# Patient Record
Sex: Female | Born: 1975
Health system: Southern US, Community
[De-identification: ages and names within clinical notes are randomized; demographics above are authoritative.]

## PROBLEM LIST (undated history)

## (undated) DIAGNOSIS — I639 Cerebral infarction, unspecified: Secondary | ICD-10-CM

---

## 2004-10-09 DIAGNOSIS — J45909 Unspecified asthma, uncomplicated: Secondary | ICD-10-CM | POA: Insufficient documentation

## 2020-01-26 ENCOUNTER — Emergency Department (HOSPITAL_COMMUNITY): Payer: Medicaid Other

## 2020-01-26 ENCOUNTER — Inpatient Hospital Stay (HOSPITAL_COMMUNITY): Payer: Medicaid Other

## 2020-01-26 ENCOUNTER — Inpatient Hospital Stay (HOSPITAL_COMMUNITY)
Admission: EM | Admit: 2020-01-26 | Discharge: 2020-01-30 | DRG: 065 | Disposition: A | Discharge status: Home / Self Care | Payer: Medicaid Other | Attending: Internal Medicine | Admitting: Internal Medicine

## 2020-01-26 ENCOUNTER — Other Ambulatory Visit (HOSPITAL_COMMUNITY): Payer: Self-pay

## 2020-01-26 ENCOUNTER — Encounter (HOSPITAL_COMMUNITY): Payer: Self-pay

## 2020-01-26 DIAGNOSIS — Z20822 Contact with and (suspected) exposure to covid-19: Secondary | ICD-10-CM | POA: Diagnosis present

## 2020-01-26 DIAGNOSIS — R29810 Facial weakness: Secondary | ICD-10-CM | POA: Diagnosis present

## 2020-01-26 DIAGNOSIS — F17210 Nicotine dependence, cigarettes, uncomplicated: Secondary | ICD-10-CM | POA: Diagnosis present

## 2020-01-26 DIAGNOSIS — R471 Dysarthria and anarthria: Secondary | ICD-10-CM | POA: Diagnosis not present

## 2020-01-26 DIAGNOSIS — G8194 Hemiplegia, unspecified affecting left nondominant side: Secondary | ICD-10-CM | POA: Diagnosis not present

## 2020-01-26 DIAGNOSIS — Z6835 Body mass index (BMI) 35.0-35.9, adult: Secondary | ICD-10-CM

## 2020-01-26 DIAGNOSIS — Z8249 Family history of ischemic heart disease and other diseases of the circulatory system: Secondary | ICD-10-CM

## 2020-01-26 DIAGNOSIS — R739 Hyperglycemia, unspecified: Secondary | ICD-10-CM | POA: Diagnosis present

## 2020-01-26 DIAGNOSIS — R531 Weakness: Secondary | ICD-10-CM

## 2020-01-26 DIAGNOSIS — G4733 Obstructive sleep apnea (adult) (pediatric): Secondary | ICD-10-CM | POA: Diagnosis present

## 2020-01-26 DIAGNOSIS — Z7982 Long term (current) use of aspirin: Secondary | ICD-10-CM

## 2020-01-26 DIAGNOSIS — E785 Hyperlipidemia, unspecified: Secondary | ICD-10-CM | POA: Diagnosis present

## 2020-01-26 DIAGNOSIS — I10 Essential (primary) hypertension: Secondary | ICD-10-CM | POA: Diagnosis not present

## 2020-01-26 DIAGNOSIS — E039 Hypothyroidism, unspecified: Secondary | ICD-10-CM | POA: Diagnosis present

## 2020-01-26 DIAGNOSIS — R569 Unspecified convulsions: Secondary | ICD-10-CM | POA: Diagnosis not present

## 2020-01-26 DIAGNOSIS — I63512 Cerebral infarction due to unspecified occlusion or stenosis of left middle cerebral artery: Secondary | ICD-10-CM | POA: Diagnosis not present

## 2020-01-26 DIAGNOSIS — Z833 Family history of diabetes mellitus: Secondary | ICD-10-CM

## 2020-01-26 DIAGNOSIS — Z720 Tobacco use: Secondary | ICD-10-CM | POA: Diagnosis not present

## 2020-01-26 DIAGNOSIS — R29709 NIHSS score 9: Secondary | ICD-10-CM | POA: Diagnosis not present

## 2020-01-26 DIAGNOSIS — E669 Obesity, unspecified: Secondary | ICD-10-CM | POA: Diagnosis present

## 2020-01-26 DIAGNOSIS — F17200 Nicotine dependence, unspecified, uncomplicated: Secondary | ICD-10-CM | POA: Diagnosis present

## 2020-01-26 DIAGNOSIS — E7800 Pure hypercholesterolemia, unspecified: Secondary | ICD-10-CM | POA: Diagnosis not present

## 2020-01-26 DIAGNOSIS — Z79899 Other long term (current) drug therapy: Secondary | ICD-10-CM

## 2020-01-26 DIAGNOSIS — R4701 Aphasia: Secondary | ICD-10-CM | POA: Diagnosis present

## 2020-01-26 DIAGNOSIS — Z8673 Personal history of transient ischemic attack (TIA), and cerebral infarction without residual deficits: Secondary | ICD-10-CM

## 2020-01-26 DIAGNOSIS — E6609 Other obesity due to excess calories: Secondary | ICD-10-CM | POA: Diagnosis not present

## 2020-01-26 DIAGNOSIS — I639 Cerebral infarction, unspecified: Secondary | ICD-10-CM

## 2020-01-26 DIAGNOSIS — I6389 Other cerebral infarction: Secondary | ICD-10-CM | POA: Diagnosis not present

## 2020-01-26 DIAGNOSIS — I6930 Unspecified sequelae of cerebral infarction: Secondary | ICD-10-CM | POA: Diagnosis present

## 2020-01-26 DIAGNOSIS — Z9114 Patient's other noncompliance with medication regimen: Secondary | ICD-10-CM

## 2020-01-26 HISTORY — DX: Cerebral infarction, unspecified: I63.9

## 2020-01-26 LAB — CBC
HCT: 46.2 % — ABNORMAL HIGH (ref 36.0–46.0)
Hemoglobin: 14.9 g/dL (ref 12.0–15.0)
MCH: 29 pg (ref 26.0–34.0)
MCHC: 32.3 g/dL (ref 30.0–36.0)
MCV: 90.1 fL (ref 80.0–100.0)
Platelets: 326 10*3/uL (ref 150–400)
RBC: 5.13 MIL/uL — ABNORMAL HIGH (ref 3.87–5.11)
RDW: 12.3 % (ref 11.5–15.5)
WBC: 6.1 10*3/uL (ref 4.0–10.5)
nRBC: 0 % (ref 0.0–0.2)

## 2020-01-26 LAB — COMPREHENSIVE METABOLIC PANEL
ALT: 20 U/L (ref 0–44)
AST: 20 U/L (ref 15–41)
Albumin: 4 g/dL (ref 3.5–5.0)
Alkaline Phosphatase: 101 U/L (ref 38–126)
Anion gap: 9 (ref 5–15)
BUN: 9 mg/dL (ref 6–20)
CO2: 25 mmol/L (ref 22–32)
Calcium: 9.9 mg/dL (ref 8.9–10.3)
Chloride: 105 mmol/L (ref 98–111)
Creatinine, Ser: 0.99 mg/dL (ref 0.44–1.00)
GFR calc Af Amer: >60 mL/min (ref >60)
GFR calc non Af Amer: >60 mL/min (ref >60)
Glucose, Bld: 124 mg/dL — ABNORMAL HIGH (ref 70–99)
Potassium: 4.1 mmol/L (ref 3.5–5.1)
Sodium: 139 mmol/L (ref 135–145)
Total Bilirubin: 0.4 mg/dL (ref 0.3–1.2)
Total Protein: 8 g/dL (ref 6.5–8.1)

## 2020-01-26 LAB — DIFFERENTIAL
Abs Immature Granulocytes: 0.02 10*3/uL (ref 0.00–0.07)
Basophils Absolute: 0 10*3/uL (ref 0.0–0.1)
Basophils Relative: 1 %
Eosinophils Absolute: 0.2 10*3/uL (ref 0.0–0.5)
Eosinophils Relative: 4 %
Immature Granulocytes: 0 %
Lymphocytes Relative: 39 %
Lymphs Abs: 2.4 10*3/uL (ref 0.7–4.0)
Monocytes Absolute: 0.3 10*3/uL (ref 0.1–1.0)
Monocytes Relative: 4 %
Neutro Abs: 3.2 10*3/uL (ref 1.7–7.7)
Neutrophils Relative %: 52 %

## 2020-01-26 LAB — CBG MONITORING, ED: Glucose-Capillary: 111 mg/dL — ABNORMAL HIGH (ref 70–99)

## 2020-01-26 LAB — SARS CORONAVIRUS 2 BY RT PCR (HOSPITAL ORDER, PERFORMED IN ~~LOC~~ HOSPITAL LAB): SARS Coronavirus 2: NEGATIVE

## 2020-01-26 LAB — APTT: aPTT: 28 seconds (ref 24–36)

## 2020-01-26 LAB — PROTIME-INR
INR: 1.1 (ref 0.8–1.2)
Prothrombin Time: 13.4 seconds (ref 11.4–15.2)

## 2020-01-26 LAB — I-STAT CHEM 8, ED
BUN: 11 mg/dL (ref 6–20)
Calcium, Ion: 1.11 mmol/L — ABNORMAL LOW (ref 1.15–1.40)
Chloride: 107 mmol/L (ref 98–111)
Creatinine, Ser: 1 mg/dL (ref 0.44–1.00)
Glucose, Bld: 119 mg/dL — ABNORMAL HIGH (ref 70–99)
HCT: 46 % (ref 36.0–46.0)
Hemoglobin: 15.6 g/dL — ABNORMAL HIGH (ref 12.0–15.0)
Potassium: 4.4 mmol/L (ref 3.5–5.1)
Sodium: 139 mmol/L (ref 135–145)
TCO2: 23 mmol/L (ref 22–32)

## 2020-01-26 LAB — I-STAT BETA HCG BLOOD, ED (MC, WL, AP ONLY): I-stat hCG, quantitative: <5 m[IU]/mL (ref <5)

## 2020-01-26 MED ORDER — STROKE: EARLY STAGES OF RECOVERY BOOK
Freq: Once | Status: AC
  Filled 2020-01-26: qty 1

## 2020-01-26 MED ORDER — ACETAMINOPHEN 325 MG PO TABS
650.0000 mg | ORAL_TABLET | ORAL | Status: DC | PRN
  Administered 2020-01-26 – 2020-01-28 (×3): 650 mg via ORAL
  Filled 2020-01-26 (×3): qty 2

## 2020-01-26 MED ORDER — SODIUM CHLORIDE 0.9% FLUSH
3.0000 mL | Freq: Once | INTRAVENOUS | Status: AC
  Administered 2020-01-27: 3 mL via INTRAVENOUS

## 2020-01-26 MED ORDER — LORAZEPAM 1 MG PO TABS
1.0000 mg | ORAL_TABLET | Freq: Once | ORAL | Status: AC
  Administered 2020-01-26: 1 mg via ORAL
  Filled 2020-01-26: qty 1

## 2020-01-26 MED ORDER — ASPIRIN 81 MG PO CHEW
81.0000 mg | CHEWABLE_TABLET | Freq: Every day | ORAL | Status: DC
  Administered 2020-01-27 – 2020-01-30 (×4): 81 mg via ORAL
  Filled 2020-01-26 (×5): qty 1

## 2020-01-26 MED ORDER — ENOXAPARIN SODIUM 40 MG/0.4ML ~~LOC~~ SOLN: 40.0000 mg | SUBCUTANEOUS | Status: DC

## 2020-01-26 MED ORDER — NICOTINE 14 MG/24HR TD PT24
14.0000 mg | MEDICATED_PATCH | Freq: Every day | TRANSDERMAL | Status: DC
  Administered 2020-01-27 – 2020-01-30 (×4): 14 mg via TRANSDERMAL
  Filled 2020-01-26 (×4): qty 1

## 2020-01-26 MED ORDER — ATORVASTATIN CALCIUM 80 MG PO TABS
80.0000 mg | ORAL_TABLET | Freq: Every day | ORAL | Status: DC
  Administered 2020-01-27: 80 mg via ORAL
  Filled 2020-01-26: qty 1

## 2020-01-26 MED ORDER — ACETAMINOPHEN 160 MG/5ML PO SOLN: 650.0000 mg | ORAL | Status: DC | PRN

## 2020-01-26 MED ORDER — ACETAMINOPHEN 650 MG RE SUPP: 650.0000 mg | RECTAL | Status: DC | PRN

## 2020-01-26 MED ORDER — IOHEXOL 350 MG/ML SOLN
100.0000 mL | Freq: Once | INTRAVENOUS | Status: AC | PRN
  Administered 2020-01-26: 100 mL via INTRAVENOUS

## 2020-01-26 MED ORDER — ASPIRIN 81 MG PO CHEW
324.0000 mg | CHEWABLE_TABLET | Freq: Once | ORAL | Status: AC
  Administered 2020-01-26: 324 mg via ORAL
  Filled 2020-01-26: qty 4

## 2020-01-26 NOTE — H&P (Signed)
Date: 01/26/2020               Patient Name:  Maria Porter MRN: [id number]  DOB: 28-Jan-1976 Age / Sex: 44 y.o., female   PCP: Patient, No Pcp Per         Medical Service: Internal Medicine Teaching Service         Attending Physician: Dr. Oswaldo Done, Marquita Palms, *    First Contact: Dr. Quincy Simmonds Pager: [phone]  Second Contact: Dr. Dellia Cloud Pager: [phone]       After Hours (After 5p/  First Contact Pager: [phone]  weekends / holidays): Second Contact Pager: [phone]   Chief Complaint: right sided weakness  History of Present Illness: [name] is a 44 year old female with history of prior stroke and ovarian tumor s/p resection presenting with acute onset right sided weakness and aphasia starting approximately four hours prior to presentation. She notes that she was in her usual state of health and was taking a shower when she had sudden onset of right sided weakness (upper > lower) with slurred speech and right sided facial droop. She denies any associated headache, vision changes, chest pain, dyspnea, gait instability or loss of balance.  She notes that symptoms persisted until evaluation in the ED when her aphasia started to improve, although notes some ongoing dysarthria and mild right upper extremity weakness.   Of note, patient notes similar episode approximately 4-5 years ago when she was living in [place], [place]. At the time, she was discharged with aspirin. She endorses ongoing left hand paresthesias from her prior stroke.   ED Course: Patient evaluated at code stroke and neurology was consulted. CT Head wo Contrast demonstrated subacute left frontal stroke with mild local mass effect without midline shift. Patient admitted for further stroke work up.   Meds:  Current Meds  Medication Sig  . albuterol (VENTOLIN HFA) 108 (90 Base) MCG/ACT inhaler Inhale 1-2 puffs into the lungs every 6 (six) hours as needed for wheezing or shortness of breath.  Marland Kitchen  aspirin EC 81 MG tablet Take 81 mg by mouth daily. Swallow whole.  . Black Elderberry,Berry-Flower, 575 MG CAPS Take 575 mg by mouth daily.  Marland Kitchen ibuprofen (ADVIL) 200 MG tablet Take 600 mg by mouth every 6 (six) hours as needed for headache or moderate pain.  . Multiple Vitamins-Minerals (MULTIVITAMIN WITH MINERALS) tablet Take 1 tablet by mouth daily.    Allergies: Allergies as of 01/26/2020  . (No Known Allergies)   Past Medical History:  Diagnosis Date  . Stroke [facility])     Family History:  Patient endorses family history of heart disease in both mother and father. Mother also has history of diabetes. Maternal and paternal grandmothers with history of stroke. Sister has history of hypertension.  Social History:  Patient works as an Public house manager. She is currently residing with her sister. She has significant history of smoking 1ppd for 30 years. She   Surgical History: Ovarian tumor removal; laterality unknown  Tubal ligation   Review of Systems: A complete ROS was negative except as per HPI.   Physical Exam: Blood pressure 121/89, pulse 63, temperature (!) 97.5 F (36.4 C), temperature source Oral, resp. rate 18, SpO2 99 %. Physical Exam  Constitutional: Appears well-developed and well-nourished. No distress.  HENT: Normocephalic and atraumatic, EOMI, conjunctiva normal, moist mucous membranes Cardiovascular: Normal rate, regular rhythm, S1 and S2 present, no murmurs, rubs, gallops.  Distal pulses intact Respiratory: No respiratory distress, no accessory  muscle use.  Effort is normal.  Lungs are clear to auscultation bilaterally. GI: Nondistended, soft, nontender to palpation, normal active bowel sounds Musculoskeletal: Normal bulk and tone.  No peripheral edema noted. Neurological: Mental Status: Patient is awake, alert, oriented x4 No signs of aphasia or neglect Cranial Nerves: II: Pupils equal, round, and reactive to light.   III,IV, VI: EOMI without ptosis or diploplia.  V:  Facial sensation diminished to light touch in V2 and V3 distribution on R side.  VII: Facial movement is symmetric.  VIII: hearing is intact to voice X: Uvula elevates symmetrically XI: Shoulder shrug is symmetric. XII: tongue is midline without atrophy or fasciculations.  Motor: 3/5 in RUE and 5/5 in LUE; 4/5 in RLE and 5/5 in LLE Sensory: Sensation is grossly intact bilateral UEs & LEs Cerebellar: Finger-Nose and Heel-Shin intact bilaterally; although slightly delayed on right than left.  Skin: Warm and dry.  No rash, erythema, lesions noted.  EKG: personally reviewed my interpretation is mild ST segment elevation in II and II and aVF; T wave inversions in aVR and aVL; QTc 436  CT HEAD WO CONTRAST:  IMPRESSION: 1. Findings concerning for subacute infarct involving the left frontal lobe, as detailed above. Mild local mass effect without midline shift. No hemorrhagic transformation.  CTA HEAD AND NECK AND PERFUSION WITH CONTRAST:  IMPRESSION: 1. Normal variant CTA Circle of Willis without significant proximal stenosis, aneurysm, or branch vessel occlusion. 2. Normal CTA of the neck. 3. CT perfusion is normal.  Assessment & Plan by Problem: Active Problems:   Acute CVA (cerebrovascular accident) [facility])  [name] 44 year old female with PMHx of prior L frontal CVA w/ongoing paresthesias in left hand, tobacco use disorder, and prior ovarian tumor s/p resection presenting with acute onset right sided weakness and aphasia admitted for stroke work up.  Acute CVA vs TIA:  Patient presents with acute onset right sided weakness and aphasia with right facial droop. CT Head wo contrast with subacute infarct of left frontal lobe without any further findings. She noted improvement in symptoms overall but continued to have some weakness in right upper and lower extremity on examination. Mildly diminished sensation in the right V2 and V3 segments without any other sensory deficits on  examination. Cerebellar examination wnl. Patient does have significant risk factors including prior stroke for which she is only on aspirin and tobacco use disorder.  - Neurology consulted, appreciate their recommendations - F/u MRI brain with MRA head and neck  - F/u Echo - Lipid panel and HbA1c  - Aspirin 81mg  daily - Permissive HTN in first 24 hours (goal SBP <220, DBP<110) - Cardiac monitoring - PT/OT/SLP evaluation  - F/u EEG   Hyperglycemia: No known history of diabetes. CBGs > 100 on admission - CBG monitoring and SSI - HbA1c  Tobacco use disorder:  Patient endorses 30 pack year smoking history. Discussed increased risk of stroke with smoking. Currently in precontemplative stage. - Nicotine patch - Continue to encourage smoking cessation  Hypertension: Patient not on any antihypertensives at home. Noted to have SBP 140-150sand DBP 90-110 during evaluation.  - Permissive HTN in first 24 hours given acute CVA - Goal is normotensive on discharge   Nonspecific EKG changes: Mild ST segment elevation and T wave inversions on EKG without prior EKG for comparison. No chest pain and HEART score of 3.Will hold off on getting troponins.  - Repeat EKG in AM  Diet: HH Fluids/Electrolytes: Monitor and replete prn DVT Prophylaxis: Lovenox  Code status: FULL   Dispo: Admit patient to Observation with expected length of stay less than 2 midnights.  Signed: Eliezer Bottom, MD  [facility] PGY-2 01/26/2020, 6:22 PM  Pager: [phone] After 5pm on weekdays and 1pm on weekends: On Call pager: [phone]

## 2020-01-26 NOTE — Procedures (Signed)
[name]  MRN: [id number]  Epilepsy Attending: Charlsie Quest  Referring Physician/Provider: Dr Erick Blinks Date: 01/26/2020 Duration: 25.22mins  Patient history: 44 y.o. female with PMH significant for prior L frontal stroke who presents with acute onset aphasia + R sided weakness. EEG to evaluate for seizure.  Level of alertness: Awake  AEDs during EEG study: None  Technical aspects: This EEG study was done with scalp electrodes positioned according to the 10-20 International system of electrode placement. Electrical activity was acquired at a sampling rate of 500Hz  and reviewed with a high frequency filter of 70Hz  and a low frequency filter of 1Hz . EEG data were recorded continuously and digitally stored.   Description: The posterior dominant rhythm consists of 8-9 Hz activity of moderate voltage (25-35 uV) seen predominantly in posterior head regions, symmetric and reactive to eye opening and eye closing.  EEG showed intermittent 3 to 6 Hz theta-delta slowing in left frontotemporal region.  Hyperventilation and photic stimulation were not performed.     ABNORMALITY -Intermittent slow, left frontotemporal region  IMPRESSION: This study is suggestive of cortical dysfunction in left frontotemporal region likely secondary to underlying stroke. No seizures or epileptiform discharges were seen throughout the recording.  Dash Cardarelli 

## 2020-01-26 NOTE — ED Notes (Signed)
Pt transported to MRI 

## 2020-01-26 NOTE — Code Documentation (Signed)
Patient from home where she lives with her sister. Sister saw patient in her normal state of health at 1215 and then went to eat lunch. Pt got in the shower and noticed right sided weakness and tingling in her arm and face. She says she was not able to get words out. Pt went to find sister and sister called 911. [facility] arrived and found pt globally aphasic, right sided hemiparesis, with a facial droop. Pt was taken to [facility] as a code stroke. Her initial NIHSS was a 9 (see documentation). She was cleared for CT. A CT/CTA/CTP were completed. During this time pt started to improve. Her NIHSS is now a 3 with some slurred speech and drift in the right upper and lower extremities. Pt is now able to verbally respond and follow all commands. She answers questions appropriately. Pt has had a stroke in the past with only numbness in her left hip as a remaining deficit. Because pt is rapidly improving it was decided not to give TPA. Her other scans were negative for any LVO's or bleeds per MD. Care Plan: q15 vitals, q30 mNIHSS until outside the TPA window at 1645. Hand off with Jeannett Senior, RN.

## 2020-01-26 NOTE — ED Notes (Addendum)
Received report from previous RN Hannie. Patient not in room at this time.

## 2020-01-26 NOTE — ED Notes (Signed)
Patient back from MRI.

## 2020-01-26 NOTE — Procedures (Signed)
  Please contact on call cardiology for stat echo to be done after hours.

## 2020-01-26 NOTE — ED Provider Notes (Addendum)
[facility] EMERGENCY DEPARTMENT Provider Note   CSN: [id number] Arrival date & time: 01/26/20  1354  An emergency department physician performed an initial assessment on this suspected stroke patient at 1356.  History Chief Complaint  Patient presents with  . Code Stroke    Maria Porter is a 44 y.o. female presenting today as a Code Stroke, via EMS. Per patient, she was taking a shower this afternoon around (last known well) 12:15 pm today when she noticed that her right side suddenly became weak, she was drooling on herself, was unable to speak clearly, and had numbness on her right side. She denies LOC, did not fall while in the shower; she was able to get out of the shower and get her daughter's attention. She states she has been feeling well otherwise.  She denies HA, chest pain, SOB, visual changes or loss of vision.   Her sister is at the bedside. Patient reports that she had a similar episode 4 years ago while living in [place], [place]. She states that at that time she was told she had no blood clots in her brain, and did not have any lingering deficits afterward. She was discharged home with instruction to take aspirin daily, which she has done.  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 She is new to the [place] area; she also just moved into her own apartment. According to history offered by patient's sister at bedside, patient was also under increased stress at the time of her prior episode.    She reports she is feeling much better at this time. She is alert and appropriate, speech remains mildly slurred.   HPI     Past Medical History:  Diagnosis Date  . Stroke [facility])     There are no problems to display for this patient.   History reviewed. No pertinent surgical history.   OB History   No obstetric history on file.     No family history on file.  Social History   Tobacco Use  .  Smoking status: Not on file  Substance Use Topics  . Alcohol use: Not on file  . Drug use: Not on file    Home Medications Prior to Admission medications   Medication Sig Start Date End Date Taking? Authorizing Provider  albuterol (VENTOLIN HFA) 108 (90 Base) MCG/ACT inhaler Inhale 1-2 puffs into the lungs every 6 (six) hours as needed for wheezing or shortness of breath.   Yes [provider]  aspirin EC 81 MG tablet Take 81 mg by mouth daily. Swallow whole.   Yes [provider]  Black Elderberry,Berry-Flower, 575 MG CAPS Take 575 mg by mouth daily.   Yes [provider]  ibuprofen (ADVIL) 200 MG tablet Take 600 mg by mouth every 6 (six) hours as needed for headache or moderate pain.   Yes [provider]  Multiple Vitamins-Minerals (MULTIVITAMIN WITH MINERALS) tablet Take 1 tablet by mouth daily.   Yes [provider]    Allergies    Patient has no known allergies.  Review of Systems   Review of Systems  Constitutional: Negative for chills and fever.  HENT: Positive for drooling. Negative for ear pain, hearing loss, trouble swallowing and voice change.   Eyes: Negative for photophobia, pain and visual disturbance.  Respiratory: Negative for cough, chest tightness and shortness of breath.   Cardiovascular: Negative for chest pain, palpitations and leg swelling.  Gastrointestinal:  Negative for abdominal pain, nausea and vomiting.  Genitourinary: Negative for dysuria and hematuria.  Musculoskeletal: Positive for gait problem. Negative for arthralgias and back pain.  Skin: Negative for color change and rash.  Neurological: Positive for speech difficulty and weakness. Negative for dizziness, seizures, syncope, light-headedness and headaches.  Psychiatric/Behavioral: Negative for confusion.  All other systems reviewed and are negative.   Physical Exam Updated Vital Signs BP (!) 140/93   Pulse 72   Temp (!) 97.5 F (36.4 C) (Oral)    Resp 19   SpO2 99%   Physical Exam Vitals and nursing note reviewed.  HENT:     Head: Normocephalic and atraumatic.     Mouth/Throat:     Mouth: Mucous membranes are moist.     Pharynx: No oropharyngeal exudate or posterior oropharyngeal erythema.  Eyes:     General: Vision grossly intact. No visual field deficit.       Right eye: No discharge.        Left eye: No discharge.     Extraocular Movements: Extraocular movements intact.     Conjunctiva/sclera: Conjunctivae normal.     Pupils: Pupils are equal, round, and reactive to light.     Visual Fields: Right eye visual fields normal and left eye visual fields normal.  Neck:     Comments: Mild slurring of speech Cardiovascular:     Rate and Rhythm: Normal rate and regular rhythm.     Pulses: Normal pulses.     Heart sounds: Normal heart sounds.  Pulmonary:     Effort: Pulmonary effort is normal. No respiratory distress.     Breath sounds: Normal breath sounds. No wheezing or rales.  Abdominal:     General: Bowel sounds are normal. There is no distension.     Palpations: Abdomen is soft.     Tenderness: There is no abdominal tenderness.  Musculoskeletal:        General: No deformity.     Cervical back: Neck supple.     Right lower leg: No edema.     Left lower leg: No edema.  Skin:    General: Skin is warm and dry.     Capillary Refill: Capillary refill takes less than 2 seconds.  Neurological:     Mental Status: She is alert and oriented to person, place, and time.     Cranial Nerves: Cranial nerves are intact.     Sensory: Sensation is intact.     Motor: Weakness present.     Coordination: Coordination abnormal.     Comments: Delay in finger to nose exam on the right; left intact.  Delay in heel to shin exam with right leg; left intact.  Strength asymmetric R>L.  RUE 5/5 strength, RLE 5/5 strength Mild left sided weakness.LUE 4/5 strength, LLE 4/5 strength  Romberg and pronator drift not performed as patient  presented with left sided weakness  Psychiatric:        Mood and Affect: Mood normal.     ED Results / Procedures / Treatments   Labs (all labs ordered are listed, but only abnormal results are displayed) Labs Reviewed  CBC - Abnormal; Notable for the following components:      Result Value   RBC 5.13 (*)    HCT 46.2 (*)    All other components within normal limits  COMPREHENSIVE METABOLIC PANEL - Abnormal; Notable for the following components:   Glucose, Bld 124 (*)    All other components within normal limits  I-STAT  CHEM 8, ED - Abnormal; Notable for the following components:   Glucose, Bld 119 (*)    Calcium, Ion 1.11 (*)    Hemoglobin 15.6 (*)    All other components within normal limits  CBG MONITORING, ED - Abnormal; Notable for the following components:   Glucose-Capillary 111 (*)    All other components within normal limits  SARS CORONAVIRUS 2 BY RT PCR (HOSPITAL ORDER, PERFORMED IN [facility])  PROTIME-INR  APTT  DIFFERENTIAL  I-STAT BETA HCG BLOOD, ED ([facility], [facility], [facility] ONLY)    EKG None  Radiology CT CEREBRAL PERFUSION W CONTRAST  Result Date: 01/26/2020 CLINICAL DATA:  Neuro deficit, acute, stroke suspected. EXAM: CT ANGIOGRAPHY HEAD AND NECK CT PERFUSION BRAIN TECHNIQUE: Multidetector CT imaging of the head and neck was performed using the standard protocol during bolus administration of intravenous contrast. Multiplanar CT image reconstructions and MIPs were obtained to evaluate the vascular anatomy. Carotid stenosis measurements (when applicable) are obtained utilizing NASCET criteria, using the distal internal carotid diameter as the denominator. Multiphase CT imaging of the brain was performed following IV bolus contrast injection. Subsequent parametric perfusion maps were calculated using RAPID software. CONTRAST:  OMNIPAQUE IOHEXOL 350 MG/ML SOLN COMPARISON:  CT head without contrast 01/26/2020 FINDINGS: CTA NECK FINDINGS Aortic arch: Common  origin of the left common carotid artery and innominate artery is noted. No significant stenosis or vascular disease present the arch or great vessel origins. Right carotid system: Right common carotid artery is within normal limits. Bifurcation is unremarkable. Cervical right ICA is within normal limits. Left carotid system: Of the left common carotid artery is within normal limits. Bifurcation is unremarkable. Cervical left ICA is normal. Vertebral arteries: The right vertebral artery is the dominant vessel. Both vertebral arteries originate from the subclavians without significant stenosis. No significant stenosis is present in either vertebral artery in the neck. Skeleton: There is some straightening of the normal cervical lordosis. No significant stenosis is present in either vertebral artery in the neck. Other neck: Soft tissues the neck are otherwise unremarkable. No significant adenopathy is present. Thyroid is normal. Salivary glands are within normal limits. Upper chest: Mild the lung apices are clear. Thoracic inlet is within normal limits. Review of the MIP images confirms the above findings CTA HEAD FINDINGS Anterior circulation: The internal carotid arteries are within normal limits from the high cervical segments through the ICA termini scratched at the internal carotid arteries are within normal limits from the skull base through the ICA termini. The A1 and M1 segments are normal. The anterior communicating artery is patent. The MCA bifurcations are intact. The ACA and MCA branch vessels are within normal limits. Posterior circulation: Right vertebral artery is the dominant left Sol. PICA origins are visualized and normal. Basilar artery is within normal limits. The right posterior cerebral artery arises from the basilar tip. The left posterior cerebral artery is of fetal type. PCA branch vessels are within normal limits bilaterally. Venous sinuses: The dural sinuses are patent. The straight sinus  and deep cerebral veins are intact. Cortical veins are unremarkable. Anatomic variants: Fetal type left posterior cerebral artery. Review of the MIP images confirms the above findings CT Brain Perfusion Findings: ASPECTS: 9/10 CBF (<30%) Volume: 22mL Perfusion (Tmax>6.0s) volume: 18mL Mismatch Volume: 63mL Infarction Location:N/A IMPRESSION: 1. Normal variant CTA Circle of Willis without significant proximal stenosis, aneurysm, or branch vessel occlusion. 2. Normal CTA of the neck. 3. CT perfusion is normal. Electronically Signed   By: Cristal Deer  Mattern M.D.   On: 01/26/2020 14:54   CT HEAD CODE STROKE WO CONTRAST  Result Date: 01/26/2020 CLINICAL DATA:  Code stroke. Neuro deficit, acute, stroke suspected. EXAM: CT HEAD WITHOUT CONTRAST TECHNIQUE: Contiguous axial images were obtained from the base of the skull through the vertex without intravenous contrast. COMPARISON:  None. FINDINGS: Brain: Area of cytotoxic edema with loss of gray-white differentiation involving the anterior left frontal lobe. Mild associated local mass effect with sulcal effacement. No midline shift. No evidence of acute hemorrhage, hydrocephalus, extra-axial collection or mass lesion/mass effect. Vascular: Calcific atherosclerosis. Skull: Normal. Negative for fracture or focal lesion. Sinuses/Orbits: No acute finding. Other: None. ASPECTS [facility] Stroke Program Early CT Score) - Ganglionic level infarction (caudate, lentiform nuclei, internal capsule, insula, M1-M3 cortex): 7 - Supraganglionic infarction (M4-M6 cortex): 2 Total score (0-10 with 10 being normal): 9 IMPRESSION: 1. Findings concerning for subacute infarct involving the left frontal lobe, as detailed above. Mild local mass effect without midline shift. No hemorrhagic transformation. 2. ASPECTS is 9 Code stroke imaging results were communicated on 01/26/2020 at 2:31 pm to provider Dr. Ezzie Dural via telephone, who verbally acknowledged these results. Electronically Signed   By:  Feliberto Harts MD   On: 01/26/2020 14:35   CT ANGIO HEAD CODE STROKE  Result Date: 01/26/2020 CLINICAL DATA:  Neuro deficit, acute, stroke suspected. EXAM: CT ANGIOGRAPHY HEAD AND NECK CT PERFUSION BRAIN TECHNIQUE: Multidetector CT imaging of the head and neck was performed using the standard protocol during bolus administration of intravenous contrast. Multiplanar CT image reconstructions and MIPs were obtained to evaluate the vascular anatomy. Carotid stenosis measurements (when applicable) are obtained utilizing NASCET criteria, using the distal internal carotid diameter as the denominator. Multiphase CT imaging of the brain was performed following IV bolus contrast injection. Subsequent parametric perfusion maps were calculated using RAPID software. CONTRAST:  OMNIPAQUE IOHEXOL 350 MG/ML SOLN COMPARISON:  CT head without contrast 01/26/2020 FINDINGS: CTA NECK FINDINGS Aortic arch: Common origin of the left common carotid artery and innominate artery is noted. No significant stenosis or vascular disease present the arch or great vessel origins. Right carotid system: Right common carotid artery is within normal limits. Bifurcation is unremarkable. Cervical right ICA is within normal limits. Left carotid system: Of the left common carotid artery is within normal limits. Bifurcation is unremarkable. Cervical left ICA is normal. Vertebral arteries: The right vertebral artery is the dominant vessel. Both vertebral arteries originate from the subclavians without significant stenosis. No significant stenosis is present in either vertebral artery in the neck. Skeleton: There is some straightening of the normal cervical lordosis. No significant stenosis is present in either vertebral artery in the neck. Other neck: Soft tissues the neck are otherwise unremarkable. No significant adenopathy is present. Thyroid is normal. Salivary glands are within normal limits. Upper chest: Mild the lung apices are clear.  Thoracic inlet is within normal limits. Review of the MIP images confirms the above findings CTA HEAD FINDINGS Anterior circulation: The internal carotid arteries are within normal limits from the high cervical segments through the ICA termini scratched at the internal carotid arteries are within normal limits from the skull base through the ICA termini. The A1 and M1 segments are normal. The anterior communicating artery is patent. The MCA bifurcations are intact. The ACA and MCA branch vessels are within normal limits. Posterior circulation: Right vertebral artery is the dominant left Sol. PICA origins are visualized and normal. Basilar artery is within normal limits. The right posterior cerebral artery  arises from the basilar tip. The left posterior cerebral artery is of fetal type. PCA branch vessels are within normal limits bilaterally. Venous sinuses: The dural sinuses are patent. The straight sinus and deep cerebral veins are intact. Cortical veins are unremarkable. Anatomic variants: Fetal type left posterior cerebral artery. Review of the MIP images confirms the above findings CT Brain Perfusion Findings: ASPECTS: 9/10 CBF (<30%) Volume: 40mL Perfusion (Tmax>6.0s) volume: 32mL Mismatch Volume: 94mL Infarction Location:N/A IMPRESSION: 1. Normal variant CTA Circle of Willis without significant proximal stenosis, aneurysm, or branch vessel occlusion. 2. Normal CTA of the neck. 3. CT perfusion is normal. Electronically Signed   By: Marin Roberts M.D.   On: 01/26/2020 14:54   CT ANGIO NECK CODE STROKE  Result Date: 01/26/2020 CLINICAL DATA:  Neuro deficit, acute, stroke suspected. EXAM: CT ANGIOGRAPHY HEAD AND NECK CT PERFUSION BRAIN TECHNIQUE: Multidetector CT imaging of the head and neck was performed using the standard protocol during bolus administration of intravenous contrast. Multiplanar CT image reconstructions and MIPs were obtained to evaluate the vascular anatomy. Carotid stenosis measurements  (when applicable) are obtained utilizing NASCET criteria, using the distal internal carotid diameter as the denominator. Multiphase CT imaging of the brain was performed following IV bolus contrast injection. Subsequent parametric perfusion maps were calculated using RAPID software. CONTRAST:  OMNIPAQUE IOHEXOL 350 MG/ML SOLN COMPARISON:  CT head without contrast 01/26/2020 FINDINGS: CTA NECK FINDINGS Aortic arch: Common origin of the left common carotid artery and innominate artery is noted. No significant stenosis or vascular disease present the arch or great vessel origins. Right carotid system: Right common carotid artery is within normal limits. Bifurcation is unremarkable. Cervical right ICA is within normal limits. Left carotid system: Of the left common carotid artery is within normal limits. Bifurcation is unremarkable. Cervical left ICA is normal. Vertebral arteries: The right vertebral artery is the dominant vessel. Both vertebral arteries originate from the subclavians without significant stenosis. No significant stenosis is present in either vertebral artery in the neck. Skeleton: There is some straightening of the normal cervical lordosis. No significant stenosis is present in either vertebral artery in the neck. Other neck: Soft tissues the neck are otherwise unremarkable. No significant adenopathy is present. Thyroid is normal. Salivary glands are within normal limits. Upper chest: Mild the lung apices are clear. Thoracic inlet is within normal limits. Review of the MIP images confirms the above findings CTA HEAD FINDINGS Anterior circulation: The internal carotid arteries are within normal limits from the high cervical segments through the ICA termini scratched at the internal carotid arteries are within normal limits from the skull base through the ICA termini. The A1 and M1 segments are normal. The anterior communicating artery is patent. The MCA bifurcations are intact. The ACA and MCA  branch vessels are within normal limits. Posterior circulation: Right vertebral artery is the dominant left Sol. PICA origins are visualized and normal. Basilar artery is within normal limits. The right posterior cerebral artery arises from the basilar tip. The left posterior cerebral artery is of fetal type. PCA branch vessels are within normal limits bilaterally. Venous sinuses: The dural sinuses are patent. The straight sinus and deep cerebral veins are intact. Cortical veins are unremarkable. Anatomic variants: Fetal type left posterior cerebral artery. Review of the MIP images confirms the above findings CT Brain Perfusion Findings: ASPECTS: 9/10 CBF (<30%) Volume: 67mL Perfusion (Tmax>6.0s) volume: 72mL Mismatch Volume: 43mL Infarction Location:N/A IMPRESSION: 1. Normal variant CTA Circle of Willis without significant proximal stenosis, aneurysm,  or branch vessel occlusion. 2. Normal CTA of the neck. 3. CT perfusion is normal. Electronically Signed   By: Marin Robertshristopher  Mattern M.D.   On: 01/26/2020 14:54    Procedures Procedures (including critical care time)  Medications Ordered in ED Medications  sodium chloride flush (NS) 0.9 % injection 3 mL (has no administration in time range)  aspirin chewable tablet 324 mg (has no administration in time range)  aspirin chewable tablet 81 mg (has no administration in time range)  iohexol (OMNIPAQUE) 350 MG/ML injection 100 mL (100 mLs Intravenous Contrast Given 01/26/20 1435)    ED Course  I have reviewed the triage vital signs and the nursing notes.  Pertinent labs & imaging results that were available during my care of the patient were reviewed by me and considered in my medical decision making (see chart for details).    MDM Rules/Calculators/A&P                            Patient seen in conjunction with supervising physician as a level 1 Code Stroke.   Last known well at 12:15 pm today. Sudden onset right sided weakness, numbness, slurred  speech, facial droop while in the shower. No LOC, HA, visual changes. Patient alert and oriented.   History of similar episode 4 years prior - on ASA daily.   CTA Head and neck, Cerebral perfusion - without acute abnormality.  CT Head code stroke: concerning for subacute infarct involving left frontal lobe, mild local mass effect, no midline shift  CBC, CMP unremarkable. I-stat chem 8, mild hypocalcemia of 1.11. Protime-INR normal.   On exam of the patient, she is alert and oriented. Mild delay of coordination on the right upper and lower extremities, mild left-sided weakness. Mild slurring of speech remains.   Patient states that she is feeling much better, her deficits appear to be improving on their own.   Vital signs remarkable for mild hypertension of 140/93, otherwise normal.   Per neurologist, patient not a candidate for TPA or EVT.   Supervising physician consulted with neurologist who recommended admission to medicine service for inpatient completion of stroke workup.   Patient and family informed of CT results; agreeable to inpatient admission and further evaluation.   Marney DoctorMz. Chinchilla voiced understanding of the plan and each of her questions were answered to her expressed satisfaction.    Care of patient signed out to [facility] at shift change.    Final Clinical Impression(s) / ED Diagnoses Final diagnoses:  None    Rx / DC Orders ED Discharge Orders    None       Paris LoreSponseller, Estefana Taylor R, PA-C 01/26/20 1601    Armin Yerger, Eugene GaviaRebekah R, PA-C 01/26/20 1603    Arby BarrettePfeiffer, Marcy, MD 02/07/20 1210

## 2020-01-26 NOTE — ED Notes (Addendum)
EEG team in room, unable to complete modified NIHSS d/t procedure. Will continue when EEG is completed

## 2020-01-26 NOTE — ED Notes (Signed)
Daughter at bedside, brought patient [employer] salad. Pt provided with warm tea per her request. Pt and daughter updated on plan of care, verbalized understanding. Denies further needs.

## 2020-01-26 NOTE — ED Notes (Signed)
Patient provided with sandwich bag, did not like anything on the tray. Patient feeding self without difficulty.

## 2020-01-26 NOTE — ED Triage Notes (Signed)
Pt bib gcems w/ c/o expressive aphasia, R sided weakness, and R sided facial droop. LKW 1215, CODE STROKE. EMS VSS.

## 2020-01-26 NOTE — Progress Notes (Signed)
EEG complete - results pending 

## 2020-01-26 NOTE — ED Provider Notes (Signed)
Medical screening examination/treatment/procedure(s) were conducted as a shared visit with non-physician practitioner(s) and myself.  I personally evaluated the patient during the encounter.    Patient reports that she has been feeling well.  She was taking a shower when she very suddenly got weak on the right side of her body.  She could tell that her speech was slurred and she was drooling from the right side of her mouth.  She reports she could not use her right arm.  She did not fall to the ground.  She did not perceive that her right leg gave out from under her.  No headache.  No chest pain.  Patient denies any visual changes.  No loss of vision that she is aware of.  Symptoms started at 12:15 PM.  At this time, she reports she has improved quite a bit.  He does report a similar episode while living in [place] about 4 5 years ago.  She reports she was evaluated at the hospital told she did not have any clots.  She reports she was discharged on daily aspirin.  Patient is alert and appropriate.  Speech is mildly slurred.  No respiratory distress.  Heart regular.  Lungs clear.  No cranial nerve deficits.  Visual fields intact.  Subtle delay in finger-nose exam on the right as composed of the left.  Patient also seems slightly slower to perform heel shin on the right as opposed to the left.  Slight motor weakness left upper extremity 4\5, slight motor weakness lower extremity 4\5.  Patient is seen as code stroke.  Neurologist reviewing diagnostic studies.  Patient is showing improvement.  Definitive management per neurology recommendation.  Airway stable.  Blood pressure stable.  Heart regular.   Arby Barrette, MD 01/26/20 1500

## 2020-01-26 NOTE — ED Notes (Signed)
Patient and daughter updated on plan of care, verbalized understanding. Daughter: Alizeh Madril - [phone]. Sister - Drue Flirt [phone]. Okay to speak with both family members per patient.

## 2020-01-26 NOTE — Consult Note (Signed)
NEUROLOGY CONSULTATION NOTE   Date of service: January 26, 2020 [name] MRN:  [id number] DOB:  1976/03/20 Reason for consult: "Stroke code for R sided weakness and aphasia"  History of Present Illness  Maria Porter is a 44 y.o. female with PMH significant for prior L frontal stroke who presents with acute onset aphasia + R sided weakness. Patient was at home, was last seen by her sister who lives with her at her baseline at 1215 on 01/26/20. The sister then left for lunch and came back an hour later and found her at 1315 with deficits.  She was brought in to the ED where [facility] demonstrates an old L frontal infarct. CTA was negative for an LVO, CTP with no mismatch. Her symptoms started improving in the ED with resolution of aphasia, improved R sided weakness with a drift now.  Patient very overwhelmed. 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 She was in the shower when she had acute osnet numbness of her R face and R arm and she could not speak. She was freaked out.  She is supposed to be on aspirin but does not take it daily.  NIHSS: initial 9, improved to a 3 for dysarthria, RUE and RLE drift. Premorbid mRS: 0 TPA: Not given due to rapidly improving symptoms. Thrombectomy: No LVO observed.   ROS   Constitutional Denies weight loss, fever and chills.  HEENT Denies changes in vision and hearing.  Respiratory Denies SOB and cough.  CV Denies palpitations and CP  GI Denies abdominal pain, nausea, vomiting and diarrhea.  GU Denies dysuria and urinary frequency.  MSK Denies myalgia and joint pain.  Skin Denies rash and pruritus.  Neurological Denies headache and syncope.  Psychiatric Denies recent changes in mood. Denies anxiety and depression.   Past History   Past Medical History:  Diagnosis Date  . Stroke [facility])    History reviewed. No pertinent surgical history. No family history on file. Social History   Socioeconomic History  . Marital status: Single     Spouse name: Not on file  . Number of children: Not on file  . Years of education: Not on file  . Highest education level: Not on file  Occupational History  . Not on file  Tobacco Use  . Smoking status: Not on file  Substance and Sexual Activity  . Alcohol use: Not on file  . Drug use: Not on file  . Sexual activity: Not on file  Other Topics Concern  . Not on file  Social History Narrative  . Not on file   Social Determinants of Health   Financial Resource Strain:   . Difficulty of Paying Living Expenses: Not on file  Food Insecurity:   . Worried About Programme researcher, broadcasting/film/video in the Last Year: Not on file  . Ran Out of Food in the Last Year: Not on file  Transportation Needs:   . Lack of Transportation (Medical): Not on file  . Lack of Transportation (Non-Medical): Not on file  Physical Activity:   . Days of Exercise per Week: Not on file  . Minutes of Exercise per Session: Not on file  Stress:   . Feeling of Stress : Not on file  Social Connections:   . Frequency of Communication with Friends and Family: Not on file  . Frequency of Social Gatherings with Friends and Family: Not on file  . Attends Religious Services: Not on file  . Active Member of Clubs or  Organizations: Not on file  . Attends Banker Meetings: Not on file  . Marital Status: Not on file   Not on File  Medications  (Not in a hospital admission)    Vitals  Pulse Rate:  [79] 79 (09/21 1432) Resp:  [23] 23 (09/21 1432) BP: (141)/(95) 141/95 (09/21 1431) SpO2:  [100 %] 100 % (09/21 1432)  There is no height or weight on file to calculate BMI.  Physical Exam   General: Laying comfortably in bed; in no acute distress.  HENT: Normal oropharynx and mucosa. Normal external appearance of ears and nose. Neck: Supple, no pain or tenderness CV: No JVD. No peripheral edema. Pulmonary: Symmetric Chest rise. Normal respiratory effort. Abdomen: Soft to touch, non-tender Ext: No cyanosis, edema,  or deformity  Skin: No rash. Normal palpation of skin.   Musculoskeletal: Normal digits and nails by inspection. No clubbing.  Neurologic Examination  Mental status/Cognition: Alert, oriented to self, place, month and year, good attention. Speech/language: Dysarthric, Fluent, comprehension intact, object naming intact, repetition intact. Cranial nerves:   CN II Pupils equal and reactive to light, no VF deficits   CN III,IV,VI EOM intact, no gaze preference or deviation, no nystagmus   CN V normal sensation in V1, V2, and V3 segments bilaterally   CN VII no asymmetry, no nasolabial fold flattening   CN VIII normal hearing to speech   CN IX & X normal palatal elevation, no uvular deviation   CN XI 5/5 head turn and 5/5 shoulder shrug bilaterally   CN XII midline tongue protrusion   Motor:  Muscle bulk: normal, tone normal, pronator drift noted in RUE.  RUE: 4/5 LUE: 5/5 RLE: 4/5 LLE: 5/5.   Reflexes:  Right Left Comments  Pectoralis      Biceps (C5/6) 1 1   Brachioradialis (C5/6) 1 1    Triceps (C6/7) 1 1    Patellar (L3/4) 1 1    Achilles (S1) 1 1    Hoffman      Plantar     Jaw jerk    Sensation:  Light touch Intact BL   Pin prick    Temperature    Vibration   Proprioception    Coordination/Complex Motor:  - Finger to Nose intact BL - Gait: Deferred.  Labs   Lab Results  Component Value Date   NA 139 01/26/2020   K 4.4 01/26/2020   CL 107 01/26/2020   CO2 25 01/26/2020   GLUCOSE 119 (H) 01/26/2020   BUN 11 01/26/2020   CREATININE 1.00 01/26/2020   CALCIUM 9.9 01/26/2020   ALBUMIN 4.0 01/26/2020   AST 20 01/26/2020   ALT 20 01/26/2020   ALKPHOS 101 01/26/2020   BILITOT 0.4 01/26/2020   GFRNONAA >60 01/26/2020   GFRAA >60 01/26/2020     Imaging and Diagnostic studies  CTH without contrast: CTH was negative for a large hypodensity concerning for a large territory infarct or hyperdensity concerning for an ICH. Noted prior L Frontal ischemic  stroke.  CTA head and neck: No LVO, fetal L PCA.  CTP: No mismatch  Impression   Maria Porter is a 44 y.o. female with PMH significant for prior L frontal stroke who presents with acute onset aphasia + R sided weakness. Symptoms improved in the ED with resolution of aphasia, improved strength in RUE and RLE. tPA not given due to rapidly imporving symptoms, no LVO and therefore not a candidate for thrombectomy. Suspect that this was either a TIA/minor stroke  or a focal seizure. The duration of the symptoms is more suggestive of a TIA.  Recommendations  - Neuro checks and NIHSS per floor protocol - Recommend Brain imaging with MRI Brain without contrast. - Recommend TTE - Recommend Lipid panel - Recommend HbA1C - Antithrombotic - I ordered aspirin 325 today and aspirin 81mg  daily starting tomorrow. - SBP goal - permissive hypertension first 24 h < 220/110. Held home meds.  - Telemetry monitoring for arrythmia - Swallow screen - Stroke education. - PT/OT/SLP consult - I ordered a routine EEG - I ordered seizure precautions.  ______________________________________________________________________   Thank you for the opportunity to take part in the care of this patient. If you have any further questions, please contact the neurology consultation attending.  Signed,  [facility] Pager Number [phone]

## 2020-01-27 ENCOUNTER — Inpatient Hospital Stay (HOSPITAL_COMMUNITY): Payer: Medicaid Other

## 2020-01-27 ENCOUNTER — Encounter (HOSPITAL_COMMUNITY): Payer: Self-pay | Admitting: Student in an Organized Health Care Education/Training Program

## 2020-01-27 ENCOUNTER — Other Ambulatory Visit: Payer: Self-pay

## 2020-01-27 DIAGNOSIS — I6389 Other cerebral infarction: Secondary | ICD-10-CM

## 2020-01-27 DIAGNOSIS — E669 Obesity, unspecified: Secondary | ICD-10-CM | POA: Diagnosis present

## 2020-01-27 DIAGNOSIS — E7800 Pure hypercholesterolemia, unspecified: Secondary | ICD-10-CM

## 2020-01-27 DIAGNOSIS — I639 Cerebral infarction, unspecified: Secondary | ICD-10-CM

## 2020-01-27 DIAGNOSIS — Z6835 Body mass index (BMI) 35.0-35.9, adult: Secondary | ICD-10-CM

## 2020-01-27 DIAGNOSIS — F17200 Nicotine dependence, unspecified, uncomplicated: Secondary | ICD-10-CM

## 2020-01-27 DIAGNOSIS — Z720 Tobacco use: Secondary | ICD-10-CM

## 2020-01-27 DIAGNOSIS — Z8673 Personal history of transient ischemic attack (TIA), and cerebral infarction without residual deficits: Secondary | ICD-10-CM

## 2020-01-27 DIAGNOSIS — E6609 Other obesity due to excess calories: Secondary | ICD-10-CM

## 2020-01-27 DIAGNOSIS — E785 Hyperlipidemia, unspecified: Secondary | ICD-10-CM | POA: Diagnosis present

## 2020-01-27 LAB — ECHOCARDIOGRAM COMPLETE
Area-P 1/2: 3.03 cm2
S' Lateral: 2.4 cm

## 2020-01-27 LAB — CBC
HCT: 42.5 % (ref 36.0–46.0)
Hemoglobin: 13.8 g/dL (ref 12.0–15.0)
MCH: 29.1 pg (ref 26.0–34.0)
MCHC: 32.5 g/dL (ref 30.0–36.0)
MCV: 89.7 fL (ref 80.0–100.0)
Platelets: 318 10*3/uL (ref 150–400)
RBC: 4.74 MIL/uL (ref 3.87–5.11)
RDW: 12.1 % (ref 11.5–15.5)
WBC: 8.1 10*3/uL (ref 4.0–10.5)
nRBC: 0 % (ref 0.0–0.2)

## 2020-01-27 LAB — COMPREHENSIVE METABOLIC PANEL
ALT: 18 U/L (ref 0–44)
AST: 20 U/L (ref 15–41)
Albumin: 3.6 g/dL (ref 3.5–5.0)
Alkaline Phosphatase: 89 U/L (ref 38–126)
Anion gap: 11 (ref 5–15)
BUN: 12 mg/dL (ref 6–20)
CO2: 23 mmol/L (ref 22–32)
Calcium: 9.3 mg/dL (ref 8.9–10.3)
Chloride: 104 mmol/L (ref 98–111)
Creatinine, Ser: 1.03 mg/dL — ABNORMAL HIGH (ref 0.44–1.00)
GFR calc Af Amer: >60 mL/min (ref >60)
GFR calc non Af Amer: >60 mL/min (ref >60)
Glucose, Bld: 120 mg/dL — ABNORMAL HIGH (ref 70–99)
Potassium: 3.7 mmol/L (ref 3.5–5.1)
Sodium: 138 mmol/L (ref 135–145)
Total Bilirubin: 0.3 mg/dL (ref 0.3–1.2)
Total Protein: 7 g/dL (ref 6.5–8.1)

## 2020-01-27 LAB — VITAMIN B12: Vitamin B-12: 332 pg/mL (ref 180–914)

## 2020-01-27 LAB — LIPID PANEL
Cholesterol: 181 mg/dL (ref 0–200)
HDL: 42 mg/dL (ref >40)
LDL Cholesterol: 125 mg/dL — ABNORMAL HIGH (ref 0–99)
Total CHOL/HDL Ratio: 4.3 RATIO
Triglycerides: 69 mg/dL (ref <150)
VLDL: 14 mg/dL (ref 0–40)

## 2020-01-27 LAB — HEMOGLOBIN A1C
Hgb A1c MFr Bld: 5.8 % — ABNORMAL HIGH (ref 4.8–5.6)
Mean Plasma Glucose: 119.76 mg/dL

## 2020-01-27 LAB — C-REACTIVE PROTEIN: CRP: 0.6 mg/dL (ref <1.0)

## 2020-01-27 LAB — T4, FREE: Free T4: 0.67 ng/dL (ref 0.61–1.12)

## 2020-01-27 LAB — SEDIMENTATION RATE: Sed Rate: 17 mm/hr (ref 0–22)

## 2020-01-27 LAB — ANTITHROMBIN III: AntiThromb III Func: 112 % (ref 75–120)

## 2020-01-27 LAB — HIV ANTIBODY (ROUTINE TESTING W REFLEX): HIV Screen 4th Generation wRfx: NONREACTIVE

## 2020-01-27 LAB — TSH: TSH: 20.243 u[IU]/mL — ABNORMAL HIGH (ref 0.350–4.500)

## 2020-01-27 MED ORDER — CLOPIDOGREL BISULFATE 75 MG PO TABS
75.0000 mg | ORAL_TABLET | Freq: Every day | ORAL | Status: DC
  Administered 2020-01-27: 75 mg via ORAL
  Filled 2020-01-27: qty 1

## 2020-01-27 MED ORDER — LEVOTHYROXINE SODIUM 100 MCG PO TABS
100.0000 ug | ORAL_TABLET | Freq: Every day | ORAL | Status: DC
  Administered 2020-01-28: 100 ug via ORAL
  Filled 2020-01-27: qty 1

## 2020-01-27 MED ORDER — PERFLUTREN LIPID MICROSPHERE
1.0000 mL | INTRAVENOUS | Status: AC | PRN
  Administered 2020-01-27: 2 mL via INTRAVENOUS
  Filled 2020-01-27: qty 10

## 2020-01-27 MED ORDER — ALBUTEROL SULFATE HFA 108 (90 BASE) MCG/ACT IN AERS
1.0000 | INHALATION_SPRAY | RESPIRATORY_TRACT | Status: DC | PRN
  Administered 2020-01-27: 2 via RESPIRATORY_TRACT
  Filled 2020-01-27: qty 6.7

## 2020-01-27 MED ORDER — LEVOTHYROXINE SODIUM 25 MCG PO TABS: 25.0000 ug | ORAL_TABLET | Freq: Every day | ORAL | Status: DC

## 2020-01-27 MED ORDER — ATORVASTATIN CALCIUM 40 MG PO TABS
40.0000 mg | ORAL_TABLET | Freq: Every day | ORAL | Status: DC
  Administered 2020-01-28 – 2020-01-30 (×3): 40 mg via ORAL
  Filled 2020-01-27 (×3): qty 1

## 2020-01-27 NOTE — Evaluation (Signed)
Physical Therapy Evaluation Patient Details Name: Maria Porter MRN: [id number] DOB: 07-29-1975 Today's Date: 01/27/2020   History of Present Illness  Pt is a 44 y/o female admitted secondary to R UE and LE weakness. Found to have L frontal lobe infarct.   Clinical Impression  Pt admitted secondary to problem above with deficits below. Noted weakness in R extremities and mild instability. Noted 1 LOB after turning around and required min A for steadying. Otherwise required min guard A for mobility tasks. Feel pt would benefit from outpatient PT at d/c to address current deficits. Will continue to follow acutely to maximize functional mobility independence and safety.     Follow Up Recommendations Outpatient PT (neuro outpatient PT )    Equipment Recommendations  None recommended by PT    Recommendations for Other Services       Precautions / Restrictions Precautions Precautions: Fall Restrictions Weight Bearing Restrictions: No      Mobility  Bed Mobility Overal bed mobility: Needs Assistance Bed Mobility: Supine to Sit;Sit to Supine     Supine to sit: Supervision Sit to supine: Supervision   General bed mobility comments: Supervision for safety.   Transfers Overall transfer level: Needs assistance Equipment used: None Transfers: Sit to/from Stand Sit to Stand: Min guard         General transfer comment: Min guard for safety.   Ambulation/Gait Ambulation/Gait assistance: Min assist;Min guard Gait Distance (Feet): 100 Feet Assistive device: None Gait Pattern/deviations: Step-through pattern Gait velocity: Decreased   General Gait Details: Functional weakness and mild instability noted in RLE during ambulation. Mild LOB when turning around and required min A for steadying. Otherwise requiring min guard A for safety.   Stairs            Wheelchair Mobility    Modified Rankin (Stroke Patients Only) Modified Rankin (Stroke Patients Only) Pre-Morbid  Rankin Score: No symptoms Modified Rankin: Moderately severe disability     Balance Overall balance assessment: Needs assistance Sitting-balance support: No upper extremity supported;Feet supported Sitting balance-Leahy Scale: Good     Standing balance support: No upper extremity supported;During functional activity Standing balance-Leahy Scale: Fair                               Pertinent Vitals/Pain Pain Assessment: No/denies pain    Home Living Family/patient expects to be discharged to:: Private residence Living Arrangements: Other relatives (sister) Available Help at Discharge: Family;Available 24 hours/day Type of Home: Apartment Home Access: Ramped entrance     Home Layout: One level Home Equipment: None      Prior Function Level of Independence: Independent               Hand Dominance   Dominant Hand: Left    Extremity/Trunk Assessment   Upper Extremity Assessment Upper Extremity Assessment: Defer to OT evaluation    Lower Extremity Assessment Lower Extremity Assessment: RLE deficits/detail RLE Deficits / Details: Grossly 4/5 throughout with MMT, however, did note functional weakness and mild RLE instability when ambulating.     Cervical / Trunk Assessment Cervical / Trunk Assessment: Normal  Communication   Communication: No difficulties  Cognition Arousal/Alertness: Awake/alert Behavior During Therapy: WFL for tasks assessed/performed Overall Cognitive Status: Within Functional Limits for tasks assessed  General Comments      Exercises     Assessment/Plan    PT Assessment Patient needs continued PT services  PT Problem List Decreased strength;Decreased balance;Decreased mobility       PT Treatment Interventions Gait training;DME instruction;Functional mobility training;Therapeutic activities;Therapeutic exercise;Balance training;Patient/family education    PT  Goals (Current goals can be found in the Care Plan section)  Acute Rehab PT Goals Patient Stated Goal: to go home PT Goal Formulation: With patient Time For Goal Achievement: 02/10/20 Potential to Achieve Goals: Good    Frequency Min 4X/week   Barriers to discharge        Co-evaluation               AM-PAC PT "6 Clicks" Mobility  Outcome Measure Help needed turning from your back to your side while in a flat bed without using bedrails?: None Help needed moving from lying on your back to sitting on the side of a flat bed without using bedrails?: None Help needed moving to and from a bed to a chair (including a wheelchair)?: A Little Help needed standing up from a chair using your arms (e.g., wheelchair or bedside chair)?: A Little Help needed to walk in hospital room?: A Little Help needed climbing 3-5 steps with a railing? : A Little 6 Click Score: 20    End of Session Equipment Utilized During Treatment: Gait belt Activity Tolerance: Patient tolerated treatment well Patient left: in bed;with call bell/phone within reach;with family/visitor present (on stretcher in ED ) Nurse Communication: Mobility status PT Visit Diagnosis: Unsteadiness on feet (R26.81);Muscle weakness (generalized) (M62.81)    Time: 2353-6144 PT Time Calculation (min) (ACUTE ONLY): 14 min   Charges:   PT Evaluation $PT Eval Low Complexity: 1 Low          Cindee Salt, DPT  Acute Rehabilitation Services  Pager: [phone] Office: [phone]   Lehman Prom 01/27/2020, 1:43 PM

## 2020-01-27 NOTE — Progress Notes (Signed)
OT Cancellation Note  Patient Details Name: Delona Clasby MRN: [id number] DOB: 05-13-1975   Cancelled Treatment:    Reason Eval/Treat Not Completed: Patient at procedure or test/ unavailable. Pt at ultrasound. Will follow up another time. Per nsg, pt not discharging today.   Thornell Mule, OT/L   Acute OT Clinical Specialist Acute Rehabilitation Services Pager [phone] Office [phone]  01/27/2020, 3:18 PM

## 2020-01-27 NOTE — ED Notes (Signed)
Patient is resting comfortably. Vitals stable. Room safe. NAD noted.

## 2020-01-27 NOTE — Progress Notes (Signed)
TCD bubble study completed with Dr. Roda Shutters, and bilateral lower extremity venous duplex completed. Refer to "CV Proc" under chart review to view preliminary results.  01/27/2020 4:21 PM Eula Fried., MHA, RVT, RDCS, RDMS

## 2020-01-27 NOTE — Progress Notes (Addendum)
STROKE TEAM PROGRESS NOTE   INTERVAL HISTORY Pt sister is at the bedside. Pt stated that her symptoms has resolved and she felt 100% better. However, she told me that she had episode 4 years ago with sudden onset vertigo at work, went to OSH stated for 5 days, had a CT but no MRI, was told to had stroke at the back of her head, but does not know what the cause of the stroke.    She moved to [place] 3 months ago.  She had migraine headache for the last 1 year, 2/month average, throbbing photophobia with nauseous but no vomiting.  Medication helps, urine lasting 2 hours.  She is current smoker, denies any OCP use.  Social drinker, denies any illicit drugs.  Vitals:   01/27/20 1100 01/27/20 1245 01/27/20 1300 01/27/20 1349  BP: 106/75  121/82   Pulse: (!) 54 71 79   Resp:      Temp:    97.9 F (36.6 C)  TempSrc:    Oral  SpO2: 100% 99% 100%    CBC:  Recent Labs  Lab 01/26/20 1404 01/26/20 1404 01/26/20 1409 01/27/20 0500  WBC 6.1  --   --  8.1  NEUTROABS 3.2  --   --   --   HGB 14.9   < > 15.6* 13.8  HCT 46.2*   < > 46.0 42.5  MCV 90.1  --   --  89.7  PLT 326  --   --  318   < > = values in this interval not displayed.   Basic Metabolic Panel:  Recent Labs  Lab 01/26/20 1404 01/26/20 1404 01/26/20 1409 01/27/20 0500  NA 139   < > 139 138  K 4.1   < > 4.4 3.7  CL 105   < > 107 104  CO2 25  --   --  23  GLUCOSE 124*   < > 119* 120*  BUN 9   < > 11 12  CREATININE 0.99   < > 1.00 1.03*  CALCIUM 9.9  --   --  9.3   < > = values in this interval not displayed.   Lipid Panel:  Recent Labs  Lab 01/27/20 0500  CHOL 181  TRIG 69  HDL 42  CHOLHDL 4.3  VLDL 14  LDLCALC 125*   HgbA1c:  Recent Labs  Lab 01/27/20 1011  HGBA1C 5.8*   Urine Drug Screen: No results for input(s): LABOPIA, COCAINSCRNUR, LABBENZ, AMPHETMU, THCU, LABBARB in the last 168 hours.  Alcohol Level No results for input(s): ETH in the last 168 hours.  IMAGING past 24 hours EEG  Result  Date: 01/26/2020 Lora Havens, MD     01/26/2020  5:19 PM Patient Name: Cecia Egge MRN: [id number] Epilepsy Attending: Lora Havens Referring Physician/Provider: Dr Donnetta Simpers Date: 01/26/2020 Duration: 25.62mns Patient history: 44y.o. female with PMH significant for prior L frontal stroke who presents with acute onset aphasia + R sided weakness. EEG to evaluate for seizure. Level of alertness: Awake AEDs during EEG study: None Technical aspects: This EEG study was done with scalp electrodes positioned according to the 10-20 International system of electrode placement. Electrical activity was acquired at a sampling rate of _0  and reviewed with a high frequency filter of _1  and a low frequency filter of _2 . EEG data were recorded continuously and digitally stored. Description: The posterior dominant rhythm consists of 8-9 Hz activity of moderate voltage (25-35 uV) seen predominantly in posterior head  regions, symmetric and reactive to eye opening and eye closing. EEG showed intermittent 3 to 6 Hz theta-delta slowing in left frontotemporal region.  Hyperventilation and photic stimulation were not performed.   ABNORMALITY -Intermittent slow, left frontotemporal region IMPRESSION: This study is suggestive of cortical dysfunction in left frontotemporal region likely secondary to underlying stroke. No seizures or epileptiform discharges were seen throughout the recording. Priyanka Barbra Sarks   MR ANGIO HEAD WO CONTRAST  Result Date: 01/26/2020 CLINICAL DATA:  Initial evaluation for neuro deficit, stroke. EXAM: MRI HEAD WITHOUT CONTRAST MRA HEAD WITHOUT CONTRAST MRA NECK WITHOUT CONTRAST TECHNIQUE: Multiplanar, multiecho pulse sequences of the brain and surrounding structures were obtained without intravenous contrast. Angiographic images of the Circle of Willis were obtained using MRA technique without intravenous contrast. Angiographic images of the neck were obtained using MRA technique  without intravenous contrast. Carotid stenosis measurements (when applicable) are obtained utilizing NASCET criteria, using the distal internal carotid diameter as the denominator. COMPARISON:  Prior CT and CTA from earlier the same day. FINDINGS: MRI HEAD FINDINGS Brain: Cerebral volume within normal limits for age. Few scattered subcentimeter foci of T2/FLAIR hyperintensity noted within the supratentorial cerebral white matter, nonspecific, but most commonly related to chronic microvascular ischemic disease. Focal area of encephalomalacia and gliosis seen involving the anterior left frontal cortex, consistent with a chronic left MCA territory infarct. Additional tiny remote cortical infarct noted at the posterior right frontal region (series 15, image 17). Associated mild chronic hemosiderin staining seen about these chronic infarcts. Additional chronic infarct noted at the paramedian inferior left cerebellum. Small linear focus of restricted diffusion involving the cortical and subcortical aspect of the left frontal lobe consistent with a small acute ischemic infarct, left MCA distribution (series 7, image 58). This measures approximately 2.4 cm in length. No associated hemorrhage or mass effect. No other evidence for acute or subacute ischemia. Gray-white matter differentiation otherwise maintained. No other areas of encephalomalacia to suggest chronic cortical infarction. No other evidence for acute or chronic intracranial hemorrhage. No mass lesion, midline shift or mass effect. No hydrocephalus or extra-axial fluid collection. Pituitary gland suprasellar region within normal limits. Midline structures intact. Vascular: Major intracranial vascular flow voids are maintained. Skull and upper cervical spine: Craniocervical junction within normal limits. Signal intensity within the visualized bone marrow somewhat diffusely decreased on T1 weighted imaging, nonspecific, but most commonly related to anemia, smoking,  or obesity. No focal marrow replacing lesion. No scalp soft tissue abnormality. Sinuses/Orbits: Globes and orbital soft tissues within normal limits. Paranasal sinuses are clear. No significant mastoid effusion. Other: None. MRA HEAD FINDINGS ANTERIOR CIRCULATION: Visualized distal cervical segments of the internal carotid arteries are widely patent with symmetric antegrade flow. Petrous, cavernous, and supraclinoid segments widely patent without stenosis or other abnormality. A1 segments patent bilaterally. Normal anterior communicating artery complex. Anterior cerebral arteries widely patent to their distal aspects without stenosis. No M1 stenosis or occlusion. Normal MCA bifurcations. Distal MCA branches well perfused and symmetric. POSTERIOR CIRCULATION: Both vertebral arteries patent to the vertebrobasilar junction without stenosis. Right vertebral artery dominant. Patent left PICA. Right PICA not seen. Basilar patent to its distal aspect without stenosis. Superior cerebral arteries patent bilaterally, with the left SCA arising from the left P1/P2 junction. Right PCA supplied via the basilar. Fetal type origin of the left PCA supplied via a robust left posterior communicating artery. Both PCAs well perfused to their distal aspects without stenosis. No intracranial aneurysm. MRA NECK FINDINGS AORTIC ARCH: Examination technically limited by lack  of IV contrast. Visualized aortic arch within normal limits for caliber. Bovine branching pattern with common origin of the left common and innominate artery noted. No flow-limiting stenosis about the origin of the great vessels. RIGHT CAROTID SYSTEM: Right common and internal carotid arteries patent without stenosis or occlusion. No significant atheromatous narrowing about the right bifurcation. LEFT CAROTID SYSTEM: Left common and internal carotid arteries patent without stenosis or occlusion. No significant atheromatous narrowing about the left bifurcation. VERTEBRAL  ARTERIES: Both vertebral arteries arise from the subclavian arteries. Right vertebral artery dominant. Vertebral arteries patent without stenosis or occlusion. IMPRESSION: MRI HEAD IMPRESSION: 1. 2.4 cm linear acute ischemic infarct involving the cortical and subcortical left frontal lobe as above. No associated hemorrhage or mass effect. 2. Multiple old/chronic infarctions involving the bilateral frontal lobes and inferior left cerebellum. MRA HEAD IMPRESSION: Normal intracranial MRA. No large vessel occlusion, hemodynamically significant stenosis, or other vascular abnormality. No aneurysm. MRA NECK IMPRESSION: Normal MRA of the neck. Electronically Signed   By: Jeannine Boga M.D.   On: 01/26/2020 20:28   MR ANGIO NECK WO CONTRAST  Result Date: 01/26/2020 CLINICAL DATA:  Initial evaluation for neuro deficit, stroke. EXAM: MRI HEAD WITHOUT CONTRAST MRA HEAD WITHOUT CONTRAST MRA NECK WITHOUT CONTRAST TECHNIQUE: Multiplanar, multiecho pulse sequences of the brain and surrounding structures were obtained without intravenous contrast. Angiographic images of the Circle of Willis were obtained using MRA technique without intravenous contrast. Angiographic images of the neck were obtained using MRA technique without intravenous contrast. Carotid stenosis measurements (when applicable) are obtained utilizing NASCET criteria, using the distal internal carotid diameter as the denominator. COMPARISON:  Prior CT and CTA from earlier the same day. FINDINGS: MRI HEAD FINDINGS Brain: Cerebral volume within normal limits for age. Few scattered subcentimeter foci of T2/FLAIR hyperintensity noted within the supratentorial cerebral white matter, nonspecific, but most commonly related to chronic microvascular ischemic disease. Focal area of encephalomalacia and gliosis seen involving the anterior left frontal cortex, consistent with a chronic left MCA territory infarct. Additional tiny remote cortical infarct noted at the  posterior right frontal region (series 15, image 17). Associated mild chronic hemosiderin staining seen about these chronic infarcts. Additional chronic infarct noted at the paramedian inferior left cerebellum. Small linear focus of restricted diffusion involving the cortical and subcortical aspect of the left frontal lobe consistent with a small acute ischemic infarct, left MCA distribution (series 7, image 58). This measures approximately 2.4 cm in length. No associated hemorrhage or mass effect. No other evidence for acute or subacute ischemia. Gray-white matter differentiation otherwise maintained. No other areas of encephalomalacia to suggest chronic cortical infarction. No other evidence for acute or chronic intracranial hemorrhage. No mass lesion, midline shift or mass effect. No hydrocephalus or extra-axial fluid collection. Pituitary gland suprasellar region within normal limits. Midline structures intact. Vascular: Major intracranial vascular flow voids are maintained. Skull and upper cervical spine: Craniocervical junction within normal limits. Signal intensity within the visualized bone marrow somewhat diffusely decreased on T1 weighted imaging, nonspecific, but most commonly related to anemia, smoking, or obesity. No focal marrow replacing lesion. No scalp soft tissue abnormality. Sinuses/Orbits: Globes and orbital soft tissues within normal limits. Paranasal sinuses are clear. No significant mastoid effusion. Other: None. MRA HEAD FINDINGS ANTERIOR CIRCULATION: Visualized distal cervical segments of the internal carotid arteries are widely patent with symmetric antegrade flow. Petrous, cavernous, and supraclinoid segments widely patent without stenosis or other abnormality. A1 segments patent bilaterally. Normal anterior communicating artery complex. Anterior cerebral arteries widely  patent to their distal aspects without stenosis. No M1 stenosis or occlusion. Normal MCA bifurcations. Distal MCA  branches well perfused and symmetric. POSTERIOR CIRCULATION: Both vertebral arteries patent to the vertebrobasilar junction without stenosis. Right vertebral artery dominant. Patent left PICA. Right PICA not seen. Basilar patent to its distal aspect without stenosis. Superior cerebral arteries patent bilaterally, with the left SCA arising from the left P1/P2 junction. Right PCA supplied via the basilar. Fetal type origin of the left PCA supplied via a robust left posterior communicating artery. Both PCAs well perfused to their distal aspects without stenosis. No intracranial aneurysm. MRA NECK FINDINGS AORTIC ARCH: Examination technically limited by lack of IV contrast. Visualized aortic arch within normal limits for caliber. Bovine branching pattern with common origin of the left common and innominate artery noted. No flow-limiting stenosis about the origin of the great vessels. RIGHT CAROTID SYSTEM: Right common and internal carotid arteries patent without stenosis or occlusion. No significant atheromatous narrowing about the right bifurcation. LEFT CAROTID SYSTEM: Left common and internal carotid arteries patent without stenosis or occlusion. No significant atheromatous narrowing about the left bifurcation. VERTEBRAL ARTERIES: Both vertebral arteries arise from the subclavian arteries. Right vertebral artery dominant. Vertebral arteries patent without stenosis or occlusion. IMPRESSION: MRI HEAD IMPRESSION: 1. 2.4 cm linear acute ischemic infarct involving the cortical and subcortical left frontal lobe as above. No associated hemorrhage or mass effect. 2. Multiple old/chronic infarctions involving the bilateral frontal lobes and inferior left cerebellum. MRA HEAD IMPRESSION: Normal intracranial MRA. No large vessel occlusion, hemodynamically significant stenosis, or other vascular abnormality. No aneurysm. MRA NECK IMPRESSION: Normal MRA of the neck. Electronically Signed   By: Jeannine Boga M.D.   On:  01/26/2020 20:28   MR BRAIN WO CONTRAST  Result Date: 01/26/2020 CLINICAL DATA:  Initial evaluation for neuro deficit, stroke. EXAM: MRI HEAD WITHOUT CONTRAST MRA HEAD WITHOUT CONTRAST MRA NECK WITHOUT CONTRAST TECHNIQUE: Multiplanar, multiecho pulse sequences of the brain and surrounding structures were obtained without intravenous contrast. Angiographic images of the Circle of Willis were obtained using MRA technique without intravenous contrast. Angiographic images of the neck were obtained using MRA technique without intravenous contrast. Carotid stenosis measurements (when applicable) are obtained utilizing NASCET criteria, using the distal internal carotid diameter as the denominator. COMPARISON:  Prior CT and CTA from earlier the same day. FINDINGS: MRI HEAD FINDINGS Brain: Cerebral volume within normal limits for age. Few scattered subcentimeter foci of T2/FLAIR hyperintensity noted within the supratentorial cerebral white matter, nonspecific, but most commonly related to chronic microvascular ischemic disease. Focal area of encephalomalacia and gliosis seen involving the anterior left frontal cortex, consistent with a chronic left MCA territory infarct. Additional tiny remote cortical infarct noted at the posterior right frontal region (series 15, image 17). Associated mild chronic hemosiderin staining seen about these chronic infarcts. Additional chronic infarct noted at the paramedian inferior left cerebellum. Small linear focus of restricted diffusion involving the cortical and subcortical aspect of the left frontal lobe consistent with a small acute ischemic infarct, left MCA distribution (series 7, image 58). This measures approximately 2.4 cm in length. No associated hemorrhage or mass effect. No other evidence for acute or subacute ischemia. Gray-white matter differentiation otherwise maintained. No other areas of encephalomalacia to suggest chronic cortical infarction. No other evidence for  acute or chronic intracranial hemorrhage. No mass lesion, midline shift or mass effect. No hydrocephalus or extra-axial fluid collection. Pituitary gland suprasellar region within normal limits. Midline structures intact. Vascular: Major intracranial vascular flow  voids are maintained. Skull and upper cervical spine: Craniocervical junction within normal limits. Signal intensity within the visualized bone marrow somewhat diffusely decreased on T1 weighted imaging, nonspecific, but most commonly related to anemia, smoking, or obesity. No focal marrow replacing lesion. No scalp soft tissue abnormality. Sinuses/Orbits: Globes and orbital soft tissues within normal limits. Paranasal sinuses are clear. No significant mastoid effusion. Other: None. MRA HEAD FINDINGS ANTERIOR CIRCULATION: Visualized distal cervical segments of the internal carotid arteries are widely patent with symmetric antegrade flow. Petrous, cavernous, and supraclinoid segments widely patent without stenosis or other abnormality. A1 segments patent bilaterally. Normal anterior communicating artery complex. Anterior cerebral arteries widely patent to their distal aspects without stenosis. No M1 stenosis or occlusion. Normal MCA bifurcations. Distal MCA branches well perfused and symmetric. POSTERIOR CIRCULATION: Both vertebral arteries patent to the vertebrobasilar junction without stenosis. Right vertebral artery dominant. Patent left PICA. Right PICA not seen. Basilar patent to its distal aspect without stenosis. Superior cerebral arteries patent bilaterally, with the left SCA arising from the left P1/P2 junction. Right PCA supplied via the basilar. Fetal type origin of the left PCA supplied via a robust left posterior communicating artery. Both PCAs well perfused to their distal aspects without stenosis. No intracranial aneurysm. MRA NECK FINDINGS AORTIC ARCH: Examination technically limited by lack of IV contrast. Visualized aortic arch within  normal limits for caliber. Bovine branching pattern with common origin of the left common and innominate artery noted. No flow-limiting stenosis about the origin of the great vessels. RIGHT CAROTID SYSTEM: Right common and internal carotid arteries patent without stenosis or occlusion. No significant atheromatous narrowing about the right bifurcation. LEFT CAROTID SYSTEM: Left common and internal carotid arteries patent without stenosis or occlusion. No significant atheromatous narrowing about the left bifurcation. VERTEBRAL ARTERIES: Both vertebral arteries arise from the subclavian arteries. Right vertebral artery dominant. Vertebral arteries patent without stenosis or occlusion. IMPRESSION: MRI HEAD IMPRESSION: 1. 2.4 cm linear acute ischemic infarct involving the cortical and subcortical left frontal lobe as above. No associated hemorrhage or mass effect. 2. Multiple old/chronic infarctions involving the bilateral frontal lobes and inferior left cerebellum. MRA HEAD IMPRESSION: Normal intracranial MRA. No large vessel occlusion, hemodynamically significant stenosis, or other vascular abnormality. No aneurysm. MRA NECK IMPRESSION: Normal MRA of the neck. Electronically Signed   By: Jeannine Boga M.D.   On: 01/26/2020 20:28   CT CEREBRAL PERFUSION W CONTRAST  Result Date: 01/26/2020 CLINICAL DATA:  Neuro deficit, acute, stroke suspected. EXAM: CT ANGIOGRAPHY HEAD AND NECK CT PERFUSION BRAIN TECHNIQUE: Multidetector CT imaging of the head and neck was performed using the standard protocol during bolus administration of intravenous contrast. Multiplanar CT image reconstructions and MIPs were obtained to evaluate the vascular anatomy. Carotid stenosis measurements (when applicable) are obtained utilizing NASCET criteria, using the distal internal carotid diameter as the denominator. Multiphase CT imaging of the brain was performed following IV bolus contrast injection. Subsequent parametric perfusion maps  were calculated using RAPID software. CONTRAST:  165m OMNIPAQUE IOHEXOL 350 MG/ML SOLN COMPARISON:  CT head without contrast 01/26/2020 FINDINGS: CTA NECK FINDINGS Aortic arch: Common origin of the left common carotid artery and innominate artery is noted. No significant stenosis or vascular disease present the arch or great vessel origins. Right carotid system: Right common carotid artery is within normal limits. Bifurcation is unremarkable. Cervical right ICA is within normal limits. Left carotid system: Of the left common carotid artery is within normal limits. Bifurcation is unremarkable. Cervical left ICA is normal. Vertebral  arteries: The right vertebral artery is the dominant vessel. Both vertebral arteries originate from the subclavians without significant stenosis. No significant stenosis is present in either vertebral artery in the neck. Skeleton: There is some straightening of the normal cervical lordosis. No significant stenosis is present in either vertebral artery in the neck. Other neck: Soft tissues the neck are otherwise unremarkable. No significant adenopathy is present. Thyroid is normal. Salivary glands are within normal limits. Upper chest: Mild the lung apices are clear. Thoracic inlet is within normal limits. Review of the MIP images confirms the above findings CTA HEAD FINDINGS Anterior circulation: The internal carotid arteries are within normal limits from the high cervical segments through the ICA termini scratched at the internal carotid arteries are within normal limits from the skull base through the ICA termini. The A1 and M1 segments are normal. The anterior communicating artery is patent. The MCA bifurcations are intact. The ACA and MCA branch vessels are within normal limits. Posterior circulation: Right vertebral artery is the dominant left Sol. PICA origins are visualized and normal. Basilar artery is within normal limits. The right posterior cerebral artery arises from the  basilar tip. The left posterior cerebral artery is of fetal type. PCA branch vessels are within normal limits bilaterally. Venous sinuses: The dural sinuses are patent. The straight sinus and deep cerebral veins are intact. Cortical veins are unremarkable. Anatomic variants: Fetal type left posterior cerebral artery. Review of the MIP images confirms the above findings CT Brain Perfusion Findings: ASPECTS: 9/10 CBF (<30%) Volume: 37m Perfusion (Tmax>6.0s) volume: 029mMismatch Volume: 96m496mnfarction Location:N/A IMPRESSION: 1. Normal variant CTA Circle of Willis without significant proximal stenosis, aneurysm, or branch vessel occlusion. 2. Normal CTA of the neck. 3. CT perfusion is normal. Electronically Signed   By: ChrSan MorelleD.   On: 01/26/2020 14:54   ECHOCARDIOGRAM COMPLETE  Result Date: 01/27/2020    ECHOCARDIOGRAM REPORT   Patient Name:   DANSPARROW SIRACUSAte of Exam: 01/27/2020 Medical Rec #:  [id number]    Height: Accession #:    [id number]   Weight: Date of Birth:  1/1December 22, 1977    BSA: Patient Age:    44 9ars        BP:           123/97 mmHg Patient Gender: F               HR:           79 bpm. Exam Location:  Inpatient Procedure: 2D Echo, Cardiac Doppler, Color Doppler and Intracardiac            Opacification Agent Indications:    CVA  History:        Patient has no prior history of Echocardiogram examinations.                 Stroke, Arrythmias:Abnormal EKG; Risk Factors:Current Smoker and                 Hypertension.  Sonographer:    BroDustin Flockferring Phys: [id number]. Left ventricular ejection fraction, by estimation, is 55 to 60%. The left ventricle has normal function. The left ventricle has no regional wall motion abnormalities. Left ventricular diastolic parameters were normal.  2. Right ventricular systolic function is normal. The right ventricular size is normal. Tricuspid regurgitation signal is inadequate for assessing PA  pressure.  3. The mitral valve is normal in  structure. Trivial mitral valve regurgitation. No evidence of mitral stenosis.  4. The aortic valve is grossly normal. Aortic valve regurgitation is not visualized. No aortic stenosis is present.  5. The inferior vena cava is normal in size with greater than 50% respiratory variability, suggesting right atrial pressure of 3 mmHg. Comparison(s): No prior Echocardiogram. Conclusion(s)/Recommendation(s): Normal biventricular function without evidence of hemodynamically significant valvular heart disease. FINDINGS  Left Ventricle: Left ventricular ejection fraction, by estimation, is 55 to 60%. The left ventricle has normal function. The left ventricle has no regional wall motion abnormalities. Definity contrast agent was given IV to delineate the left ventricular  endocardial borders. The left ventricular internal cavity size was normal in size. There is borderline left ventricular hypertrophy. Left ventricular diastolic parameters were normal. Right Ventricle: The right ventricular size is normal. No increase in right ventricular wall thickness. Right ventricular systolic function is normal. Tricuspid regurgitation signal is inadequate for assessing PA pressure. Left Atrium: Left atrial size was normal in size. Right Atrium: Right atrial size was normal in size. Pericardium: There is no evidence of pericardial effusion. Mitral Valve: The mitral valve is normal in structure. Trivial mitral valve regurgitation. No evidence of mitral valve stenosis. Tricuspid Valve: The tricuspid valve is normal in structure. Tricuspid valve regurgitation is trivial. No evidence of tricuspid stenosis. Aortic Valve: The aortic valve is grossly normal. Aortic valve regurgitation is not visualized. No aortic stenosis is present. Pulmonic Valve: The pulmonic valve was not well visualized. Pulmonic valve regurgitation is not visualized. Aorta: The aortic root, ascending aorta and aortic arch are all  structurally normal, with no evidence of dilitation or obstruction. Venous: The inferior vena cava is normal in size with greater than 50% respiratory variability, suggesting right atrial pressure of 3 mmHg. IAS/Shunts: No atrial level shunt detected by color flow Doppler.  LEFT VENTRICLE PLAX 2D LVIDd:         3.40 cm  Diastology LVIDs:         2.40 cm  LV e' medial:    10.30 cm/s LV PW:         1.20 cm  LV E/e' medial:  6.4 LV IVS:        1.20 cm  LV e' lateral:   7.83 cm/s LVOT diam:     2.00 cm  LV E/e' lateral: 8.4 LV SV:         63 LVOT Area:     3.14 cm  RIGHT VENTRICLE RV Basal diam:  2.90 cm RV S prime:     11.70 cm/s TAPSE (M-mode): 2.6 cm LEFT ATRIUM             RIGHT ATRIUM LA diam:        2.50 cm RA Area:     10.00 cm LA Vol (A2C):   27.9 ml RA Volume:   20.00 ml LA Vol (A4C):   16.3 ml LA Biplane Vol: 23.1 ml  AORTIC VALVE LVOT Vmax:   111.00 cm/s LVOT Vmean:  74.500 cm/s LVOT VTI:    0.202 m  AORTA Ao Root diam: 2.90 cm MITRAL VALVE MV Area (PHT): 3.03 cm    SHUNTS MV Decel Time: 250 msec    Systemic VTI:  0.20 m MV E velocity: 66.00 cm/s  Systemic Diam: 2.00 cm MV A velocity: 60.00 cm/s MV E/A ratio:  1.10 Buford Dresser MD Electronically signed by Buford Dresser MD Signature Date/Time: 01/27/2020/11:21:54 AM    Final    CT ANGIO HEAD CODE STROKE  Result Date: 01/26/2020 CLINICAL DATA:  Neuro deficit, acute, stroke suspected. EXAM: CT ANGIOGRAPHY HEAD AND NECK CT PERFUSION BRAIN TECHNIQUE: Multidetector CT imaging of the head and neck was performed using the standard protocol during bolus administration of intravenous contrast. Multiplanar CT image reconstructions and MIPs were obtained to evaluate the vascular anatomy. Carotid stenosis measurements (when applicable) are obtained utilizing NASCET criteria, using the distal internal carotid diameter as the denominator. Multiphase CT imaging of the brain was performed following IV bolus contrast injection. Subsequent parametric  perfusion maps were calculated using RAPID software. CONTRAST:  131m OMNIPAQUE IOHEXOL 350 MG/ML SOLN COMPARISON:  CT head without contrast 01/26/2020 FINDINGS: CTA NECK FINDINGS Aortic arch: Common origin of the left common carotid artery and innominate artery is noted. No significant stenosis or vascular disease present the arch or great vessel origins. Right carotid system: Right common carotid artery is within normal limits. Bifurcation is unremarkable. Cervical right ICA is within normal limits. Left carotid system: Of the left common carotid artery is within normal limits. Bifurcation is unremarkable. Cervical left ICA is normal. Vertebral arteries: The right vertebral artery is the dominant vessel. Both vertebral arteries originate from the subclavians without significant stenosis. No significant stenosis is present in either vertebral artery in the neck. Skeleton: There is some straightening of the normal cervical lordosis. No significant stenosis is present in either vertebral artery in the neck. Other neck: Soft tissues the neck are otherwise unremarkable. No significant adenopathy is present. Thyroid is normal. Salivary glands are within normal limits. Upper chest: Mild the lung apices are clear. Thoracic inlet is within normal limits. Review of the MIP images confirms the above findings CTA HEAD FINDINGS Anterior circulation: The internal carotid arteries are within normal limits from the high cervical segments through the ICA termini scratched at the internal carotid arteries are within normal limits from the skull base through the ICA termini. The A1 and M1 segments are normal. The anterior communicating artery is patent. The MCA bifurcations are intact. The ACA and MCA branch vessels are within normal limits. Posterior circulation: Right vertebral artery is the dominant left Sol. PICA origins are visualized and normal. Basilar artery is within normal limits. The right posterior cerebral artery arises  from the basilar tip. The left posterior cerebral artery is of fetal type. PCA branch vessels are within normal limits bilaterally. Venous sinuses: The dural sinuses are patent. The straight sinus and deep cerebral veins are intact. Cortical veins are unremarkable. Anatomic variants: Fetal type left posterior cerebral artery. Review of the MIP images confirms the above findings CT Brain Perfusion Findings: ASPECTS: 9/10 CBF (<30%) Volume: 039mPerfusion (Tmax>6.0s) volume: 78m69mismatch Volume: 78mL67mfarction Location:N/A IMPRESSION: 1. Normal variant CTA Circle of Willis without significant proximal stenosis, aneurysm, or branch vessel occlusion. 2. Normal CTA of the neck. 3. CT perfusion is normal. Electronically Signed   By: ChriSan Morelle.   On: 01/26/2020 14:54   CT ANGIO NECK CODE STROKE  Result Date: 01/26/2020 CLINICAL DATA:  Neuro deficit, acute, stroke suspected. EXAM: CT ANGIOGRAPHY HEAD AND NECK CT PERFUSION BRAIN TECHNIQUE: Multidetector CT imaging of the head and neck was performed using the standard protocol during bolus administration of intravenous contrast. Multiplanar CT image reconstructions and MIPs were obtained to evaluate the vascular anatomy. Carotid stenosis measurements (when applicable) are obtained utilizing NASCET criteria, using the distal internal carotid diameter as the denominator. Multiphase CT imaging of the brain was performed following IV bolus contrast injection. Subsequent parametric perfusion maps were  calculated using RAPID software. CONTRAST:  165m OMNIPAQUE IOHEXOL 350 MG/ML SOLN COMPARISON:  CT head without contrast 01/26/2020 FINDINGS: CTA NECK FINDINGS Aortic arch: Common origin of the left common carotid artery and innominate artery is noted. No significant stenosis or vascular disease present the arch or great vessel origins. Right carotid system: Right common carotid artery is within normal limits. Bifurcation is unremarkable. Cervical right ICA is  within normal limits. Left carotid system: Of the left common carotid artery is within normal limits. Bifurcation is unremarkable. Cervical left ICA is normal. Vertebral arteries: The right vertebral artery is the dominant vessel. Both vertebral arteries originate from the subclavians without significant stenosis. No significant stenosis is present in either vertebral artery in the neck. Skeleton: There is some straightening of the normal cervical lordosis. No significant stenosis is present in either vertebral artery in the neck. Other neck: Soft tissues the neck are otherwise unremarkable. No significant adenopathy is present. Thyroid is normal. Salivary glands are within normal limits. Upper chest: Mild the lung apices are clear. Thoracic inlet is within normal limits. Review of the MIP images confirms the above findings CTA HEAD FINDINGS Anterior circulation: The internal carotid arteries are within normal limits from the high cervical segments through the ICA termini scratched at the internal carotid arteries are within normal limits from the skull base through the ICA termini. The A1 and M1 segments are normal. The anterior communicating artery is patent. The MCA bifurcations are intact. The ACA and MCA branch vessels are within normal limits. Posterior circulation: Right vertebral artery is the dominant left Sol. PICA origins are visualized and normal. Basilar artery is within normal limits. The right posterior cerebral artery arises from the basilar tip. The left posterior cerebral artery is of fetal type. PCA branch vessels are within normal limits bilaterally. Venous sinuses: The dural sinuses are patent. The straight sinus and deep cerebral veins are intact. Cortical veins are unremarkable. Anatomic variants: Fetal type left posterior cerebral artery. Review of the MIP images confirms the above findings CT Brain Perfusion Findings: ASPECTS: 9/10 CBF (<30%) Volume: 0717mPerfusion (Tmax>6.0s) volume: 17m85mMismatch Volume: 17mL59mfarction Location:N/A IMPRESSION: 1. Normal variant CTA Circle of Willis without significant proximal stenosis, aneurysm, or branch vessel occlusion. 2. Normal CTA of the neck. 3. CT perfusion is normal. Electronically Signed   By: ChriSan Morelle.   On: 01/26/2020 14:54    PHYSICAL EXAM  Temp:  [97.5 F (36.4 C)-98.4 F (36.9 C)] 98.4 F (36.9 C) (09/22 1425) Pulse Rate:  [52-102] 80 (09/22 1425) Resp:  [9-32] 16 (09/22 1425) BP: (91-154)/(65-113) 118/85 (09/22 1425) SpO2:  [95 %-100 %] 96 % (09/22 1425) Weight:  [93.4 kg] 93.4 kg (09/22 1425)  General - Well nourished, well developed, in no apparent distress.  Ophthalmologic - fundi not visualized due to noncooperation.  Cardiovascular - Regular rhythm and rate.  Mental Status -  Level of arousal and orientation to time, place, and person were intact. Language including expression, naming, repetition, comprehension was assessed and found intact. Attention span and concentration were normal. Fund of Knowledge was assessed and was intact.  Cranial Nerves II - XII - II - Visual field intact OU. III, IV, VI - Extraocular movements intact. V - Facial sensation intact bilaterally. VII - Facial movement intact bilaterally. VIII - Hearing & vestibular intact bilaterally. X - Palate elevates symmetrically. XI - Chin turning & shoulder shrug intact bilaterally. XII - Tongue protrusion intact.  Motor Strength - The patient's strength  was normal in all extremities and pronator drift was absent.  Bulk was normal and fasciculations were absent.   Motor Tone - Muscle tone was assessed at the neck and appendages and was normal.  Reflexes - The patient's reflexes were symmetrical in all extremities and she had no pathological reflexes.  Sensory - Light touch, temperature/pinprick were assessed and were symmetrical.    Coordination - The patient had normal movements in the hands and feet with no ataxia or  dysmetria.  Tremor was absent.  Gait and Station - deferred.   ASSESSMENT/PLAN Ms. Mariette Cowley is a 44 y.o. female with history of L frontal stroke presenting with aphasia and R sided weakness that progressed from acute R face and arm numbness with inability to speak.   Stroke:  L frontal cortical infarct in setting of previous left frontal infarct, felt to be embolic without source identified. Workup underway  Code Stroke CT head subacute L frontal lobe infarct ASPECTS 9    CTA head & neck Unremarkable   CT perfusion Unremarkable   MRI  L frontal lobe cortical and subcortical infarct. Multiple old B frontal lobe and inferior L cerebellar infarcts  MRA head Unremarkable   MRA neck Unremarkable   EEG slow, no seizure  2D Echo EF 55-60%. No source of embolus   TCD w/ bubble pending  LE doppler pending   TEE to look for embolic source. Arranged with [place] for Friday (schedule full tomorrow)    Pt agrees with LP to rule out CNS vasculitis  LDL 125  HgbA1c 5.8  Hypercoagulable labs pending   ESR and CRP neg, B12 332  VTE prophylaxis - Lovenox 40 mg sq daily   aspirin 81 mg daily prior to admission, now on aspirin 81 mg daily. Continue DAPT x 3 weeks after LP then plavix alone  Therapy recommendations:  OP PT  Disposition:  Return home  Hyperlipidemia  Home meds:  No statin  Now on lipitor 40  LDL 125, goal < 70  Continue statin at discharge  Tobacco abuse  Current smoker  Smoking cessation counseling provided  Nicotine patch   Pt is willing to quit   Other Stroke Risk Factors  Hx stroke/TIA - prior B frontal lobe infarct and left cerebellar infarcts on imaging this admission (details not available in Epic).    Obesity, BMI 35.34, recommend weight loss, health diet and regular exercise  Other Active Problems  TSH 20.243, free T4 normal  Hospital day # 1  Rosalin Hawking, MD PhD Stroke Neurology 01/27/2020 3:07  PM  To contact Stroke Continuity provider, please refer to [email or web address] After hours, contact General Neurology

## 2020-01-27 NOTE — Progress Notes (Signed)
Subjective: HD 1 Overnight, no acute events reported. This morning, Ms. Toleen Lachapelle was evaluated at bedside. She is feeling well and endorses improved strength on her right side and in her speech. Discussed with patient MRI finding of an acute left frontal infarct and recommendation for secondary stroke prevention going forward. She expresses understanding.   Objective:  Vital signs in last 24 hours: Vitals:   01/27/20 0630 01/27/20 0645 01/27/20 0700 01/27/20 0739  BP:      Pulse: (!) 56 (!) 54 (!) 54 (!) 58  Resp: (!) 22 18 (!) 21 (!) 21  Temp:      TempSrc:      SpO2: 97% 99% 97% 98%   CBC Latest Ref Rng & Units 01/27/2020 01/26/2020 01/26/2020  WBC 4.0 - 10.5 K/uL 8.1 - 6.1  Hemoglobin 12.0 - 15.0 g/dL 82.4 15.6(H) 14.9  Hematocrit 36 - 46 % 42.5 46.0 46.2(H)  Platelets 150 - 400 K/uL 318 - 326   BMP Latest Ref Rng & Units 01/27/2020 01/26/2020 01/26/2020  Glucose 70 - 99 mg/dL 235(T) 614(E) 315(Q)  BUN 6 - 20 mg/dL 12 11 9   Creatinine 0.44 - 1.00 mg/dL ) 0.08(Q 7.61  Sodium 135 - 145 mmol/L 138 139 139  Potassium 3.5 - 5.1 mmol/L 3.7 4.4 4.1  Chloride 98 - 111 mmol/L 104 107 105  CO2 22 - 32 mmol/L 23 - 25  Calcium 8.9 - 10.3 mg/dL 9.3 - 9.9   Lipid Panel     Component Value Date/Time   CHOL 181 01/27/2020 0500   TRIG 69 01/27/2020 0500   HDL 42 01/27/2020 0500   CHOLHDL 4.3 01/27/2020 0500   VLDL 14 01/27/2020 0500   LDLCALC 125 (H) 01/27/2020 0500   Physical Exam  Constitutional: Appears well-developed and well-nourished. No distress.  Cardiovascular: Normal rate, regular rhythm, S1 and S2 present, no murmurs, rubs, gallops.  Distal pulses intact Respiratory: No respiratory distress, no accessory muscle use.  Effort is normal.  Lungs are clear to auscultation bilaterally. Musculoskeletal: Normal bulk and tone.  No peripheral edema noted. Neurological: Mental Status: Patient is awake, alert, oriented x4 No signs of aphasia or neglect; dysarthria  improved Cranial Nerves: II: Pupils equal, round, and reactive to light.   III,IV, VI: EOMI without ptosis or diploplia.  V: Facial sensation to light touch improved in right V2 and V3 distribution VII: Facial movement is symmetric.  VIII: hearing is intact to voice X: Uvula elevates symmetrically XI: Shoulder shrug is symmetric. XII: tongue is midline without atrophy or fasciculations.  Motor: 4/5 in RUE, 5/5 in LUE and bilateral lower extremities  Sensory: Sensation is grossly intact in bilateral UEs & LEs Cerebellar: Heel-Shin intact bilalat Skin: Warm and dry.  No rash, erythema, lesions noted. Psychiatric: Normal mood and affect. Behavior is normal. Judgment and thought content normal.   Assessment/Plan:  Active Problems:   Acute CVA (cerebrovascular accident) (HCC)  Ms. Ashrita Chrismer is a 44 year old female with PMHx of prior stroke and tobacco use disorder admitted for left frontal lobe ischemic infarct.  Acute left frontal lobe ischemic infarct: Presented with acute onset right sided weakness and aphasia. MRI with new ischemic left frontal cortical and subcortical stroke. Patient's neurologic exam is improved this morning with improvement in sensation to light touch in right V2 and V3 segments and also improved strength in right upper extremity. Risk factors include prior stroke and tobacco use disorder. However, given that she is fairly young, work up for hypercoagulability  pursued.  Echo with EF 55-60% without any regional wall motion abnormalities; no significant valvular defects noted. MRA head and neck without significant stenosis noted.  Did have mild elevation of LDL to 125 on lipid panel and is prediabetic with HbA1c of 5.8.  - Neurology consulted, appreciate their recommendations - Aspirin 81mg  daily and plavix 75mg  daily x 21 days - Atorvastatin 80mg  daily  - F/u hypercoagulable work up - F/u transcranial doppler and lower extremity DVT - PT/OT eval  Tobacco  use disorder: Discussed recommendation for smoking cessation for which patient is currently in contemplative stage.  - Nicotine patch 14mg  daily during hospitalization - Encourage smoking cessation   Diet: HH Fluid/Electrolytes: Monitor and replete prn Code status: FULL  Prior to Admission Living Arrangement: Home Anticipated Discharge Location: Home  Barriers to Discharge: Continued medical management  Dispo: Anticipated discharge in approximately 0-1 day(s).   , MD  [facility] PGY-2 01/27/2020, 7:43 AM Pager: [phone] After 5pm on weekdays and 1pm on weekends: On Call pager [phone]

## 2020-01-27 NOTE — ED Notes (Signed)
Patient ambulated to and from restroom independently with steady gait. Speech clear and appropriate. Patient provided with graham crackers and warm tea. She denies further needs. Vitals stable, room safe, call bell in reach.

## 2020-01-27 NOTE — Progress Notes (Signed)
  Echocardiogram 2D Echocardiogram has been performed.  Pieter Partridge 01/27/2020, 8:47 AM

## 2020-01-28 DIAGNOSIS — E039 Hypothyroidism, unspecified: Secondary | ICD-10-CM

## 2020-01-28 LAB — CBC
HCT: 42.4 % (ref 36.0–46.0)
Hemoglobin: 14.4 g/dL (ref 12.0–15.0)
MCH: 30 pg (ref 26.0–34.0)
MCHC: 34 g/dL (ref 30.0–36.0)
MCV: 88.3 fL (ref 80.0–100.0)
Platelets: 344 10*3/uL (ref 150–400)
RBC: 4.8 MIL/uL (ref 3.87–5.11)
RDW: 12 % (ref 11.5–15.5)
WBC: 6.5 10*3/uL (ref 4.0–10.5)
nRBC: 0 % (ref 0.0–0.2)

## 2020-01-28 LAB — HOMOCYSTEINE: Homocysteine: 12.2 umol/L (ref 0.0–14.5)

## 2020-01-28 LAB — BETA-2-GLYCOPROTEIN I ABS, IGG/M/A
Beta-2 Glyco I IgG: <9 GPI IgG units (ref 0–20)
Beta-2-Glycoprotein I IgA: <9 GPI IgA units (ref 0–25)
Beta-2-Glycoprotein I IgM: <9 GPI IgM units (ref 0–32)

## 2020-01-28 LAB — CARDIOLIPIN ANTIBODIES, IGG, IGM, IGA
Anticardiolipin IgA: <9 APL U/mL (ref 0–11)
Anticardiolipin IgG: <9 GPL U/mL (ref 0–14)
Anticardiolipin IgM: 14 MPL U/mL — ABNORMAL HIGH (ref 0–12)

## 2020-01-28 LAB — BASIC METABOLIC PANEL
Anion gap: 9 (ref 5–15)
BUN: 10 mg/dL (ref 6–20)
CO2: 23 mmol/L (ref 22–32)
Calcium: 9.3 mg/dL (ref 8.9–10.3)
Chloride: 105 mmol/L (ref 98–111)
Creatinine, Ser: 0.9 mg/dL (ref 0.44–1.00)
GFR calc Af Amer: >60 mL/min (ref >60)
GFR calc non Af Amer: >60 mL/min (ref >60)
Glucose, Bld: 107 mg/dL — ABNORMAL HIGH (ref 70–99)
Potassium: 3.9 mmol/L (ref 3.5–5.1)
Sodium: 137 mmol/L (ref 135–145)

## 2020-01-28 LAB — ANCA TITERS
Atypical P-ANCA titer: <1:20 {titer}
C-ANCA: <1:20 {titer}
P-ANCA: <1:20 {titer}

## 2020-01-28 LAB — PROTEIN S ACTIVITY: Protein S Activity: 64 % (ref 63–140)

## 2020-01-28 LAB — ANTINUCLEAR ANTIBODIES, IFA: ANA Ab, IFA: NEGATIVE

## 2020-01-28 LAB — LUPUS ANTICOAGULANT PANEL
DRVVT: 33.3 s (ref 0.0–47.0)
PTT Lupus Anticoagulant: 34.9 s (ref 0.0–51.9)

## 2020-01-28 LAB — PROTEIN C, TOTAL: Protein C, Total: 88 % (ref 60–150)

## 2020-01-28 LAB — RPR: RPR Ser Ql: NONREACTIVE

## 2020-01-28 LAB — PROTEIN C ACTIVITY: Protein C Activity: 116 % (ref 73–180)

## 2020-01-28 LAB — PROTEIN S, TOTAL: Protein S Ag, Total: 104 % (ref 60–150)

## 2020-01-28 MED ORDER — LIDOCAINE HCL (PF) 1 % IJ SOLN
INTRAMUSCULAR | Status: AC
  Filled 2020-01-28: qty 5

## 2020-01-28 MED ORDER — SODIUM CHLORIDE 0.9 % IV SOLN: INTRAVENOUS | Status: DC

## 2020-01-28 MED ORDER — LIDOCAINE HCL (PF) 1 % IJ SOLN
INTRAMUSCULAR | Status: AC
  Filled 2020-01-28: qty 30

## 2020-01-28 MED ORDER — LIDOCAINE HCL 1 % IJ SOLN
5.0000 mL | Freq: Once | INTRAMUSCULAR | Status: DC
  Filled 2020-01-28: qty 5

## 2020-01-28 MED ORDER — DOCUSATE SODIUM 100 MG PO CAPS
100.0000 mg | ORAL_CAPSULE | Freq: Two times a day (BID) | ORAL | Status: DC | PRN
  Administered 2020-01-28: 100 mg via ORAL
  Filled 2020-01-28: qty 1

## 2020-01-28 NOTE — Progress Notes (Signed)
HD#2 Subjective:  Overnight Events: None  Patient evaluated at the bedside. Patient sitting up in bed states she is feeling much better. Reports previously had been taking synthroid 125 mcg daily for hypothyroidism but has not taken anything since moving to [place]. States she has been told she snores, often feels tired during the day. 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 Denies cold intolerance.  Objective:  Vital signs in last 24 hours: Vitals:   01/27/20 1742 01/27/20 2015 01/28/20 0030 01/28/20 0440  BP: 113/77 124/78 (!) 134/102 (!) 115/94  Pulse: 74 62 86 79  Resp: 18 18 16 17   Temp: 98.2 F (36.8 C) 98.7 F (37.1 C) 98.7 F (37.1 C) 98.4 F (36.9 C)  TempSrc: Oral Oral Oral Oral  SpO2: 98% 99% 99% 98%  Weight:      Height:       Supplemental O2: Room Air SpO2: 98 %   Physical Exam:  Physical Exam Constitutional:      Appearance: Normal appearance.  Eyes:     Extraocular Movements: Extraocular movements intact.     Pupils: Pupils are equal, round, and reactive to light.  Cardiovascular:     Pulses: Normal pulses.     Heart sounds: Normal heart sounds.  Skin:    General: Skin is warm and dry.  Neurological:     Mental Status: She is alert.     Comments: 4/5 RUE strength 5/5 on left. Mild subjective decreased sensation of V2 V3 on the right     Filed Weights   01/27/20 1425  Weight: 93.4 kg     Intake/Output Summary (Last 24 hours) at 01/28/2020 0540 Last data filed at 01/28/2020 0305 Gross per 24 hour  Intake 480 ml  Output --  Net 480 ml   Net IO Since Admission: 480 mL [01/28/20 0540]  Pertinent Labs: CBC Latest Ref Rng & Units 01/27/2020 01/26/2020 01/26/2020  WBC 4.0 - 10.5 K/uL 8.1 - 6.1  Hemoglobin 12.0 - 15.0 g/dL 01/28/2020 15.6(H) 14.9  Hematocrit 36 - 46 % 42.5 46.0 46.2(H)  Platelets 150 - 400 K/uL 318 - 326    CMP Latest Ref Rng & Units 01/27/2020 01/26/2020 01/26/2020   Glucose 70 - 99 mg/dL 01/28/2020) 921(J) 941(D)  BUN 6 - 20 mg/dL 12 11 9   Creatinine 0.44 - 1.00 mg/dL 408(X) 4.48(J  Sodium 135 - 145 mmol/L 138 139 139  Potassium 3.5 - 5.1 mmol/L 3.7 4.4 4.1  Chloride 98 - 111 mmol/L 104 107 105  CO2 22 - 32 mmol/L 23 - 25  Calcium 8.9 - 10.3 mg/dL 9.3 - 9.9  Total Protein 6.5 - 8.1 g/dL 7.0 - 8.0  Total Bilirubin 0.3 - 1.2 mg/dL 0.3 - 0.4  Alkaline Phos 38 - 126 U/L 89 - 101  AST 15 - 41 U/L 20 - 20  ALT 0 - 44 U/L 18 - 20    Imaging: VAS 8.56 TRANSCRANIAL DOPPLER W BUBBLES  Result Date: 01/27/2020  Transcranial Doppler with Bubble Indications: Stroke. History: Left frontal cortical infarct in setting of previous left frontal infarct, felt to be embolic without source identified. Comparison Study: No prior study Performing Technologist: [place] MHA, RDMS, RVT, RDCS  Examination Guidelines: A complete evaluation includes B-mode imaging, spectral Doppler, color Doppler, and power Doppler as needed of all accessible portions of each vessel. Bilateral testing is considered an integral part of a complete examination. Limited examinations for  reoccurring indications may be performed as noted.  Summary:  A vascular evaluation was performed. The right middle cerebral artery was studied. An IV was inserted into the patient's left forearm. Verbal informed consent was obtained.  Few HITS heard after Valsalva release, suggestive of trivial, clinically insignificant right to left intracardiac shunt. *See table(s) above for TCD measurements and observations.    Preliminary    ECHOCARDIOGRAM COMPLETE  Result Date: 01/27/2020    ECHOCARDIOGRAM REPORT   Patient Name:   Maria Porter Date of Exam: 01/27/2020 Medical Rec #:  [id number]       Height: Accession #:    [id number]      Weight: Date of Birth:  08-08-1975       BSA: Patient Age:    44 years        BP:           123/97 mmHg Patient Gender: F               HR:           79 bpm. Exam Location:  Inpatient  Procedure: 2D Echo, Cardiac Doppler, Color Doppler and Intracardiac            Opacification Agent Indications:    CVA  History:        Patient has no prior history of Echocardiogram examinations.                 Stroke, Arrythmias:Abnormal EKG; Risk Factors:Current Smoker and                 Hypertension.  Sonographer:    Lavenia Atlas Referring Phys: [id number] Marquita Palms VINCENT IMPRESSIONS  1. Left ventricular ejection fraction, by estimation, is 55 to 60%. The left ventricle has normal function. The left ventricle has no regional wall motion abnormalities. Left ventricular diastolic parameters were normal.  2. Right ventricular systolic function is normal. The right ventricular size is normal. Tricuspid regurgitation signal is inadequate for assessing PA pressure.  3. The mitral valve is normal in structure. Trivial mitral valve regurgitation. No evidence of mitral stenosis.  4. The aortic valve is grossly normal. Aortic valve regurgitation is not visualized. No aortic stenosis is present.  5. The inferior vena cava is normal in size with greater than 50% respiratory variability, suggesting right atrial pressure of 3 mmHg. Comparison(s): No prior Echocardiogram. Conclusion(s)/Recommendation(s): Normal biventricular function without evidence of hemodynamically significant valvular heart disease. FINDINGS  Left Ventricle: Left ventricular ejection fraction, by estimation, is 55 to 60%. The left ventricle has normal function. The left ventricle has no regional wall motion abnormalities. Definity contrast agent was given IV to delineate the left ventricular  endocardial borders. The left ventricular internal cavity size was normal in size. There is borderline left ventricular hypertrophy. Left ventricular diastolic parameters were normal. Right Ventricle: The right ventricular size is normal. No increase in right ventricular wall thickness. Right ventricular systolic function is normal. Tricuspid regurgitation  signal is inadequate for assessing PA pressure. Left Atrium: Left atrial size was normal in size. Right Atrium: Right atrial size was normal in size. Pericardium: There is no evidence of pericardial effusion. Mitral Valve: The mitral valve is normal in structure. Trivial mitral valve regurgitation. No evidence of mitral valve stenosis. Tricuspid Valve: The tricuspid valve is normal in structure. Tricuspid valve regurgitation is trivial. No evidence of tricuspid stenosis. Aortic Valve: The aortic valve is grossly normal. Aortic valve regurgitation is not visualized. No aortic stenosis is present. Pulmonic Valve:  The pulmonic valve was not well visualized. Pulmonic valve regurgitation is not visualized. Aorta: The aortic root, ascending aorta and aortic arch are all structurally normal, with no evidence of dilitation or obstruction. Venous: The inferior vena cava is normal in size with greater than 50% respiratory variability, suggesting right atrial pressure of 3 mmHg. IAS/Shunts: No atrial level shunt detected by color flow Doppler.  LEFT VENTRICLE PLAX 2D LVIDd:         3.40 cm  Diastology LVIDs:         2.40 cm  LV e' medial:    10.30 cm/s LV PW:         1.20 cm  LV E/e' medial:  6.4 LV IVS:        1.20 cm  LV e' lateral:   7.83 cm/s LVOT diam:     2.00 cm  LV E/e' lateral: 8.4 LV SV:         63 LVOT Area:     3.14 cm  RIGHT VENTRICLE RV Basal diam:  2.90 cm RV S prime:     11.70 cm/s TAPSE (M-mode): 2.6 cm LEFT ATRIUM             RIGHT ATRIUM LA diam:        2.50 cm RA Area:     10.00 cm LA Vol (A2C):   27.9 ml RA Volume:   20.00 ml LA Vol (A4C):   16.3 ml LA Biplane Vol: 23.1 ml  AORTIC VALVE LVOT Vmax:   111.00 cm/s LVOT Vmean:  74.500 cm/s LVOT VTI:    0.202 m  AORTA Ao Root diam: 2.90 cm MITRAL VALVE MV Area (PHT): 3.03 cm    SHUNTS MV Decel Time: 250 msec    Systemic VTI:  0.20 m MV E velocity: 66.00 cm/s  Systemic Diam: 2.00 cm MV A velocity: 60.00 cm/s MV E/A ratio:  1.10 Jodelle Red MD  Electronically signed by Jodelle Red MD Signature Date/Time: 01/27/2020/11:21:54 AM    Final    VAS [place] LOWER EXTREMITY VENOUS (DVT)  Result Date: 01/27/2020  Lower Venous DVTStudy Indications: Stroke.  Comparison Study: No prior study Performing Technologist: Gertie Fey MHA, RDMS, RVT, RDCS  Examination Guidelines: A complete evaluation includes B-mode imaging, spectral Doppler, color Doppler, and power Doppler as needed of all accessible portions of each vessel. Bilateral testing is considered an integral part of a complete examination. Limited examinations for reoccurring indications may be performed as noted. The reflux portion of the exam is performed with the patient in reverse Trendelenburg.  +---------+---------------+---------+-----------+----------+--------------+  RIGHT     Compressibility Phasicity Spontaneity Properties Thrombus Aging  +---------+---------------+---------+-----------+----------+--------------+  CFV       Full            Yes       Yes                                    +---------+---------------+---------+-----------+----------+--------------+  SFJ       Full                                                             +---------+---------------+---------+-----------+----------+--------------+  FV Prox   Full                                                             +---------+---------------+---------+-----------+----------+--------------+  FV Mid    Full                                                             +---------+---------------+---------+-----------+----------+--------------+  FV Distal Full                                                             +---------+---------------+---------+-----------+----------+--------------+  PFV       Full                                                             +---------+---------------+---------+-----------+----------+--------------+  POP       Full            Yes       Yes                                     +---------+---------------+---------+-----------+----------+--------------+  PTV       Full                                                             +---------+---------------+---------+-----------+----------+--------------+  PERO      Full                                                             +---------+---------------+---------+-----------+----------+--------------+   +---------+---------------+---------+-----------+----------+--------------+  LEFT      Compressibility Phasicity Spontaneity Properties Thrombus Aging  +---------+---------------+---------+-----------+----------+--------------+  CFV       Full            Yes       Yes                                    +---------+---------------+---------+-----------+----------+--------------+  SFJ       Full                                                             +---------+---------------+---------+-----------+----------+--------------+  FV Prox   Full                                                             +---------+---------------+---------+-----------+----------+--------------+  FV Mid    Full                                                             +---------+---------------+---------+-----------+----------+--------------+  FV Distal Full                                                             +---------+---------------+---------+-----------+----------+--------------+  PFV       Full                                                             +---------+---------------+---------+-----------+----------+--------------+  POP       Full            Yes       Yes                                    +---------+---------------+---------+-----------+----------+--------------+  PTV       Full                                                             +---------+---------------+---------+-----------+----------+--------------+  PERO      Full                                                              +---------+---------------+---------+-----------+----------+--------------+     Summary: RIGHT: - There is no evidence of deep vein thrombosis in the lower extremity.  - No cystic structure found in the popliteal fossa.  LEFT: - There is no evidence of deep vein thrombosis in the lower extremity.  - No cystic structure found in the popliteal fossa.  *See table(s) above for measurements and observations. Electronically signed by Coral Else MD on 01/27/2020 at 9:32:53 PM.    Final     Assessment/Plan:   Principal Problem:   Acute CVA (cerebrovascular accident) [facility]) Active Problems:   Tobacco use disorder   Hyperlipemia   Obesity   Patient Summary: Maria Porter is a 44 year old female with PMHx of prior stroke and tobacco use disorder admitted for left frontal lobe ischemic infarct.  Acute left frontal lobe ischemic infarct: Presented with acute onset right sided weakness and aphasia. MRI with new ischemic left frontal cortical and subcortical stroke. Her neurologic exam continues to improved with  improvement in sensation to light touch in right V2 and V3 segments and RUE strength. Risk factors include prior stroke and tobacco use disorder. Without  DVT in bilateral lower extremities lower extremity doppler. Transcranial doppler with bubble with clinically insignificant right to left intracardiac shunt. Hypercoagulable workup given young age. Patient endorses symptoms of OSA with STOP-BANG of 3. May be candidate for SLEEP SMART study. Reached out to Dr. Pearlean BrownieSethi.  - Neurology consulted, appreciate their recommendations - Aspirin 81mg  daily, start plavix 75mg  daily x 21 days after LP - Atorvastatin 80mg  daily  - F/u hypercoagulable work up - Possible LP today to evaluate for CNS vasculitis - TEE scheduled for 01/29/2020 - PT/OT recs: outpatient PT  Tobacco use disorder: Discussed recommendation for smoking cessation for which patient is currently in contemplative stage.  -  Nicotine patch 14mg  daily during hospitalization - Encourage smoking cessation   Hypothyroidism TSH 20.243, normal T4. She reports that she was previously on 125 mcg daily with levothyroxine, but [place]. She is asymptomatic/ Lab consistent with subclinical hypothyroidism. Will hold on thyroid supplementation for now, may need reevolution in the outpatient setting if she becomes symptomatic.  Diet: HH Fluid/Electrolytes: Monitor and replete prn Code status: FULL  Prior to Admission Living Arrangement: Home Anticipated Discharge Location: Home  Barriers to Discharge: Continued medical management  Dispo: Anticipated discharge in approximately 1 day(s  Quincy SimmondsLiang, Angelic Schnelle, MD 01/28/2020, 5:40 AM Pager: [phone]  Please contact the on call pager after 5 pm and on weekends at [phone].

## 2020-01-28 NOTE — H&P (View-Only) (Signed)
STROKE TEAM PROGRESS NOTE   INTERVAL HISTORY Daughter and RN at bedside. Pt is doing well. Asymptomatic now. TEE tomorrow. TCD and DVT unremarkable. Planned for LP at bedside today, however, pt has excessive adipose tissue at the back, not able to feel landmark, will request fluoro guided LP.    Vitals:   01/27/20 2015 01/28/20 0030 01/28/20 0440 01/28/20 0753  BP: 124/78 (!) 134/102 (!) 115/94 119/79  Pulse: 62 86 79 83  Resp: _0 Temp: 98.7 F (37.1 C) 98.7 F (37.1 C) 98.4 F (36.9 C) 98.7 F (37.1 C)  TempSrc: Oral Oral Oral Oral  SpO2: 99% 99% 98% 95%  Weight:      Height:       CBC:  Recent Labs  Lab 01/26/20 1404 01/26/20 1409 01/27/20 0500 01/28/20 0523  WBC 6.1   < > 8.1 6.5  NEUTROABS 3.2  --   --   --   HGB 14.9   < > 13.8 14.4  HCT 46.2*   < > 42.5 42.4  MCV 90.1   < > 89.7 88.3  PLT 326   < > 318 344   < > = values in this interval not displayed.   Basic Metabolic Panel:  Recent Labs  Lab 01/27/20 0500 01/28/20 0523  NA 138 137  K 3.7 3.9  CL 104 105  CO2 23 23  GLUCOSE 120* 107*  BUN 12 10  CREATININE 1.03* 0.90  CALCIUM 9.3 9.3   Lipid Panel:  Recent Labs  Lab 01/27/20 0500  CHOL 181  TRIG 69  HDL 42  CHOLHDL 4.3  VLDL 14  LDLCALC 125*   HgbA1c:  Recent Labs  Lab 01/27/20 1011  HGBA1C 5.8*   Urine Drug Screen: No results for input(s): LABOPIA, COCAINSCRNUR, LABBENZ, AMPHETMU, THCU, LABBARB in the last 168 hours.  Alcohol Level No results for input(s): ETH in the last 168 hours.  IMAGING past 24 hours VAS [place] TRANSCRANIAL DOPPLER W BUBBLES  Result Date: 01/28/2020  Transcranial Doppler with Bubble Indications: Stroke. History: Left frontal cortical infarct in setting of previous left frontal infarct, felt to be embolic without source identified. Comparison Study: No prior study Performing Technologist: Maudry Mayhew MHA, RDMS, RVT, RDCS  Examination Guidelines: A complete evaluation includes B-mode imaging, spectral  Doppler, color Doppler, and power Doppler as needed of all accessible portions of each vessel. Bilateral testing is considered an integral part of a complete examination. Limited examinations for reoccurring indications may be performed as noted.  Summary:  A vascular evaluation was performed. The right middle cerebral artery was studied. An IV was inserted into the patient's left forearm. Verbal informed consent was obtained.  Few HITS heard after Valsalva release, suggestive of trivial, clinically insignificant right to left intracardiac shunt. Positive TCD Bubble study indicative of a trivial clinically insignificant right to left shunt *See table(s) above for TCD measurements and observations.  Diagnosing physician: Antony Contras MD Electronically signed by Antony Contras MD on 01/28/2020 at 8:20:58 AM.    Final    VAS [place] LOWER EXTREMITY VENOUS (DVT)  Result Date: 01/27/2020  Lower Venous DVTStudy Indications: Stroke.  Comparison Study: No prior study Performing Technologist: Maudry Mayhew MHA, RDMS, RVT, RDCS  Examination Guidelines: A complete evaluation includes B-mode imaging, spectral Doppler, color Doppler, and power Doppler as needed of all accessible portions of each vessel. Bilateral testing is considered an integral part of a complete examination. Limited examinations for reoccurring indications may be performed as noted.  The reflux portion of the exam is performed with the patient in reverse Trendelenburg.  +---------+---------------+---------+-----------+----------+--------------+ RIGHT    CompressibilityPhasicitySpontaneityPropertiesThrombus Aging +---------+---------------+---------+-----------+----------+--------------+ CFV      Full           Yes      Yes                                 +---------+---------------+---------+-----------+----------+--------------+ SFJ      Full                                                         +---------+---------------+---------+-----------+----------+--------------+ FV Prox  Full                                                        +---------+---------------+---------+-----------+----------+--------------+ FV Mid   Full                                                        +---------+---------------+---------+-----------+----------+--------------+ FV DistalFull                                                        +---------+---------------+---------+-----------+----------+--------------+ PFV      Full                                                        +---------+---------------+---------+-----------+----------+--------------+ POP      Full           Yes      Yes                                 +---------+---------------+---------+-----------+----------+--------------+ PTV      Full                                                        +---------+---------------+---------+-----------+----------+--------------+ PERO     Full                                                        +---------+---------------+---------+-----------+----------+--------------+   +---------+---------------+---------+-----------+----------+--------------+ LEFT     CompressibilityPhasicitySpontaneityPropertiesThrombus Aging +---------+---------------+---------+-----------+----------+--------------+ CFV      Full           Yes  Yes                                 +---------+---------------+---------+-----------+----------+--------------+ SFJ      Full                                                        +---------+---------------+---------+-----------+----------+--------------+ FV Prox  Full                                                        +---------+---------------+---------+-----------+----------+--------------+ FV Mid   Full                                                         +---------+---------------+---------+-----------+----------+--------------+ FV DistalFull                                                        +---------+---------------+---------+-----------+----------+--------------+ PFV      Full                                                        +---------+---------------+---------+-----------+----------+--------------+ POP      Full           Yes      Yes                                 +---------+---------------+---------+-----------+----------+--------------+ PTV      Full                                                        +---------+---------------+---------+-----------+----------+--------------+ PERO     Full                                                        +---------+---------------+---------+-----------+----------+--------------+     Summary: RIGHT: - There is no evidence of deep vein thrombosis in the lower extremity.  - No cystic structure found in the popliteal fossa.  LEFT: - There is no evidence of deep vein thrombosis in the lower extremity.  - No cystic structure found in the popliteal fossa.  *See table(s) above for measurements and observations. Electronically signed by Harold Barban MD on 01/27/2020 at 9:32:53 PM.    Final  PHYSICAL EXAM    Temp:  [98.2 F (36.8 C)-98.7 F (37.1 C)] 98.7 F (37.1 C) (09/23 0753) Pulse Rate:  [62-86] 83 (09/23 0753) Resp:  [16-18] 18 (09/23 0753) BP: (113-134)/(77-102) 119/79 (09/23 0753) SpO2:  [95 %-99 %] 95 % (09/23 0753)  General - Well nourished, well developed, in no apparent distress.  Ophthalmologic - fundi not visualized due to noncooperation.  Cardiovascular - Regular rhythm and rate.  Mental Status -  Level of arousal and orientation to time, place, and person were intact. Language including expression, naming, repetition, comprehension was assessed and found intact. Attention span and concentration were normal. Fund of Knowledge was assessed  and was intact.  Cranial Nerves II - XII - II - Visual field intact OU. III, IV, VI - Extraocular movements intact. V - Facial sensation intact bilaterally. VII - Facial movement intact bilaterally. VIII - Hearing & vestibular intact bilaterally. X - Palate elevates symmetrically. XI - Chin turning & shoulder shrug intact bilaterally. XII - Tongue protrusion intact.  Motor Strength - The patient's strength was normal in all extremities and pronator drift was absent.  Bulk was normal and fasciculations were absent.   Motor Tone - Muscle tone was assessed at the neck and appendages and was normal.  Reflexes - The patient's reflexes were symmetrical in all extremities and she had no pathological reflexes.  Sensory - Light touch, temperature/pinprick were assessed and were symmetrical.    Coordination - The patient had normal movements in the hands and feet with no ataxia or dysmetria.  Tremor was absent.  Gait and Station - deferred.   ASSESSMENT/PLAN Maria Porter is a 44 y.o. female with history of L frontal stroke presenting with aphasia and R sided weakness that progressed from acute R face and arm numbness with inability to speak.   Stroke:  L frontal cortical infarct in setting of previous left frontal infarct, felt to be embolic without source identified. Workup underway  Code Stroke CT head subacute L frontal lobe infarct ASPECTS 9    CTA head & neck Unremarkable   CT perfusion Unremarkable   MRI  L frontal lobe cortical and subcortical infarct. Multiple old B frontal lobe and inferior L cerebellar infarcts  MRA head Unremarkable   MRA neck Unremarkable   EEG slow, no seizure  2D Echo EF 55-60%. No source of embolus   TCD w/ bubble few HITS after valsalva->trivial clinically insignificant   LE doppler no DVT  TEE to look for embolic source     Fluoro guided LP to rule out CNS vasculitis    LDL 125  HgbA1c 5.8  Hypercoagulable labs neg except  anticardiolipin IgM 14  ESR and CRP neg, B12 332  VTE prophylaxis - Lovenox 40 mg sq daily   aspirin 81 mg daily prior to admission, now on aspirin 81 mg daily. Continue DAPT x 3 weeks after LP then plavix alone  Therapy recommendations:  OP PT, OP OT, OP SLP  Disposition:  Plans to live w/ daughter in [place] following d/c  Hyperlipidemia  Home meds:  No statin  Now on lipitor 40  LDL 125, goal < 70  Continue statin at discharge  Tobacco abuse  Current smoker  Smoking cessation counseling provided  Nicotine patch   Pt is willing to quit   Other Stroke Risk Factors  Hx stroke/TIA - prior B frontal lobe infarct and left cerebellar infarcts on imaging this admission (details not available in Epic).    Obesity, BMI  35.34, recommend weight loss, health diet and regular exercise  Snores. Possible obstructive sleep apnea. Needs OP eval. Enrolled on Sleep Smart trial  Other Active Problems  TSH 20.243, free T4 normal. Previously on synthroid. On hold as subclinical.   Hospital day # 2   Rosalin Hawking, MD PhD Stroke Neurology 01/28/2020 3:25 PM  To contact Stroke Continuity provider, please refer to [email or web address] After hours, contact General Neurology

## 2020-01-28 NOTE — Progress Notes (Signed)
   [facility] has been requested to perform a transesophageal echocardiogram on Maria Porter for stroke.  After careful review of history and examination, the risks and benefits of transesophageal echocardiogram have been explained including risks of esophageal damage, perforation (1:10,000 risk), bleeding, pharyngeal hematoma as well as other potential complications associated with conscious sedation including aspiration, arrhythmia, respiratory failure and death. Alternatives to treatment were discussed, questions were answered. Patient is willing to proceed.   Procedure scheduled for 01/29/2020 at 8:00am with Dr. Bjorn Pippin. Will place orders.  Corrin Parker, PA-C 01/28/2020 3:36 PM

## 2020-01-28 NOTE — TOC Initial Note (Addendum)
Transition of Care [facility]) - Initial/Assessment Note    Patient Details  Name: Kylea Berrong MRN: [id number] Date of Birth: [birth year]  Transition of Care [facility]) CM/SW Contact:    Lockie Pares, RN Phone Number: 01/28/2020, 2:05 PM  Clinical Narrative:                 Admitted for CVA, has past  stroke according to MRI. Patient states she is going to live with her daughter in [place] when she leaves hospital. PT and OT recommend outpatient follow up, as well as SLP.  Patient uninsured at present.  No recommended devices thus far.  CM will follow for needs. Will need scripts printed upon discharge for OT PT and SLP so that patient can bring to OP place of her choosing ( patient is planning on staying with her daughter in [place] post- hospitalization).  Expected Discharge Plan: Home/Self Care Barriers to Discharge: Continued Medical Work up   Patient Goals and CMS Choice        Expected Discharge Plan and Services Expected Discharge Plan: Home/Self Care   Discharge Planning Services: CM Consult   Living arrangements for the past 2 months: Apartment                                      Prior Living Arrangements/Services Living arrangements for the past 2 months: Apartment Lives with:: Self Patient language and need for interpreter reviewed:: Yes        Need for Family Participation in Patient Care: Yes (Comment) Care giver support system in place?: Yes (comment)   Criminal Activity/Legal Involvement Pertinent to Current Situation/Hospitalization: No - Comment as needed  Activities of Daily Living Home Assistive Devices/Equipment: Blood pressure cuff, Eyeglasses ADL Screening (condition at time of admission) Patient's cognitive ability adequate to safely complete daily activities?: Yes Is the patient deaf or have difficulty hearing?: No Does the patient have difficulty seeing, even when wearing glasses/contacts?: No Does the patient have difficulty  concentrating, remembering, or making decisions?: No Patient able to express need for assistance with ADLs?: Yes Does the patient have difficulty dressing or bathing?: No Independently performs ADLs?: Yes (appropriate for developmental age) Does the patient have difficulty walking or climbing stairs?: No Weakness of Legs: None Weakness of Arms/Hands: None  Permission Sought/Granted                  Emotional Assessment       Orientation: : Oriented to Self, Oriented to Place, Oriented to  Time, Oriented to Situation Alcohol / Substance Use: Not Applicable Psych Involvement: No (comment)  Admission diagnosis:  Acute right-sided weakness [R53.1] Acute CVA (cerebrovascular accident) [facility]) [I63.9] Patient Active Problem List   Diagnosis Date Noted  . Tobacco use disorder 01/27/2020  . Hyperlipemia 01/27/2020  . Obesity 01/27/2020  . Acute CVA (cerebrovascular accident) (HCC) 01/26/2020   PCP:  Patient, No Pcp Per Pharmacy:   [facility] [phone] - [place], [place] - [phone] AT [facility] [address] & [address] [place] [address] Phone: [phone] Fax: [phone]  [facility] [place], [place] - [address] [place] [address] Phone: [phone] Fax: [phone]     Social Determinants of Health (SDOH) Interventions    Readmission Risk Interventions No flowsheet data found.

## 2020-01-28 NOTE — Evaluation (Signed)
Speech Language Pathology Evaluation Patient Details Name: Maria Porter MRN: [id number] DOB: 04-30-1976 Today's Date: 01/28/2020 Time: 2330-0762 SLP Time Calculation (min) (ACUTE ONLY): 17 min  Problem List:  Patient Active Problem List   Diagnosis Date Noted  . Tobacco use disorder 01/27/2020  . Hyperlipemia 01/27/2020  . Obesity 01/27/2020  . Acute CVA (cerebrovascular accident) (HCC) 01/26/2020   Past Medical History:  Past Medical History:  Diagnosis Date  . Stroke [facility])    Past Surgical History: History reviewed. No pertinent surgical history. HPI:      Assessment / Plan / Recommendation Clinical Impression  Therapist initiated the cerebellar cognitive affective/Schmahann syndrome assessment. She exhibits 100% intelligibility in conversation with min-mild dysarthria marked by phonemic distortions. She exhibited emergent awareness of her deficits and difficulty with subtests on assessment. She started to cry and verbalized "I can't believe I am having difficulty on this test. 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 She is fluent in conversation with occasional dysnomia. Evaluation ended prematurely due to pt becoming upset. Recommend continued ST while in acute and at outpatient. She states she plans to go to her daughter's house in [place], [place].            SLP Assessment  SLP Recommendation/Assessment: Patient needs continued Speech Lanaguage Pathology Services SLP Visit Diagnosis: Cognitive communication deficit (R41.841);Dysarthria and anarthria (R47.1)    Follow Up Recommendations  Outpatient SLP    Frequency and Duration min 2x/week  1 week      SLP Evaluation Cognition  Overall Cognitive Status: Within Functional Limits for tasks assessed Arousal/Alertness: Awake/alert Orientation Level: Oriented X4 Attention: Sustained Sustained Attention: Appears intact Memory:  (To be assessed) Awareness:  Impaired Awareness Impairment: Anticipatory impairment (will assess further) Problem Solving: Impaired Problem Solving Impairment:  (executive functioning) Executive Function: Organizing;Sequencing;Reasoning Safety/Judgment: Appears intact (question higher level re: work)       Nurse, adult Overall Auditory Comprehension: Appears within functional limits for tasks assessed Visual Recognition/Discrimination Discrimination: Not tested Reading Comprehension Reading Status:  (TBA)    Expression Expression Primary Mode of Expression: Verbal Verbal Expression Overall Verbal Expression: Impaired Initiation: No impairment Level of Generative/Spontaneous Verbalization: Conversation Naming: Impairment Confrontation: Within functional limits Divergent: 0-24% accurate Pragmatics: No impairment Written Expression Dominant Hand: Left Written Expression:  (TBA)   Oral / Motor  Oral Motor/Sensory Function Overall Oral Motor/Sensory Function: Within functional limits Motor Speech Overall Motor Speech: Impaired Respiration: Within functional limits Phonation: Normal Resonance: Within functional limits Articulation: Impaired Level of Impairment: Conversation Intelligibility: Intelligible Motor Planning: Witnin functional limits   GO                    Royce Macadamia 01/28/2020, 1:50 PM  Breck Coons Aleynah Rocchio M.Ed Nurse, children's [phone] Office [phone]

## 2020-01-28 NOTE — Progress Notes (Signed)
STROKE TEAM PROGRESS NOTE   INTERVAL HISTORY Daughter and RN at bedside. Pt is doing well. Asymptomatic now. TEE tomorrow. TCD and DVT unremarkable. Planned for LP at bedside today, however, pt has excessive adipose tissue at the back, not able to feel landmark, will request fluoro guided LP.    Vitals:   01/27/20 2015 01/28/20 0030 01/28/20 0440 01/28/20 0753  BP: 124/78 (!) 134/102 (!) 115/94 119/79  Pulse: 62 86 79 83  Resp: _0 Temp: 98.7 F (37.1 C) 98.7 F (37.1 C) 98.4 F (36.9 C) 98.7 F (37.1 C)  TempSrc: Oral Oral Oral Oral  SpO2: 99% 99% 98% 95%  Weight:      Height:       CBC:  Recent Labs  Lab 01/26/20 1404 01/26/20 1409 01/27/20 0500 01/28/20 0523  WBC 6.1   < > 8.1 6.5  NEUTROABS 3.2  --   --   --   HGB 14.9   < > 13.8 14.4  HCT 46.2*   < > 42.5 42.4  MCV 90.1   < > 89.7 88.3  PLT 326   < > 318 344   < > = values in this interval not displayed.   Basic Metabolic Panel:  Recent Labs  Lab 01/27/20 0500 01/28/20 0523  NA 138 137  K 3.7 3.9  CL 104 105  CO2 23 23  GLUCOSE 120* 107*  BUN 12 10  CREATININE 1.03* 0.90  CALCIUM 9.3 9.3   Lipid Panel:  Recent Labs  Lab 01/27/20 0500  CHOL 181  TRIG 69  HDL 42  CHOLHDL 4.3  VLDL 14  LDLCALC 125*   HgbA1c:  Recent Labs  Lab 01/27/20 1011  HGBA1C 5.8*   Urine Drug Screen: No results for input(s): LABOPIA, COCAINSCRNUR, LABBENZ, AMPHETMU, THCU, LABBARB in the last 168 hours.  Alcohol Level No results for input(s): ETH in the last 168 hours.  IMAGING past 24 hours VAS [place] TRANSCRANIAL DOPPLER W BUBBLES  Result Date: 01/28/2020  Transcranial Doppler with Bubble Indications: Stroke. History: Left frontal cortical infarct in setting of previous left frontal infarct, felt to be embolic without source identified. Comparison Study: No prior study Performing Technologist: Maudry Mayhew MHA, RDMS, RVT, RDCS  Examination Guidelines: A complete evaluation includes B-mode imaging, spectral  Doppler, color Doppler, and power Doppler as needed of all accessible portions of each vessel. Bilateral testing is considered an integral part of a complete examination. Limited examinations for reoccurring indications may be performed as noted.  Summary:  A vascular evaluation was performed. The right middle cerebral artery was studied. An IV was inserted into the patient's left forearm. Verbal informed consent was obtained.  Few HITS heard after Valsalva release, suggestive of trivial, clinically insignificant right to left intracardiac shunt. Positive TCD Bubble study indicative of a trivial clinically insignificant right to left shunt *See table(s) above for TCD measurements and observations.  Diagnosing physician: Antony Contras MD Electronically signed by Antony Contras MD on 01/28/2020 at 8:20:58 AM.    Final    VAS [place] LOWER EXTREMITY VENOUS (DVT)  Result Date: 01/27/2020  Lower Venous DVTStudy Indications: Stroke.  Comparison Study: No prior study Performing Technologist: Maudry Mayhew MHA, RDMS, RVT, RDCS  Examination Guidelines: A complete evaluation includes B-mode imaging, spectral Doppler, color Doppler, and power Doppler as needed of all accessible portions of each vessel. Bilateral testing is considered an integral part of a complete examination. Limited examinations for reoccurring indications may be performed as noted.  The reflux portion of the exam is performed with the patient in reverse Trendelenburg.  +---------+---------------+---------+-----------+----------+--------------+ RIGHT    CompressibilityPhasicitySpontaneityPropertiesThrombus Aging +---------+---------------+---------+-----------+----------+--------------+ CFV      Full           Yes      Yes                                 +---------+---------------+---------+-----------+----------+--------------+ SFJ      Full                                                         +---------+---------------+---------+-----------+----------+--------------+ FV Prox  Full                                                        +---------+---------------+---------+-----------+----------+--------------+ FV Mid   Full                                                        +---------+---------------+---------+-----------+----------+--------------+ FV DistalFull                                                        +---------+---------------+---------+-----------+----------+--------------+ PFV      Full                                                        +---------+---------------+---------+-----------+----------+--------------+ POP      Full           Yes      Yes                                 +---------+---------------+---------+-----------+----------+--------------+ PTV      Full                                                        +---------+---------------+---------+-----------+----------+--------------+ PERO     Full                                                        +---------+---------------+---------+-----------+----------+--------------+   +---------+---------------+---------+-----------+----------+--------------+ LEFT     CompressibilityPhasicitySpontaneityPropertiesThrombus Aging +---------+---------------+---------+-----------+----------+--------------+ CFV      Full           Yes  Yes                                 +---------+---------------+---------+-----------+----------+--------------+ SFJ      Full                                                        +---------+---------------+---------+-----------+----------+--------------+ FV Prox  Full                                                        +---------+---------------+---------+-----------+----------+--------------+ FV Mid   Full                                                         +---------+---------------+---------+-----------+----------+--------------+ FV DistalFull                                                        +---------+---------------+---------+-----------+----------+--------------+ PFV      Full                                                        +---------+---------------+---------+-----------+----------+--------------+ POP      Full           Yes      Yes                                 +---------+---------------+---------+-----------+----------+--------------+ PTV      Full                                                        +---------+---------------+---------+-----------+----------+--------------+ PERO     Full                                                        +---------+---------------+---------+-----------+----------+--------------+     Summary: RIGHT: - There is no evidence of deep vein thrombosis in the lower extremity.  - No cystic structure found in the popliteal fossa.  LEFT: - There is no evidence of deep vein thrombosis in the lower extremity.  - No cystic structure found in the popliteal fossa.  *See table(s) above for measurements and observations. Electronically signed by Harold Barban MD on 01/27/2020 at 9:32:53 PM.    Final  PHYSICAL EXAM    Temp:  [98.2 F (36.8 C)-98.7 F (37.1 C)] 98.7 F (37.1 C) (09/23 0753) Pulse Rate:  [62-86] 83 (09/23 0753) Resp:  [16-18] 18 (09/23 0753) BP: (113-134)/(77-102) 119/79 (09/23 0753) SpO2:  [95 %-99 %] 95 % (09/23 0753)  General - Well nourished, well developed, in no apparent distress.  Ophthalmologic - fundi not visualized due to noncooperation.  Cardiovascular - Regular rhythm and rate.  Mental Status -  Level of arousal and orientation to time, place, and person were intact. Language including expression, naming, repetition, comprehension was assessed and found intact. Attention span and concentration were normal. Fund of Knowledge was assessed  and was intact.  Cranial Nerves II - XII - II - Visual field intact OU. III, IV, VI - Extraocular movements intact. V - Facial sensation intact bilaterally. VII - Facial movement intact bilaterally. VIII - Hearing & vestibular intact bilaterally. X - Palate elevates symmetrically. XI - Chin turning & shoulder shrug intact bilaterally. XII - Tongue protrusion intact.  Motor Strength - The patient's strength was normal in all extremities and pronator drift was absent.  Bulk was normal and fasciculations were absent.   Motor Tone - Muscle tone was assessed at the neck and appendages and was normal.  Reflexes - The patient's reflexes were symmetrical in all extremities and she had no pathological reflexes.  Sensory - Light touch, temperature/pinprick were assessed and were symmetrical.    Coordination - The patient had normal movements in the hands and feet with no ataxia or dysmetria.  Tremor was absent.  Gait and Station - deferred.   ASSESSMENT/PLAN Ms. Zyaira Vejar is a 44 y.o. female with history of L frontal stroke presenting with aphasia and R sided weakness that progressed from acute R face and arm numbness with inability to speak.   Stroke:  L frontal cortical infarct in setting of previous left frontal infarct, felt to be embolic without source identified. Workup underway  Code Stroke CT head subacute L frontal lobe infarct ASPECTS 9    CTA head & neck Unremarkable   CT perfusion Unremarkable   MRI  L frontal lobe cortical and subcortical infarct. Multiple old B frontal lobe and inferior L cerebellar infarcts  MRA head Unremarkable   MRA neck Unremarkable   EEG slow, no seizure  2D Echo EF 55-60%. No source of embolus   TCD w/ bubble few HITS after valsalva->trivial clinically insignificant   LE doppler no DVT  TEE to look for embolic source     Fluoro guided LP to rule out CNS vasculitis    LDL 125  HgbA1c 5.8  Hypercoagulable labs neg except  anticardiolipin IgM 14  ESR and CRP neg, B12 332  VTE prophylaxis - Lovenox 40 mg sq daily   aspirin 81 mg daily prior to admission, now on aspirin 81 mg daily. Continue DAPT x 3 weeks after LP then plavix alone  Therapy recommendations:  OP PT, OP OT, OP SLP  Disposition:  Plans to live w/ daughter in [place] following d/c  Hyperlipidemia  Home meds:  No statin  Now on lipitor 40  LDL 125, goal < 70  Continue statin at discharge  Tobacco abuse  Current smoker  Smoking cessation counseling provided  Nicotine patch   Pt is willing to quit   Other Stroke Risk Factors  Hx stroke/TIA - prior B frontal lobe infarct and left cerebellar infarcts on imaging this admission (details not available in Epic).    Obesity, BMI  35.34, recommend weight loss, health diet and regular exercise  Snores. Possible obstructive sleep apnea. Needs OP eval. Enrolled on Sleep Smart trial  Other Active Problems  TSH 20.243, free T4 normal. Previously on synthroid. On hold as subclinical.   Hospital day # 2   Rosalin Hawking, MD PhD Stroke Neurology 01/28/2020 3:25 PM  To contact Stroke Continuity provider, please refer to [email or web address] After hours, contact General Neurology

## 2020-01-28 NOTE — Progress Notes (Signed)
Physical Therapy Treatment Patient Details Name: Maria Porter MRN: [id number] DOB: 02-28-1976 Today's Date: 01/28/2020    History of Present Illness Pt is a 44 y/o female admitted secondary to R UE and LE weakness. Found to have L frontal lobe infarct.     PT Comments    The pt is making good progress with PT goals at this time. She was able to increase ambulation distance with reduced instances of LOB, as well as complete challenges such as lateral stepping, tandem stance, and standing exercises. The pt continues to present with deficits in activity tolerance, stability, and functional strength in her right extremities. She will continue to benefit from skilled PT acutely as well as on OP basis following d/c from the hospital to facilitate return to prior level of function and independence.     Follow Up Recommendations  Outpatient PT (neuro OP PT)     Equipment Recommendations  None recommended by PT    Recommendations for Other Services       Precautions / Restrictions Precautions Precautions: Fall Restrictions Weight Bearing Restrictions: No    Mobility  Bed Mobility Overal bed mobility: Needs Assistance Bed Mobility: Supine to Sit;Sit to Supine     Supine to sit: Supervision Sit to supine: Supervision   General bed mobility comments: Supervision for safety.   Transfers Overall transfer level: Needs assistance Equipment used: None Transfers: Sit to/from Stand Sit to Stand: Supervision         General transfer comment: for safety   Ambulation/Gait Ambulation/Gait assistance: Min guard Gait Distance (Feet): 150 Feet Assistive device: None Gait Pattern/deviations: Step-through pattern Gait velocity: decreased Gait velocity interpretation: <1.31 ft/sec, indicative of household ambulator General Gait Details: intermittent HHA due to minor LOB to R with gait.    Modified Rankin (Stroke Patients Only) Modified Rankin (Stroke Patients Only) Pre-Morbid  Rankin Score: No symptoms Modified Rankin: Moderately severe disability     Balance Overall balance assessment: Needs assistance Sitting-balance support: No upper extremity supported;Feet supported Sitting balance-Leahy Scale: Good     Standing balance support: No upper extremity supported;During functional activity Standing balance-Leahy Scale: Fair Standing balance comment: multiple minor LOB to R, no assist needed to recover     Tandem Stance - Right Leg: 3 Tandem Stance - Left Leg: 1     High level balance activites: Side stepping High Level Balance Comments: lateral stepping with BUE on hand rail. cues for toe positioning            Cognition Arousal/Alertness: Awake/alert Behavior During Therapy: WFL for tasks assessed/performed Overall Cognitive Status: Within Functional Limits for tasks assessed                                        Exercises General Exercises - Lower Extremity Heel Raises: Strengthening;Both;10 reps;Standing Mini-Sqauts: Strengthening;Both;10 reps;Standing        Pertinent Vitals/Pain Pain Assessment: No/denies pain Faces Pain Scale: (P) No hurt           PT Goals (current goals can now be found in the care plan section) Acute Rehab PT Goals Patient Stated Goal: to go home PT Goal Formulation: With patient Time For Goal Achievement: 02/10/20 Potential to Achieve Goals: Good Additional Goals Additional Goal #1: Pt will score >19 on DGI to indicate low fall risk Progress towards PT goals: Progressing toward goals    Frequency    Min 4X/week  PT Plan Current plan remains appropriate       AM-PAC PT "6 Clicks" Mobility   Outcome Measure  Help needed turning from your back to your side while in a flat bed without using bedrails?: None Help needed moving from lying on your back to sitting on the side of a flat bed without using bedrails?: None Help needed moving to and from a bed to a chair (including a  wheelchair)?: A Little Help needed standing up from a chair using your arms (e.g., wheelchair or bedside chair)?: A Little Help needed to walk in hospital room?: A Little Help needed climbing 3-5 steps with a railing? : A Little 6 Click Score: 20    End of Session Equipment Utilized During Treatment: Gait belt Activity Tolerance: Patient tolerated treatment well Patient left: in bed;with call bell/phone within reach;with family/visitor present Nurse Communication: Mobility status PT Visit Diagnosis: Unsteadiness on feet (R26.81);Muscle weakness (generalized) (M62.81)     Time: 2542-7062 PT Time Calculation (min) (ACUTE ONLY): 20 min  Charges:  $Gait Training: 8-22 mins                     Rolm Baptise, PT, DPT   Acute Rehabilitation Department Pager #: [phone]   Gaetana Michaelis 01/28/2020, 1:28 PM

## 2020-01-28 NOTE — Evaluation (Signed)
Occupational Therapy Evaluation Patient Details Name: Maria Porter MRN: [id number] DOB: 1976/03/09 Today's Date: 01/28/2020    History of Present Illness Pt is a 44 y/o female admitted secondary to R UE and LE weakness. Found to have L frontal lobe infarct.    Clinical Impression   PTA patient independent and working. Admitted for above and limited by problem list below, including weakness and decreased coordination of R UE (decreased shoulder ROM), impaired activity tolerance.  Patient currently requires min assist to supervision for ADLs due to RUE, supervision for in room transfers and mobility.  She was educated on increasing functional use of R UE, exercises to RUE, activity progression and fatigue mgmt.  Patient reports plan to dc home to her daughters home in [place], who can provide increased support.  Believe she will benefit from further OT services while admitted and after dc at OP OT level to optimize independence and return to PLOF. Will follow.     Follow Up Recommendations  Outpatient OT;Supervision - Intermittent    Equipment Recommendations  None recommended by OT    Recommendations for Other Services       Precautions / Restrictions Precautions Precautions: Fall Restrictions Weight Bearing Restrictions: No      Mobility Bed Mobility Overal bed mobility: Needs Assistance Bed Mobility: Supine to Sit;Sit to Supine     Supine to sit: Supervision Sit to supine: Supervision   General bed mobility comments: Supervision for safety.   Transfers Overall transfer level: Needs assistance Equipment used: None Transfers: Sit to/from Stand Sit to Stand: Supervision         General transfer comment: for safety     Balance Overall balance assessment: Needs assistance Sitting-balance support: No upper extremity supported;Feet supported Sitting balance-Leahy Scale: Good     Standing balance support: No upper extremity supported;During functional  activity Standing balance-Leahy Scale: Fair                             ADL either performed or assessed with clinical judgement   ADL Overall ADL's : Needs assistance/impaired     Grooming: Supervision/safety;Standing;Wash/dry hands;Wash/dry face;Oral care   Upper Body Bathing: Sitting;Minimal assistance   Lower Body Bathing: Supervison/ safety;Set up;Sit to/from stand   Upper Body Dressing : Minimal assistance;Sitting   Lower Body Dressing: Sit to/from stand;Minimal assistance   Toilet Transfer: Supervision/safety;Ambulation   Toileting- Clothing Manipulation and Hygiene: Supervision/safety;Sit to/from stand       Functional mobility during ADLs: Supervision/safety General ADL Comments: pt limited by R shoulder functional use, decreased activity tolerance      Vision   Vision Assessment?: No apparent visual deficits     Perception     Praxis      Pertinent Vitals/Pain Pain Assessment: No/denies pain     Hand Dominance Left   Extremity/Trunk Assessment Upper Extremity Assessment Upper Extremity Assessment: RUE deficits/detail;LUE deficits/detail RUE Deficits / Details: limited shoulder flexion AROM to 45 degrees, PROM WFL but mild tone; distal WFL but grossly weak 3/5 and decreased coordination  RUE Sensation: decreased light touch;decreased proprioception RUE Coordination: decreased fine motor;decreased gross motor LUE Deficits / Details: hx of prior CVA with Stereognosis difficulty    Lower Extremity Assessment Lower Extremity Assessment: Defer to PT evaluation   Cervical / Trunk Assessment Cervical / Trunk Assessment: Normal   Communication Communication Communication: No difficulties   Cognition Arousal/Alertness: Awake/alert Behavior During Therapy: WFL for tasks assessed/performed Overall Cognitive Status: Within Functional Limits  for tasks assessed                                     General Comments  educated on  fatigue mgmt and energy conservation, exercises to R UE: SROM to R shoulder (not pushing ROM >90*), elbow flexion/extension and hand flexion/extension     Exercises     Shoulder Instructions      Home Living Family/patient expects to be discharged to:: Private residence Living Arrangements: Other (Comment) (sister ) Available Help at Discharge: Family;Available 24 hours/day Type of Home: House Home Access: Stairs to enter [employer] of Steps: 3 Entrance Stairs-Rails: Left Home Layout: One level     Bathroom Shower/Tub: Tub/shower unit ("walk in tub" )   Bathroom Toilet: Standard     Home Equipment: None   Additional Comments: plans to dc with her daughter in [place] listed above       Prior Functioning/Environment Level of Independence: Independent        Comments: LPN (night shift), driving         OT Problem List: Decreased strength;Decreased activity tolerance;Impaired balance (sitting and/or standing);Impaired UE functional use;Impaired sensation;Decreased knowledge of precautions;Decreased knowledge of use of DME or AE;Decreased safety awareness;Decreased coordination      OT Treatment/Interventions: Self-care/ADL training;DME and/or AE instruction;Energy conservation;Therapeutic activities;Patient/family education;Balance training;Neuromuscular education    OT Goals(Current goals can be found in the care plan section) Acute Rehab OT Goals Patient Stated Goal: to go home OT Goal Formulation: With patient Time For Goal Achievement: 02/11/20 Potential to Achieve Goals: Good  OT Frequency: Min 2X/week   Barriers to D/C:            Co-evaluation              AM-PAC OT "6 Clicks" Daily Activity     Outcome Measure Help from another person eating meals?: A Little Help from another person taking care of personal grooming?: A Little Help from another person toileting, which includes using toliet, bedpan, or urinal?: A Little Help from  another person bathing (including washing, rinsing, drying)?: A Little Help from another person to put on and taking off regular upper body clothing?: A Little Help from another person to put on and taking off regular lower body clothing?: A Little 6 Click Score: 18   End of Session Nurse Communication: Mobility status  Activity Tolerance: Patient tolerated treatment well Patient left: in bed;with call bell/phone within reach  OT Visit Diagnosis: Other abnormalities of gait and mobility (R26.89);Muscle weakness (generalized) (M62.81);Other symptoms and signs involving the nervous system (R29.898)                Time: 4656-8127 OT Time Calculation (min): 26 min Charges:  OT General Charges $OT Visit: 1 Visit OT Evaluation $OT Eval Moderate Complexity: 1 Mod OT Treatments $Self Care/Home Management : 8-22 mins  Barry Brunner, OT Acute Rehabilitation Services Pager [phone] Office [phone]   Chancy Milroy 01/28/2020, 8:51 AM

## 2020-01-29 ENCOUNTER — Inpatient Hospital Stay (HOSPITAL_COMMUNITY): Payer: Medicaid Other | Admitting: Certified Registered Nurse Anesthetist

## 2020-01-29 ENCOUNTER — Encounter (HOSPITAL_COMMUNITY)
Admission: EM | Disposition: A | Discharge status: Home / Self Care | Payer: Self-pay | Source: Home / Self Care | Attending: Student in an Organized Health Care Education/Training Program

## 2020-01-29 ENCOUNTER — Encounter (HOSPITAL_COMMUNITY): Payer: Self-pay | Admitting: Student in an Organized Health Care Education/Training Program

## 2020-01-29 ENCOUNTER — Inpatient Hospital Stay (HOSPITAL_COMMUNITY): Payer: Medicaid Other

## 2020-01-29 DIAGNOSIS — I639 Cerebral infarction, unspecified: Secondary | ICD-10-CM

## 2020-01-29 DIAGNOSIS — G4733 Obstructive sleep apnea (adult) (pediatric): Secondary | ICD-10-CM

## 2020-01-29 HISTORY — PX: TEE WITHOUT CARDIOVERSION: SHX5443

## 2020-01-29 HISTORY — PX: BUBBLE STUDY: SHX6837

## 2020-01-29 LAB — GLUCOSE, CSF: Glucose, CSF: 72 mg/dL — ABNORMAL HIGH (ref 40–70)

## 2020-01-29 LAB — MPO/PR-3 (ANCA) ANTIBODIES
ANCA Proteinase 3: <3.5 U/mL (ref 0.0–3.5)
Myeloperoxidase Abs: <9 U/mL (ref 0.0–9.0)

## 2020-01-29 LAB — BASIC METABOLIC PANEL
Anion gap: 9 (ref 5–15)
BUN: 8 mg/dL (ref 6–20)
CO2: 21 mmol/L — ABNORMAL LOW (ref 22–32)
Calcium: 9.4 mg/dL (ref 8.9–10.3)
Chloride: 109 mmol/L (ref 98–111)
Creatinine, Ser: 0.93 mg/dL (ref 0.44–1.00)
GFR calc Af Amer: >60 mL/min (ref >60)
GFR calc non Af Amer: >60 mL/min (ref >60)
Glucose, Bld: 114 mg/dL — ABNORMAL HIGH (ref 70–99)
Potassium: 4 mmol/L (ref 3.5–5.1)
Sodium: 139 mmol/L (ref 135–145)

## 2020-01-29 LAB — GRAM STAIN: Gram Stain: NONE SEEN

## 2020-01-29 LAB — CSF CELL COUNT WITH DIFFERENTIAL
RBC Count, CSF: 10 /mm3 — ABNORMAL HIGH
Tube #: 3
WBC, CSF: 2 /mm3 (ref 0–5)

## 2020-01-29 LAB — CRYPTOCOCCAL ANTIGEN, CSF: Crypto Ag: NEGATIVE

## 2020-01-29 LAB — CBC
HCT: 42.4 % (ref 36.0–46.0)
Hemoglobin: 14.3 g/dL (ref 12.0–15.0)
MCH: 29.9 pg (ref 26.0–34.0)
MCHC: 33.7 g/dL (ref 30.0–36.0)
MCV: 88.5 fL (ref 80.0–100.0)
Platelets: 332 10*3/uL (ref 150–400)
RBC: 4.79 MIL/uL (ref 3.87–5.11)
RDW: 12.2 % (ref 11.5–15.5)
WBC: 6.8 10*3/uL (ref 4.0–10.5)
nRBC: 0 % (ref 0.0–0.2)

## 2020-01-29 LAB — PROTEIN, CSF: Total  Protein, CSF: 44 mg/dL (ref 15–45)

## 2020-01-29 SURGERY — ECHOCARDIOGRAM, TRANSESOPHAGEAL
Anesthesia: Monitor Anesthesia Care

## 2020-01-29 MED ORDER — PHENYLEPHRINE HCL-NACL 10-0.9 MG/250ML-% IV SOLN
INTRAVENOUS | Status: AC
  Filled 2020-01-29: qty 1000

## 2020-01-29 MED ORDER — PROPOFOL 10 MG/ML IV BOLUS
INTRAVENOUS | Status: DC | PRN
  Administered 2020-01-29: 50 mg via INTRAVENOUS
  Administered 2020-01-29 (×4): 20 mg via INTRAVENOUS

## 2020-01-29 MED ORDER — CLOPIDOGREL BISULFATE 75 MG PO TABS
75.0000 mg | ORAL_TABLET | Freq: Every day | ORAL | Status: DC
  Administered 2020-01-30: 75 mg via ORAL
  Filled 2020-01-29: qty 1

## 2020-01-29 MED ORDER — LIDOCAINE VISCOUS HCL 2 % MT SOLN
OROMUCOSAL | Status: AC
  Filled 2020-01-29: qty 60

## 2020-01-29 MED ORDER — LIDOCAINE HCL (CARDIAC) PF 100 MG/5ML IV SOSY
PREFILLED_SYRINGE | INTRAVENOUS | Status: DC | PRN
  Administered 2020-01-29: 40 mg via INTRAVENOUS

## 2020-01-29 MED ORDER — ATORVASTATIN CALCIUM 40 MG PO TABS: 80.0000 mg | ORAL_TABLET | Freq: Every day | ORAL | 0 refills | Status: DC

## 2020-01-29 MED ORDER — LIDOCAINE 2% (20 MG/ML) 5 ML SYRINGE
INTRAMUSCULAR | Status: AC
  Filled 2020-01-29: qty 20

## 2020-01-29 MED ORDER — ATORVASTATIN CALCIUM 40 MG PO TABS: 40.0000 mg | ORAL_TABLET | Freq: Every day | ORAL | 0 refills | Status: DC

## 2020-01-29 MED ORDER — PROPOFOL 1000 MG/100ML IV EMUL
INTRAVENOUS | Status: AC
  Filled 2020-01-29: qty 400

## 2020-01-29 MED ORDER — ENOXAPARIN SODIUM 40 MG/0.4ML ~~LOC~~ SOLN
40.0000 mg | SUBCUTANEOUS | Status: DC
  Administered 2020-01-29: 40 mg via SUBCUTANEOUS
  Filled 2020-01-29: qty 0.4

## 2020-01-29 MED ORDER — PROPOFOL 500 MG/50ML IV EMUL
INTRAVENOUS | Status: DC | PRN
  Administered 2020-01-29: 120 ug/kg/min via INTRAVENOUS

## 2020-01-29 MED ORDER — ASPIRIN EC 81 MG PO TBEC: 81.0000 mg | DELAYED_RELEASE_TABLET | Freq: Every day | ORAL | 0 refills | Status: DC

## 2020-01-29 MED ORDER — LIDOCAINE HCL (PF) 1 % IJ SOLN
5.0000 mL | Freq: Once | INTRAMUSCULAR | Status: AC
  Administered 2020-01-29: 5 mL

## 2020-01-29 MED FILL — ATORVASTATIN CALCIUM 40 MG: 40 | 30 days supply | Qty: 30 | Fill #0

## 2020-01-29 MED FILL — ASPIRIN LOW DOSE 81 MG TBEC: 81 | 21 days supply | Qty: 21 | Fill #0

## 2020-01-29 NOTE — TOC Progression Note (Addendum)
Transition of Care [facility]) - Progression Note    Patient Details  Name: Maria Porter MRN: [id number] Date of Birth: [birth year]  Transition of [facility]) CM/SW Contact  Lockie Pares, RN Phone Number: 01/29/2020, 11:44 AM  Clinical Narrative:    Discussed outpatient therapy with patient. Patient is going to stay with daughter in [place]. Does want to continue with MD's here, but would be more convenient to do therapy in [place].  PLEASE HAVE PAPER SCRIPTS FOR PT OT FOR PATIENT AT DISCHARGE. MATCH completed for medications  MD aware to send medications to [facility] pharmacy, and to write scripts for PT OT.    Expected Discharge Plan: Home/Self Care Barriers to Discharge: Continued Medical Work up  Expected Discharge Plan and Services Expected Discharge Plan: Home/Self Care   Discharge Planning Services: CM Consult   Living arrangements for the past 2 months: Apartment                                       Social Determinants of Health (SDOH) Interventions    Readmission Risk Interventions No flowsheet data found.

## 2020-01-29 NOTE — Progress Notes (Signed)
OT Cancellation Note  Patient Details Name: Fontella Shan MRN: [id number] DOB: Dec 16, 1975   Cancelled Treatment:    Reason Eval/Treat Not Completed: Patient at procedure or test/ unavailable. Pt at radiology for LP per RN. Will follow and see as able.   Barry Brunner, OT Acute Rehabilitation Services Pager [phone] Office [phone]   Chancy Milroy 01/29/2020, 10:15 AM

## 2020-01-29 NOTE — Progress Notes (Addendum)
Spoke with neurology concerning this patient. After result of OSA evaluation yesterday patient will need to stay overnight for CPAP evaluation. Patient will remain in hospital overnight.

## 2020-01-29 NOTE — Progress Notes (Addendum)
HD#3 Subjective:  Overnight Events: none    Patient evaluated at bedside this morning. Patient just returned TEE. State she is feeling well with no new concerns. She is planning to live with daughter in [place], [place] with plan to travel back to [facility] for continuity of care and will follow up with neurology and interna medicine center. Plan for discharge discussed paitient is understands and agrees with plan.  Objective:  Vital signs in last 24 hours: Vitals:   01/28/20 0753 01/28/20 2014 01/29/20 0030 01/29/20 0422  BP: 119/79 116/86 101/84 118/90  Pulse: 83 82 82 76  Resp: 18 18 18 18   Temp: 98.7 F (37.1 C) 98.1 F (36.7 C) 98.2 F (36.8 C) 97.8 F (36.6 C)  TempSrc: Oral Oral Oral Oral  SpO2: 95% 100% 100% 98%  Weight:      Height:       Supplemental O2: Room Air SpO2: 98 %   Physical Exam:  Physical Exam Constitutional:      Appearance: Normal appearance.  HENT:     Head: Normocephalic and atraumatic.  Eyes:     Extraocular Movements: Extraocular movements intact.     Pupils: Pupils are equal, round, and reactive to light.  Neurological:     Mental Status: She is alert and oriented to person, place, and time.     Filed Weights   01/27/20 1425  Weight: 93.4 kg     Intake/Output Summary (Last 24 hours) at 01/29/2020 0546 Last data filed at 01/28/2020 2315 Gross per 24 hour  Intake 240 ml  Output --  Net 240 ml   Net IO Since Admission: 720 mL [01/29/20 0546]  Pertinent Labs: CBC Latest Ref Rng & Units 01/28/2020 01/27/2020 01/26/2020  WBC 4.0 - 10.5 K/uL 6.5 8.1 -  Hemoglobin 12.0 - 15.0 g/dL 01/28/2020 85.8 15.6(H)  Hematocrit 36 - 46 % 42.4 42.5 46.0  Platelets 150 - 400 K/uL 344 318 -    CMP Latest Ref Rng & Units 01/28/2020 01/27/2020 01/26/2020  Glucose 70 - 99 mg/dL 01/28/2020) 277(A) 128(N)  BUN 6 - 20 mg/dL 10 12 11   Creatinine 0.44 - 1.00 mg/dL 867(E ) 7.20  Sodium 135 - 145 mmol/L 137 138 139  Potassium 3.5 - 5.1 mmol/L 3.9 3.7 4.4   Chloride 98 - 111 mmol/L 105 104 107  CO2 22 - 32 mmol/L 23 23 -  Calcium 8.9 - 10.3 mg/dL 9.3 9.3 -  Total Protein 6.5 - 8.1 g/dL - 7.0 -  Total Bilirubin 0.3 - 1.2 mg/dL - 0.3 -  Alkaline Phos 38 - 126 U/L - 89 -  AST 15 - 41 U/L - 20 -  ALT 0 - 44 U/L - 18 -   HIV RPR negative anticardoilipin IGM of 14 otherwise nefative Antithrombin ii, protin s prontien c Lupus anticoagulant, AnA, ANCA, beta 2 glycoprotien Vitamin B-12 180 - 914 pg/mL 332     Imaging: No results found.  Assessment/Plan:   Principal Problem:   Acute CVA (cerebrovascular accident) [facility]) Active Problems:   Tobacco use disorder   Hyperlipemia   Obesity  Patient Summary: [name] is a 44 year old female with PMHx of prior stroke and tobacco use disorder admitted for left frontal lobe ischemic infarct.  Acute left frontal lobe ischemic infarct: Presented with acute onset right sided weakness and aphasia. MRI with new ischemic left frontal cortical and subcortical stroke. Her neurologic exam continues to improved with  improvement in sensation to light touch  in right V2 and V3 segments and RUE strength. Risk factors include prior stroke and tobacco use disorder. Hypercoagulable workup negative except mild elevation of anticardiolipin IgM of 14. Will need follow up on TEE and LP performed today. Patient states TEE showed PFO will need to follow up with neurology to discuss possible future PFO closure.  Will discharge on aspirin 81mg  daily, plavix 75mg  daily x 21 days, and atorvastatin 80mg  daily. Pt plants to move to live with daughter in [place], will give outpatient PT, OT and SPL referrals. She will continue to follow up with Neurology and establish in IMTS clinic in the short term.  Tobacco use disorder: Discussed recommendation for smoking cessation for which patient is currently in contemplative stage. Plan to have her establish the IMTS and will continue to encourage her to stop  smoking.  Diet: HH Fluid/Electrolytes: Monitor and replete prn Code status: FULL  Prior to Admission Living Arrangement:Home Anticipated Discharge Location:Home Barriers to Discharge:None Dispo: Anticipated discharge home  today.  , MD 01/29/2020, 5:46 AM Pager: [phone]  Please contact the on call pager after 5 pm and on weekends at [phone].

## 2020-01-29 NOTE — Anesthesia Postprocedure Evaluation (Signed)
Anesthesia Post Note  Patient: Maria Porter  Procedure(s) Performed: TRANSESOPHAGEAL ECHOCARDIOGRAM (TEE) (N/A ) BUBBLE STUDY     Patient location during evaluation: PACU Anesthesia Type: MAC Level of consciousness: awake and alert Pain management: pain level controlled Vital Signs Assessment: post-procedure vital signs reviewed and stable Respiratory status: spontaneous breathing, nonlabored ventilation, respiratory function stable and patient connected to nasal cannula oxygen Cardiovascular status: stable and blood pressure returned to baseline Postop Assessment: no apparent nausea or vomiting Anesthetic complications: no   No complications documented.  Last Vitals:  Vitals:   01/29/20 0850 01/29/20 0854  BP: 102/85 113/69  Pulse: 80 76  Resp: 14 16  Temp:    SpO2: 97% 98%    Last Pain:  Vitals:   01/29/20 0854  TempSrc:   PainSc: 0-No pain                 March Rummage Ponce Skillman

## 2020-01-29 NOTE — Transfer of Care (Signed)
Immediate Anesthesia Transfer of Care Note  Patient: Danyle Boening  Procedure(s) Performed: TRANSESOPHAGEAL ECHOCARDIOGRAM (TEE) (N/A ) BUBBLE STUDY  Patient Location: Endoscopy Unit  Anesthesia Type:MAC  Level of Consciousness: drowsy  Airway & Oxygen Therapy: Patient Spontanous Breathing and Patient connected to face mask oxygen  Post-op Assessment: Report given to RN and Post -op Vital signs reviewed and stable  Post vital signs: Reviewed and stable  Last Vitals:  Vitals Value Taken Time  BP 96/60   Temp    Pulse 71 01/29/20 0837  Resp 27 01/29/20 0837  SpO2 94 % 01/29/20 0837  Vitals shown include unvalidated device data.  Last Pain:  Vitals:   01/29/20 0755  TempSrc:   PainSc: 0-No pain         Complications: No complications documented.

## 2020-01-29 NOTE — CV Procedure (Signed)
     TRANSESOPHAGEAL ECHOCARDIOGRAM   NAME:  Maria Porter   MRN: [id number] DOB:  May 28, 1975   ADMIT DATE: 01/26/2020  INDICATIONS: CVA  PROCEDURE:   Informed consent was obtained prior to the procedure. The risks, benefits and alternatives for the procedure were discussed and the patient comprehended these risks.  Risks include, but are not limited to, cough, sore throat, vomiting, nausea, somnolence, esophageal and stomach trauma or perforation, bleeding, low blood pressure, aspiration, pneumonia, infection, trauma to the teeth and death.    After a procedural time-out, the oropharynx was anesthetized and the patient was sedated by the anesthesia service. The transesophageal probe was inserted in the esophagus and stomach without difficulty and multiple views were obtained. Anesthesia was monitored by Crista Elliot, CRNA.    COMPLICATIONS:    There were no immediate complications.  FINDINGS:  Positive bubble study consistent with PFO.   Epifanio Lesches MD [facility]  [address] [place], [place] [address] [phone]   8:43 AM

## 2020-01-29 NOTE — Anesthesia Preprocedure Evaluation (Addendum)
Anesthesia Evaluation  Patient identified by MRN, date of birth, ID band Patient awake    Reviewed: Patient's Chart, lab work & pertinent test results  Airway Mallampati: II  TM Distance: >3 FB Neck ROM: Full    Dental  (+) Loose, Teeth Intact,    Pulmonary neg pulmonary ROS, Current Smoker and Patient abstained from smoking.,    Pulmonary exam normal        Cardiovascular negative cardio ROS   Rhythm:Regular Rate:Normal     Neuro/Psych 2cm acute left frontal lobe infarction now with resolution of RUE weakness and facial droop CVA, No Residual Symptoms negative psych ROS   GI/Hepatic negative GI ROS, Neg liver ROS,   Endo/Other  negative endocrine ROS  Renal/GU negative Renal ROS  negative genitourinary   Musculoskeletal negative musculoskeletal ROS (+)   Abdominal Normal abdominal exam  (+)  Abdomen: soft. Bowel sounds: normal.  Peds negative pediatric ROS (+)  Hematology negative hematology ROS (+)   Anesthesia Other Findings   Reproductive/Obstetrics negative OB ROS                            Anesthesia Physical Anesthesia Plan  ASA: III  Anesthesia Plan: MAC   Post-op Pain Management:    Induction:   PONV Risk Score and Plan: Ondansetron and Dexamethasone  Airway Management Planned: Simple Face Mask and Nasal Cannula  Additional Equipment: None  Intra-op Plan:   Post-operative Plan:   Informed Consent: I have reviewed the patients History and Physical, chart, labs and discussed the procedure including the risks, benefits and alternatives for the proposed anesthesia with the patient or authorized representative who has indicated his/her understanding and acceptance.     Dental advisory given  Plan Discussed with: CRNA and Surgeon  Anesthesia Plan Comments: (ECHO 01/27/20: Left ventricular ejection fraction, by estimation, is 55 to 60%. The left ventricle has  normal function. The left ventricle has no regional wall motion abnormalities. Left ventricular diastolic parameters were normal. 2. Right ventricular systolic function is normal. The right ventricular size is normal. Tricuspid regurgitation signal is inadequate for assessing PA pressure. 3. The mitral valve is normal in structure. Trivial mitral valve regurgitation. No evidence of mitral stenosis. 4. The aortic valve is grossly normal. Aortic valve regurgitation is not visualized. No aortic stenosis is present. Lab Results      Component                Value               Date                      WBC                      6.5                 01/28/2020                HGB                      14.4                01/28/2020                HCT                      42.4  01/28/2020                MCV                      88.3                01/28/2020                PLT                      344                 01/28/2020           Covid-19 Nucleic Acid Test Results Lab Results      Component                Value               Date                      [place]              NEGATIVE            01/26/2020           5. The inferior vena cava is normal in size with greater than 50% respiratory variability, suggesting right atrial pressure of 3 mmHg.)        Anesthesia Quick Evaluation

## 2020-01-29 NOTE — Procedures (Signed)
Technically successful fluoroscopically-guided L4-L5 lumbar puncture.  No immediate post-procedure complication.   9 mL CSF obtained for laboratory studies.

## 2020-01-29 NOTE — Progress Notes (Addendum)
STROKE TEAM PROGRESS NOTE   INTERVAL HISTORY Daughter at bedside.  Patient had TEE and fluoroscopy guided LP this morning.  CSF clean, no evidence of inflammation or infection.  TEE showed a PFO, however formal report pending.  Patient screened positive for OSA overnight, it showed to Sleep Smart trial.  Vitals:   01/29/20 0837 01/29/20 0840 01/29/20 0850 01/29/20 0854  BP: 96/60 96/60 102/85 113/69  Pulse: 71 68 80 76  Resp: (!) 27 (!) $Remo'26 14 16  'GSgxH$ Temp: 97.6 F (36.4 C)     TempSrc: Oral     SpO2: 94% 95% 97% 98%  Weight:      Height:       CBC:  Recent Labs  Lab 01/26/20 1404 01/26/20 1409 01/27/20 0500 01/28/20 0523  WBC 6.1   < > 8.1 6.5  NEUTROABS 3.2  --   --   --   HGB 14.9   < > 13.8 14.4  HCT 46.2*   < > 42.5 42.4  MCV 90.1   < > 89.7 88.3  PLT 326   < > 318 344   < > = values in this interval not displayed.   Basic Metabolic Panel:  Recent Labs  Lab 01/27/20 0500 01/28/20 0523  NA 138 137  K 3.7 3.9  CL 104 105  CO2 23 23  GLUCOSE 120* 107*  BUN 12 10  CREATININE 1.03* 0.90  CALCIUM 9.3 9.3   Lipid Panel:  Recent Labs  Lab 01/27/20 0500  CHOL 181  TRIG 69  HDL 42  CHOLHDL 4.3  VLDL 14  LDLCALC 125*   HgbA1c:  Recent Labs  Lab 01/27/20 1011  HGBA1C 5.8*   Urine Drug Screen: No results for input(s): LABOPIA, COCAINSCRNUR, LABBENZ, AMPHETMU, THCU, LABBARB in the last 168 hours.  Alcohol Level No results for input(s): ETH in the last 168 hours.  IMAGING past 24 hours DG FL GUIDED LUMBAR PUNCTURE  Result Date: 01/29/2020 CLINICAL DATA:  Cryptogenic stroke. EXAM: DIAGNOSTIC LUMBAR PUNCTURE UNDER FLUOROSCOPIC GUIDANCE FLUOROSCOPY TIME:  Fluoroscopy Time:  36 seconds Radiation Exposure Index (if provided by the fluoroscopic device): 13.80 mGy Number of Acquired Spot Images: None PROCEDURE: Informed consent was obtained from the patient prior to the procedure, including potential complications of headache, allergy, and pain. With the patient prone,  the lower back was prepped with Betadine. 1% Lidocaine was used for local anesthesia. Lumbar puncture was performed at the L4-L5 level using a 20 gauge needle with return of clear CSF. 9 ml of CSF were obtained for laboratory studies. The patient tolerated the procedure well and there were no apparent complications. IMPRESSION: Technically successful fluoroscopically-guided L4-L5 lumbar puncture. No immediate post-procedure complication. 9 mL CSF obtained for laboratory studies. Electronically Signed   By: Kellie Simmering DO   On: 01/29/2020 10:49    PHYSICAL EXAM   Temp:  [97.6 F (36.4 C)-98.2 F (36.8 C)] 97.6 F (36.4 C) (09/24 0837) Pulse Rate:  [68-86] 76 (09/24 0854) Resp:  [14-27] 16 (09/24 0854) BP: (96-120)/(60-94) 113/69 (09/24 0854) SpO2:  [94 %-100 %] 98 % (09/24 0854)  General - Well nourished, well developed, in no apparent distress.  Ophthalmologic - fundi not visualized due to noncooperation.  Cardiovascular - Regular rhythm and rate.  Mental Status -  Level of arousal and orientation to time, place, and person were intact. Language including expression, naming, repetition, comprehension was assessed and found intact. Attention span and concentration were normal. Fund of Knowledge was assessed and was  intact.  Cranial Nerves II - XII - II - Visual field intact OU. III, IV, VI - Extraocular movements intact. V - Facial sensation intact bilaterally. VII - Facial movement intact bilaterally. VIII - Hearing & vestibular intact bilaterally. X - Palate elevates symmetrically. XI - Chin turning & shoulder shrug intact bilaterally. XII - Tongue protrusion intact.  Motor Strength - The patients strength was normal in all extremities and pronator drift was absent.  Bulk was normal and fasciculations were absent.   Motor Tone - Muscle tone was assessed at the neck and appendages and was normal.  Reflexes - The patients reflexes were symmetrical in all extremities and she  had no pathological reflexes.  Sensory - Light touch, temperature/pinprick were assessed and were symmetrical.    Coordination - The patient had normal movements in the hands and feet with no ataxia or dysmetria.  Tremor was absent.  Gait and Station - deferred.   ASSESSMENT/PLAN [name] is a 44 y.o. female with history of L frontal stroke presenting with aphasia and R sided weakness that progressed from acute R face and arm numbness with inability to speak.   Stroke:  L frontal cortical infarct in setting of previous left frontal infarct, felt to be embolic without source identified. Workup underway  Code Stroke CT head subacute L frontal lobe infarct ASPECTS 9    CTA head & neck Unremarkable   CT perfusion Unremarkable   MRI  L frontal lobe cortical and subcortical infarct. Multiple old B frontal lobe and inferior L cerebellar infarcts  MRA head Unremarkable   MRA neck Unremarkable   EEG slow, no seizure  2D Echo EF 55-60%. No source of embolus   TCD w/ bubble few HITS after valsalva->trivial clinically insignificant   LE doppler no DVT  TEE shows small PFO   CSF WBC 3, RBC 10, glucose 72, protein 44.  No evidence of infection or inflammation.  LDL 125  HgbA1c 5.8  Hypercoagulable labs neg except anticardiolipin IgM 14  ESR and CRP neg, B12 332  VTE prophylaxis - Lovenox 40 mg sq daily   aspirin 81 mg daily prior to admission, now on aspirin 81 mg and plavix daily. Continue DAPT x 3 weeks then plavix alone  Therapy recommendations:  OP PT, OP OT, OP SLP  Disposition:  Plans to live w/ daughter in [place] following d/c  ? Small PFO  Continue bubble study insignificant  TEE shows small PFO   RoPE score 6 (62% change stroke d/t PFO)   Refer to Dr. Burt Knack to review TEE and decide on further action  Hyperlipidemia  Home meds:  No statin  Now on lipitor 40  LDL 125, goal < 70  Continue statin at discharge  Tobacco abuse  Current  smoker  Smoking cessation counseling provided  Nicotine patch   Pt is willing to quit   Other Stroke Risk Factors  Hx stroke/TIA - prior B frontal lobe infarct and left cerebellar infarcts on imaging this admission (details not available in Epic).    Obesity, BMI 35.34, recommend weight loss, health diet and regular exercise  Snores.  Screening for OSA positive. Enrolled on Sleep Smart trial  Other Active Problems  TSH 20.243, free T4 normal. Previously on synthroid. On hold as subclinical.   Hospital day # 3  Neurology will sign off. Please call with questions. Pt will follow up with stroke clinic Dr. Leonie Man at [facility] in about 4 weeks. Thanks for the consult.   Lillianne Eick  Erlinda Hong, MD PhD Stroke Neurology 01/29/2020 11:53 AM  To contact Stroke Continuity provider, please refer to [email or web address] After hours, contact General Neurology

## 2020-01-29 NOTE — Interval H&P Note (Signed)
History and Physical Interval Note:  01/29/2020 7:54 AM  Maria Porter  has presented today for surgery, with the diagnosis of stroke.  The various methods of treatment have been discussed with the patient and family. After consideration of risks, benefits and other options for treatment, the patient has consented to  Procedure(s): TRANSESOPHAGEAL ECHOCARDIOGRAM (TEE) (N/A) as a surgical intervention.  The patient's history has been reviewed, patient examined, no change in status, stable for surgery.  I have reviewed the patient's chart and labs.  Questions were answered to the patient's satisfaction.     Little Ishikawa

## 2020-01-29 NOTE — Progress Notes (Signed)
Physical Therapy Treatment Patient Details Name: Maria Porter MRN: [id number] DOB: Oct 09, 1975 Today's Date: 01/29/2020    History of Present Illness Pt is a 44 y/o female admitted secondary to R UE and LE weakness. Found to have L frontal lobe infarct.     PT Comments    The pt is continuing to make good progress towards PT goals. She was able to increase distance walked as well as complete higher-level balance challenges with less reliance on UE support or assist. The pt continues to be challenged by dynamic stability, activity tolerance, and endurance, and continues to be a good candidate for continued OP neuro therapies. She works full-time with physically demanding job, and will need to tolerate multiple hours of activity at a time and improve stability so that she is able to physically lift and assist others without LOB. The pt is safe to return home with assist of family, but will need continued therapies to return to her prior level of function.     Follow Up Recommendations  Outpatient PT (neuro OP PT)     Equipment Recommendations  None recommended by PT    Recommendations for Other Services       Precautions / Restrictions Precautions Precautions: Fall Restrictions Weight Bearing Restrictions: No    Mobility  Bed Mobility Overal bed mobility: Modified Independent             General bed mobility comments: use of bed rail, no assist needed from flat bed  Transfers Overall transfer level: Needs assistance Equipment used: None Transfers: Sit to/from Stand Sit to Stand: Supervision         General transfer comment: for safety, good use of UE for support without being cued  Ambulation/Gait Ambulation/Gait assistance: Min guard Gait Distance (Feet): 200 Feet Assistive device: None Gait Pattern/deviations: Step-through pattern;Wide base of support Gait velocity: decreased Gait velocity interpretation: <1.31 ft/sec, indicative of household  ambulator General Gait Details: HHA for higher-level balance challenges only, slowed but steady gait with no LOB. significant pronation with each step, discussed supportive shoes for improved stability and comfort.    Modified Rankin (Stroke Patients Only) Modified Rankin (Stroke Patients Only) Pre-Morbid Rankin Score: No symptoms Modified Rankin: Moderately severe disability     Balance Overall balance assessment: Needs assistance Sitting-balance support: No upper extremity supported;Feet supported Sitting balance-Leahy Scale: Good     Standing balance support: No upper extremity supported;During functional activity Standing balance-Leahy Scale: Fair Standing balance comment: multiple minor LOB to R, no assist needed to recover             High level balance activites: Side stepping;Backward walking High Level Balance Comments: tandem walking 2 x 15 ft, backwards walking 2 x 15 ft. lateral stepping with single UE support, x20 ft each direction with cues for toe positioning            Cognition Arousal/Alertness: Awake/alert Behavior During Therapy: WFL for tasks assessed/performed Overall Cognitive Status: Within Functional Limits for tasks assessed                                               Pertinent Vitals/Pain Pain Assessment: No/denies pain           PT Goals (current goals can now be found in the care plan section) Acute Rehab PT Goals Patient Stated Goal: to go home PT Goal Formulation:  With patient Time For Goal Achievement: 02/10/20 Potential to Achieve Goals: Good Additional Goals Additional Goal #1: Pt will score >19 on DGI to indicate low fall risk Progress towards PT goals: Progressing toward goals    Frequency    Min 4X/week      PT Plan Current plan remains appropriate       AM-PAC PT "6 Clicks" Mobility   Outcome Measure  Help needed turning from your back to your side while in a flat bed without using  bedrails?: None Help needed moving from lying on your back to sitting on the side of a flat bed without using bedrails?: None Help needed moving to and from a bed to a chair (including a wheelchair)?: A Little Help needed standing up from a chair using your arms (e.g., wheelchair or bedside chair)?: A Little   Help needed climbing 3-5 steps with a railing? : A Little 6 Click Score: 17    End of Session Equipment Utilized During Treatment: Gait belt Activity Tolerance: Patient tolerated treatment well Patient left: in bed;with call bell/phone within reach;with family/visitor present Nurse Communication: Mobility status PT Visit Diagnosis: Unsteadiness on feet (R26.81);Muscle weakness (generalized) (M62.81)     Time: 1914-7829 PT Time Calculation (min) (ACUTE ONLY): 19 min  Charges:  $Gait Training: 8-22 mins                     Maria Porter, PT, DPT   Acute Rehabilitation Department Pager #: [phone]   Maria Porter 01/29/2020, 4:18 PM

## 2020-01-29 NOTE — TOC CAGE-AID Note (Signed)
Transition of Care [facility]) - CAGE-AID Screening   Patient Details  Name: Maria Porter MRN: [id number] Date of Birth: Jun 16, 1975  [facility]) CM/SW Contact:    Emeterio Reeve, [place] Phone Number: 01/29/2020, 3:36 PM   Clinical Narrative:  CSW met with pt at bedside. CSW introduced self and explained her role at the hospital.  PT denies alcohol use and substance use. Pt did not need any resources at this time.    CAGE-AID Screening:    Have You Ever Felt You Ought to Cut Down on Your Drinking or Drug Use?: No Have People Annoyed You By Critizing Your Drinking Or Drug Use?: No Have You Felt Bad Or Guilty About Your Drinking Or Drug Use?: No Have You Ever Had a Drink or Used Drugs First Thing In The Morning to Steady Your Nerves or to Get Rid of a Hangover?: No CAGE-AID Score: 0  Substance Abuse Education Offered: Yes     Blima Ledger, [place] Social Worker [phone]

## 2020-01-30 MED ORDER — STROKE: EARLY STAGES OF RECOVERY BOOK
Freq: Once | Status: DC
  Filled 2020-01-30 (×2): qty 1

## 2020-01-30 MED ORDER — CLOPIDOGREL BISULFATE 75 MG PO TABS: 75.0000 mg | ORAL_TABLET | Freq: Every day | ORAL | 0 refills | Status: AC

## 2020-01-30 MED ORDER — ASPIRIN EC 81 MG PO TBEC: 81.0000 mg | DELAYED_RELEASE_TABLET | Freq: Every day | ORAL | 0 refills | Status: DC

## 2020-01-30 NOTE — Progress Notes (Signed)
Physical Therapy Treatment Patient Details Name: Maria Porter MRN: [id number] DOB: October 21, 1975 Today's Date: 01/30/2020    History of Present Illness Pt is a 44 y/o female admitted secondary to R UE and LE weakness. Found to have L frontal lobe infarct.     PT Comments    The pt is continuing to progress with therapy goals at this time, and was able to complete final session prior to anticipated d/c today with focus on stair training. The pt was able to complete good hallway ambulation with improved speed and stability but continues to benefit from HHA of 1 for stability without AD, and is able to walk at a supervision level with use of a SPC. The pt was also able to complete stair training with HHA of 1 but no use of rail with good safety and stability, but does fatigue after 3 steps. The pt was also educated on energy conservation, a home walking program, exercises for LE strengthening, use of cane, and stair navigation. The pt has no further questions at this time, and is safe to d/c home with her daughter, but will benefit from continued OP PT to progress endurance and dynamic stability.    Follow Up Recommendations  Outpatient PT (neuro OP PT)     Equipment Recommendations  Cane    Recommendations for Other Services       Precautions / Restrictions Precautions Precautions: Fall Restrictions Weight Bearing Restrictions: No    Mobility  Bed Mobility Overal bed mobility: Modified Independent             General bed mobility comments: pt sitting EOB upon arrival of PT  Transfers Overall transfer level: Needs assistance Equipment used: None;Straight cane Transfers: Sit to/from Stand Sit to Stand: Supervision         General transfer comment: supervision both with and without cane, good management of cane with transitions  Ambulation/Gait Ambulation/Gait assistance: Supervision Gait Distance (Feet): 200 Feet (+ 30 ft) Assistive device: None;1 person hand held  assist;Straight cane Gait Pattern/deviations: Step-through pattern;Decreased stride length Gait velocity: 0.57 m/s Gait velocity interpretation: 1.31 - 2.62 ft/sec, indicative of limited community ambulator General Gait Details: pt continues to prefer UE support for stability, no LOB today with gait with or without UE support, but improved gait speed and stability with cane   Stairs Stairs: Yes Stairs assistance: Min guard Stair Management: Forwards Number of Stairs: 4 General stair comments: HHA of 1 given for stairs, no use of rail. HR to 97 bpm, and pt reports fatigue, but no LOB or knee buckling with stairs   Wheelchair Mobility    Modified Rankin (Stroke Patients Only) Modified Rankin (Stroke Patients Only) Pre-Morbid Rankin Score: No symptoms Modified Rankin: Moderately severe disability     Balance Overall balance assessment: Needs assistance Sitting-balance support: No upper extremity supported;Feet supported Sitting balance-Leahy Scale: Good     Standing balance support: During functional activity;Single extremity supported Standing balance-Leahy Scale: Fair Standing balance comment: no LOB, but pt prefers single UE support for dynamic stability                            Cognition Arousal/Alertness: Awake/alert Behavior During Therapy: WFL for tasks assessed/performed Overall Cognitive Status: Within Functional Limits for tasks assessed  General Comments General comments (skin integrity, edema, etc.): educated on use of cane, stair training, and exercises for LE      Pertinent Vitals/Pain Pain Assessment: No/denies pain Faces Pain Scale: No hurt           PT Goals (current goals can now be found in the care plan section) Acute Rehab PT Goals Patient Stated Goal: to go home PT Goal Formulation: With patient Time For Goal Achievement: 02/10/20 Potential to Achieve Goals:  Good Additional Goals Additional Goal #1: Pt will score >19 on DGI to indicate low fall risk Progress towards PT goals: Progressing toward goals    Frequency    Min 4X/week      PT Plan Current plan remains appropriate       AM-PAC PT "6 Clicks" Mobility   Outcome Measure  Help needed turning from your back to your side while in a flat bed without using bedrails?: None Help needed moving from lying on your back to sitting on the side of a flat bed without using bedrails?: None Help needed moving to and from a bed to a chair (including a wheelchair)?: A Little Help needed standing up from a chair using your arms (e.g., wheelchair or bedside chair)?: None Help needed to walk in hospital room?: A Little Help needed climbing 3-5 steps with a railing? : A Little 6 Click Score: 21    End of Session Equipment Utilized During Treatment: Gait belt Activity Tolerance: Patient tolerated treatment well Patient left: in bed;with call bell/phone within reach (sitting EOB) Nurse Communication: Mobility status PT Visit Diagnosis: Unsteadiness on feet (R26.81);Muscle weakness (generalized) (M62.81)     Time: 9798-9211 PT Time Calculation (min) (ACUTE ONLY): 15 min  Charges:  $Gait Training: 8-22 mins                     Rolm Baptise, PT, DPT   Acute Rehabilitation Department Pager #: [phone]   Gaetana Michaelis 01/30/2020, 11:15 AM

## 2020-01-30 NOTE — TOC Transition Note (Signed)
Transition of Care [facility]) - CM/SW Discharge Note   Patient Details  Name: Maria Porter MRN: [id number] Date of Birth: Apr 14, 1976  [facility] of [facility]) CM/SW Contact:  Lockie Pares, RN Phone Number: 01/30/2020, 5:20 PM   Clinical Narrative:     Patient does not qualify for CPAP, needs a formal outpatient sleep study and diagnosis to warrant CPAP, as well this would be out of pocket as patient does not have insurance. At this time.     Barriers to Discharge: Continued Medical Work up   Patient Goals and CMS Choice        Discharge Placement             Home self care with outpatient services.          Discharge Plan and Services   Discharge Planning Services: CM Consult                                 Social Determinants of Health (SDOH) Interventions     Readmission Risk Interventions No flowsheet data found.

## 2020-01-30 NOTE — Progress Notes (Addendum)
HD#4 Subjective:  Overnight Events: None   Patient evaluated at bedside. States she is doing well this morning. Felt that CPAP at night went well. Discussed plan to for discharge today, she understands and is agreeable to plan  Objective:  Vital signs in last 24 hours: Vitals:   01/29/20 1650 01/29/20 2017 01/30/20 0001 01/30/20 0406  BP: (!) 137/96 122/81 (!) 118/96 (!) 119/105  Pulse: 82 64 (!) 102 89  Resp: 16 16 14 14   Temp: 98.7 F (37.1 C) 97.9 F (36.6 C) 98 F (36.7 C) 97.9 F (36.6 C)  TempSrc: Oral Oral Oral Oral  SpO2: 96% 98% 99% 98%  Weight:      Height:       Supplemental O2: Room Air SpO2: 98 % O2 Flow Rate (L/min): 8 L/min   Physical Exam:  Physical Exam Constitutional:      Appearance: Normal appearance.  HENT:     Head: Normocephalic and atraumatic.  Neurological:     Mental Status: She is alert and oriented to person, place, and time.     Comments: 4/5 grip strength on the right 5/5 on the left  No aphsia     Filed Weights   01/27/20 1425  Weight: 93.4 kg     Intake/Output Summary (Last 24 hours) at 01/30/2020 0619 Last data filed at 01/29/2020 0831 Gross per 24 hour  Intake 300 ml  Output --  Net 300 ml   Net IO Since Admission: 1,020 mL [01/30/20 0619]  Pertinent Labs: CBC Latest Ref Rng & Units 01/29/2020 01/28/2020 01/27/2020  WBC 4.0 - 10.5 K/uL 6.8 6.5 8.1  Hemoglobin 12.0 - 15.0 g/dL 01/29/2020 43.1 54.0  Hematocrit 36 - 46 % 42.4 42.4 42.5  Platelets 150 - 400 K/uL 332 344 318    CMP Latest Ref Rng & Units 01/29/2020 01/28/2020 01/27/2020  Glucose 70 - 99 mg/dL 01/29/2020) 761(P) 509(T)  BUN 6 - 20 mg/dL 8 10 12   Creatinine 0.44 - 1.00 mg/dL 267(T 2.45)  Sodium 135 - 145 mmol/L 139 137 138  Potassium 3.5 - 5.1 mmol/L 4.0 3.9 3.7  Chloride 98 - 111 mmol/L 109 105 104  CO2 22 - 32 mmol/L 21(L) 23 23  Calcium 8.9 - 10.3 mg/dL 9.4 9.3 9.3  Total Protein 6.5 - 8.1 g/dL - - 7.0  Total Bilirubin 0.3 - 1.2 mg/dL - - 0.3  Alkaline  Phos 38 - 126 U/L - - 89  AST 15 - 41 U/L - - 20  ALT 0 - 44 U/L - - 18    Imaging: ECHO TEE  Result Date: 01/29/2020    TRANSESOPHOGEAL ECHO REPORT   Patient Name:   Maria Porter Date of Exam: 01/29/2020 Medical Rec #:  Maria Porter       Height:       64.0 in Accession #:    01/31/2020      Weight:       205.9 lb Date of Birth:  1975-09-20       BSA:          1.981 m Patient Age:    44 years        BP:           113/69 mmHg Patient Gender: F               HR:           76 bpm. Exam Location:  Inpatient Procedure: Transesophageal Echo, Cardiac Doppler and Color Doppler  Indications:     CVA  History:         Patient has prior history of Echocardiogram examinations, most                  recent 01/27/2020.  Sonographer:     Margreta Journey Referring Phys:  [id number] Corrin Parker Diagnosing Phys: Epifanio Lesches MD PROCEDURE: After discussion of the risks and benefits of a TEE, an informed consent was obtained from the patient. The transesophogeal probe was passed without difficulty through the esophogus of the patient. Imaged were obtained with the patient in a left lateral decubitus position. Local oropharyngeal anesthetic was provided with Cetacaine. Sedation performed by different physician. The patient was monitored while under deep sedation. Anesthestetic sedation was provided intravenously by Anesthesiology: 313mg  of Propofol. Image quality was adequate. The patient developed no complications during the procedure. IMPRESSIONS  1. Left ventricular ejection fraction, by estimation, is 55 to 60%. The left ventricle has normal function.  2. Right ventricular systolic function is normal. The right ventricular size is normal.  3. No left atrial/left atrial appendage thrombus was detected.  4. The mitral valve is normal in structure. Trivial mitral valve regurgitation.  5. The aortic valve is tricuspid. Aortic valve regurgitation is not visualized.  6. Agitated saline contrast bubble study was positive  with shunting observed within 3-6 cardiac cycles suggestive of interatrial shunt. Conclusion(s)/Recommendation(s): Positive bubble study consistent with PFO. FINDINGS  Left Ventricle: Left ventricular ejection fraction, by estimation, is 55 to 60%. The left ventricle has normal function. The left ventricular internal cavity size was normal in size. Right Ventricle: The right ventricular size is normal. Right vetricular wall thickness was not assessed. Right ventricular systolic function is normal. Left Atrium: Left atrial size was normal in size. No left atrial/left atrial appendage thrombus was detected. Right Atrium: Right atrial size was normal in size. Pericardium: There is no evidence of pericardial effusion. Mitral Valve: The mitral valve is normal in structure. Trivial mitral valve regurgitation. Tricuspid Valve: The tricuspid valve is normal in structure. Tricuspid valve regurgitation is not demonstrated. Aortic Valve: The aortic valve is tricuspid. Aortic valve regurgitation is not visualized. Pulmonic Valve: The pulmonic valve was grossly normal. Pulmonic valve regurgitation is not visualized. Aorta: The aortic root is normal in size and structure. IAS/Shunts: No atrial level shunt detected by color flow Doppler. Agitated saline contrast was given intravenously to evaluate for intracardiac shunting. Agitated saline contrast bubble study was positive with shunting observed within 3-6 cardiac cycles suggestive of interatrial shunt. MD Electronically signed by Epifanio Lesches MD Signature Date/Time: 01/29/2020/4:06:09 PM    Final    DG FL GUIDED LUMBAR PUNCTURE  Result Date: 01/29/2020 CLINICAL DATA:  Cryptogenic stroke. EXAM: DIAGNOSTIC LUMBAR PUNCTURE UNDER FLUOROSCOPIC GUIDANCE FLUOROSCOPY TIME:  Fluoroscopy Time:  36 seconds Radiation Exposure Index (if provided by the fluoroscopic device): 13.80 mGy Number of Acquired Spot Images: None PROCEDURE: Informed consent was obtained  from the patient prior to the procedure, including potential complications of headache, allergy, and pain. With the patient prone, the lower back was prepped with Betadine. 1% Lidocaine was used for local anesthesia. Lumbar puncture was performed at the L4-L5 level using a 20 gauge needle with return of clear CSF. 9 ml of CSF were obtained for laboratory studies. The patient tolerated the procedure well and there were no apparent complications. IMPRESSION: Technically successful fluoroscopically-guided L4-L5 lumbar puncture. No immediate post-procedure complication. 9 mL CSF obtained for laboratory studies. Electronically Signed  By: Jackey Loge DO   On: 01/29/2020 10:49    Assessment/Plan:   Principal Problem:   Acute CVA (cerebrovascular accident) [facility]) Active Problems:   Tobacco use disorder   Hyperlipemia   Obesity   Patient Summary: Maria Porter is a 44 year old female with PMHx of prior stroke and tobacco use disorder admitted for left frontal lobe ischemic infarct.  Acute left frontal lobe ischemic infarct: Presented with acute onset right sided weakness and aphasia. MRI with new ischemic left frontal cortical and subcortical stroke.He sensation to light touch in right V2 and V3 segments and RUE strength stable. Risk factors include prior stroke and tobacco use disorder. Hypercoagulable workup negative except mild elevation of anticardiolipin IgM of 14.TEE showed PFO will need to follow up with neurology to discuss possible future PFO closure. CSF without signs of infection or inflammation. Patient stayed for CPAP overnight tolerated well. Will discharge on aspirin 81mg  daily,plavix 75mg  daily x 21 days, and atorvastatin 40mg  daily. Pt plants to move to live with daughter in [place], will give outpatient PT, OT and SPL referrals. She will continue to follow up with Neurology and establish in IMTS clinic in the short term.  Tobacco use disorder: Discussed recommendation for  smoking cessation for which patient is currently in contemplative stage. Plan to have her establish the IMTS and will continue to encourage her to stop smoking.  Diet: HH Fluid/Electrolytes: Monitor and replete prn Code status: FULL  Prior to Admission Living Arrangement:Home Anticipated Discharge Location:Home Barriers to Discharge:None Dispo: Anticipated discharge home  today.  , MD 01/30/2020, 6:19 AM Pager: [phone]  Please contact the on call pager after 5 pm and on weekends at [phone].

## 2020-01-30 NOTE — Hospital Course (Signed)
[name] 44 year old female with PMHx of prior L frontal CVA w/ongoing paresthesias in left hand, tobacco use disorder, and prior ovarian tumor s/p resection presenting with acute onset right sided weakness and aphasia admitted for left frontal infarct.   Acute CVA vs TIA:  Patient presents with acute onset right sided weakness and aphasia with right facial droop. Patient does have significant risk factors including prior stroke for which she is only on aspirin and tobacco use disorder. CT Head wo contrast with subacute infarct of left frontal lobe without any further findings. MRI with left frontal lobe cortical and subcortical infarct and multiple bilateral frontal lobe and inferior left cerebellar infarcts.   During admission she had improvement in symptoms overall but continued to have some weakness in right upper and lower extremity on examination and mildly diminished sensation in the right V2 and V3 segments without any other sensory deficits on examination. P- Neurology consulted, appreciate their recommendations - F/u MRI brain with MRA head and neck  - F/u Echo - Lipid panel and HbA1c  - Aspirin 81mg  daily - Permissive HTN in first 24 hours (goal SBP <220, DBP<110) - Cardiac monitoring - PT/OT/SLP evaluation  - F/u EEG    Tobacco use disorder:  Patient endorses 30 pack year smoking history. Discussed increased risk of stroke with smoking. Currently in precontemplative stage. Will need to continue to encourage smoking cessation   Hypertension: Patient not on any antihypertensives at home. Noted to have SBP 140-150sand DBP 90-110 during initial evaluation. Allowed for permissive BP improved without intervention. d - Permissive HTN in first 24 hours given acute CVA - Goal is normotensive on discharge    Nonspecific EKG changes: Mild ST segment elevation and T wave inversions on EKG without prior EKG for comparison. No chest pain and HEART score of 3.Will hold off on getting  troponins.  - Repeat EKG in AM

## 2020-01-30 NOTE — Progress Notes (Signed)
Occupational Therapy Treatment Patient Details Name: Maria Porter MRN: [id number] DOB: May 18, 1975 Today's Date: 01/30/2020    History of present illness Pt is a 44 y/o female admitted secondary to R UE and LE weakness. Found to have L frontal lobe infarct.    OT comments  Pt. Seen for skilled OT treatment session.  Able to complete grooming, toileting, and LB bathing tasks with S.  Provided HEP with focus on R scapula and shoulder ROM.  Pt. Able to return demo.  Pt. Very motivated for cont. 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  She will be staying with her dtr. And sister initially.  Agree with recommendations to continue with outpatient OT/PT once d/c.    Follow Up Recommendations  Outpatient OT;Supervision - Intermittent    Equipment Recommendations  None recommended by OT    Recommendations for Other Services      Precautions / Restrictions Precautions Precautions: Fall Restrictions Weight Bearing Restrictions: No       Mobility Bed Mobility Overal bed mobility: Modified Independent Bed Mobility: Supine to Sit     Supine to sit: Modified independent (Device/Increase time)     General bed mobility comments: sitting eob at end of session per pt. request  Transfers Overall transfer level: Needs assistance Equipment used: None Transfers: Sit to/from [employer] Sit to Stand: Supervision Stand pivot transfers: Supervision       General transfer comment: moving well, asking about need for a cane. reviewed will pass along to PT to get their recommendations.    Balance Overall balance assessment: Needs assistance Sitting-balance support: No upper extremity supported;Feet supported Sitting balance-Leahy Scale: Good     Standing balance support: During functional activity;Single extremity supported Standing balance-Leahy Scale: Fair Standing balance comment: no LOB, but pt prefers single UE support for dynamic stability                            ADL either performed or assessed with clinical judgement   ADL Overall ADL's : Needs assistance/impaired     Grooming: Wash/dry hands;Standing;Supervision/safety       Lower Body Bathing: Supervison/ safety;Set up;Sit to/from stand;Sitting/lateral leans Lower Body Bathing Details (indicate cue type and reason): pt. requested to wash peri areas after toileting task. performed mostly seated/lateral leans     Lower Body Dressing: Set up;Sitting/lateral leans Lower Body Dressing Details (indicate cue type and reason): able to don socks without assistance Toilet Transfer: Supervision/safety;Ambulation   Toileting- Clothing Manipulation and Hygiene: Supervision/safety;Sit to/from stand;Sitting/lateral lean       Functional mobility during ADLs: Supervision/safety General ADL Comments: pt. reports hx. of previous CVA that affected her L side.  and now new issues with R side.  states she remembers the importance of looking at what she is wanting to do and it helps guide the weaker extremity to the area. she used reaching in her pocket as an example. states "i have to look down at my pocket and it helps me put my hand in there to get what i want out".  pt. is L hand dominant so we reivewed how imp. it is to include the RUE into tasks. reviewed several examples with items on her tray and adl items.     Vision       Perception     Praxis      Cognition Arousal/Alertness: Awake/alert Behavior During Therapy: WFL for tasks assessed/performed Overall Cognitive Status: Within Functional  Limits for tasks assessed                                 General Comments: pt. states "[place], im used to talking fast so is my dtr. and sister, but now i know i have to slow down and i think before i speak to get the words out. it also takes me a little longer to answer".  says "you have to laugh and have fun with it" in regards to her sister  teasing her about her new pace        Exercises Other Exercises Other Exercises: provided HEP created by CB (evaluating OTR).  focus on scapular squeezing, and shoulder shrugs, and table slides. pt. able to return demo.   Shoulder Instructions       General Comments Pt. Tearful/emotional at beginning of session. Having difficulty coming to terms with not being able to work right now but states tears are not bad she is thanking God and greatful.  Encouraged her to have her feelings and continue to process them.  She apologized for crying. Explained that is was perfectly fine to cry and told her to work through it all and feel it all.  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      Pertinent Vitals/ Pain       Pain Assessment: Faces Faces Pain Scale: Hurts a little bit Pain Location: B shoulders Pain Descriptors / Indicators: Aching Pain Intervention(s): Limited activity within patient's tolerance;Monitored during session;Repositioned  Home Living                                          Prior Functioning/Environment              Frequency  Min 2X/week        Progress Toward Goals  OT Goals(current goals can now be found in the care plan section)  Progress towards OT goals: Progressing toward goals  Acute Rehab OT Goals Patient Stated Goal: to go home  Plan      Co-evaluation                 AM-PAC OT "6 Clicks" Daily Activity     Outcome Measure   Help from another person eating meals?: A Little Help from another person taking care of personal grooming?: A Little Help from another person toileting, which includes using toliet, bedpan, or urinal?: A Little Help from another person bathing (including washing, rinsing, drying)?: A Little Help from another person to put on and taking off regular upper body clothing?: A Little Help from another person to put on and taking off regular lower body clothing?: A Little 6 Click  Score: 18    End of Session Equipment Utilized During Treatment: Gait belt  OT Visit Diagnosis: Other abnormalities of gait and mobility (R26.89);Muscle weakness (generalized) (M62.81);Other symptoms and signs involving the nervous system (R29.898)   Activity Tolerance Patient tolerated treatment well   Patient Left in bed;with call bell/phone within reach   Nurse Communication          Time: 0277-4128 OT Time Calculation (min): 26 min  Charges: OT General Charges $OT Visit: 1 Visit OT Treatments $Self Care/Home Management : 8-22 mins $Therapeutic Exercise: 8-22 mins  Boneta Lucks, COTA/L  Acute Rehabilitation [phone]   Robet Leu 01/30/2020, 11:37 AM

## 2020-01-31 NOTE — Discharge Summary (Signed)
Name: Maria Porter MRN: [id number] DOB: 1976/04/18 44 y.o. PCP: Patient, No Pcp Per  Date of Admission: 01/26/2020  2:01 PM Date of Discharge: 01/30/2020 Attending Physician: Dr. Sandre Kitty  Discharge Diagnosis: Principal Problem:   Acute CVA (cerebrovascular accident) [facility]) Active Problems:   Tobacco use disorder   Hyperlipemia   Obesity    Discharge Medications: Allergies as of 01/30/2020   No Known Allergies     Medication List    TAKE these medications   albuterol 108 (90 Base) MCG/ACT inhaler Commonly known as: VENTOLIN HFA Inhale 1-2 puffs into the lungs every 6 (six) hours as needed for wheezing or shortness of breath.   aspirin EC 81 MG tablet Take 1 tablet (81 mg total) by mouth daily for 21 days. Swallow whole.   atorvastatin 40 MG tablet Commonly known as: LIPITOR Take 1 tablet (40 mg total) by mouth daily.   Black Elderberry(Berry-Flower) 575 MG Caps Take 575 mg by mouth daily.   clopidogrel 75 MG tablet Commonly known as: PLAVIX Take 1 tablet (75 mg total) by mouth daily.   ibuprofen 200 MG tablet Commonly known as: ADVIL Take 600 mg by mouth every 6 (six) hours as needed for headache or moderate pain.   multivitamin with minerals tablet Take 1 tablet by mouth daily.            Durable Medical Equipment  (From admission, onward)         Start     Ordered   01/30/20 0000  For home use only DME Other see comment       Comments: Patient needs outpatient PT, OT, SLP for neuro PT.  Question:  Length of Need  Answer:  6 Months   01/30/20 1554          Disposition and follow-up:   [name] was discharged from [facility] in Stable condition.  At the hospital follow up visit please address:  1.  Follow-up:  A. CVA - make sure patient is taking asa and plavix for 21 days and then asa alone. She will need risk factor modification such as smoking cessations, HTN and pre-DM control   B. Subclinical Hypothyroid -  will ned TSH and T4 rechecked. Was on Synthroid in the past   C. HLD - continue atorvastatin 40 mg   D. Prediabetes - may benefit form starting metformin and DM counseling    2.  Labs / imaging needed at time of follow-up: CBC, CMP  3.  Pending labs/ test needing follow-up: none    Follow-up Appointments:  Follow-up Information    [facility]. Go on 02/03/2020.   Why: at 9:45AM Contact information: [address] [place] [address] [phone]       PT OT Follow up.   Why: Paper scripts Contact information: Look for a clinic that is close to daughters place in [place], Jason Fila, MD Follow up in 4 week(s).   Specialties: Neurology, Radiology Contact information: [address] [place] [address] [phone]               Hospital Course by problem list: [name] 44 year old female with PMHx of prior L frontal CVA w/ongoing paresthesias in left hand, tobacco use disorder, and prior ovarian tumor s/p resection presenting with acute onset right sided weakness and aphasia admitted for left frontal infarct.   Acute Left Frontal Lobe Thrombotic Infarct:  Presented with acute onset right sided weakness and aphasia. MRI with new ischemic left frontal cortical and subcortical stroke.Herneurologic exam continues to improved withimprovement in sensation to light touch in right V2 and V3 segments and RUE strength. Risk factors include prediabetes, smoking, and previous CVA. She screened positive for OSA and enrolled in the Sleep Smart study through Dr. Pearlean Brownie. Thorough workup was performed with hypercoagulable studies and LP, which were WNL. Patient stated on ASA and plavix for 3 weeks then asa alone. TTE and TEE were performed without presence of thrombus. A small PFO was found but there was no indication to repair the PFO   Tobacco use disorder:  Patient endorses 30 pack year smoking history.  Discussed increased risk of stroke with smoking. Currently in precontemplative stage. Will need to continue to encourage smoking cessation   Prediabetes: The patient would likely benefit from diabetes counseling and starting metformin for her prediabetes (Hgb A1c of 5.8)   HLD: Patient started on atorvastatin 40 mg daily.   Discharge Vitals:   BP 105/83   Pulse (!) 101   Temp 98.1 F (36.7 C) (Oral)   Resp 16   Ht 5\' 4"  (1.626 m)   Wt 93.4 kg   SpO2 99%   BMI 35.34 kg/m   Pertinent Labs, Studies, and Procedures:  CBC Latest Ref Rng & Units 01/29/2020 01/28/2020 01/27/2020  WBC 4.0 - 10.5 K/uL 6.8 6.5 8.1  Hemoglobin 12.0 - 15.0 g/dL 01/29/2020 36.1 44.3  Hematocrit 36 - 46 % 42.4 42.4 42.5  Platelets 150 - 400 K/uL 332 344 318    CMP Latest Ref Rng & Units 01/29/2020 01/28/2020 01/27/2020  Glucose 70 - 99 mg/dL 01/29/2020) 008(Q) 761(P)  BUN 6 - 20 mg/dL 8 10 12   Creatinine 0.44 - 1.00 mg/dL 509(T 2.67)  Sodium 135 - 145 mmol/L 139 137 138  Potassium 3.5 - 5.1 mmol/L 4.0 3.9 3.7  Chloride 98 - 111 mmol/L 109 105 104  CO2 22 - 32 mmol/L 21(L) 23 23  Calcium 8.9 - 10.3 mg/dL 9.4 9.3 9.3  Total Protein 6.5 - 8.1 g/dL - - 7.0  Total Bilirubin 0.3 - 1.2 mg/dL - - 0.3  Alkaline Phos 38 - 126 U/L - - 89  AST 15 - 41 U/L - - 20  ALT 0 - 44 U/L - - 18    EEG  Result Date: 01/26/2020 5.80(D, MD     01/26/2020  5:19 PM [name] MRN: 01/28/2020 Epilepsy Attending: Novella Olive Referring Physician/Provider: Dr [id number] Date: 01/26/2020 Duration: 25.64mins Patient history: 44 y.o. female with PMH significant for prior L frontal stroke who presents with acute onset aphasia + R sided weakness. EEG to evaluate for seizure. Level of alertness: Awake AEDs during EEG study: None Technical aspects: This EEG study was done with scalp electrodes positioned according to the 10-20 International system of electrode placement. Electrical activity was acquired at a  sampling rate of 500Hz  and reviewed with a high frequency filter of 70Hz  and a low frequency filter of 1Hz . EEG data were recorded continuously and digitally stored. Description: The posterior dominant rhythm consists of 8-9 Hz activity of moderate voltage (25-35 uV) seen predominantly in posterior head regions, symmetric and reactive to eye opening and eye closing. EEG showed intermittent 3 to 6 Hz theta-delta slowing in left frontotemporal region.  Hyperventilation and photic stimulation were not performed.   ABNORMALITY -Intermittent slow, left frontotemporal region IMPRESSION: This study is suggestive of  cortical dysfunction in left frontotemporal region likely secondary to underlying stroke. No seizures or epileptiform discharges were seen throughout the recording. Priyanka Annabelle Harman   MR ANGIO HEAD WO CONTRAST  Result Date: 01/26/2020 CLINICAL DATA:  Initial evaluation for neuro deficit, stroke. EXAM: MRI HEAD WITHOUT CONTRAST MRA HEAD WITHOUT CONTRAST MRA NECK WITHOUT CONTRAST TECHNIQUE: Multiplanar, multiecho pulse sequences of the brain and surrounding structures were obtained without intravenous contrast. Angiographic images of the Circle of Willis were obtained using MRA technique without intravenous contrast. Angiographic images of the neck were obtained using MRA technique without intravenous contrast. Carotid stenosis measurements (when applicable) are obtained utilizing NASCET criteria, using the distal internal carotid diameter as the denominator. COMPARISON:  Prior CT and CTA from earlier the same day. FINDINGS: MRI HEAD FINDINGS Brain: Cerebral volume within normal limits for age. Few scattered subcentimeter foci of T2/FLAIR hyperintensity noted within the supratentorial cerebral white matter, nonspecific, but most commonly related to chronic microvascular ischemic disease. Focal area of encephalomalacia and gliosis seen involving the anterior left frontal cortex, consistent with a chronic left  MCA territory infarct. Additional tiny remote cortical infarct noted at the posterior right frontal region (series 15, image 17). Associated mild chronic hemosiderin staining seen about these chronic infarcts. Additional chronic infarct noted at the paramedian inferior left cerebellum. Small linear focus of restricted diffusion involving the cortical and subcortical aspect of the left frontal lobe consistent with a small acute ischemic infarct, left MCA distribution (series 7, image 58). This measures approximately 2.4 cm in length. No associated hemorrhage or mass effect. No other evidence for acute or subacute ischemia. Gray-white matter differentiation otherwise maintained. No other areas of encephalomalacia to suggest chronic cortical infarction. No other evidence for acute or chronic intracranial hemorrhage. No mass lesion, midline shift or mass effect. No hydrocephalus or extra-axial fluid collection. Pituitary gland suprasellar region within normal limits. Midline structures intact. Vascular: Major intracranial vascular flow voids are maintained. Skull and upper cervical spine: Craniocervical junction within normal limits. Signal intensity within the visualized bone marrow somewhat diffusely decreased on T1 weighted imaging, nonspecific, but most commonly related to anemia, smoking, or obesity. No focal marrow replacing lesion. No scalp soft tissue abnormality. Sinuses/Orbits: Globes and orbital soft tissues within normal limits. Paranasal sinuses are clear. No significant mastoid effusion. Other: None. MRA HEAD FINDINGS ANTERIOR CIRCULATION: Visualized distal cervical segments of the internal carotid arteries are widely patent with symmetric antegrade flow. Petrous, cavernous, and supraclinoid segments widely patent without stenosis or other abnormality. A1 segments patent bilaterally. Normal anterior communicating artery complex. Anterior cerebral arteries widely patent to their distal aspects without  stenosis. No M1 stenosis or occlusion. Normal MCA bifurcations. Distal MCA branches well perfused and symmetric. POSTERIOR CIRCULATION: Both vertebral arteries patent to the vertebrobasilar junction without stenosis. Right vertebral artery dominant. Patent left PICA. Right PICA not seen. Basilar patent to its distal aspect without stenosis. Superior cerebral arteries patent bilaterally, with the left SCA arising from the left P1/P2 junction. Right PCA supplied via the basilar. Fetal type origin of the left PCA supplied via a robust left posterior communicating artery. Both PCAs well perfused to their distal aspects without stenosis. No intracranial aneurysm. MRA NECK FINDINGS AORTIC ARCH: Examination technically limited by lack of IV contrast. Visualized aortic arch within normal limits for caliber. Bovine branching pattern with common origin of the left common and innominate artery noted. No flow-limiting stenosis about the origin of the great vessels. RIGHT CAROTID SYSTEM: Right common and internal carotid arteries patent  without stenosis or occlusion. No significant atheromatous narrowing about the right bifurcation. LEFT CAROTID SYSTEM: Left common and internal carotid arteries patent without stenosis or occlusion. No significant atheromatous narrowing about the left bifurcation. VERTEBRAL ARTERIES: Both vertebral arteries arise from the subclavian arteries. Right vertebral artery dominant. Vertebral arteries patent without stenosis or occlusion. IMPRESSION: MRI HEAD IMPRESSION: 1. 2.4 cm linear acute ischemic infarct involving the cortical and subcortical left frontal lobe as above. No associated hemorrhage or mass effect. 2. Multiple old/chronic infarctions involving the bilateral frontal lobes and inferior left cerebellum. MRA HEAD IMPRESSION: Normal intracranial MRA. No large vessel occlusion, hemodynamically significant stenosis, or other vascular abnormality. No aneurysm. MRA NECK IMPRESSION: Normal MRA of  the neck. Electronically Signed   By: Rise Mu M.D.   On: 01/26/2020 20:28   MR ANGIO NECK WO CONTRAST  Result Date: 01/26/2020 CLINICAL DATA:  Initial evaluation for neuro deficit, stroke. EXAM: MRI HEAD WITHOUT CONTRAST MRA HEAD WITHOUT CONTRAST MRA NECK WITHOUT CONTRAST TECHNIQUE: Multiplanar, multiecho pulse sequences of the brain and surrounding structures were obtained without intravenous contrast. Angiographic images of the Circle of Willis were obtained using MRA technique without intravenous contrast. Angiographic images of the neck were obtained using MRA technique without intravenous contrast. Carotid stenosis measurements (when applicable) are obtained utilizing NASCET criteria, using the distal internal carotid diameter as the denominator. COMPARISON:  Prior CT and CTA from earlier the same day. FINDINGS: MRI HEAD FINDINGS Brain: Cerebral volume within normal limits for age. Few scattered subcentimeter foci of T2/FLAIR hyperintensity noted within the supratentorial cerebral white matter, nonspecific, but most commonly related to chronic microvascular ischemic disease. Focal area of encephalomalacia and gliosis seen involving the anterior left frontal cortex, consistent with a chronic left MCA territory infarct. Additional tiny remote cortical infarct noted at the posterior right frontal region (series 15, image 17). Associated mild chronic hemosiderin staining seen about these chronic infarcts. Additional chronic infarct noted at the paramedian inferior left cerebellum. Small linear focus of restricted diffusion involving the cortical and subcortical aspect of the left frontal lobe consistent with a small acute ischemic infarct, left MCA distribution (series 7, image 58). This measures approximately 2.4 cm in length. No associated hemorrhage or mass effect. No other evidence for acute or subacute ischemia. Gray-white matter differentiation otherwise maintained. No other areas of  encephalomalacia to suggest chronic cortical infarction. No other evidence for acute or chronic intracranial hemorrhage. No mass lesion, midline shift or mass effect. No hydrocephalus or extra-axial fluid collection. Pituitary gland suprasellar region within normal limits. Midline structures intact. Vascular: Major intracranial vascular flow voids are maintained. Skull and upper cervical spine: Craniocervical junction within normal limits. Signal intensity within the visualized bone marrow somewhat diffusely decreased on T1 weighted imaging, nonspecific, but most commonly related to anemia, smoking, or obesity. No focal marrow replacing lesion. No scalp soft tissue abnormality. Sinuses/Orbits: Globes and orbital soft tissues within normal limits. Paranasal sinuses are clear. No significant mastoid effusion. Other: None. MRA HEAD FINDINGS ANTERIOR CIRCULATION: Visualized distal cervical segments of the internal carotid arteries are widely patent with symmetric antegrade flow. Petrous, cavernous, and supraclinoid segments widely patent without stenosis or other abnormality. A1 segments patent bilaterally. Normal anterior communicating artery complex. Anterior cerebral arteries widely patent to their distal aspects without stenosis. No M1 stenosis or occlusion. Normal MCA bifurcations. Distal MCA branches well perfused and symmetric. POSTERIOR CIRCULATION: Both vertebral arteries patent to the vertebrobasilar junction without stenosis. Right vertebral artery dominant. Patent left PICA. Right PICA not seen.  Basilar patent to its distal aspect without stenosis. Superior cerebral arteries patent bilaterally, with the left SCA arising from the left P1/P2 junction. Right PCA supplied via the basilar. Fetal type origin of the left PCA supplied via a robust left posterior communicating artery. Both PCAs well perfused to their distal aspects without stenosis. No intracranial aneurysm. MRA NECK FINDINGS AORTIC ARCH: Examination  technically limited by lack of IV contrast. Visualized aortic arch within normal limits for caliber. Bovine branching pattern with common origin of the left common and innominate artery noted. No flow-limiting stenosis about the origin of the great vessels. RIGHT CAROTID SYSTEM: Right common and internal carotid arteries patent without stenosis or occlusion. No significant atheromatous narrowing about the right bifurcation. LEFT CAROTID SYSTEM: Left common and internal carotid arteries patent without stenosis or occlusion. No significant atheromatous narrowing about the left bifurcation. VERTEBRAL ARTERIES: Both vertebral arteries arise from the subclavian arteries. Right vertebral artery dominant. Vertebral arteries patent without stenosis or occlusion. IMPRESSION: MRI HEAD IMPRESSION: 1. 2.4 cm linear acute ischemic infarct involving the cortical and subcortical left frontal lobe as above. No associated hemorrhage or mass effect. 2. Multiple old/chronic infarctions involving the bilateral frontal lobes and inferior left cerebellum. MRA HEAD IMPRESSION: Normal intracranial MRA. No large vessel occlusion, hemodynamically significant stenosis, or other vascular abnormality. No aneurysm. MRA NECK IMPRESSION: Normal MRA of the neck. Electronically Signed   By: Rise MuBenjamin  McClintock M.D.   On: 01/26/2020 20:28   MR BRAIN WO CONTRAST  Result Date: 01/26/2020 CLINICAL DATA:  Initial evaluation for neuro deficit, stroke. EXAM: MRI HEAD WITHOUT CONTRAST MRA HEAD WITHOUT CONTRAST MRA NECK WITHOUT CONTRAST TECHNIQUE: Multiplanar, multiecho pulse sequences of the brain and surrounding structures were obtained without intravenous contrast. Angiographic images of the Circle of Willis were obtained using MRA technique without intravenous contrast. Angiographic images of the neck were obtained using MRA technique without intravenous contrast. Carotid stenosis measurements (when applicable) are obtained utilizing NASCET  criteria, using the distal internal carotid diameter as the denominator. COMPARISON:  Prior CT and CTA from earlier the same day. FINDINGS: MRI HEAD FINDINGS Brain: Cerebral volume within normal limits for age. Few scattered subcentimeter foci of T2/FLAIR hyperintensity noted within the supratentorial cerebral white matter, nonspecific, but most commonly related to chronic microvascular ischemic disease. Focal area of encephalomalacia and gliosis seen involving the anterior left frontal cortex, consistent with a chronic left MCA territory infarct. Additional tiny remote cortical infarct noted at the posterior right frontal region (series 15, image 17). Associated mild chronic hemosiderin staining seen about these chronic infarcts. Additional chronic infarct noted at the paramedian inferior left cerebellum. Small linear focus of restricted diffusion involving the cortical and subcortical aspect of the left frontal lobe consistent with a small acute ischemic infarct, left MCA distribution (series 7, image 58). This measures approximately 2.4 cm in length. No associated hemorrhage or mass effect. No other evidence for acute or subacute ischemia. Gray-white matter differentiation otherwise maintained. No other areas of encephalomalacia to suggest chronic cortical infarction. No other evidence for acute or chronic intracranial hemorrhage. No mass lesion, midline shift or mass effect. No hydrocephalus or extra-axial fluid collection. Pituitary gland suprasellar region within normal limits. Midline structures intact. Vascular: Major intracranial vascular flow voids are maintained. Skull and upper cervical spine: Craniocervical junction within normal limits. Signal intensity within the visualized bone marrow somewhat diffusely decreased on T1 weighted imaging, nonspecific, but most commonly related to anemia, smoking, or obesity. No focal marrow replacing lesion. No scalp soft tissue  abnormality. Sinuses/Orbits: Globes and  orbital soft tissues within normal limits. Paranasal sinuses are clear. No significant mastoid effusion. Other: None. MRA HEAD FINDINGS ANTERIOR CIRCULATION: Visualized distal cervical segments of the internal carotid arteries are widely patent with symmetric antegrade flow. Petrous, cavernous, and supraclinoid segments widely patent without stenosis or other abnormality. A1 segments patent bilaterally. Normal anterior communicating artery complex. Anterior cerebral arteries widely patent to their distal aspects without stenosis. No M1 stenosis or occlusion. Normal MCA bifurcations. Distal MCA branches well perfused and symmetric. POSTERIOR CIRCULATION: Both vertebral arteries patent to the vertebrobasilar junction without stenosis. Right vertebral artery dominant. Patent left PICA. Right PICA not seen. Basilar patent to its distal aspect without stenosis. Superior cerebral arteries patent bilaterally, with the left SCA arising from the left P1/P2 junction. Right PCA supplied via the basilar. Fetal type origin of the left PCA supplied via a robust left posterior communicating artery. Both PCAs well perfused to their distal aspects without stenosis. No intracranial aneurysm. MRA NECK FINDINGS AORTIC ARCH: Examination technically limited by lack of IV contrast. Visualized aortic arch within normal limits for caliber. Bovine branching pattern with common origin of the left common and innominate artery noted. No flow-limiting stenosis about the origin of the great vessels. RIGHT CAROTID SYSTEM: Right common and internal carotid arteries patent without stenosis or occlusion. No significant atheromatous narrowing about the right bifurcation. LEFT CAROTID SYSTEM: Left common and internal carotid arteries patent without stenosis or occlusion. No significant atheromatous narrowing about the left bifurcation. VERTEBRAL ARTERIES: Both vertebral arteries arise from the subclavian arteries. Right vertebral artery dominant.  Vertebral arteries patent without stenosis or occlusion. IMPRESSION: MRI HEAD IMPRESSION: 1. 2.4 cm linear acute ischemic infarct involving the cortical and subcortical left frontal lobe as above. No associated hemorrhage or mass effect. 2. Multiple old/chronic infarctions involving the bilateral frontal lobes and inferior left cerebellum. MRA HEAD IMPRESSION: Normal intracranial MRA. No large vessel occlusion, hemodynamically significant stenosis, or other vascular abnormality. No aneurysm. MRA NECK IMPRESSION: Normal MRA of the neck. Electronically Signed   By: Rise Mu M.D.   On: 01/26/2020 20:28   CT CEREBRAL PERFUSION W CONTRAST  Result Date: 01/26/2020 CLINICAL DATA:  Neuro deficit, acute, stroke suspected. EXAM: CT ANGIOGRAPHY HEAD AND NECK CT PERFUSION BRAIN TECHNIQUE: Multidetector CT imaging of the head and neck was performed using the standard protocol during bolus administration of intravenous contrast. Multiplanar CT image reconstructions and MIPs were obtained to evaluate the vascular anatomy. Carotid stenosis measurements (when applicable) are obtained utilizing NASCET criteria, using the distal internal carotid diameter as the denominator. Multiphase CT imaging of the brain was performed following IV bolus contrast injection. Subsequent parametric perfusion maps were calculated using RAPID software. CONTRAST:  OMNIPAQUE IOHEXOL 350 MG/ML SOLN COMPARISON:  CT head without contrast 01/26/2020 FINDINGS: CTA NECK FINDINGS Aortic arch: Common origin of the left common carotid artery and innominate artery is noted. No significant stenosis or vascular disease present the arch or great vessel origins. Right carotid system: Right common carotid artery is within normal limits. Bifurcation is unremarkable. Cervical right ICA is within normal limits. Left carotid system: Of the left common carotid artery is within normal limits. Bifurcation is unremarkable. Cervical left ICA is normal.  Vertebral arteries: The right vertebral artery is the dominant vessel. Both vertebral arteries originate from the subclavians without significant stenosis. No significant stenosis is present in either vertebral artery in the neck. Skeleton: There is some straightening of the normal cervical lordosis. No significant stenosis is  present in either vertebral artery in the neck. Other neck: Soft tissues the neck are otherwise unremarkable. No significant adenopathy is present. Thyroid is normal. Salivary glands are within normal limits. Upper chest: Mild the lung apices are clear. Thoracic inlet is within normal limits. Review of the MIP images confirms the above findings CTA HEAD FINDINGS Anterior circulation: The internal carotid arteries are within normal limits from the high cervical segments through the ICA termini scratched at the internal carotid arteries are within normal limits from the skull base through the ICA termini. The A1 and M1 segments are normal. The anterior communicating artery is patent. The MCA bifurcations are intact. The ACA and MCA branch vessels are within normal limits. Posterior circulation: Right vertebral artery is the dominant left Sol. PICA origins are visualized and normal. Basilar artery is within normal limits. The right posterior cerebral artery arises from the basilar tip. The left posterior cerebral artery is of fetal type. PCA branch vessels are within normal limits bilaterally. Venous sinuses: The dural sinuses are patent. The straight sinus and deep cerebral veins are intact. Cortical veins are unremarkable. Anatomic variants: Fetal type left posterior cerebral artery. Review of the MIP images confirms the above findings CT Brain Perfusion Findings: ASPECTS: 9/10 CBF (<30%) Volume: 0mL Perfusion (Tmax>6.0s) volume: 0mL Mismatch Volume: 0mL Infarction Location:N/A IMPRESSION: 1. Normal variant CTA Circle of Willis without significant proximal stenosis, aneurysm, or branch vessel  occlusion. 2. Normal CTA of the neck. 3. CT perfusion is normal. Electronically Signed   By: Marin Roberts M.D.   On: 01/26/2020 14:54   VAS [place] TRANSCRANIAL DOPPLER W BUBBLES  Result Date: 01/28/2020  Transcranial Doppler with Bubble Indications: Stroke. History: Left frontal cortical infarct in setting of previous left frontal infarct, felt to be embolic without source identified. Comparison Study: No prior study Performing Technologist: Gertie Fey MHA, RDMS, RVT, RDCS  Examination Guidelines: A complete evaluation includes B-mode imaging, spectral Doppler, color Doppler, and power Doppler as needed of all accessible portions of each vessel. Bilateral testing is considered an integral part of a complete examination. Limited examinations for reoccurring indications may be performed as noted.  Summary:  A vascular evaluation was performed. The right middle cerebral artery was studied. An IV was inserted into the patient's left forearm. Verbal informed consent was obtained.  Few HITS heard after Valsalva release, suggestive of trivial, clinically insignificant right to left intracardiac shunt. Positive TCD Bubble study indicative of a trivial clinically insignificant right to left shunt *See table(s) above for TCD measurements and observations.  Diagnosing physician: Delia Heady MD Electronically signed by Delia Heady MD on 01/28/2020 at 8:20:58 AM.    Final    ECHOCARDIOGRAM COMPLETE  Result Date: 01/27/2020    ECHOCARDIOGRAM REPORT   Patient Name:   SHANTEA POULTON Date of Exam: 01/27/2020 Medical Rec #:  [id number]       Height: Accession #:    [id number]      Weight: Date of Birth:  June 22, 1975       BSA: Patient Age:    44 years        BP:           123/97 mmHg Patient Gender: F               HR:           79 bpm. Exam Location:  Inpatient Procedure: 2D Echo, Cardiac Doppler, Color Doppler and Intracardiac            Opacification  Agent Indications:    CVA  History:        Patient has no prior  history of Echocardiogram examinations.                 Stroke, Arrythmias:Abnormal EKG; Risk Factors:Current Smoker and                 Hypertension.  Sonographer:    Lavenia Atlas Referring Phys: [id number] Marquita Palms VINCENT IMPRESSIONS  1. Left ventricular ejection fraction, by estimation, is 55 to 60%. The left ventricle has normal function. The left ventricle has no regional wall motion abnormalities. Left ventricular diastolic parameters were normal.  2. Right ventricular systolic function is normal. The right ventricular size is normal. Tricuspid regurgitation signal is inadequate for assessing PA pressure.  3. The mitral valve is normal in structure. Trivial mitral valve regurgitation. No evidence of mitral stenosis.  4. The aortic valve is grossly normal. Aortic valve regurgitation is not visualized. No aortic stenosis is present.  5. The inferior vena cava is normal in size with greater than 50% respiratory variability, suggesting right atrial pressure of 3 mmHg. Comparison(s): No prior Echocardiogram. Conclusion(s)/Recommendation(s): Normal biventricular function without evidence of hemodynamically significant valvular heart disease. FINDINGS  Left Ventricle: Left ventricular ejection fraction, by estimation, is 55 to 60%. The left ventricle has normal function. The left ventricle has no regional wall motion abnormalities. Definity contrast agent was given IV to delineate the left ventricular  endocardial borders. The left ventricular internal cavity size was normal in size. There is borderline left ventricular hypertrophy. Left ventricular diastolic parameters were normal. Right Ventricle: The right ventricular size is normal. No increase in right ventricular wall thickness. Right ventricular systolic function is normal. Tricuspid regurgitation signal is inadequate for assessing PA pressure. Left Atrium: Left atrial size was normal in size. Right Atrium: Right atrial size was normal in size.  Pericardium: There is no evidence of pericardial effusion. Mitral Valve: The mitral valve is normal in structure. Trivial mitral valve regurgitation. No evidence of mitral valve stenosis. Tricuspid Valve: The tricuspid valve is normal in structure. Tricuspid valve regurgitation is trivial. No evidence of tricuspid stenosis. Aortic Valve: The aortic valve is grossly normal. Aortic valve regurgitation is not visualized. No aortic stenosis is present. Pulmonic Valve: The pulmonic valve was not well visualized. Pulmonic valve regurgitation is not visualized. Aorta: The aortic root, ascending aorta and aortic arch are all structurally normal, with no evidence of dilitation or obstruction. Venous: The inferior vena cava is normal in size with greater than 50% respiratory variability, suggesting right atrial pressure of 3 mmHg. IAS/Shunts: No atrial level shunt detected by color flow Doppler.  LEFT VENTRICLE PLAX 2D LVIDd:         3.40 cm  Diastology LVIDs:         2.40 cm  LV e' medial:    10.30 cm/s LV PW:         1.20 cm  LV E/e' medial:  6.4 LV IVS:        1.20 cm  LV e' lateral:   7.83 cm/s LVOT diam:     2.00 cm  LV E/e' lateral: 8.4 LV SV:         63 LVOT Area:     3.14 cm  RIGHT VENTRICLE RV Basal diam:  2.90 cm RV S prime:     11.70 cm/s TAPSE (M-mode): 2.6 cm LEFT ATRIUM             RIGHT  ATRIUM LA diam:        2.50 cm RA Area:     10.00 cm LA Vol (A2C):   27.9 ml RA Volume:   20.00 ml LA Vol (A4C):   16.3 ml LA Biplane Vol: 23.1 ml  AORTIC VALVE LVOT Vmax:   111.00 cm/s LVOT Vmean:  74.500 cm/s LVOT VTI:    0.202 m  AORTA Ao Root diam: 2.90 cm MITRAL VALVE MV Area (PHT): 3.03 cm    SHUNTS MV Decel Time: 250 msec    Systemic VTI:  0.20 m MV E velocity: 66.00 cm/s  Systemic Diam: 2.00 cm MV A velocity: 60.00 cm/s MV E/A ratio:  1.10 Jodelle Red MD Electronically signed by Jodelle Red MD Signature Date/Time: 01/27/2020/11:21:54 AM    Final    CT HEAD CODE STROKE WO CONTRAST  Result Date:  01/26/2020 CLINICAL DATA:  Code stroke. Neuro deficit, acute, stroke suspected. EXAM: CT HEAD WITHOUT CONTRAST TECHNIQUE: Contiguous axial images were obtained from the base of the skull through the vertex without intravenous contrast. COMPARISON:  None. FINDINGS: Brain: Area of cytotoxic edema with loss of gray-white differentiation involving the anterior left frontal lobe. Mild associated local mass effect with sulcal effacement. No midline shift. No evidence of acute hemorrhage, hydrocephalus, extra-axial collection or mass lesion/mass effect. Vascular: Calcific atherosclerosis. Skull: Normal. Negative for fracture or focal lesion. Sinuses/Orbits: No acute finding. Other: None. ASPECTS [facility] Stroke Program Early CT Score) - Ganglionic level infarction (caudate, lentiform nuclei, internal capsule, insula, M1-M3 cortex): 7 - Supraganglionic infarction (M4-M6 cortex): 2 Total score (0-10 with 10 being normal): 9 IMPRESSION: 1. Findings concerning for subacute infarct involving the left frontal lobe, as detailed above. Mild local mass effect without midline shift. No hemorrhagic transformation. 2. ASPECTS is 9 Code stroke imaging results were communicated on 01/26/2020 at 2:31 pm to provider Dr. Ezzie Dural via telephone, who verbally acknowledged these results. Electronically Signed   By: Feliberto Harts MD   On: 01/26/2020 14:35   VAS [place] LOWER EXTREMITY VENOUS (DVT)  Result Date: 01/27/2020  Lower Venous DVTStudy Indications: Stroke.  Comparison Study: No prior study Performing Technologist: Gertie Fey MHA, RDMS, RVT, RDCS  Examination Guidelines: A complete evaluation includes B-mode imaging, spectral Doppler, color Doppler, and power Doppler as needed of all accessible portions of each vessel. Bilateral testing is considered an integral part of a complete examination. Limited examinations for reoccurring indications may be performed as noted. The reflux portion of the exam is performed with the patient in  reverse Trendelenburg.  +---------+---------------+---------+-----------+----------+--------------+ RIGHT    CompressibilityPhasicitySpontaneityPropertiesThrombus Aging +---------+---------------+---------+-----------+----------+--------------+ CFV      Full           Yes      Yes                                 +---------+---------------+---------+-----------+----------+--------------+ SFJ      Full                                                        +---------+---------------+---------+-----------+----------+--------------+ FV Prox  Full                                                        +---------+---------------+---------+-----------+----------+--------------+  FV Mid   Full                                                        +---------+---------------+---------+-----------+----------+--------------+ FV DistalFull                                                        +---------+---------------+---------+-----------+----------+--------------+ PFV      Full                                                        +---------+---------------+---------+-----------+----------+--------------+ POP      Full           Yes      Yes                                 +---------+---------------+---------+-----------+----------+--------------+ PTV      Full                                                        +---------+---------------+---------+-----------+----------+--------------+ PERO     Full                                                        +---------+---------------+---------+-----------+----------+--------------+   +---------+---------------+---------+-----------+----------+--------------+ LEFT     CompressibilityPhasicitySpontaneityPropertiesThrombus Aging +---------+---------------+---------+-----------+----------+--------------+ CFV      Full           Yes      Yes                                  +---------+---------------+---------+-----------+----------+--------------+ SFJ      Full                                                        +---------+---------------+---------+-----------+----------+--------------+ FV Prox  Full                                                        +---------+---------------+---------+-----------+----------+--------------+ FV Mid   Full                                                        +---------+---------------+---------+-----------+----------+--------------+  FV DistalFull                                                        +---------+---------------+---------+-----------+----------+--------------+ PFV      Full                                                        +---------+---------------+---------+-----------+----------+--------------+ POP      Full           Yes      Yes                                 +---------+---------------+---------+-----------+----------+--------------+ PTV      Full                                                        +---------+---------------+---------+-----------+----------+--------------+ PERO     Full                                                        +---------+---------------+---------+-----------+----------+--------------+     Summary: RIGHT: - There is no evidence of deep vein thrombosis in the lower extremity.  - No cystic structure found in the popliteal fossa.  LEFT: - There is no evidence of deep vein thrombosis in the lower extremity.  - No cystic structure found in the popliteal fossa.  *See table(s) above for measurements and observations. Electronically signed by Coral Else MD on 01/27/2020 at 9:32:53 PM.    Final    CT ANGIO HEAD CODE STROKE  Result Date: 01/26/2020 CLINICAL DATA:  Neuro deficit, acute, stroke suspected. EXAM: CT ANGIOGRAPHY HEAD AND NECK CT PERFUSION BRAIN TECHNIQUE: Multidetector CT imaging of the head and neck was performed using  the standard protocol during bolus administration of intravenous contrast. Multiplanar CT image reconstructions and MIPs were obtained to evaluate the vascular anatomy. Carotid stenosis measurements (when applicable) are obtained utilizing NASCET criteria, using the distal internal carotid diameter as the denominator. Multiphase CT imaging of the brain was performed following IV bolus contrast injection. Subsequent parametric perfusion maps were calculated using RAPID software. CONTRAST:  OMNIPAQUE IOHEXOL 350 MG/ML SOLN COMPARISON:  CT head without contrast 01/26/2020 FINDINGS: CTA NECK FINDINGS Aortic arch: Common origin of the left common carotid artery and innominate artery is noted. No significant stenosis or vascular disease present the arch or great vessel origins. Right carotid system: Right common carotid artery is within normal limits. Bifurcation is unremarkable. Cervical right ICA is within normal limits. Left carotid system: Of the left common carotid artery is within normal limits. Bifurcation is unremarkable. Cervical left ICA is normal. Vertebral arteries: The right vertebral artery is the dominant vessel. Both vertebral arteries originate from the subclavians without significant stenosis. No significant stenosis is present in  either vertebral artery in the neck. Skeleton: There is some straightening of the normal cervical lordosis. No significant stenosis is present in either vertebral artery in the neck. Other neck: Soft tissues the neck are otherwise unremarkable. No significant adenopathy is present. Thyroid is normal. Salivary glands are within normal limits. Upper chest: Mild the lung apices are clear. Thoracic inlet is within normal limits. Review of the MIP images confirms the above findings CTA HEAD FINDINGS Anterior circulation: The internal carotid arteries are within normal limits from the high cervical segments through the ICA termini scratched at the internal carotid arteries are  within normal limits from the skull base through the ICA termini. The A1 and M1 segments are normal. The anterior communicating artery is patent. The MCA bifurcations are intact. The ACA and MCA branch vessels are within normal limits. Posterior circulation: Right vertebral artery is the dominant left Sol. PICA origins are visualized and normal. Basilar artery is within normal limits. The right posterior cerebral artery arises from the basilar tip. The left posterior cerebral artery is of fetal type. PCA branch vessels are within normal limits bilaterally. Venous sinuses: The dural sinuses are patent. The straight sinus and deep cerebral veins are intact. Cortical veins are unremarkable. Anatomic variants: Fetal type left posterior cerebral artery. Review of the MIP images confirms the above findings CT Brain Perfusion Findings: ASPECTS: 9/10 CBF (<30%) Volume: 0mL Perfusion (Tmax>6.0s) volume: 0mL Mismatch Volume: 0mL Infarction Location:N/A IMPRESSION: 1. Normal variant CTA Circle of Willis without significant proximal stenosis, aneurysm, or branch vessel occlusion. 2. Normal CTA of the neck. 3. CT perfusion is normal. Electronically Signed   By: Marin Roberts M.D.   On: 01/26/2020 14:54   CT ANGIO NECK CODE STROKE  Result Date: 01/26/2020 CLINICAL DATA:  Neuro deficit, acute, stroke suspected. EXAM: CT ANGIOGRAPHY HEAD AND NECK CT PERFUSION BRAIN TECHNIQUE: Multidetector CT imaging of the head and neck was performed using the standard protocol during bolus administration of intravenous contrast. Multiplanar CT image reconstructions and MIPs were obtained to evaluate the vascular anatomy. Carotid stenosis measurements (when applicable) are obtained utilizing NASCET criteria, using the distal internal carotid diameter as the denominator. Multiphase CT imaging of the brain was performed following IV bolus contrast injection. Subsequent parametric perfusion maps were calculated using RAPID software.  CONTRAST:  OMNIPAQUE IOHEXOL 350 MG/ML SOLN COMPARISON:  CT head without contrast 01/26/2020 FINDINGS: CTA NECK FINDINGS Aortic arch: Common origin of the left common carotid artery and innominate artery is noted. No significant stenosis or vascular disease present the arch or great vessel origins. Right carotid system: Right common carotid artery is within normal limits. Bifurcation is unremarkable. Cervical right ICA is within normal limits. Left carotid system: Of the left common carotid artery is within normal limits. Bifurcation is unremarkable. Cervical left ICA is normal. Vertebral arteries: The right vertebral artery is the dominant vessel. Both vertebral arteries originate from the subclavians without significant stenosis. No significant stenosis is present in either vertebral artery in the neck. Skeleton: There is some straightening of the normal cervical lordosis. No significant stenosis is present in either vertebral artery in the neck. Other neck: Soft tissues the neck are otherwise unremarkable. No significant adenopathy is present. Thyroid is normal. Salivary glands are within normal limits. Upper chest: Mild the lung apices are clear. Thoracic inlet is within normal limits. Review of the MIP images confirms the above findings CTA HEAD FINDINGS Anterior circulation: The internal carotid arteries are within normal limits from the high cervical  segments through the ICA termini scratched at the internal carotid arteries are within normal limits from the skull base through the ICA termini. The A1 and M1 segments are normal. The anterior communicating artery is patent. The MCA bifurcations are intact. The ACA and MCA branch vessels are within normal limits. Posterior circulation: Right vertebral artery is the dominant left Sol. PICA origins are visualized and normal. Basilar artery is within normal limits. The right posterior cerebral artery arises from the basilar tip. The left posterior cerebral  artery is of fetal type. PCA branch vessels are within normal limits bilaterally. Venous sinuses: The dural sinuses are patent. The straight sinus and deep cerebral veins are intact. Cortical veins are unremarkable. Anatomic variants: Fetal type left posterior cerebral artery. Review of the MIP images confirms the above findings CT Brain Perfusion Findings: ASPECTS: 9/10 CBF (<30%) Volume: 0mL Perfusion (Tmax>6.0s) volume: 0mL Mismatch Volume: 0mL Infarction Location:N/A IMPRESSION: 1. Normal variant CTA Circle of Willis without significant proximal stenosis, aneurysm, or branch vessel occlusion. 2. Normal CTA of the neck. 3. CT perfusion is normal. Electronically Signed   By: Marin Roberts M.D.   On: 01/26/2020 14:54     Discharge Instructions: Discharge Instructions    Diet - low sodium heart healthy   Complete by: As directed    Discharge instructions   Complete by: As directed    You were hospitalized for stroke. Thank you for allowing [place] to be part of your care.    Please follow up with the following providers:  Follow-up Information    [facility]. Go on 02/03/2020.   Why: at 9:45AM Contact information: [address] [place] [address] [phone]       PT OT Follow up.   Why: Paper scripts Contact information: Look for a clinic that is close to daughters place in [place]              Please note these changes made to your medications:   - Medications to start: Aspirin 81 mg daily for 21 days, clopidogrel 75 mg daily, and atorvastatin 40 mg daily   Please make sure to continue to decrease and stop smoking.  Please call our clinic if you have any questions or concerns, we may be able to help and keep you from a long and expensive emergency room wait. Our clinic and after hours phone number is [phone], the best time to call is Monday through Friday 9 am to 4 pm but there is always someone available 24/7 if you have an  emergency. If you need medication refills please notify your pharmacy one week in advance and they will send [place] a request.   Discharge instructions   Complete by: As directed    Please follow up with neurology in 4 weeks:  Micki Riley, MD Follow up in 4 week(s).   Specialties: Neurology, Radiology Contact information: [address] [place] [address] [phone]   For home use only DME Other see comment   Complete by: As directed    Patient needs outpatient PT, OT, SLP for neuro PT.   Length of Need: 6 Months   Increase activity slowly   Complete by: As directed       Signed: Dellia Cloud, MD 01/31/2020, 1:30 PM   Pager: [phone]

## 2020-01-31 NOTE — Plan of Care (Addendum)
Discussed with Dr. Excell Seltzer Cardiology and he reviewed the TEE images and did not feel there is any significant PFO which is consistent with TCD bubble study result. She is not candidate for any PFO closure procedure.   Marvel Plan, MD PhD Stroke Neurology 01/31/2020 11:31 AM

## 2020-02-01 ENCOUNTER — Encounter (HOSPITAL_COMMUNITY): Payer: Self-pay | Admitting: Cardiology

## 2020-02-01 LAB — VDRL, CSF: VDRL Quant, CSF: NONREACTIVE

## 2020-02-01 LAB — PROTHROMBIN GENE MUTATION

## 2020-02-01 LAB — FACTOR 5 LEIDEN

## 2020-02-03 ENCOUNTER — Encounter: Payer: Self-pay | Admitting: Student

## 2020-02-03 ENCOUNTER — Other Ambulatory Visit: Payer: Self-pay | Admitting: *Deleted

## 2020-02-03 ENCOUNTER — Ambulatory Visit (INDEPENDENT_AMBULATORY_CARE_PROVIDER_SITE_OTHER): Payer: Medicaid Other | Admitting: Student

## 2020-02-03 ENCOUNTER — Other Ambulatory Visit: Payer: Self-pay

## 2020-02-03 VITALS — BP 116/72 | HR 60 | Temp 97.9°F | Ht 66.0 in | Wt 207.0 lb

## 2020-02-03 DIAGNOSIS — I6930 Unspecified sequelae of cerebral infarction: Secondary | ICD-10-CM

## 2020-02-03 DIAGNOSIS — E039 Hypothyroidism, unspecified: Secondary | ICD-10-CM | POA: Diagnosis not present

## 2020-02-03 DIAGNOSIS — F17200 Nicotine dependence, unspecified, uncomplicated: Secondary | ICD-10-CM

## 2020-02-03 DIAGNOSIS — R7989 Other specified abnormal findings of blood chemistry: Secondary | ICD-10-CM

## 2020-02-03 DIAGNOSIS — E038 Other specified hypothyroidism: Secondary | ICD-10-CM | POA: Insufficient documentation

## 2020-02-03 DIAGNOSIS — E7800 Pure hypercholesterolemia, unspecified: Secondary | ICD-10-CM

## 2020-02-03 DIAGNOSIS — R7303 Prediabetes: Secondary | ICD-10-CM | POA: Diagnosis not present

## 2020-02-03 DIAGNOSIS — F17210 Nicotine dependence, cigarettes, uncomplicated: Secondary | ICD-10-CM | POA: Diagnosis not present

## 2020-02-03 MED ORDER — NICOTINE 14 MG/24HR TD PT24: 14.0000 mg | MEDICATED_PATCH | TRANSDERMAL | 2 refills | Status: AC

## 2020-02-03 MED ORDER — ATORVASTATIN CALCIUM 40 MG PO TABS: 40.0000 mg | ORAL_TABLET | Freq: Every day | ORAL | 0 refills | Status: DC

## 2020-02-03 MED ORDER — ASPIRIN EC 81 MG PO TBEC
81.0000 mg | DELAYED_RELEASE_TABLET | Freq: Every day | ORAL | 0 refills | Status: AC
Start: 2020-02-03 — End: 2020-05-03

## 2020-02-03 MED ORDER — LEVOTHYROXINE SODIUM 150 MCG PO TABS: 150.0000 ug | ORAL_TABLET | Freq: Every day | ORAL | 0 refills | Status: DC

## 2020-02-03 MED FILL — LEVOTHYROXINE SODIUM 150 MC: 150 | 90 days supply | Qty: 90 | Fill #0

## 2020-02-03 NOTE — Assessment & Plan Note (Signed)
During hospitalization, found to have A1c 5.8. Discussed with patient risk for further strokes with diabetes. She currently desires to stay off medication and will try lifestyle modification. Patient given information on diabetic diet. Will follow-up with patient in 3-4 months to re-assess.

## 2020-02-03 NOTE — Assessment & Plan Note (Signed)
While hospitalized, patient found to have LDL 125, goal <70. Started on atorvastatin 40mg  once per day.   Lipid Panel     Component Value Date/Time   CHOL 181 01/27/2020 0500   TRIG 69 01/27/2020 0500   HDL 42 01/27/2020 0500   CHOLHDL 4.3 01/27/2020 0500   VLDL 14 01/27/2020 0500   LDLCALC 125 (H) 01/27/2020 0500   -Continue atorvastatin 40mg  oncer per day

## 2020-02-03 NOTE — Assessment & Plan Note (Signed)
Patient mentions she previously smoked 1-2 packs per day for 30 years. She states she is motivated to stop quitting so she will not have another stroke. Mentions she has not had a cigarette since discharge 01/30/20. She has been taking a friend's nicotine lozenges but requests nicotine patch as she had in the hospital.  A/P: -Start nicotine patch 14mg  once per day

## 2020-02-03 NOTE — Assessment & Plan Note (Signed)
Patient says she was previously on thyroid medication following her previous CVA four years ago. States she was previously taking Synthroid which helped with her overall energy. She says she now often feels run-down and without energy. Denies chest pain, palpitations, diarrhea, constipation, skin/hair changes, cold intolerance. TSH during recent admission 20, T4 0.67.   A/P: -Will start weight-based dosing at levothyroxine once per day -We will re-check labs and dosing at next visit in 6 weeks.

## 2020-02-03 NOTE — Assessment & Plan Note (Signed)
Patient recently admitted for L frontal lobe CVA, discharged 01/30/20. Mentions she still has some residual right sided weakness in arm and leg, but has improved since discharge. Since she is uninsured, she has not been able to go to physical therapy, although she states she continues to do the exercises she was taught while inpatient. Says her speech has also slowly improved. She previously had CVA four years ago that presented as generalized weakness and dizziness.  A/P: -RF include tobacco use disorder, hyperlipidemia, obesity, pre-diabetes -Patient moving to [facility], will set her up with financial counselors here at [facility] -Patient to follow-up with Dr. Pearlean Brownie at neurology clinic -Will continue lifestyle modifications and other risk factors to prevent further strokes.

## 2020-02-03 NOTE — Patient Outreach (Signed)
[facility]) Care Management  02/03/2020  Maria Porter 02/14/1976 [id number]   Subjective: Telephone call to patient's home number, spoke with patient, and HIPAA verified.  Discussed [facility] Care Management Self Pay EMMI Stroke Red Flag Alert follow up, patient voiced understanding, and is in agreement to brief follow up.  Patient states she is doing fine, lives with sister Maria Porter) in [place], currently on her way to [place] with daughter, to stay with daughter, and requested that RNCM speak with her daughter Maria Porter).   States she remembers receiving EMMI automated calls, is currently unable to set up rehab, because she does not have any insurance, no provider will provide services.   States her sister and daughter assist her as needed.  Patient gave verbal consent to speak with daughter Maria Porter) regarding her healthcare needs as needed.  Spoke with patient's daughter Maria Porter), discussed [facility] Care Management Self Pay EMMI Stroke Red Flag Alert follow up, daughter voiced understanding, and is in agreement to brief follow up on patient's behalf.  Daughter states patient started a new job approximately 1 month prior to recent stroke, insurance has not started, patient is not eligible for medical leave, and can not return to work until she is better.  States patient had a follow up visit with her new primary MD today in the [facility] and the appointment went great.   States patient was given financial assistance paperwork at her appointment to fill out, is planning to completed paperwork, will give completed paperwork to patient's sister to drop off at the clinic, next week.   States patient was also able to pick up medication from the pharmacy after her appointment.  States she also was given the name of a contact person to contact regarding the paperwork if questions arise and will contact that person as needed.   Daughter states patient  may need care coordination assistance and is planning to call patient's employer and/ or patient will call to verify patient's insurance status.   Daughter states she is unable to complete assessment at this time and will call this RNCM at a later time to complete.    Objective: Per KPN (Knowledge Performance Now, point of care tool) and chart review, patient hospitalized 9/21/202 - 01/30/2020 for Acute CVA.  Patient also has a history of hyperlipidemia, tobacco abuse disorder, and prediabetes.     Assessment: Received Self Pay EMMI Stroke Red Flag Alert follow up referral on 02/03/2020. Red Flag Alert Trigger, Day #1, patient answered yes to the following question: Problems setting up rehab?   EMMI follow up completed and will follow up to assess further care management needs.      Plan: RNCM will send request to Iverson Alamin at [facility] Care Management to add patient's daughter Cira Deyoe, [phone]) to her emergency contact in [facility] / Epic, per patient's request.   RNCM will send unsuccessful outreach letter, [facility] pamphlet, handout: Know Before You Go, will call patient for 2nd telephone outreach attempt within 4 business days, [facility] EMMI follow up, assess care management needs,  proceed with case closure, after 4th unsuccessful outreach call.       Savyon Loken H. Gardiner Barefoot, BSN, CCM [facility] Care Management [facility] Telephonic CM Phone: [phone] Fax: [phone]

## 2020-02-03 NOTE — Patient Instructions (Addendum)
Ms. Saperstein,  It was a pleasure seeing you today!  During our visit today we discussed your recent hospitalization for stroke. I am glad you are doing better! Continue doing the exercises given to you, and we will set you up with physical therapy once you get your orange card with our financial counselors. -Continue taking aspirin 81mg  once per day, atorvastatin 40mg  once per day -You will take clopidogrel 75mg  oncer per day until 03/01/20 -I have prescribed nicotine patches for smoking cessation. Congratulations on starting to quit smoking! -Your A1c was 5.8 in the hospital, which is considered pre-diabetes. We will hold off on medications and work on our diet. We will re-check this in a few months to see if we need to start medication.  I have also prescribed levothyroxine once per day for hypothyroidism. If you experience chest pain, palpitations, diarrhea, or profuse sweating, please let know and we can adjust the dose.  We will call you with the results of the blood work we did today.  We look forward to seeing you next time. Please call our clinic at [phone] if you have any questions or concerns. The best time to call is Monday-Friday from 9am-4pm, but there is someone available 24/7 at the same number. If you need medication refills, please notify your pharmacy one week in advance and they will send a request.  Thank you for letting us take part in your care. Wishing you the best!  Thank you, Dr. [phone], MD

## 2020-02-03 NOTE — Progress Notes (Signed)
   CC: hospitalization follow-up  HPI:  [name] is a 44 y.o. with medical history as below who presents to clinic for hospitalization follow-up appointment.  Please see problem-based list for further evaluation and detailed planning.  Past Medical History:  Diagnosis Date  . Stroke [facility])    Review of Systems:  As per HPI  Physical Exam:  Vitals:   02/03/20 0925  BP: 116/72  Pulse: 60  Temp: 97.9 F (36.6 C)  TempSrc: Oral  SpO2: 100%  Weight: 207 lb (93.9 kg)  Height: 5\' 6"  (1.676 m)   General: Pleasant, sitting in chair, no acute distress CV: Regular rate, rhythm. No murmurs, rubs, gallops. Pulm: Clear to auscultation bilaterally. Neuro: Cranial nerves in tact. Sensation in tact throughout. 4/5 right-sided weakness upper and lower extremities. Left upper and lower extremities 5/5.  Assessment & Plan:   See Encounters Tab for problem based charting.  Patient seen with Dr. 

## 2020-02-04 ENCOUNTER — Encounter: Payer: Self-pay | Admitting: Student

## 2020-02-04 LAB — CBC
Hematocrit: 43.4 % (ref 34.0–46.6)
Hemoglobin: 14.2 g/dL (ref 11.1–15.9)
MCH: 29.3 pg (ref 26.6–33.0)
MCHC: 32.7 g/dL (ref 31.5–35.7)
MCV: 90 fL (ref 79–97)
Platelets: 307 10*3/uL (ref 150–450)
RBC: 4.84 x10E6/uL (ref 3.77–5.28)
RDW: 12.3 % (ref 11.7–15.4)
WBC: 6.7 10*3/uL (ref 3.4–10.8)

## 2020-02-04 LAB — CMP14 + ANION GAP
ALT: 43 IU/L — ABNORMAL HIGH (ref 0–32)
AST: 24 IU/L (ref 0–40)
Albumin/Globulin Ratio: 1.3 (ref 1.2–2.2)
Albumin: 4.3 g/dL (ref 3.8–4.8)
Alkaline Phosphatase: 137 IU/L — ABNORMAL HIGH (ref 44–121)
Anion Gap: 16 mmol/L (ref 10.0–18.0)
BUN/Creatinine Ratio: 12 (ref 9–23)
BUN: 10 mg/dL (ref 6–24)
Bilirubin Total: <0.2 mg/dL (ref 0.0–1.2)
CO2: 22 mmol/L (ref 20–29)
Calcium: 9.6 mg/dL (ref 8.7–10.2)
Chloride: 101 mmol/L (ref 96–106)
Creatinine, Ser: 0.83 mg/dL (ref 0.57–1.00)
GFR calc Af Amer: 98 mL/min/{1.73_m2} (ref >59)
GFR calc non Af Amer: 85 mL/min/{1.73_m2} (ref >59)
Globulin, Total: 3.3 g/dL (ref 1.5–4.5)
Glucose: 97 mg/dL (ref 65–99)
Potassium: 4.1 mmol/L (ref 3.5–5.2)
Sodium: 139 mmol/L (ref 134–144)
Total Protein: 7.6 g/dL (ref 6.0–8.5)

## 2020-02-05 DIAGNOSIS — R7989 Other specified abnormal findings of blood chemistry: Secondary | ICD-10-CM | POA: Insufficient documentation

## 2020-02-05 NOTE — Assessment & Plan Note (Signed)
ADDENDUM: Hepatic Function Latest Ref Rng & Units 02/03/2020 01/27/2020 01/26/2020  Total Protein 6.0 - 8.5 g/dL 7.6 7.0 8.0  Albumin 3.8 - 4.8 g/dL 4.3 3.6 4.0  AST 0 - 40 IU/L _0 ALT 0 - 32 IU/L 43(H) 18 20  Alk Phosphatase 44 - 121 IU/L 137(H) 89 101  Total Bilirubin 0.0 - 1.2 mg/dL <0.2 0.3 0.4   Lab results during visit revealed elevated ALT, alk phos with normal values one week prior. Will obtain hepatitis serologies, iron studies, and RUQ ultrasound.

## 2020-02-05 NOTE — Addendum Note (Signed)
Addended byEvlyn Kanner on: 02/05/2020 02:02 PM   Modules accepted: Orders

## 2020-02-05 NOTE — Progress Notes (Signed)
Patient isolated elevated ALT, Alk phos. CMP one week prior revealed normal values. Attempted call to patient regarding results 02/05/2020, no answer. Plan to obtain hepatitis serologies, iron studies, and RUQ ultrasound.

## 2020-02-08 ENCOUNTER — Telehealth: Payer: Self-pay | Admitting: Student

## 2020-02-08 NOTE — Telephone Encounter (Signed)
Attempted to contact patient at the request of Dr. Marijo Conception to schedule a lab appointment, but no answer.  Left detailed message to please call the office back.  Forwarding this information to yellow team.

## 2020-02-08 NOTE — Telephone Encounter (Signed)
-----   Message from Evlyn Kanner, MD sent at 02/08/2020  2:24 PM EDT ----- Good afternoon! I need to have this patient scheduled for a lab-only appointment sometime in the next week or two. I appreciate the help!  Thank you! Aneta Mins

## 2020-02-08 NOTE — Progress Notes (Signed)
Internal Medicine Clinic Attending  I saw and evaluated the patient.  I personally confirmed the key portions of the history and exam documented by Dr. Braswell and I reviewed pertinent patient test results.  The assessment, diagnosis, and plan were formulated together and I agree with the documentation in the resident's note.  

## 2020-02-09 ENCOUNTER — Other Ambulatory Visit: Payer: Self-pay | Admitting: *Deleted

## 2020-02-09 NOTE — Patient Outreach (Signed)
[facility]) Care Management  02/09/2020  Benny Henrie 01-Jun-1975 [id number]   Subjective: Per patient previous consent, patient gave verbal consent to speak with daughter Jameelah Watts) regarding her healthcare needs as needed.  Telephone call to patient's daughter home number, no answer, message states no mailbox full, and unable to leave a message.    Objective: Per KPN (Knowledge Performance Now, point of care tool) and chart review, patient hospitalized 9/21/202 - 01/30/2020 for Acute CVA.  Patient also has a history of hyperlipidemia, tobacco abuse disorder, and prediabetes.     Assessment: Received Self Pay EMMI Stroke Red Flag Alert follow up referral on 02/03/2020. Red Flag Alert Trigger, Day #1, patient answered yes to the following question: Problems setting up rehab?   EMMI follow up completed and will follow up to assess further care management needs, pending contact with patient's daughter.      Plan: RNCM has  sent unsuccessful outreach letter, [facility] pamphlet, handout: Know Before You Go, will call patient and/ or patient's daughter Klara Stjames)  for 3rd telephone outreach attempt within 4 business days, [facility] EMMI follow up, assess care management needs,  proceed with case closure, after 4th unsuccessful outreach call.      Pamelyn Bancroft H. Gardiner Barefoot, BSN, CCM [facility] Care Management [facility] Telephonic CM Phone: [phone] Fax: [phone]

## 2020-02-10 ENCOUNTER — Other Ambulatory Visit: Payer: Self-pay | Admitting: Student

## 2020-02-10 ENCOUNTER — Telehealth: Payer: Self-pay | Admitting: Student

## 2020-02-10 DIAGNOSIS — R7989 Other specified abnormal findings of blood chemistry: Secondary | ICD-10-CM

## 2020-02-10 NOTE — Telephone Encounter (Signed)
Spoke with patient's daughter over the phone with permission to discuss care, and daughter states that her mother is recovering from CVA with R-sided weakness with her in [place] currently. Discussed that she may benefit from coming in sooner for hepatitis and iron labs if she is able to gain enough strength back, but informed patient that these labs may be drawn at next scheduled appointment in mid-November if that is not the case. Will inform PCP and ordering provider.  Glenford Bayley, PGY1 Internal Medicine [phone]

## 2020-02-12 ENCOUNTER — Other Ambulatory Visit: Payer: Self-pay | Admitting: *Deleted

## 2020-02-12 NOTE — Patient Outreach (Signed)
[facility]) Care Management  02/12/2020  Maria Porter [birth year] [id number]   Subjective: Per patient previous consent, patient gave verbal consent to speak with daughter Bijou Easler) regarding her healthcare needs as needed.  Telephone call to patient's daughter home number, times 2, no answer, message states subscriber not in service at this time, and unable to leave a message.     Objective:Per KPN (Knowledge Performance Now, point of care tool) and chart review,patient hospitalized 9/21/202 - 01/30/2020 for Acute CVA. Patient also has a history of hyperlipidemia, tobacco abuse disorder, and prediabetes.    Assessment: Received Self Pay EMMI Stroke Red Flag Alert follow up referral on 02/11/2020.   Red Flag Alert Triggers, times 2, Day # 9, patient answered yes to the following questions: Lost interest in things they used to enjoy?   Sad, hopeless, anxious, or empty?    Received Self Pay EMMI Stroke Red Flag Alert follow up referral on 02/03/2020. Red Flag Alert Trigger, Day #1, patient answered yes to the following question:Problems setting up rehab?EMMI follow up completed and will follow up to assess further care management needs, pending contact with patient's daughter and/ or patient.      Plan: RNCM has  sent unsuccessful outreach letter, [facility] pamphlet, handout: Know Before You Go, will call patient and/ or patient's daughter Maria Porter)  for 4th  telephone outreach attempt within 7 business days, [facility] EMMI follow up, assess care management needs,proceed with case closure, after 4th unsuccessful outreach call.      Elyse Prevo H. Gardiner Barefoot, BSN, CCM [facility] Care Management [facility] Telephonic CM Phone: [phone] Fax: [phone]

## 2020-02-16 ENCOUNTER — Other Ambulatory Visit: Payer: Self-pay | Admitting: *Deleted

## 2020-02-16 NOTE — Patient Outreach (Addendum)
[facility]) Care Management  02/16/2020  Maria Porter 1976/04/28 [id number]   Subjective: Telephone call to patient's home number, no answer, left HIPAA compliant voicemail message, and requested call back.   Objective:Per KPN (Knowledge Performance Now, point of care tool) and chart review,patient hospitalized 9/21/202 - 01/30/2020 for Acute CVA. Patient also has a history of hyperlipidemia, tobacco abuse disorder, and prediabetes.     Assessment: Received Self Pay EMMI Stroke Red Flag Alert follow up referral on 02/11/2020.   Red Flag Alert Triggers, times 2, Day # 9, patient answered yes to the following questions: Lost interest in things they used to enjoy?   Sad, hopeless, anxious, or empty?    Received Self Pay EMMI Stroke Red Flag Alert follow up referral on 02/03/2020. Red Flag Alert Trigger, Day #1, patient answered yes to the following question:Problems setting up rehab?EMMI follow up completedandwill follow up to assess further care management needs, pending contact with patient's daughter and/ or patient.     Plan: RNCMhassentunsuccessful outreach letter, [facility] pamphlet, handout: Know Before You Go, newly assigned RNCM will call patient and/ or patient's daughter Maria Porter)for 5th  telephone outreach attempt within 21 business days, [facility] EMMI follow up, assess care management needs,proceed with case closure, after 5th unsuccessful outreach call.      Brinleigh Tew H. Gardiner Barefoot, BSN, CCM [facility] Care Management [facility] Telephonic CM Phone: [phone] Fax: [phone]

## 2020-03-04 ENCOUNTER — Ambulatory Visit (HOSPITAL_COMMUNITY): Payer: MEDICAID

## 2020-03-08 ENCOUNTER — Ambulatory Visit: Payer: Self-pay | Admitting: *Deleted

## 2020-03-08 ENCOUNTER — Other Ambulatory Visit: Payer: Self-pay | Admitting: *Deleted

## 2020-03-08 NOTE — Patient Outreach (Signed)
[facility]) Care Management  03/08/2020  Maria Porter 1975/09/09 [id number]   Member reassigned to this RNCM to follow up on stroke recovery.  Call placed to member, no answer, HIPAA compliant voice message left.  Call then placed to daughter, Bennie Hind, recording state number is not in service. Per previously assigned RNCM, this is the 5th unsuccessful outreach attempt.  Will close case due to inability to maintain contact.  Kemper Durie, [place], MSN [facility] Care Management  [facility] Manager [phone]

## 2020-03-21 ENCOUNTER — Ambulatory Visit (HOSPITAL_COMMUNITY): Payer: Self-pay

## 2020-03-21 ENCOUNTER — Ambulatory Visit (HOSPITAL_COMMUNITY)
Admission: RE | Admit: 2020-03-21 | Discharge: 2020-03-21 | Disposition: A | Discharge status: Home / Self Care | Payer: Medicaid Other | Source: Ambulatory Visit | Attending: Internal Medicine | Admitting: Internal Medicine

## 2020-03-21 ENCOUNTER — Other Ambulatory Visit: Payer: Self-pay

## 2020-03-21 ENCOUNTER — Ambulatory Visit: Payer: Self-pay | Admitting: Student

## 2020-03-21 ENCOUNTER — Encounter: Payer: Self-pay | Admitting: Student

## 2020-03-21 VITALS — BP 103/70 | HR 67 | Temp 97.8°F | Ht 66.0 in | Wt 216.4 lb

## 2020-03-21 DIAGNOSIS — E039 Hypothyroidism, unspecified: Secondary | ICD-10-CM

## 2020-03-21 DIAGNOSIS — R7989 Other specified abnormal findings of blood chemistry: Secondary | ICD-10-CM | POA: Diagnosis not present

## 2020-03-21 NOTE — Addendum Note (Signed)
Addended by: Bufford Spikes on: 03/21/2020 01:58 PM   Modules accepted: Orders

## 2020-03-22 LAB — HEPATITIS C ANTIBODY: Hep C Virus Ab: <0.1 s/co ratio (ref 0.0–0.9)

## 2020-03-22 LAB — FERRITIN: Ferritin: 149 ng/mL (ref 15–150)

## 2020-03-22 LAB — IRON AND TIBC
Iron Saturation: 31 % (ref 15–55)
Iron: 87 ug/dL (ref 27–159)
Total Iron Binding Capacity: 285 ug/dL (ref 250–450)
UIBC: 198 ug/dL (ref 131–425)

## 2020-03-22 LAB — HEPATITIS B SURFACE ANTIBODY,QUALITATIVE: Hep B Surface Ab, Qual: REACTIVE

## 2020-03-22 LAB — TSH: TSH: 7.27 u[IU]/mL — ABNORMAL HIGH (ref 0.450–4.500)

## 2020-03-22 LAB — HEPATITIS A ANTIBODY, TOTAL: hep A Total Ab: NEGATIVE

## 2020-03-22 LAB — HEPATITIS B SURFACE ANTIGEN: Hepatitis B Surface Ag: NEGATIVE

## 2020-03-22 NOTE — Progress Notes (Signed)
Patient's appointment today was to be evaluated after she had her hepatic workup for elevated AlkPhos and ALT. Patient also scheduled to have RUQ ultrasound performed at [facility] at 2:30. In order for her to make the appointment the patient received her labs and was sent directly to [place]. She was not seen or evaluated by me.

## 2020-09-14 DIAGNOSIS — I82421 Acute embolism and thrombosis of right iliac vein: Secondary | ICD-10-CM

## 2020-09-14 DIAGNOSIS — Z8249 Family history of ischemic heart disease and other diseases of the circulatory system: Secondary | ICD-10-CM | POA: Insufficient documentation

## 2020-09-14 DIAGNOSIS — F17210 Nicotine dependence, cigarettes, uncomplicated: Secondary | ICD-10-CM | POA: Insufficient documentation

## 2020-09-14 DIAGNOSIS — G4733 Obstructive sleep apnea (adult) (pediatric): Secondary | ICD-10-CM | POA: Insufficient documentation

## 2020-09-14 HISTORY — DX: Acute embolism and thrombosis of right iliac vein: I82.421

## 2020-09-30 ENCOUNTER — Encounter: Payer: Medicaid Other | Admitting: Student

## 2020-10-06 ENCOUNTER — Other Ambulatory Visit: Payer: Self-pay

## 2020-10-06 ENCOUNTER — Other Ambulatory Visit (HOSPITAL_COMMUNITY): Payer: Self-pay

## 2020-10-06 ENCOUNTER — Ambulatory Visit (INDEPENDENT_AMBULATORY_CARE_PROVIDER_SITE_OTHER): Payer: Medicaid Other | Admitting: Internal Medicine

## 2020-10-06 ENCOUNTER — Encounter: Payer: Self-pay | Admitting: Internal Medicine

## 2020-10-06 VITALS — BP 118/82 | HR 91 | Temp 98.2°F | Wt 201.9 lb

## 2020-10-06 DIAGNOSIS — I6930 Unspecified sequelae of cerebral infarction: Secondary | ICD-10-CM

## 2020-10-06 DIAGNOSIS — I82409 Acute embolism and thrombosis of unspecified deep veins of unspecified lower extremity: Secondary | ICD-10-CM | POA: Insufficient documentation

## 2020-10-06 DIAGNOSIS — F17200 Nicotine dependence, unspecified, uncomplicated: Secondary | ICD-10-CM

## 2020-10-06 DIAGNOSIS — E039 Hypothyroidism, unspecified: Secondary | ICD-10-CM | POA: Diagnosis not present

## 2020-10-06 DIAGNOSIS — E038 Other specified hypothyroidism: Secondary | ICD-10-CM | POA: Diagnosis not present

## 2020-10-06 DIAGNOSIS — R7303 Prediabetes: Secondary | ICD-10-CM | POA: Diagnosis not present

## 2020-10-06 DIAGNOSIS — R7989 Other specified abnormal findings of blood chemistry: Secondary | ICD-10-CM

## 2020-10-06 DIAGNOSIS — E7800 Pure hypercholesterolemia, unspecified: Secondary | ICD-10-CM

## 2020-10-06 DIAGNOSIS — I82421 Acute embolism and thrombosis of right iliac vein: Secondary | ICD-10-CM | POA: Diagnosis not present

## 2020-10-06 DIAGNOSIS — Z86718 Personal history of other venous thrombosis and embolism: Secondary | ICD-10-CM | POA: Insufficient documentation

## 2020-10-06 MED ORDER — APIXABAN 5 MG PO TABS
5.0000 mg | ORAL_TABLET | Freq: Two times a day (BID) | ORAL | 0 refills | Status: DC
Start: 2020-10-06 — End: 2020-12-22
  Filled 2020-10-06 (×2): qty 180, 90d supply, fill #0

## 2020-10-06 MED ORDER — ATORVASTATIN CALCIUM 40 MG PO TABS
40.0000 mg | ORAL_TABLET | Freq: Every day | ORAL | 0 refills | Status: DC
  Filled 2020-10-06 (×2): qty 90, 90d supply, fill #0

## 2020-10-06 MED ORDER — ALBUTEROL SULFATE HFA 108 (90 BASE) MCG/ACT IN AERS
1.0000 | INHALATION_SPRAY | Freq: Four times a day (QID) | RESPIRATORY_TRACT | 1 refills | Status: DC | PRN
  Filled 2020-10-06: qty 8.5, 25d supply, fill #0

## 2020-10-06 MED ORDER — LEVOTHYROXINE SODIUM 150 MCG PO TABS
150.0000 ug | ORAL_TABLET | Freq: Every day | ORAL | 0 refills | Status: DC
  Filled 2020-10-06: qty 90, 90d supply, fill #0

## 2020-10-06 NOTE — Progress Notes (Signed)
   CC: Follow-up hypothyroidism, acute CVA  HPI:  [name] is a 45 y.o. with medical history significant for prior CVA, hypothyroidism and elevated BMI presents and for follow-up.  Please see problem based charting for further details.  Past Medical History:  Diagnosis Date  . Stroke [facility])    Review of Systems:  AS per HPI  Physical Exam:  Vitals:   10/06/20 1442  BP: 118/82  Pulse: 91  Temp: 98.2 F (36.8 C)  TempSrc: Oral  SpO2: 98%  Weight: 201 lb 14.4 oz (91.6 kg)   Physical Exam Vitals and nursing note reviewed.  HENT:     Head: Normocephalic and atraumatic.  Cardiovascular:     Rate and Rhythm: Normal rate.     Heart sounds: Normal heart sounds.  Pulmonary:     Breath sounds: Normal breath sounds.  Abdominal:     General: Bowel sounds are normal.  Musculoskeletal:     Right lower leg: No edema.     Left lower leg: No edema.  Skin:    General: Skin is warm.  Neurological:     Mental Status: She is alert.     Comments: Neurologic exam: Speech: Dysarthric  Mental status: A&O Cranial Nerves: III, IV, VI: Extra-occular motions intact bilaterally V, VII: Right sided facial droop, sensation decreased at the right lower face VIII: hearing normal  XI: Head turn and shoulder shrug normal bilaterally (decreased at the right shoulder) Motor:  -4/5 at the RUE -3/5 at the RLE -5/5 at the LUE and LLE Deep Tendon Reflexes: 2+ symmetric   Gait:Normal with a cane   Sensory: Light touch intact and symmetric bilaterally  Coordination: There is no dysmetria on finger-to-nose. Rapid alternating movement test normal.  Psychiatric: Normal mood and affect  Psychiatric:        Mood and Affect: Mood normal.     Assessment & Plan:   See Encounters Tab for problem based charting.  Patient discussed with Dr. Antony Contras

## 2020-10-06 NOTE — Assessment & Plan Note (Signed)
#  Acute DVT of the right iliac vein: She was started on heparin infusion and transition to oral anticoagulant.  She was discharged on Eliquis 5 mg twice daily

## 2020-10-06 NOTE — Patient Instructions (Signed)
[name],   Thanks for seeing me today. I will refill all your medications and order some labs.   I will also refer you to neurology as well  Take care! Dr. Dortha Schwalbe  Please call the internal medicine center clinic if you have any questions or concerns, we may be able to help and keep you from a long and expensive emergency room wait. Our clinic and after hours phone number is [phone], the best time to call is Monday through Friday 9 am to 4 pm but there is always someone available 24/7 if you have an emergency. If you need medication refills please notify your pharmacy one week in advance and they will send [place] a request.   If you have not gotten the COVID vaccine, I recommend doing so:  You may get it at your local [facility] or [facility] OR To schedule an appointment for a COVID vaccine or be added to the vaccine wait list: Go to [email or web address]   OR Go to [email or web address]                  OR Call [phone]                                     OR Call [phone] and select Option 2

## 2020-10-06 NOTE — Assessment & Plan Note (Signed)
#  Prediabetes: A1c was 5.8% in September 2021.  We will follow-up hemoglobin A1c

## 2020-10-06 NOTE — Assessment & Plan Note (Signed)
#  Acute left MCA stroke #Suspicion of cardioembolic CVA She was recently admitted to the hospital from Sep 21, 2020 to Sep 22, 2020 for acute CVA when she presented with right-sided weakness and aphasia.  CT angiography of the head showed occlusion of the left MCA.  MRI of the brain showed multifocal acute infarcts of the multiple territories in the left frontal lobe, [place] , right frontal lobe suggesting thromboembolic etiology however hypercoagulable panel performed in 2018 and 2021 was unremarkable.  She subsequently received tPA and transferred to [facility] clinic from an outside facility for emergent mechanical thrombectomy.  A TEE was performed which showed no embolic source. Prior work-up including lupus anticoagulant panel, factor V Leiden, prothrombin gene mutation, cardiolipin antibodies, beta-2 glycoprotein antibodies, protein S total, protein S activity, protein C total, protein C activity, Antithrombin III have been unremarkable.  During her admission for CVA in September 2021, she was found to have a left frontal, cortical and subcortical CVA.  TTE and TEE showed a small PFO however this was thought to not be the likely culprit for her stroke and was discharged with 3 weeks of DAPT.  Plan: - Continue aspirin 81 mg daily - Continue Eliquis 5 mg twice daily (given her concurrent acute unprovoked right iliac vein DVT) - Refer to neurology

## 2020-10-06 NOTE — Assessment & Plan Note (Signed)
#  Subclinical hypothyroidism: Currently she is on Synthroid 150 mcg daily.  She states that in the past she has been inconsistent with taking her Synthroid.  TSH in September 2021 was 20, November 2021 was 7.2, repeat at [facility] clinic was 14  Plan: - Follow-up TSH, free T4 and total T3 - Continue Synthroid 150 mcg for now with plan to titrate based on thyroid labs

## 2020-10-07 LAB — HEMOGLOBIN A1C
Est. average glucose Bld gHb Est-mCnc: 126 mg/dL
Hgb A1c MFr Bld: 6 % — ABNORMAL HIGH (ref 4.8–5.6)

## 2020-10-07 LAB — T4, FREE: Free T4: 1.21 ng/dL (ref 0.82–1.77)

## 2020-10-07 LAB — TSH: TSH: 2.34 u[IU]/mL (ref 0.450–4.500)

## 2020-10-07 LAB — T3: T3, Total: 155 ng/dL (ref 71–180)

## 2020-10-09 NOTE — Progress Notes (Signed)
Internal Medicine Clinic Attending  Case discussed with Dr. Agyei  At the time of the visit.  We reviewed the resident's history and exam and pertinent patient test results.  I agree with the assessment, diagnosis, and plan of care documented in the resident's note.  

## 2020-10-19 ENCOUNTER — Other Ambulatory Visit (HOSPITAL_COMMUNITY): Payer: Self-pay

## 2020-11-01 ENCOUNTER — Ambulatory Visit: Payer: Medicaid Other | Admitting: Neurology

## 2020-11-01 ENCOUNTER — Encounter: Payer: Self-pay | Admitting: Neurology

## 2020-11-01 VITALS — BP 127/80 | HR 76 | Ht 66.0 in | Wt 201.4 lb

## 2020-11-01 DIAGNOSIS — G4733 Obstructive sleep apnea (adult) (pediatric): Secondary | ICD-10-CM

## 2020-11-01 DIAGNOSIS — I82403 Acute embolism and thrombosis of unspecified deep veins of lower extremity, bilateral: Secondary | ICD-10-CM | POA: Diagnosis not present

## 2020-11-01 DIAGNOSIS — I639 Cerebral infarction, unspecified: Secondary | ICD-10-CM | POA: Diagnosis not present

## 2020-11-01 DIAGNOSIS — I69320 Aphasia following cerebral infarction: Secondary | ICD-10-CM | POA: Diagnosis not present

## 2020-11-01 DIAGNOSIS — G8112 Spastic hemiplegia affecting left dominant side: Secondary | ICD-10-CM

## 2020-11-01 NOTE — Progress Notes (Signed)
[facility] [address] [place]. [place] [address] [phone]       OFFICE CONSULT NOTE  [name] Date of Birth:  29-Nov-1975 Medical Record Number:  [id number]   Referring MD: Axel Filler  Reason for Referral: Stroke  HPI: Maria Porter is a 45 year old African-American lady seen today for initial office consultation visit for stroke.  History is obtained from the patient and review of electronic medical records as well as Care Everywhere and I personally reviewed pertinent imaging films in PACS.  She has past medical history of remote left frontal stroke, obesity, hyperlipidemia who presented initially on 01/26/2020 with sudden onset aphasia and right hemiparesis while at home.  CT scan of the head on admission in the ED showed old left frontal infarct but CT angiogram was negative for LVO and CT perfusion showed no mismatch.  Symptoms are improving initial NIH stroke scale was 9 and improved to 3.  tPA was not administered due to rapidly improving symptoms.  MRI scan showed left frontal cortical and subcortical infarct adjacent to the old infarct.  MRI also showed old bilateral frontal and left inferior cerebellar infarcts of remote age.  MRA of the brain and neck showed no significant large vessel stenosis or occlusion.  EEG showed no seizure activity.  2D echo showed ejection fraction of 55 to 60%.  TCD bubble studies was positive with a few hits noted only after Valsalva history of her foot reveals clinically insignificant PFO.  TEE also showed a small right-to-left shunt but no cardiac source of embolism.  Lower extremity Dopplers were negative for DVT.  Spinal tap was obtained which showed WBC 3, red cells 10, glucose 72 and protein 44 mg percent and no evidence of infection.  LDL cholesterol is 125 mg percent and hemoglobin A1c was 5.8.  Hypercoagulable panel labs are negative except for slightly elevated anticardiolipin IgM antibody of 14.  ESR and  C-reactive protein B12 are normal.  Patient was started on aspirin and Plavix for 3 weeks followed by Plavix alone.  Tested positive for sleep apnea on the NOx 3 monitor and participated in the sleep smart study and was randomized to the medical treatment.  Patient states she is she was doing well after that but while in [place] she developed again sudden onset of aphasia and right hemiparesis and she was admitted to [facility]  on 09/16/2020 with NIH stroke scale 19.  She received IV tPA and CT angiogram showed left M1 occlusion she underwent mechanical thrombectomy which was successful after second pass with TICI 2b revascularization.  2D echo and TEE were repeated which were unremarkable.  Lower extremity venous Dopplers on 09/13/2020 showed acute DVT in the right external iliac vein.  She was started on Eliquis since then.  She states she started Eliquis well without bruising or bleeding.  She states aphasia is improving though she still has significant hesitancy and struggles to speak and has to speak slowly.  Her comprehension is quite good.  Right-sided strength is improving though she has still weakness in the right grip and intrinsic hand muscles.  She is able to ambulate independently.  Patient has moved recently to [facility] and is now living with her sister.  She has had normal recurrent stroke or TIA symptoms.  ROS:   14 system review of systems is positive for aphasia, speech difficulty, weakness, gait difficulty, sleep apnea and all other systems negative  PMH:  Past Medical History:  Diagnosis  Date   Stroke [facility])     Social History:  Social History   Socioeconomic History   Marital status: Single    Spouse name: Not on file   Number of children: Not on file   Years of education: Not on file   Highest education level: Not on file  Occupational History   Not on file  Tobacco Use   Smoking status: Every Day    Pack years: 0.00    Types:  Cigarettes    Start date: 03/07/2020   Smokeless tobacco: Never   Tobacco comments:    3 cigs per day   Substance and Sexual Activity   Alcohol use: Yes   Drug use: Not on file   Sexual activity: Not on file  Other Topics Concern   Not on file  Social History Narrative   Lives with sister   Left Handed   Drinks 2-3 cups caffeine daily   Social Determinants of Health   Financial Resource Strain: Not on file  Food Insecurity: Not on file  Transportation Needs: Not on file  Physical Activity: Not on file  Stress: Not on file  Social Connections: Not on file  Intimate Partner Violence: Not on file    Medications:   Current Outpatient Medications on File Prior to Visit  Medication Sig Dispense Refill   albuterol (VENTOLIN HFA) 108 (90 Base) MCG/ACT inhaler Inhale 1-2 puffs into the lungs every 6 (six) hours as needed for wheezing or shortness of breath. 8.5 g 1   apixaban (ELIQUIS) 5 MG TABS tablet Take 1 tablet (5 mg total) by mouth 2 (two) times daily. 180 tablet 0   atorvastatin (LIPITOR) 40 MG tablet Take 1 tablet (40 mg total) by mouth daily. 90 tablet 0   levothyroxine (SYNTHROID) 150 MCG tablet Take 1 tablet (150 mcg total) by mouth daily. 90 tablet 0   No current facility-administered medications on file prior to visit.    Allergies:  No Known Allergies  Physical Exam General: Obese middle-aged African-American lady d, seated, in no evident distress Head: head normocephalic and atraumatic.   Neck: supple with no carotid or supraclavicular bruits Cardiovascular: regular rate and rhythm, no murmurs Musculoskeletal: no deformity Skin:  no rash/petichiae Vascular:  Normal pulses all extremities  Neurologic Exam Mental Status: Awake and fully alert. Oriented to place and time. Recent and remote memory intact. Attention span, concentration and fund of knowledge appropriate. Mood and affect appropriate.  Mild expressive aphasia with nonfluent speech and some word finding  difficulties.  Able to name repeat and comprehend well. Cranial Nerves: Fundoscopic exam reveals sharp disc margins. Pupils equal, briskly reactive to light. Extraocular movements full without nystagmus. Visual fields full to confrontation. Hearing intact.  Moderate right lower facial weakness.  Sensation intact. Face, tongue, palate moves normally and symmetrically.  Motor: Normal strength on the left side..  Moderate right lower extremity and mild right upper extremity drift.  Weakness of right grip and intrinsic hand muscles, right hip flexors and ankle dorsiflexors.  Right hemiparesis 4/5 right upper extremity and 3/5 right lower extremity strength.  Tone increased on the right compared to the left. Sensory.: intact to touch , pinprick , position and vibratory sensation.  Coordination: Rapid alternating movements normal in all extremities. Finger-to-nose and heel-to-shin performed accurately bilaterally. Gait and Station: Arises from chair without difficulty. Stance is normal. Gait demonstrates mild spastic ataxic gait and uses a cane.   Reflexes: 2+ and asymmetric and brisker on the right. Toes downgoing.  NIHSS  6 Modified Rankin  3   ASSESSMENT: 45 year old lady with left frontal MCA branch infarct and September 2021 followed by more recent left MCA infarct in May 2022 treated with IV tPA followed by successful mechanical thrombectomy  (at [facility] clinic in [facility] )with excellent clinical outcome.  Patient was also found to have acute DVT in the lower extremities in May 2022.  Vascular risk factors of hyperlipidemia, obesity and sleep apnea.  She was participating in the sleep smart study and was randomized to the medical treatment done but has now met end of study due to recurrent stroke.     PLAN: I had a long d/w patient about her  recent  recurrent cryptogenic strokes, sleep apnoea,risk for recurrent stroke/TIAs, personally independently reviewed imaging studies and stroke  evaluation results and answered questions.Continue Eliquis (apixaban) daily  for her recent DVT and secondary stroke prevention and maintain strict control of hypertension with blood pressure goal below 130/90, diabetes with hemoglobin A1c goal below 6.5% and lipids with LDL cholesterol goal below 70 mg/dL. I also advised the patient to eat a healthy diet with plenty of whole grains, cereals, fruits and vegetables, exercise regularly and maintain ideal body weight .  Referral for outpatient physical occupational and speech therapy.  Continue participation in the medical arm of the sleep smart study for stroke prevention for sleep apnea.  Referred to Dr. Dohmeier/Dr. Rexene Alberts for treatment for his sleep apnea.  Now that she has had recurrent stroke and met the study endpoint.  Followup in the future with me in 6 months or call earlier if necessary.  Greater than 50% time during this 45-minute consultation visit was spent on counseling and coordination of care about her recurrent cryptogenic strokes and sleep apnea and answering questions. Antony Contras, MD  Note: This document was prepared with digital dictation and possible smart phrase technology. Any transcriptional errors that result from this process are unintentional.

## 2020-11-01 NOTE — Patient Instructions (Signed)
I had a long d/w patient about her  recent  recurrent cryptogenic strokes, sleep apnoea,risk for recurrent stroke/TIAs, personally independently reviewed imaging studies and stroke evaluation results and answered questions.Continue Eliquis (apixaban) daily  for her recent DVT and secondary stroke prevention and maintain strict control of hypertension with blood pressure goal below 130/90, diabetes with hemoglobin A1c goal below 6.5% and lipids with LDL cholesterol goal below 70 mg/dL. I also advised the patient to eat a healthy diet with plenty of whole grains, cereals, fruits and vegetables, exercise regularly and maintain ideal body weight .  Continue participation in the medical arm of the sleep smart study for stroke prevention for sleep apnea.  Referred to Dr. Dohmeier/Dr. Rexene Alberts for treatment for his sleep apnea.  Now that she has had recurrent stroke and met the study endpoint.  Followup in the future with me in 6 months or call earlier if necessary. Stroke Prevention Some medical conditions and behaviors are associated with a higher chance of having a stroke. You can help prevent a stroke by making nutrition, lifestyle,and other changes, including managing any medical conditions you may have. What nutrition changes can be made?  Eat healthy foods. You can do this by: Choosing foods high in fiber, such as fresh fruits and vegetables and whole grains. Eating at least 5 or more servings of fruits and vegetables a day. Try to fill half of your plate at each meal with fruits and vegetables. Choosing lean protein foods, such as lean cuts of meat, poultry without skin, fish, tofu, beans, and nuts. Eating low-fat dairy products. Avoiding foods that are high in salt (sodium). This can help lower blood pressure. Avoiding foods that have saturated fat, trans fat, and cholesterol. This can help prevent high cholesterol. Avoiding processed and premade foods. Follow your health care provider's specific  guidelines for losing weight, controlling high blood pressure (hypertension), lowering high cholesterol, and managing diabetes. These may include: Reducing your daily calorie intake. Limiting your daily sodium intake to 1,500 milligrams (mg). Using only healthy fats for cooking, such as olive oil, canola oil, or sunflower oil. Counting your daily carbohydrate intake. What lifestyle changes can be made? Maintain a healthy weight. Talk to your health care provider about your ideal weight. Get at least 30 minutes of moderate physical activity at least 5 days a week. Moderate activity includes brisk walking, biking, and swimming. Do not use any products that contain nicotine or tobacco, such as cigarettes and e-cigarettes. If you need help quitting, ask your health care provider. It may also be helpful to avoid exposure to secondhand smoke. Limit alcohol intake to no more than 1 drink a day for nonpregnant women and 2 drinks a day for men. One drink equals 12 oz of beer, 5 oz of wine, or 1 oz of hard liquor. Stop any illegal drug use. Avoid taking birth control pills. Talk to your health care provider about the risks of taking birth control pills if: You are over 35 years old. You smoke. You get migraines. You have ever had a blood clot. What other changes can be made? Manage your cholesterol levels. Eating a healthy diet is important for preventing high cholesterol. If cholesterol cannot be managed through diet alone, you may also need to take medicines. Take any prescribed medicines to control your cholesterol as told by your health care provider. Manage your diabetes. Eating a healthy diet and exercising regularly are important parts of managing your blood sugar. If your blood sugar cannot be managed  through diet and exercise, you may need to take medicines. Take any prescribed medicines to control your diabetes as told by your health care provider. Control your hypertension. To reduce your  risk of stroke, try to keep your blood pressure below 130/80. Eating a healthy diet and exercising regularly are an important part of controlling your blood pressure. If your blood pressure cannot be managed through diet and exercise, you may need to take medicines. Take any prescribed medicines to control hypertension as told by your health care provider. Ask your health care provider if you should monitor your blood pressure at home. Have your blood pressure checked every year, even if your blood pressure is normal. Blood pressure increases with age and some medical conditions. Get evaluated for sleep disorders (sleep apnea). Talk to your health care provider about getting a sleep evaluation if you snore a lot or have excessive sleepiness. Take over-the-counter and prescription medicines only as told by your health care provider. Aspirin or blood thinners (antiplatelets or anticoagulants) may be recommended to reduce your risk of forming blood clots that can lead to stroke. Make sure that any other medical conditions you have, such as atrial fibrillation or atherosclerosis, are managed. What are the warning signs of a stroke? The warning signs of a stroke can be easily remembered as BEFAST. B is for balance. Signs include: Dizziness. Loss of balance or coordination. Sudden trouble walking. E is for eyes. Signs include: A sudden change in vision. Trouble seeing. F is for face. Signs include: Sudden weakness or numbness of the face. The face or eyelid drooping to one side. A is for arms. Signs include: Sudden weakness or numbness of the arm, usually on one side of the body. S is for speech. Signs include: Trouble speaking (aphasia). Trouble understanding. T is for time. These symptoms may represent a serious problem that is an emergency. Do not wait to see if the symptoms will go away. Get medical help right away. Call your local emergency services (911 in the [place] Do not drive yourself  to the hospital. Other signs of stroke may include: A sudden, severe headache with no known cause. Nausea or vomiting. Seizure. Where to find more information For more information, visit: American Stroke Association: www.strokeassociation.org National Stroke Association: www.stroke.org Summary You can prevent a stroke by eating healthy, exercising, not smoking, limiting alcohol intake, and managing any medical conditions you may have. Do not use any products that contain nicotine or tobacco, such as cigarettes and e-cigarettes. If you need help quitting, ask your health care provider. It may also be helpful to avoid exposure to secondhand smoke. Remember BEFAST for warning signs of stroke. Get help right away if you or a loved one has any of these signs. This information is not intended to replace advice given to you by your health care provider. Make sure you discuss any questions you have with your healthcare provider. Document Revised: 04/05/2017 Document Reviewed: 05/29/2016 Elsevier Patient Education  2021 [employer].

## 2020-11-08 ENCOUNTER — Encounter: Payer: Self-pay | Admitting: *Deleted

## 2020-11-15 ENCOUNTER — Other Ambulatory Visit: Payer: Self-pay | Admitting: Student

## 2020-11-15 DIAGNOSIS — F17200 Nicotine dependence, unspecified, uncomplicated: Secondary | ICD-10-CM

## 2020-11-15 MED ORDER — ALBUTEROL SULFATE HFA 108 (90 BASE) MCG/ACT IN AERS: 1.0000 | INHALATION_SPRAY | Freq: Four times a day (QID) | RESPIRATORY_TRACT | 3 refills | Status: DC | PRN

## 2020-11-15 NOTE — Telephone Encounter (Signed)
Called pt - stated she wants rx sent to [facility] on [address] which is closer to her home. Previously, she had been staying with her sister in [place].

## 2020-11-15 NOTE — Telephone Encounter (Signed)
Refill Request   albuterol (VENTOLIN HFA) 108 (90 Base) MCG/ACT inhaler  Please send it to :  [facility] Address: [address] [place], [place] [address]  Phone: [phone]

## 2020-11-21 ENCOUNTER — Other Ambulatory Visit: Payer: Self-pay

## 2020-11-21 ENCOUNTER — Ambulatory Visit: Payer: Medicaid Other | Attending: Neurology | Admitting: Occupational Therapy

## 2020-11-21 ENCOUNTER — Ambulatory Visit: Payer: Medicaid Other | Admitting: Physical Therapy

## 2020-11-21 ENCOUNTER — Encounter: Payer: Self-pay | Admitting: Occupational Therapy

## 2020-11-21 ENCOUNTER — Ambulatory Visit: Payer: Medicaid Other | Admitting: Speech Pathology

## 2020-11-21 ENCOUNTER — Encounter: Payer: Self-pay | Admitting: Speech Pathology

## 2020-11-21 DIAGNOSIS — R278 Other lack of coordination: Secondary | ICD-10-CM | POA: Insufficient documentation

## 2020-11-21 DIAGNOSIS — R4701 Aphasia: Secondary | ICD-10-CM | POA: Diagnosis present

## 2020-11-21 DIAGNOSIS — I69319 Unspecified symptoms and signs involving cognitive functions following cerebral infarction: Secondary | ICD-10-CM | POA: Insufficient documentation

## 2020-11-21 DIAGNOSIS — R2681 Unsteadiness on feet: Secondary | ICD-10-CM | POA: Diagnosis present

## 2020-11-21 DIAGNOSIS — M6281 Muscle weakness (generalized): Secondary | ICD-10-CM

## 2020-11-21 DIAGNOSIS — R41842 Visuospatial deficit: Secondary | ICD-10-CM | POA: Diagnosis present

## 2020-11-21 DIAGNOSIS — R41844 Frontal lobe and executive function deficit: Secondary | ICD-10-CM | POA: Insufficient documentation

## 2020-11-21 DIAGNOSIS — I69353 Hemiplegia and hemiparesis following cerebral infarction affecting right non-dominant side: Secondary | ICD-10-CM

## 2020-11-21 DIAGNOSIS — R41840 Attention and concentration deficit: Secondary | ICD-10-CM | POA: Diagnosis present

## 2020-11-21 DIAGNOSIS — R2689 Other abnormalities of gait and mobility: Secondary | ICD-10-CM | POA: Insufficient documentation

## 2020-11-21 DIAGNOSIS — M25611 Stiffness of right shoulder, not elsewhere classified: Secondary | ICD-10-CM | POA: Diagnosis present

## 2020-11-21 NOTE — Therapy (Signed)
[facility] [address] [place], [place], [address] Phone: [phone]   Fax:  [phone]  Speech Language Pathology Evaluation  Patient Details  Name: Maria Porter MRN: [id number] Date of Birth: 03/08/1976 Referring Provider (SLP): Dr. Delia Heady   Encounter Date: 11/21/2020   End of Session - 11/21/20 1304     Visit Number 1    Number of Visits 10    Date for SLP Re-Evaluation 02/13/21    Authorization Type medicaid    SLP Start Time 1150    SLP Stop Time  1230    SLP Time Calculation (min) 40 min    Activity Tolerance Patient tolerated treatment well             Past Medical History:  Diagnosis Date   Stroke [facility])     Past Surgical History:  Procedure Laterality Date   BUBBLE STUDY  01/29/2020   Procedure: BUBBLE STUDY;  Surgeon: Little Ishikawa, MD;  Location: [facility] ENDOSCOPY;  Service: Cardiovascular;;   TEE WITHOUT CARDIOVERSION N/A 01/29/2020   Procedure: TRANSESOPHAGEAL ECHOCARDIOGRAM (TEE);  Surgeon: Little Ishikawa, MD;  Location: [facility] ENDOSCOPY;  Service: Cardiovascular;  Laterality: N/A;    There were no vitals filed for this visit.   Subjective Assessment - 11/21/20 1156     Subjective "I can't say the words I want to say"                SLP Evaluation [facility] - 11/21/20 1249       SLP Visit Information   SLP Received On 11/21/20    Referring Provider (SLP) Dr. Delia Heady    Onset Date 09/11/2020    Medical Diagnosis Left CVA      Subjective   Patient/Family Stated Goal "To get  back to work and live independently"      Pain Assessment   Currently in Pain? No/denies      General Information   HPI Maria Porter developed  sudden onset of aphasia and right hemiparesis and she was admitted to [facility]  on 09/16/2020 with NIH stroke scale 19.  She received IV tPA and CT angiogram showed left M1 occlusion she underwent mechanical  thrombectomy which was successful after second pass with TICI 2b revascularization. 2 prior left CVA's, most recent 01/26/20. 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    Mobility Status walks independently - PT eval today      Balance Screen   Has the patient fallen in the past 6 months No      Prior Functional Status   Cognitive/Linguistic Baseline Within functional limits    Type of Home Apartment     Lives With Family    Available Support Family    Vocation Full time employment   prior to CVA     Cognition   Overall Cognitive Status Impaired/Different from baseline    Area of Impairment Attention;Memory;Problem Actuary Comprehension   Overall Auditory Comprehension Appears within functional limits for tasks assessed    Yes/No Questions Within Functional Limits    Conversation Simple      Visual Recognition/Discrimination   Discrimination Not tested      Reading Comprehension   Reading Status Within funtional limits      Verbal Expression   Overall Verbal Expression Impaired  Initiation Impaired    Level of Generative/Spontaneous Verbalization Conversation    Repetition Impaired    Level of Impairment Phrase level    Naming Impairment    Responsive Not tested    Confrontation 75-100% accurate    Convergent Not tested    Divergent 25-49% accurate    Verbal Errors Phonemic paraphasias    Pragmatics No impairment    Effective Techniques Phonemic cues;Sentence completion      Written Expression   Dominant Hand Left    Written Expression Exceptions to [facility]    Dictation Ability Phrase    Overall Writen Expression aphasic errors at sentence level, she was unable to correct them and gave up      Oral Motor/Sensory Function   Overall Oral Motor/Sensory Function Impaired    Labial ROM Reduced right    Labial Symmetry Abnormal symmetry  right    Labial Strength Reduced Right    Labial Sensation Reduced Right    Labial Coordination Reduced    Lingual ROM Reduced right    Lingual Symmetry Within Functional Limits    Lingual Strength Reduced Right    Lingual Coordination Reduced    Facial ROM Within Functional Limits    Velum Within Functional Limits      Motor Speech   Overall Motor Speech Impaired    Respiration Within functional limits    Phonation Normal    Resonance Within functional limits    Articulation Impaired    Level of Impairment Sentence    Intelligibility Intelligibility reduced    Word 75-100% accurate    Phrase 75-100% accurate    Sentence 75-100% accurate    Conversation 75-100% accurate   requested repetition 2x   Motor Planning Impaired    Level of Impairment Phrase    Motor Speech Errors Groping for words;Aware;Inconsistent    Effective Techniques Slow rate;Pause      Standardized Assessments   Standardized Assessments  --   Quick Aphasia Battery - 8.39/10 Mild aphasia                            SLP Education - 11/21/20 1303     Education Details HEP for verbal apraxia; environmental compensations for aphasia    Person(s) Educated Patient    Methods Explanation;Verbal cues;Handout    Comprehension Verbalized understanding;Verbal cues required;Need further instruction              SLP Short Term Goals - 11/21/20 1602       SLP SHORT TERM GOAL #1   Title In structured language tasks, pt will use verbal and/or non verbal compensations for word finding episodes with occasional min A    Baseline no strategies, just giving up    Time 4    Period Weeks    Status New      SLP SHORT TERM GOAL #2   Title Pt will carryover compensations for verbal apraxia to be 90% intellgible in complex conversation over 10 minutes with rare min A    Baseline uses compensations inconsistently resulting in reduced intellgibility    Time 4    Period Weeks    Status New      SLP  SHORT TERM GOAL #3   Title Pt will use compensatory strategies to complete 3 business/community interactions or phone calls with occasional min A    Baseline sister is handling all business/community interactions for her    Time 4    Period  Weeks    Status New      SLP SHORT TERM GOAL #4   Title Pt will ID and self correct written errors at sentence level 18/20 sentences with rare min A    Baseline ID's errors, but unable to self correct    Time 4    Period Weeks    Status New              SLP Long Term Goals - 11/21/20 1607       SLP LONG TERM GOAL #1   Title Pt will carryover verbal and non verbal compensations for aphasia as needed over 18 minute moderately complex conversation with rare min A    Time 9    Period Weeks    Status New      SLP LONG TERM GOAL #2   Title Pt will name 15 items in personally relevant categories with occasional min A over 2 sessions    Baseline 5 items in simple category    Time 9    Period Weeks    Status New      SLP LONG TERM GOAL #3   Title Pt will use external aids and compensatory strategies to report avoiding communicating with friends and community members less than 3x a week    Baseline she's avoiding friends and community/business interactions    Time 9    Period Weeks    Status New      SLP LONG TERM GOAL #4   Title Pt will ID and self correct errors in simple 3 -4 sentence written paragraph with occasional min A    Time 9    Period Weeks    Status New      SLP LONG TERM GOAL #5   Title Pt will carryover compensations for verbal apraxia to be 95% intellgible over 15 minute conversation in mildly noisy environment    Baseline inconsistently carrying over compensations at sentence level    Time 9    Period Weeks    Status New      Additional Long Term Goals   Additional Long Term Goals Yes      SLP LONG TERM GOAL #6   Title Improve score of The Communicative Participation Item Bank - General Short form by 4 points     Baseline score of 8/30    Time 9    Period Weeks    Status New              Plan - 11/21/20 1305     Clinical Impression Statement Ms. Novella OliveDanielle Derousse is referred for outpt ST by her neurologist due to aphasia and speech difficulties after Left CVA. 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 Auditory and reading comprehension are relatively intact. She endorses significant pausing for word finding and denied using any compensatory strategies for word finding but becomes "frustrated." Duwayne HeckDanielle is avoiding talking to people outside of her immediate family and her sister is doing her business communication, incluidng to her land lord. Rapid alternating speech with irregular rate, halting and groping. Duwayne HeckDanielle is observed to have halting and groping on multisyllabic words as well. She compensates with slowing her rate of speech, but she reports she forgets to do this frequently resulting in poor intelligilbity and frustration.  4 aphasic errors noted in phrase and sentence writing, which she ID'd but was unable to correct. She named 5 animals in 1 minute (20 is WNL) and 3 "M" words in 1 minute.  She scored an 8/30 on the Communicative Participation Item Bank with "very much" difficulty with communicating something quickly, getting a turn in a fast moving conversation and trying to persuade a family or friend. She rated "quite a bit" of difficulty talking with people she knows, talking with strangers, communicating in the community, asking questions and giving detailed infor,mation. Makenzee became tearful due to not being able to work as a Engineer, civil (consulting) at this time. I recommend skilled ST to maximize communication for improved life participation, reducing avoidance behaviors and posssible return to work in some  capacity.    Speech Therapy Frequency 1x /week    Duration --   9 weeks   Treatment/Interventions Language facilitation;Environmental controls;Cueing hierarchy;Oral motor exercises;SLP instruction and feedback;Compensatory strategies;Functional tasks;Cognitive reorganization;Compensatory techniques;Patient/family education;Multimodal communcation approach;Internal/external aids    Potential to Achieve Goals Good    SLP Home Exercise Plan read aloud 10 minutes twice a day for verbal apraxia             Patient will benefit from skilled therapeutic intervention in order to improve the following deficits and impairments:   Aphasia  Verbal aphasia    Problem List Patient Active Problem List   Diagnosis Date Noted   Acute DVT (deep venous thrombosis) (HCC) 10/06/2020   Elevated liver function tests 02/05/2020   Pre-diabetes 02/03/2020   Subclinical hypothyroidism 02/03/2020   Tobacco use disorder 01/27/2020   Hyperlipemia 01/27/2020   Obesity 01/27/2020   History of CVA with residual deficit 01/26/2020    Ayani Ospina, Radene Journey MS, CCC-SLP 11/21/2020, 4:12 PM  [facility] [address] [place], [place], [address] Phone: [phone]   Fax:  [phone]  Name: Lakshmi Sundeen MRN: [id number] Date of Birth: [birth year]

## 2020-11-21 NOTE — Therapy (Signed)
[facility] [address] [place], [place], [address] Phone: [phone]   Fax:  [phone]  Physical Therapy Evaluation  Patient Details  Name: Maria Porter MRN: [id number] Date of Birth: 1976/03/09 Referring Provider (PT): Micki RileySethi, Pramod S, MD   Encounter Date: 11/21/2020   PT End of Session - 11/21/20 1100     Visit Number 1    Number of Visits 12    Date for PT Re-Evaluation 01/02/21    Authorization Type [employer] Medicaid    PT Start Time 1020    PT Stop Time 1100    PT Time Calculation (min) 40 min    Equipment Utilized During Treatment Gait belt    Activity Tolerance Patient tolerated treatment well    Behavior During Therapy [facility] for tasks assessed/performed             Past Medical History:  Diagnosis Date   Stroke [facility](HCC)     Past Surgical History:  Procedure Laterality Date   BUBBLE STUDY  01/29/2020   Procedure: BUBBLE STUDY;  Surgeon: Little IshikawaSchumann, Christopher L, MD;  [facility];  Service: Cardiovascular;;   TEE WITHOUT CARDIOVERSION N/A 01/29/2020   Procedure: TRANSESOPHAGEAL ECHOCARDIOGRAM (TEE);  Surgeon: Little IshikawaSchumann, Christopher L, MD;  [facility];  Service: Cardiovascular;  Laterality: N/A;    There were no vitals filed for this visit.    Subjective Assessment - 11/21/20 1021     Subjective Pt reports she's had 3 strokes. Last stroke on Mother's day. Pt notes her speech has been getting better. Right hand and leg are weak. Pt lives with her sister. Bending to pick things up makes her lose her balance. Pt tries to work out 30 minutes a day. Pt does note difference in her vision since the CVA but her sensation feels okay. Pt reports she walks the treadmill ~1 hr every day.    Pertinent History prior CVA, hypothyroidism, elevated BMI    How long can you sit comfortably? no issues    How long can you stand comfortably? No issues    How long can you walk comfortably? 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 No/denies                [facility] PT Assessment - 11/21/20 0001       Assessment   Medical Diagnosis I63.9 (ICD-10-CM) - Cryptogenic stroke (HCC) Acute CVA in the left MCA territory with left MCA occlusion  I69.320 (ICD-10-CM) - Aphasia as late effect of stroke  G81.12 (ICD-10-CM) - Spastic hemiplegia of left dominant side due to noncerebrovascular etiology [facility](HCC)    Referring Provider (PT) Micki RileySethi, Pramod S, MD    Hand Dominance Left    Prior Therapy None -- did her own rehab after her first stroke (did not have insurance at the time)      Precautions   Precautions Fall      Restrictions   Weight Bearing Restrictions No      Balance Screen   Has the patient fallen in the past 6 months No      Home Environment   Living Environment Private residence    Living Arrangements Other relatives   Sister   Available Help at  Discharge Family    Type of Home House    Home Access Stairs to enter    Entrance Stairs-Number of Steps 3    Entrance Stairs-Rails Can reach both    Home Layout One level    Home Equipment Abrams - single point;Walker - 2 wheels    Additional Comments Uses RW for her daughter's house because it's big      Prior Function   Level of Independence Independent    Vocation Requirements 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, 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    Leisure read      [employer]   Overall Cognitive Status --   See SLP and OT notes     Observation/Other Assessments   Focus on Therapeutic Outcomes (FOTO)  --      Sensation   Additional Comments Reports no gross sensory issues in R LE      ROM / Strength   AROM / PROM / Strength Strength      Strength   Overall Strength Comments L LE grossly 5/5 with testing    Strength Assessment Site  Hip;Knee;Ankle    Right/Left Hip Right    Right Hip Flexion 3+/5    Right Hip Extension 2+/5    Right Hip ABduction 2/5    Right/Left Knee Right    Right Knee Flexion 3+/5    Right Knee Extension 3+/5    Right/Left Ankle Right    Right Ankle Dorsiflexion 3-/5    Right Ankle Plantar Flexion 3/5      Transfers   Transfers Sit to Stand;Stand to Sit;Supine to Sit    Sit to Stand 5: Supervision    Sit to Stand Details (indicate cue type and reason) Increased L LE weight shift; needs use of UEs for safety    Five time sit to stand comments  29 sec    Stand to Sit 5: Supervision    Stand to Sit Details Increased L LE weight shift; needs use of UEs for safety    Supine to Sit 7: Independent      Ambulation/Gait   Ambulation Distance (Feet) 330 Feet    Assistive device Straight cane    Gait Pattern Step-through pattern;Decreased weight shift to right;Right genu recurvatum    Ambulation Surface Level;Indoor      Standardized Balance Assessment   Standardized Balance Assessment Berg Balance Test;Timed Up and Go Test;Dynamic Gait Index      Berg Balance Test   Sit to Stand Able to stand  independently using hands    Standing Unsupported Able to stand safely 2 minutes    Sitting with Back Unsupported but Feet Supported on Floor or Stool Able to sit safely and securely 2 minutes    Stand to Sit Controls descent by using hands    Transfers Able to transfer safely, definite need of hands    Standing Unsupported with Eyes Closed Able to stand 10 seconds with supervision    Standing Unsupported with Feet Together Able to place feet together independently but unable to hold for 30 seconds    From Standing, Reach Forward with Outstretched Arm Can reach forward >12 cm safely (5")    From Standing Position, Pick up Object from Floor Unable to pick up and needs supervision    From Standing Position, Turn to Look Behind Over each Shoulder Turn sideways only but maintains balance    Turn 360 Degrees  Able to turn 360 degrees safely but slowly  Standing Unsupported, Alternately Place Feet on Step/Stool Able to stand independently and complete 8 steps >20 seconds    Standing Unsupported, One Foot in Front Able to take small step independently and hold 30 seconds    Standing on One Leg Able to lift leg independently and hold equal to or more than 3 seconds    Total Score 37    Berg comment: 37/56      Dynamic Gait Index   Level Surface Mild Impairment    Change in Gait Speed Mild Impairment    Gait with Horizontal Head Turns Normal    Gait with Vertical Head Turns Normal    Gait and Pivot Turn Normal    Step Over Obstacle Mild Impairment    Step Around Obstacles Normal    Steps Moderate Impairment    Total Score 19    DGI comment: 19/24      Timed Up and Go Test   Normal TUG (seconds) 25                        Objective measurements completed on examination: See above findings.               PT Education - 11/21/20 1311     Education Details Exam findings, POC    Person(s) Educated Patient    Methods Explanation;Verbal cues;Demonstration;Tactile cues;Handout    Comprehension Verbalized understanding;Returned demonstration;Verbal cues required;Tactile cues required              PT Short Term Goals - 11/21/20 1455       PT SHORT TERM GOAL #1   Title Pt will be independent with strengthening HEP    Time 4    Period Weeks    Status New    Target Date 12/19/20      PT SHORT TERM GOAL #2   Title Pt will have improved 5x STS to </=20 sec    Baseline 29 sec    Time 4    Period Weeks    Status New    Target Date 12/19/20      PT SHORT TERM GOAL #3   Title Pt will improve TUG to </=20 sec    Baseline 25 sec    Time 4    Period Weeks    Status New    Target Date 12/19/20      PT SHORT TERM GOAL #4   Title Pt will have improved Berg Balance Score to at least 43/56 to demo MCID    Baseline 37/56    Time 4    Period Weeks    Status  New    Target Date 12/19/20               PT Long Term Goals - 11/21/20 1500       PT LONG TERM GOAL #1   Title Pt will be independent with self progressing her strengthening HEP    Time 9    Period Weeks    Status New    Target Date 01/23/21      PT LONG TERM GOAL #2   Title Pt will have improved 5x STS </=13 sec to demo low fall risk    Time 9    Period Weeks    Status New    Target Date 01/23/21      PT LONG TERM GOAL #3   Title Pt will have improved TUG score of </=15 sec to demo low  fall risk with no A/D    Time 9    Period Weeks    Status New    Target Date 01/23/21      PT LONG TERM GOAL #4   Title Pt will have improved Berg Balance Score of at least 48/56 to demo low fall risk    Time 9    Period Weeks    Status New    Target Date 01/23/21      PT LONG TERM GOAL #5   Title Pt will be able to amb without a/d for >1000' indoors and outdoors to demo improved community mobility    Time 9    Period Weeks    Status New    Target Date 01/23/21                    Plan - 11/21/20 1311     Clinical Impression Statement [name] is a 46 y/o F presenting to OPPT s/p L MCA CVA. On assessment, pt demos decreased R LE strength, poor static balance with fair dynamic balance while using SPC. At this time, pt's Berg Balance, TUG, and 5x STS all indicate a high fall risk. Pt with goals for return to work as a Engineer, civil (consulting) and not have to use a/d. Pt would benefit from PT to optimize her mobility for return to work tasks and increase independence at home.    Personal Factors and Comorbidities Age;Fitness;Comorbidity 1;Time since onset of injury/illness/exacerbation    Comorbidities prior CVA, hypothyroidism, elevated BMI    Examination-Activity Limitations Locomotion Level;Transfers;Stand;Stairs;Squat;Carry;Lift;Bend    Examination-Participation Restrictions Cleaning;Occupation;Community Activity;Shop;Yard Work    Conservation officer, historic buildings  Evolving/Moderate complexity    Clinical Decision Making Moderate    Rehab Potential Good    PT Frequency 1x / week    PT Duration Other (comment)   9 wks   PT Treatment/Interventions ADLs/Self Care Home Management;Aquatic Therapy;Moist Heat;Cryotherapy;DME Instruction;Gait training;Stair training;Functional mobility training;Therapeutic activities;Therapeutic exercise;Balance training;Neuromuscular re-education;Manual techniques;Patient/family education;Passive range of motion;Energy conservation;Taping    PT Next Visit Plan Continue to work on R LE strengthening, weightbearing/shifting, and static balance. Create HEP accordingly.    PT Home Exercise Plan Access Code [email or web address]    Consulted and Agree with Plan of Care Patient             Patient will benefit from skilled therapeutic intervention in order to improve the following deficits and impairments:  Difficulty walking, Decreased activity tolerance, Decreased balance, Decreased mobility, Decreased strength, Decreased cognition  Visit Diagnosis: Muscle weakness (generalized)  Unsteadiness on feet  Other abnormalities of gait and mobility  Hemiplegia and hemiparesis following cerebral infarction affecting right non-dominant side [facility])     Problem List Patient Active Problem List   Diagnosis Date Noted   Acute DVT (deep venous thrombosis) (HCC) 10/06/2020   Elevated liver function tests 02/05/2020   Pre-diabetes 02/03/2020   Subclinical hypothyroidism 02/03/2020   Tobacco use disorder 01/27/2020   Hyperlipemia 01/27/2020   Obesity 01/27/2020   History of CVA with residual deficit 01/26/2020    Check all possible CPT codes: 54098- Therapeutic Exercise, 97112- Neuro Re-education, [phone] - Gait Training, 97140 - Manual Therapy, 97530 - Therapeutic Activities, [phone] - Self Care, [phone] - Electrical stimulation (unattended), [phone] - Orthotic Fit, and [phone] - Aquatic therapy         Petrea Fredenburg April Ma L Lyndon Chapel PT, DPT 11/21/2020,  3:04 PM  [facility] [address] [place], [place], [address] Phone:  [phone]   Fax:  [phone]  Name: Maria Porter MRN: [id number] Date of Birth: 15-Oct-1975

## 2020-11-21 NOTE — Patient Instructions (Signed)
   Aphasia  Verbal Apraxia  Read aloud 10 minutes twice a day - get large print books or Genevie Cheshire  Use a bookmark or index card to help you eyes stay on the right line  Get the persons attention before you speak  Use eye contact and face the person you are speaking to  Be in close proximity to the person you are speaking to  Turn down any noise in the environment such as the TV, walk away from loud appliances, air conditioners, fans, dish washers etc  Group conversations may be harder to process and follow and it may be harder for you to participate  Speech/language can get worse when you are fatigued and when you are upset, excited, stressed or frustrated  Have important conversations earlier in the day

## 2020-11-21 NOTE — Therapy (Signed)
[facility] [address] [place], [place], [address] Phone: [phone]   Fax:  [phone]  Occupational Therapy Evaluation  Patient Details  Name: Novella OliveDanielle Lacomb MRN: [id number] Date of Birth: [birth year] Referring Provider (OT): Dr. Delia HeadyPramod Sethi   Encounter Date: 11/21/2020   OT End of Session - 11/21/20 1445     Visit Number 1    Number of Visits 10    Date for OT Re-Evaluation 01/30/21    Authorization Type [employer] Medicaid    OT Start Time 1106    OT Stop Time 1147    OT Time Calculation (min) 41 min    Activity Tolerance Patient tolerated treatment well    Behavior During Therapy [facility] for tasks assessed/performed             Past Medical History:  Diagnosis Date   Stroke [facility](HCC)     Past Surgical History:  Procedure Laterality Date   BUBBLE STUDY  01/29/2020   Procedure: BUBBLE STUDY;  Surgeon: Little IshikawaSchumann, Christopher L, MD;  [facility];  Service: Cardiovascular;;   TEE WITHOUT CARDIOVERSION N/A 01/29/2020   Procedure: TRANSESOPHAGEAL ECHOCARDIOGRAM (TEE);  Surgeon: Little IshikawaSchumann, Christopher L, MD;  [facility];  Service: Cardiovascular;  Laterality: N/A;    There were no vitals filed for this visit.   Subjective Assessment - 11/21/20 1115     Subjective  I want to go back to work.  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  Pt reports that she has had no prior therapy.    Pertinent History CVA      PMH:  hx of prior CVA (approx 4 yrs ago affecting L side, 01/2020 affecting R side, and 09/11/20 affecting R side); obesity, hyperlipidemia, sleep apnea    Patient Stated Goals go back to work, get back like I was, improve ability to do crafts    Currently in Pain? No/denies               [facility] OT Assessment - 11/21/20 1115       Assessment   Medical Diagnosis I63.9 (ICD-10-CM) - Cryptogenic stroke (HCC) Acute CVA in the left MCA  territory with left MCA occlusion  I69.320 (ICD-10-CM) - Aphasia as late effect of stroke    Referring Provider (OT) Dr. Delia HeadyPramod Sethi    Onset Date/Surgical Date --   latest CVA 09/11/20, previous 01/2020, and additional prior CVA approx 4 yrs ago   Hand Dominance Left    Prior Therapy None after first 2 CVAs (did not have insurance at the time)      Precautions   Precautions Fall      Balance Screen   Has the patient fallen in the past 6 months No      Home  Environment   Family/patient expects to be discharged to: Private residence    Lives With --   sister, and goes to dtr's house frequently as well (who lives in [place])     Prior Function   Level of Independence Independent    Vocation On disability   since March   Vocation Requirements Wants to return work as a Engineer, civil (consulting)nurse - last at work Sept 2021    Leisure like to work, Chief Executive Officerlatch hook rugs, read      ADL   Eating/Feeding Modified independent    Grooming Modified independent   gets hair done, unable to style in between   S[employer] AutomotiveUpper Body  Bathing Modified independent    Lower Body Bathing Modified independent    Upper Body Dressing Increased time   for fasteners   Lower Body Dressing Modified independent;Increased time    Toilet Transfer Modified independent   difficulty   Toileting - Clothing Manipulation Modified independent;Increased time    Toileting -  Therapist, nutritional Walk in shower   not at sisters     IADL   Shopping --   goes with sister, uses scooter if available   Light Housekeeping --   vacuumn and performs light tasks, sister does heavier tasks   Meal Prep --   sister does most, pt performs simple stovetop tasks, snack/sandwich prep, warms items in microwave.  Sister lifts heavier tasks and chops.  Difficulty opening bottles.   Prior Level of Function Community Mobility independent    Community Mobility Relies on family or friends for  transportation    Medication Management Is responsible for taking medication in correct dosages at correct time    Financial Management Requires supervision/minimal cuing   at times     Mobility   Mobility Status Comments Pt ambulates with walking stick in community and outside, but typically does not use in the home      Written Expression   Dominant Hand Left    Handwriting --   pt denies difficulty     Vision Assessment   Vision Assessment Vision impaired  _ to be further tested in functional context   prn   Ocular Range of Motion Within Functional Limits    Tracking/Visual Pursuits Able to track stimulus in all quads without difficulty    Saccades --   overshoots inconsistently on R side   Convergence Within functional limits    Visual Fields No apparent deficits    Comment Recommended pt schedule eye doctor visit.  Pt reports R side "spark" of light intermittently and L side blurry intermittently.  Pt reports vision fluctuates      Cognition   Overall Cognitive Status Impaired/Different from baseline   cognition to be assessed further in functional context prn   Area of Impairment Attention;Memory;Problem solving   per pt report   Psychologist, occupational Comments pt also with difficulty with speech      Sensation   Stereognosis Impaired by gross assessment   in LUE per pt     Coordination   9 Hole Peg Test Right;Left    Right 9 Hole Peg Test 48.34    Left 9 Hole Peg Test 21.37    Box and Blocks R-24blocks, L-45blocks      ROM / Strength   AROM / PROM / Strength AROM;Strength;PROM      AROM   Overall AROM  Deficits    Overall AROM Comments R shoulder flex 70*, abduction 70*, ER/IR 50%, elbow flex 110*.      PROM   Overall PROM  Deficits    Overall PROM Comments decr R shoulder movement approx 50-75% grossly and approx 75% elbow flex      Strength   Overall Strength Deficits    Overall Strength Comments LUE proximal strength grossly 5/5, RUE  proximal strength limited to above AROM      Hand Function   Right Hand Grip (lbs) 12.7    Left Hand Grip (lbs) 58.8  OT Education - 11/21/20 1443     Education Details OT eval results, POC/Medicaid visit limitations (pt wishes to split visits equally between OT, PT, and ST)    Person(s) Educated Patient    Methods Explanation    Comprehension Verbalized understanding              OT Short Term Goals - 11/21/20 1455       OT SHORT TERM GOAL #1   Title Pt will be independent with initial HEP.--check STGs 12/22/20    Baseline no HEP    Time 4    Period Weeks    Status New      OT SHORT TERM GOAL #2   Title Pt will demo at least 85* R shoulder flex for functional reach.    Baseline 70*    Time 4    Period Weeks    Status New      OT SHORT TERM GOAL #3   Title Pt will improve R hand coordination for ADLs/IADLs as shown by improving score on box and blocks test by at least 10 blocks.    Baseline 24 blocks    Time 4    Period Weeks    Status New      OT SHORT TERM GOAL #4   Title Pt will improve R hand grip strength to at least 20lbs to assist in opening bottles/containers.    Baseline 12.7lbs    Time 4    Period Weeks    Status New               OT Long Term Goals - 11/21/20 1457       OT LONG TERM GOAL #1   Title Pt will verbalize understanding of AE/strategies for incr safety/independence with ADLs/IADLs (including cognitive and visual strategies prn).--check LTGs 01/30/21    Baseline dependent, needs education    Time 9    Period Weeks    Status New      OT LONG TERM GOAL #2   Title Pt will demo at least 100* R shoulder flex for functional reach.    Baseline 70*    Time 9    Period Weeks    Status New      OT LONG TERM GOAL #3   Title Pt will improve R hand coordination for ADLs/IADLs and leisure tasks as shown by completing 9-hole peg test in 38sec or less.    Baseline 48.34sec    Time 9     Period Weeks    Status New      OT LONG TERM GOAL #4   Title Pt will improve R hand grip strength to at least 25lbs to assist in opening bottles/containers.    Baseline 12.7lbs    Time 9    Period Weeks    Status New      OT LONG TERM GOAL #5   Title Pt will be independent with updated HEP.    Baseline dependent, no HEP    Time 9    Period Weeks    Status New                   Plan - 11/21/20 1448     Clinical Impression Statement Pt is a 45 y.o. female referred to occupational therapy for CVA.  Pt has history of multiple CVAs (approx 4 yrs ago, 01/2020, and 09/11/20).  Pt with other PMH that includes:obesity, hyperlipidemia, sleep apnea.  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  Pt has had no prior occupational therapy as she was uninsured at the time.  Pt presents today with R hemiparesis with decr ROM, decr strength, decr coordination, cognitive deficits, decr balance, and visual-perceptual deficits. Pt would benefit from occupational therapy to address these deficits for incr ease, safety, and independence with ADLs, IADLs, and leisure tasks as well as incr RUE functional use.    OT Occupational Profile and History Detailed Assessment- Review of Records and additional review of physical, cognitive, psychosocial history related to current functional performance    Occupational performance deficits (Please refer to evaluation for details): ADL's;IADL's;Work;Leisure    Body Structure / Function / Physical Skills ADL;Decreased knowledge of use of DME;Strength;Dexterity;Balance;UE functional use;Proprioception;ROM;IADL;Vision;Sensation;Mobility;Coordination;FMC    Cognitive Skills Attention;Memory;Problem Solve;Sequencing;Safety Awareness    Rehab Potential Good    Clinical Decision Making Several treatment options, min-mod task modification necessary    Comorbidities Affecting Occupational Performance: May have comorbidities  impacting occupational performance    Modification or Assistance to Complete Evaluation  Min-Moderate modification of tasks or assist with assess necessary to complete eval    OT Frequency 1x / week    OT Duration --   for 9 weeks +eval   OT Treatment/Interventions Self-care/ADL training;Moist Heat;Fluidtherapy;Splinting;Balance training;Therapeutic activities;Therapeutic exercise;Ultrasound;Cognitive remediation/compensation;Visual/perceptual remediation/compensation;Passive range of motion;Functional Mobility Training;Neuromuscular education;Cryotherapy;Energy conservation;Manual Therapy;Patient/family education;Paraffin;DME and/or AE instruction    Plan initiate HEP for closed-chain shoulder ROM, and R hand coordination, give pt Voc Rehab info    Consulted and Agree with Plan of Care Patient             Patient will benefit from skilled therapeutic intervention in order to improve the following deficits and impairments:   Body Structure / Function / Physical Skills: ADL, Decreased knowledge of use of DME, Strength, Dexterity, Balance, UE functional use, Proprioception, ROM, IADL, Vision, Sensation, Mobility, Coordination, [facility] Cognitive Skills: Attention, Memory, Problem Solve, Sequencing, Safety Awareness     Visit Diagnosis: Hemiplegia and hemiparesis following cerebral infarction affecting right non-dominant side (HCC)  Frontal lobe and executive function deficit  Attention and concentration deficit  Unspecified symptoms and signs involving cognitive functions following cerebral infarction  Other lack of coordination  Unsteadiness on feet  Visuospatial deficit  Stiffness of right shoulder, not elsewhere classified    Problem List Patient Active Problem List   Diagnosis Date Noted   Acute DVT (deep venous thrombosis) (HCC) 10/06/2020   Elevated liver function tests 02/05/2020   Pre-diabetes 02/03/2020   Subclinical hypothyroidism 02/03/2020   Tobacco use disorder  01/27/2020   Hyperlipemia 01/27/2020   Obesity 01/27/2020   History of CVA with residual deficit 01/26/2020    Managed medicaid CPT codes: 16109- Therapeutic Exercise, 97112- Neuro Re-education, 97140 - Manual Therapy, 97530 - Therapeutic Activities, 97535 - Self Care, [phone] - Ultrasound, [id number] - Orthotic Fit, [id number] - Fluidotherapy, [id number] -  Paraffin, [id number] - Cognitive training (First 15 min), and 97130 - Cognitive training (each additional 15 min)    [facility] 11/21/2020, 3:08 PM  [facility] [address] [place], [place], [address] Phone: [phone]   Fax:  [phone]  Name: Kym Scannell MRN: [id number] Date of Birth: Jul 12, 1975  Willa Frater, OTR/L [facility] [address] [place], [place]  [address] [phone] phone [phone] 11/21/20 3:08 PM

## 2020-12-21 ENCOUNTER — Ambulatory Visit: Payer: Medicaid Other | Admitting: Occupational Therapy

## 2020-12-21 ENCOUNTER — Ambulatory Visit: Payer: Medicaid Other

## 2020-12-21 ENCOUNTER — Ambulatory Visit: Payer: Medicaid Other | Admitting: Speech Pathology

## 2020-12-22 ENCOUNTER — Other Ambulatory Visit: Payer: Self-pay | Admitting: Student

## 2020-12-22 DIAGNOSIS — E039 Hypothyroidism, unspecified: Secondary | ICD-10-CM

## 2020-12-22 DIAGNOSIS — E7800 Pure hypercholesterolemia, unspecified: Secondary | ICD-10-CM

## 2020-12-22 DIAGNOSIS — I6930 Unspecified sequelae of cerebral infarction: Secondary | ICD-10-CM

## 2020-12-22 MED ORDER — ATORVASTATIN CALCIUM 40 MG PO TABS: 40.0000 mg | ORAL_TABLET | Freq: Every day | ORAL | 0 refills | Status: DC

## 2020-12-22 MED ORDER — APIXABAN 5 MG PO TABS: 5.0000 mg | ORAL_TABLET | Freq: Two times a day (BID) | ORAL | 0 refills | Status: DC

## 2020-12-22 MED ORDER — LEVOTHYROXINE SODIUM 150 MCG PO TABS: 150.0000 ug | ORAL_TABLET | Freq: Every day | ORAL | 0 refills | Status: DC

## 2020-12-22 NOTE — Telephone Encounter (Signed)
Albuterol 8.5 with 3 refills sent 11/15/20. Will forward request for other 3 meds.

## 2020-12-22 NOTE — Telephone Encounter (Signed)
Refill Request   apixaban (ELIQUIS) 5 MG TABS tablet atorvastatin (LIPITOR) 40 MG tablet levothyroxine (SYNTHROID) 150 MCG tablet albuterol (VENTOLIN HFA) 108 (90 Base) MCG/ACT inhaler  Please call medications in to the following:   [facility] [address] [place], [place] [address] Departments: [facility]  [facility] Phone: [phone]

## 2020-12-28 ENCOUNTER — Ambulatory Visit: Payer: Medicaid Other | Attending: Neurology | Admitting: Occupational Therapy

## 2020-12-28 ENCOUNTER — Institutional Professional Consult (permissible substitution): Payer: Medicaid Other | Admitting: Neurology

## 2020-12-28 ENCOUNTER — Ambulatory Visit: Payer: Medicaid Other

## 2021-01-03 ENCOUNTER — Ambulatory Visit: Payer: Medicaid Other | Admitting: Occupational Therapy

## 2021-01-03 ENCOUNTER — Ambulatory Visit: Payer: Medicaid Other

## 2021-01-11 ENCOUNTER — Encounter: Payer: Medicaid Other | Admitting: Occupational Therapy

## 2021-01-11 ENCOUNTER — Ambulatory Visit: Payer: Medicaid Other

## 2021-01-18 ENCOUNTER — Ambulatory Visit: Payer: Medicaid Other

## 2021-01-18 ENCOUNTER — Other Ambulatory Visit: Payer: Self-pay | Admitting: Student

## 2021-01-18 ENCOUNTER — Encounter: Payer: Medicaid Other | Admitting: Occupational Therapy

## 2021-01-18 ENCOUNTER — Encounter: Payer: Medicaid Other | Admitting: Speech Pathology

## 2021-01-18 DIAGNOSIS — I6930 Unspecified sequelae of cerebral infarction: Secondary | ICD-10-CM

## 2021-01-25 ENCOUNTER — Ambulatory Visit: Payer: Medicaid Other

## 2021-01-25 ENCOUNTER — Encounter: Payer: Medicaid Other | Admitting: Occupational Therapy

## 2021-02-02 ENCOUNTER — Ambulatory Visit: Payer: Medicaid Other

## 2021-02-02 ENCOUNTER — Encounter: Payer: Medicaid Other | Admitting: Occupational Therapy

## 2021-02-09 ENCOUNTER — Encounter: Payer: Medicaid Other | Admitting: Occupational Therapy

## 2021-02-09 ENCOUNTER — Ambulatory Visit: Payer: Medicaid Other

## 2021-02-14 ENCOUNTER — Ambulatory Visit: Payer: Medicaid Other

## 2021-02-14 ENCOUNTER — Encounter: Payer: Medicaid Other | Admitting: Occupational Therapy

## 2021-03-01 ENCOUNTER — Encounter: Payer: Medicaid Other | Admitting: Speech Pathology

## 2021-03-07 ENCOUNTER — Encounter: Payer: Self-pay | Admitting: Student

## 2021-03-07 ENCOUNTER — Ambulatory Visit (INDEPENDENT_AMBULATORY_CARE_PROVIDER_SITE_OTHER): Payer: Medicaid Other | Admitting: Student

## 2021-03-07 ENCOUNTER — Other Ambulatory Visit: Payer: Self-pay

## 2021-03-07 VITALS — BP 119/81 | HR 112 | Temp 98.3°F | Resp 24 | Ht 66.0 in | Wt 196.1 lb

## 2021-03-07 DIAGNOSIS — F17200 Nicotine dependence, unspecified, uncomplicated: Secondary | ICD-10-CM | POA: Diagnosis not present

## 2021-03-07 DIAGNOSIS — I69951 Hemiplegia and hemiparesis following unspecified cerebrovascular disease affecting right dominant side: Secondary | ICD-10-CM | POA: Diagnosis not present

## 2021-03-07 DIAGNOSIS — F439 Reaction to severe stress, unspecified: Secondary | ICD-10-CM

## 2021-03-07 DIAGNOSIS — I6930 Unspecified sequelae of cerebral infarction: Secondary | ICD-10-CM

## 2021-03-07 DIAGNOSIS — I69922 Dysarthria following unspecified cerebrovascular disease: Secondary | ICD-10-CM | POA: Diagnosis not present

## 2021-03-07 DIAGNOSIS — R1310 Dysphagia, unspecified: Secondary | ICD-10-CM

## 2021-03-07 DIAGNOSIS — I69991 Dysphagia following unspecified cerebrovascular disease: Secondary | ICD-10-CM

## 2021-03-07 DIAGNOSIS — Z0000 Encounter for general adult medical examination without abnormal findings: Secondary | ICD-10-CM

## 2021-03-07 MED ORDER — NICOTINE 21 MG/24HR TD PT24: 21.0000 mg | MEDICATED_PATCH | TRANSDERMAL | 1 refills | Status: DC

## 2021-03-07 MED ORDER — ALBUTEROL SULFATE HFA 108 (90 BASE) MCG/ACT IN AERS: 1.0000 | INHALATION_SPRAY | Freq: Four times a day (QID) | RESPIRATORY_TRACT | 3 refills | Status: DC | PRN

## 2021-03-07 NOTE — Progress Notes (Signed)
   CC: F/U - Hx of Stroke  HPI:  [name] is a 45 y.o. female with a past medical history stated below and presents today for follow up from her history of multiple CVA's. Please see problem based assessment and plan for additional details.  Past Medical History:  Diagnosis Date   Stroke [facility])     Current Outpatient Medications on File Prior to Visit  Medication Sig Dispense Refill   apixaban (ELIQUIS) 5 MG TABS tablet Take 1 tablet (5 mg total) by mouth 2 (two) times daily. 180 tablet 0   atorvastatin (LIPITOR) 40 MG tablet Take 1 tablet (40 mg total) by mouth daily. 180 tablet 0   levothyroxine (SYNTHROID) 150 MCG tablet Take 1 tablet (150 mcg total) by mouth daily. 90 tablet 0   No current facility-administered medications on file prior to visit.    No family history on file.  Social History   Socioeconomic History   Marital status: Single    Spouse name: Not on file   Number of children: Not on file   Years of education: Not on file   Highest education level: Not on file  Occupational History   Not on file  Tobacco Use   Smoking status: Every Day    Packs/day: 1.00    Types: Cigarettes    Start date: 03/07/2020   Smokeless tobacco: Never   Tobacco comments:    1 pk per day   Substance and Sexual Activity   Alcohol use: Yes   Drug use: Not on file   Sexual activity: Not on file  Other Topics Concern   Not on file  Social History Narrative   Lives with sister   Left Handed   Drinks 2-3 cups caffeine daily   Social Determinants of Health   Financial Resource Strain: Not on file  Food Insecurity: Not on file  Transportation Needs: Not on file  Physical Activity: Not on file  Stress: Not on file  Social Connections: Not on file  Intimate Partner Violence: Not on file    Review of Systems: ROS negative except for what is noted on the assessment and plan.  Vitals:   03/07/21 1515  BP: 119/81  Pulse: (!) 112  Resp: (!) 24  Temp: 98.3 F  (36.8 C)  TempSrc: Oral  SpO2: 98%  Weight: 196 lb 1.6 oz (89 kg)  Height: 5\' 6"  (1.676 m)   Physical Exam: Constitutional: No acute distress HENT: normocephalic atraumatic, Eyes: conjunctiva non-erythematous Neck: supple Cardiovascular: regular rate and rhythm, no m/r/g Pulmonary/Chest: normal work of breathing on room air Abdominal: soft, non-tender, non-distended MSK: normal bulk and tone Neurological: alert & oriented x 3, strength 3/5 RUE/RLE.  Dysarthric speech. No facial asymmetry Skin: warm and dry Psych: normal mood and thought process   Assessment & Plan:   See Encounters Tab for problem based charting.  Patient discussed with Dr. , D.O. [facility], PGY-2 Pager: [phone], Phone: [phone] Date 03/07/2021 Time 6:26 PM

## 2021-03-07 NOTE — Assessment & Plan Note (Signed)
Assessment: Patient with history of multiple CVAs.  Most recent Sep 21, 2020.  She is to follow-up with Dr. Pearlean Brownie in neurology in 1 month.  She last saw him 6 months ago and was doing well at that time instructed to continue on Eliquis 5 mg twice daily.  She has residual deficits of right-sided weakness she uses a cane to ambulate.  She also has dysarthria and some dysphagia.  States she has difficulty getting in and out of her shower and is requesting a shower transfer bench.  She was to instructed to follow-up with physical therapy occupational therapy speech therapy and for sleep study.  However prior to these referrals occurring she moved to [facility] to be with her daughter after her stroke.  She would like to be referred to the specialist.  Plan: -Follow-up with Dr. Pearlean Brownie in 1 month -Referrals placed to physical therapy, Occupational Therapy, speech therapy, sleep study specialist -Continue Eliquis 5 mg twice daily, atorvastatin 40 mg daily

## 2021-03-07 NOTE — Assessment & Plan Note (Signed)
Assessment: Patient with longstanding history of tobacco use disorder.  She is smoked 1 to 2 packs/day for 30 years.  Prior to her most recent CVA she smoked 3 cigarettes a day.  Since her CVA she has had increased dressers and smokes 1 pack/day.  Discussed the importance of smoking cessation to prevent further strokes.  She acknowledges understanding would like to quit.  In the past she has tried nicotine patches which helped.  We will restart her at the high dose 21 mg patch once per day.  Plan: -Smoking cessation, nicotine patch 21 mg once per day

## 2021-03-07 NOTE — Patient Instructions (Signed)
Thank you, Ms.Novella Olive for allowing [place] to provide your care today. Today we discussed .  Stroke We will be working to get you referrals to the physical therapist, occupational therapist, speech therapist, sleep doctor.  You also need to follow-up with Dr. Pearlean Brownie in 1 month.  Please continue to work on smoking we will be ordering you a nicotine patch.  Also be ordering you a tub shower transfer bench.  If you need further help with any of your daily tasks please call our clinic.  We also discussed the recently stressors I will be putting in a referral to our counselor Dr. Sallyanne Kuster.  Please look out for a phone call from our clinic to discuss this.   I have ordered the following labs for you:  Lab Orders  No laboratory test(s) ordered today      Referrals ordered today:   Physical therapy Occupational Therapy Speech therapy Sleep study referral  I have ordered the following medication/changed the following medications:   Stop the following medications: Medications Discontinued During This Encounter  Medication Reason   albuterol (VENTOLIN HFA) 108 (90 Base) MCG/ACT inhaler Reorder     Start the following medications: Meds ordered this encounter  Medications   albuterol (VENTOLIN HFA) 108 (90 Base) MCG/ACT inhaler    Sig: Inhale 1-2 puffs into the lungs every 6 (six) hours as needed for wheezing or shortness of breath.    Dispense:  8.5 g    Refill:  3   nicotine (NICODERM CQ - DOSED IN MG/24 HOURS) 21 mg/24hr patch    Sig: Place 1 patch (21 mg total) onto the skin daily.    Dispense:  30 patch    Refill:  1     Follow up: 2-3 months   Remember: Everything you are going through is very difficult and we are here for you whenever you need please call the number below for any further assistance.   Should you have any questions or concerns please call the internal medicine clinic at [phone].    Thalia Bloodgood, D.O. [facility]

## 2021-03-07 NOTE — Assessment & Plan Note (Signed)
Patient states after her first 2 COVID vaccines as when she had her CVA she would not like to receive anymore.  We discussed the flu vaccine as well as tetanus vaccine.  States her last Pap smear was 2 years ago was abnormal but she was HPV negative.  Discussed with her that at her follow-up appointment she can schedule follow-up Pap smear.

## 2021-03-08 ENCOUNTER — Telehealth: Payer: Self-pay

## 2021-03-08 NOTE — Telephone Encounter (Signed)
I sent a community message to [facility] for this patient requesting a Tub Transfer [address] [place] C11/2/202211:21 AM

## 2021-03-08 NOTE — Progress Notes (Signed)
Internal Medicine Clinic Attending  Case discussed with Dr. Katsadouros  At the time of the visit.  We reviewed the resident's history and exam and pertinent patient test results.  I agree with the assessment, diagnosis, and plan of care documented in the resident's note.  

## 2021-03-09 DIAGNOSIS — R531 Weakness: Secondary | ICD-10-CM | POA: Diagnosis not present

## 2021-03-10 ENCOUNTER — Encounter: Payer: Self-pay | Admitting: Student

## 2021-03-20 ENCOUNTER — Other Ambulatory Visit: Payer: Self-pay | Admitting: Student

## 2021-03-20 ENCOUNTER — Ambulatory Visit: Payer: Medicaid Other | Admitting: Physical Therapy

## 2021-03-20 ENCOUNTER — Ambulatory Visit: Payer: Medicaid Other | Admitting: Occupational Therapy

## 2021-03-20 DIAGNOSIS — E039 Hypothyroidism, unspecified: Secondary | ICD-10-CM

## 2021-03-23 ENCOUNTER — Other Ambulatory Visit: Payer: Self-pay

## 2021-03-23 ENCOUNTER — Ambulatory Visit: Payer: Medicaid Other | Attending: Neurology

## 2021-03-23 ENCOUNTER — Ambulatory Visit: Payer: Medicaid Other | Admitting: Occupational Therapy

## 2021-03-23 VITALS — BP 118/78

## 2021-03-23 DIAGNOSIS — G8929 Other chronic pain: Secondary | ICD-10-CM | POA: Diagnosis present

## 2021-03-23 DIAGNOSIS — I69353 Hemiplegia and hemiparesis following cerebral infarction affecting right non-dominant side: Secondary | ICD-10-CM | POA: Diagnosis present

## 2021-03-23 DIAGNOSIS — R2681 Unsteadiness on feet: Secondary | ICD-10-CM | POA: Insufficient documentation

## 2021-03-23 DIAGNOSIS — R278 Other lack of coordination: Secondary | ICD-10-CM

## 2021-03-23 DIAGNOSIS — M25511 Pain in right shoulder: Secondary | ICD-10-CM | POA: Diagnosis present

## 2021-03-23 DIAGNOSIS — M25611 Stiffness of right shoulder, not elsewhere classified: Secondary | ICD-10-CM | POA: Insufficient documentation

## 2021-03-23 DIAGNOSIS — M6281 Muscle weakness (generalized): Secondary | ICD-10-CM | POA: Diagnosis present

## 2021-03-23 DIAGNOSIS — R2689 Other abnormalities of gait and mobility: Secondary | ICD-10-CM | POA: Diagnosis present

## 2021-03-23 DIAGNOSIS — R41840 Attention and concentration deficit: Secondary | ICD-10-CM

## 2021-03-23 DIAGNOSIS — R41842 Visuospatial deficit: Secondary | ICD-10-CM | POA: Insufficient documentation

## 2021-03-23 DIAGNOSIS — I6930 Unspecified sequelae of cerebral infarction: Secondary | ICD-10-CM | POA: Insufficient documentation

## 2021-03-23 DIAGNOSIS — R41844 Frontal lobe and executive function deficit: Secondary | ICD-10-CM | POA: Insufficient documentation

## 2021-03-23 NOTE — Progress Notes (Signed)
   03/23/21 1101  Plan  Personal Factors and Comorbidities Age;Fitness;Comorbidity 1;Time since onset of injury/illness/exacerbation  Comorbidities prior CVA, hypothyroidism, elevated BMI  Examination-Activity Limitations Locomotion Level;Transfers;Stand;Stairs;Squat;Carry;Lift;Bend  Examination-Participation Restrictions Cleaning;Occupation;Community Activity;Shop;Yard Work  Pt will benefit from skilled therapeutic intervention in order to improve on the following deficits Difficulty walking;Decreased activity tolerance;Decreased balance;Decreased mobility;Decreased strength;Decreased cognition  Stability/Clinical Decision Making Evolving/Moderate complexity  Rehab Potential Good  PT Frequency 1x / week  PT Duration Other (comment) (9 wks)  PT Treatment/Interventions ADLs/Self Care Home Management;Aquatic Therapy;Moist Heat;Cryotherapy;DME Instruction;Gait training;Stair training;Functional mobility training;Therapeutic activities;Therapeutic exercise;Balance training;Neuromuscular re-education;Manual techniques;Patient/family education;Passive range of motion;Energy conservation;Taping  PT Next Visit Plan Continue to work on R LE strengthening, weightbearing/shifting, and static balance. Create HEP accordingly.  PT Home Exercise Plan Access Code [email or web address]  Consulted and Agree with Plan of Care Patient

## 2021-03-23 NOTE — Therapy (Signed)
[facility] [address] [place], [place], [address] Phone: [phone]   Fax:  [phone]  Occupational Therapy Evaluation  Patient Details  Name: Maria Porter MRN: [id number] Date of Birth: [birth year] Referring Provider (OT): Dr. Oswaldo Done   Encounter Date: 03/23/2021   OT End of Session - 03/23/21 1157     Visit Number 1    Number of Visits 25    Date for OT Re-Evaluation 06/21/21    Authorization Type UHC Medicaid    OT Start Time 1100    OT Stop Time 1145    OT Time Calculation (min) 45 min    Activity Tolerance Patient tolerated treatment well    Behavior During Therapy [facility] for tasks assessed/performed             Past Medical History:  Diagnosis Date   Stroke [facility])     Past Surgical History:  Procedure Laterality Date   BUBBLE STUDY  01/29/2020   Procedure: BUBBLE STUDY;  Surgeon: Little Ishikawa, MD;  Location: [facility] ENDOSCOPY;  Service: Cardiovascular;;   TEE WITHOUT CARDIOVERSION N/A 01/29/2020   Procedure: TRANSESOPHAGEAL ECHOCARDIOGRAM (TEE);  Surgeon: Little Ishikawa, MD;  Location: [facility] ENDOSCOPY;  Service: Cardiovascular;  Laterality: N/A;    There were no vitals filed for this visit.   Subjective Assessment - 03/23/21 1108     Subjective  I'm on long term disability now    Pertinent History CVA      PMH:  hx of prior CVA (approx 4 yrs ago affecting L side, 01/2020 affecting R side, and 09/11/20 affecting R side); obesity, hyperlipidemia, sleep apnea    Patient Stated Goals get back to using my Rt arm, improve ability to do crafts    Currently in Pain? No/denies   occasional pain in Rt shoulder w/ higher ranges              [facility] OT Assessment - 03/23/21 1110       Assessment   Medical Diagnosis I63.9 (ICD-10-CM) - Cryptogenic stroke (HCC) Acute CVA in the left MCA territory with left MCA occlusion  I69.320 (ICD-10-CM) - Aphasia as late effect of stroke    Referring  Provider (OT) Dr. Oswaldo Done    Onset Date/Surgical Date 09/21/20    Hand Dominance Left      Precautions   Precautions Fall    Precaution Comments no driving      Balance Screen   Has the patient fallen in the past 6 months No    Has the patient had a decrease in activity level because of a fear of falling?  No    Is the patient reluctant to leave their home because of a fear of falling?  No      Home  Environment   Family/patient expects to be discharged to: Private residence    Living Arrangements Alone   son is staying with patient, daughter in [place]   Type of Home Aartment    Home Access Level entry    Bathroom Shower/Tub Tub/Shower unit    Home Equipment Grab bars - tub/shower;Tub bench;[place] - single point    Lives With Alone   sons staying with her     Prior Function   Level of Independence Independent    Vocation On disability    Radiation protection practitioner    Leisure like to work, Chief Executive Officer, read      ADL   Eating/Feeding Modified independent    Grooming Modified  independent   electric toothbrush, gets hair done (since 2nd stroke)   Upper Body Bathing Modified independent    Lower Body Bathing Modified independent    Upper Body Dressing Increased time    Lower Body Dressing Modified independent;Increased time   leave shoes tied or slip on shoes   Toilet Transfer Modified independent    Toileting - Clothing Manipulation Modified independent;Increased time    Toileting -  Hygiene Modified Independent      IADL   Shopping Needs to be accompanied on any shopping trip   Pt can carry a few groceries   Light Housekeeping Performs light daily tasks such as dishwashing, bed making   folds laundry   Meal Prep Able to complete simple warm meal prep;Able to complete simple cold meal and snack prep   Son cooks dinner, pt has cooked some on stove (egg, heat up soup)   Actor on public transportation when accompanied by another    Medication Management  Is responsible for taking medication in correct dosages at correct time   with alarm   Financial Management --   daughter helps w/ reminders     Mobility   Mobility Status Comments Pt ambulates with walking stick in community and outside, but typically does not use in the home      Written Expression   Handwriting --   denies change (resolved from first stroke)     Vision - History   Baseline Vision Wears glasses only for reading    Additional Comments Pt reports floaters      Cognition   Cognition Comments Pt reports decreased memory      Observation/Other Assessments   Observations dysarthria, mild expressive aphasia. Pt has improved in coordination Rt hand but decreased grip strength and slightly decreased shoulder ROM and increased sh pain since last evaluation 11/21/2020      Sensation   Light Touch Appears Intact      Coordination   9 Hole Peg Test Right;Left    Right 9 Hole Peg Test 35.94 sec    Left 9 Hole Peg Test 18.69 sec    Box and Blocks Rt = 27      Tone   Assessment Location Right Upper Extremity      AROM   Overall AROM  Deficits    Overall AROM Comments R shoulder flex 60-70*, abduction 40*, ER/IR 50%, elbow flex 110*, ext = 90%, forearm distally WFL's.      PROM   Overall PROM Comments decr R shoulder movement approx 50% grossly and approx 90% elbow flex/ext      Strength   Overall Strength Comments LUE proximal strength grossly 5/5, RUE proximal strength limited to above AROM      Hand Function   Right Hand Grip (lbs) 6.1    Left Hand Grip (lbs) 54.6      RUE Tone   RUE Tone Hypertonic;Mild   at shoulder, elbow                               OT Short Term Goals - 03/23/21 1203       OT SHORT TERM GOAL #1   Title Pt will be independent with initial HEP.    Baseline no HEP    Time 4    Period Weeks    Status New      OT SHORT TERM GOAL #2   Title Pt will  demo at least 85* R shoulder flex for functional reach.     Baseline 60-70*    Time 4    Period Weeks    Status New      OT SHORT TERM GOAL #3   Title Pt will improve R hand coordination for ADLs/IADLs as shown by improving score on box and blocks test by at least 10 blocks.    Baseline 27 blocks    Time 4    Period Weeks    Status New      OT SHORT TERM GOAL #4   Title Pt will improve R hand grip strength to at least 15 lbs to assist in opening bottles/containers.    Baseline 6.1 lbs    Time 4    Period Weeks    Status New               OT Long Term Goals - 03/23/21 1204       OT LONG TERM GOAL #1   Title Pt will verbalize understanding of AE/strategies for incr safety/independence with ADLs/IADLs  (including cognitive and visual strategies prn, and for opening jars/medicine bottles)    Baseline dependent, needs education    Time 12    Period Weeks    Status New      OT LONG TERM GOAL #2   Title Pt will demo at least 90* R shoulder flex for functional reach.    Baseline 60-70*    Time 12    Period Weeks    Status New      OT LONG TERM GOAL #3   Title Pt will improve R hand coordination for ADLs/IADLs and leisure tasks as shown by completing 9-hole peg test in 30 sec or less.    Baseline 35.94 sec    Time 12    Period Weeks    Status New      OT LONG TERM GOAL #4   Title Pt will improve R hand grip strength to at least 20 lbs to assist in opening bottles/containers.    Baseline 6.1 lbs    Time 12    Period Weeks    Status New      OT LONG TERM GOAL #5   Title Pt will tolerate shoulder P/ROM to 110* w/ pain less than or equal to 3/10    Baseline only to 85/90*    Time 12    Period Weeks    Status New                   Plan - 03/23/21 1158     Clinical Impression Statement Pt is a 45 y.o. female referred to occupational therapy for CVA.  Pt was seen for evaluation on 11/21/20 but was unable to return secondary to staying temporarily in [place] [facility] w/ daughter. Pt has history of multiple CVAs (approx 4  yrs ago, 01/2020, and 09/11/20).  Pt with other PMH that includes:obesity, hyperlipidemia, sleep apnea.  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  Pt has had no prior occupational therapy as she was uninsured at the time.  Pt presents today with R hemiparesis with decr ROM, decr strength, decr coordination, cognitive deficits, decr balance, and visual-perceptual deficits. Pt would benefit from occupational therapy to address these deficits for incr ease, safety, and independence with ADLs, IADLs, and leisure tasks as well as incr RUE functional use and shoulder ROM.    OT  Occupational Profile and History Detailed Assessment- Review of Records and additional review of physical, cognitive, psychosocial history related to current functional performance    Occupational performance deficits (Please refer to evaluation for details): ADL's;IADL's;Leisure    Body Structure / Function / Physical Skills ADL;Decreased knowledge of use of DME;Strength;Dexterity;Balance;UE functional use;Proprioception;ROM;IADL;Vision;Sensation;Mobility;Coordination;FMC    Cognitive Skills Attention;Memory;Problem Solve;Sequencing;Safety Awareness    Rehab Potential Good    Clinical Decision Making Several treatment options, min-mod task modification necessary    Comorbidities Affecting Occupational Performance: May have comorbidities impacting occupational performance    Modification or Assistance to Complete Evaluation  Min-Moderate modification of tasks or assist with assess necessary to complete eval    OT Frequency 2x / week    OT Duration 12 weeks   PLUS EVAL (anticipate only 8 weeks)   OT Treatment/Interventions Self-care/ADL training;Moist Heat;Fluidtherapy;Splinting;Balance training;Therapeutic activities;Therapeutic exercise;Ultrasound;Cognitive remediation/compensation;Visual/perceptual remediation/compensation;Passive range of motion;Functional Mobility  Training;Neuromuscular education;Cryotherapy;Energy conservation;Manual Therapy;Patient/family education;Paraffin;DME and/or AE instruction;Coping strategies training    Plan initiate HEP for shoulder ROM supine, R hand coordination and grip strength    Consulted and Agree with Plan of Care Patient             Patient will benefit from skilled therapeutic intervention in order to improve the following deficits and impairments:   Body Structure / Function / Physical Skills: ADL, Decreased knowledge of use of DME, Strength, Dexterity, Balance, UE functional use, Proprioception, ROM, IADL, Vision, Sensation, Mobility, Coordination, [facility] Cognitive Skills: Attention, Memory, Problem Solve, Sequencing, Safety Awareness     Visit Diagnosis: Hemiplegia and hemiparesis following cerebral infarction affecting right non-dominant side (HCC)  Other lack of coordination  Muscle weakness (generalized)  Attention and concentration deficit  Frontal lobe and executive function deficit  Stiffness of right shoulder, not elsewhere classified  Chronic right shoulder pain  Unsteadiness on feet  Visuospatial deficit    Problem List Patient Active Problem List   Diagnosis Date Noted   Healthcare maintenance 03/07/2021   Acute DVT (deep venous thrombosis) ([place]) 10/06/2020   Elevated liver function tests 02/05/2020   Pre-diabetes 02/03/2020   Subclinical hypothyroidism 02/03/2020   Tobacco use disorder 01/27/2020   Hyperlipemia 01/27/2020   Obesity 01/27/2020   History of CVA with residual deficit 01/26/2020    Carey Bullocks, OTR/L 03/23/2021, 12:07 PM  [place] [address] [place], [place], [address] Phone: [phone]   Fax:  [phone]  Name: Maria Porter MRN: [id number] Date of Birth: 05-04-1976

## 2021-03-23 NOTE — Therapy (Signed)
[place] [address] [place], [place], [address] Phone: [phone]   Fax:  [phone]  Physical Therapy Evaluation  Patient Details  Name: Maria Porter MRN: [id number] Date of Birth: June 28, 1975 Referring Provider (PT): Axel Filler, MD   Encounter Date: 03/23/2021   PT End of Session - 03/23/21 1014     Visit Number 1    Number of Visits 9    Date for PT Re-Evaluation 05/04/21    Authorization Type UHC Medicaid - 27 visit limit, 3 used previously    Authorization Time Period 03/23/21-05/04/21    Authorization - Visit Number 27    Progress Note Due on Visit 10    PT Start Time [id number]    PT Stop Time 1100    PT Time Calculation (min) 45 min    Equipment Utilized During Treatment Gait belt    Activity Tolerance Patient tolerated treatment well    Behavior During Therapy [facility] for tasks assessed/performed             Past Medical History:  Diagnosis Date   Stroke [facility])     Past Surgical History:  Procedure Laterality Date   BUBBLE STUDY  01/29/2020   Procedure: BUBBLE STUDY;  Surgeon: [name], MD;  Location: [place];  Service: Cardiovascular;;   TEE WITHOUT CARDIOVERSION N/A 01/29/2020   Procedure: TRANSESOPHAGEAL ECHOCARDIOGRAM (TEE);  Surgeon: [name], MD;  Location: [place];  Service: Cardiovascular;  Laterality: N/A;    Vitals:   03/23/21 1059  BP: 118/78          [facility] PT Assessment - 03/23/21 0001       Assessment   Medical Diagnosis I63.9 (ICD-10-CM) - Cryptogenic stroke ([place]) Acute CVA in the left MCA territory with left MCA occlusion  I69.320 (ICD-10-CM) - Aphasia as late effect of stroke    Referring Provider (PT) Axel Filler, MD    Onset Date/Surgical Date 09/21/20    Hand Dominance Left    Prior Therapy None after first 2 CVAs (did not have insurance at the time)      Restrictions   Weight Bearing Restrictions No       Balance Screen   Has the patient fallen in the past 6 months No      [place] residence    Living Arrangements Alone    Available Help at Discharge Family    Type of [place] to enter    Entrance Stairs-Number of Steps 0    Entrance Stairs-Rails Can reach both    [place] One level    [place] - single point;Walker - 2 wheels      Prior Function   Level of Independence Independent    Vocation On disability    Gaffer Appears Intact      Coordination   Gross Motor Movements are Fluid and Coordinated Yes      Strength   Overall Strength Deficits    Strength Assessment Site Hip;Knee;Ankle    Right/Left Hip Right    Right Hip Flexion 3/5    Right Hip Extension 3/5    Right Knee Flexion 3+/5    Right Knee Extension 3+/5    Right Ankle Dorsiflexion 3/5    Right Ankle Plantar Flexion 3/5      Transfers   Transfers Sit to  Stand;Stand to Sit;Supine to Sit    Sit to Stand 5: Supervision;6: Modified independent (Device/Increase time)    Five time sit to stand comments  30.2s    Stand to Sit 5: Supervision;6: Modified independent (Device/Increase time)    Supine to Sit 6: Modified independent (Device/Increase time)      Ambulation/Gait   Ambulation/Gait Yes    Ambulation Distance (Feet) 300 Feet    Assistive device Straight cane    Gait Pattern Step-through pattern;Decreased weight shift to right    Ambulation Surface Level;Indoor      Standardized Balance Assessment   Standardized Balance Assessment Berg Balance Test;Dynamic Gait Index;Timed Up and Go Test;Five Times Sit to Stand      Berg Balance Test   Sit to Stand Able to stand  independently using hands    Standing Unsupported Able to stand 30 seconds unsupported    Sitting with Back Unsupported but Feet Supported on Floor or Stool Able to sit safely and securely 2 minutes    Stand to  Sit Controls descent by using hands    Transfers Able to transfer safely, definite need of hands    Standing Unsupported with Eyes Closed Able to stand 3 seconds    Standing Unsupported with Feet Together Able to place feet together independently but unable to hold for 30 seconds    From Standing, Reach Forward with Outstretched Arm Can reach forward >12 cm safely (5")    From Standing Position, Pick up Object from Floor Unable to try/needs assist to keep balance    From Standing Position, Turn to Look Behind Over each Shoulder Looks behind one side only/other side shows less weight shift    Turn 360 Degrees Able to turn 360 degrees safely but slowly    Standing Unsupported, Alternately Place Feet on Step/Stool Able to complete >2 steps/needs minimal assist    Standing Unsupported, One Foot in Front Needs help to step but can hold 15 seconds    Standing on One Leg Unable to try or needs assist to prevent fall    Total Score 29    Berg comment: difficulty with R cervical rotation      Dynamic Gait Index   Level Surface Mild Impairment    Change in Gait Speed Mild Impairment    Gait with Horizontal Head Turns Moderate Impairment    Gait with Vertical Head Turns Moderate Impairment    Gait and Pivot Turn Moderate Impairment    Step Over Obstacle Moderate Impairment    Step Around Obstacles Mild Impairment    Steps Moderate Impairment    Total Score 11    DGI comment: mild balance losses with head motions, walking stick used.      Timed Up and Go Test   Normal TUG (seconds) 27    TUG Comments 27s w/o AD; 20s with walking stick                        Objective measurements completed on examination: See above findings.                  PT Short Term Goals - 03/23/21 1110       PT SHORT TERM GOAL #1   Title Pt will be independent with strengthening HEP    Baseline [id number]    Time 2    Period Weeks    Status New    Target Date 04/13/21      PT SHORT  TERM GOAL #2   Title Pt will have improved 5x STS to </=20 sec    Baseline 30.1 s with UE support    Time 2    Period Weeks    Status New    Target Date 04/13/21      PT SHORT TERM GOAL #3   Title Patient to negotiate full flight of stairs with most appropriate support and pattern.    Baseline 4 steps with L rail and step to pattern    Time 2    Period Weeks    Status New    Target Date 04/13/21      PT SHORT TERM GOAL #4   Title --    Baseline --    Time --    Period --    Status --    Target Date --               PT Long Term Goals - 03/23/21 1113       PT LONG TERM GOAL #1   Title Pt will be independent with self progressing her strengthening HEP    Baseline [id number]    Time 4    Period Weeks    Status New    Target Date 04/27/21      PT LONG TERM GOAL #2   Title Pt will have improved 5x STS </=13 sec to demo low fall risk    Baseline 30.1s    Time 4    Period Weeks    Status New    Target Date 04/27/21      PT LONG TERM GOAL #3   Title Pt will have improved TUG score of </=15 sec to demo low fall risk with no A/D    Baseline TUG score 27s w/o AD    Time 4    Period Weeks    Status New    Target Date 04/27/21      PT LONG TERM GOAL #4   Title Pt will have improved Berg Balance Score of at least 45/56 to demo low fall risk    Baseline Initial BERG score 29    Time 4    Period Weeks    Status New    Target Date 04/27/21      PT LONG TERM GOAL #5   Title Pt will be able to amb without a/d for 1043ft' indoors and outdoors to demo improved community mobility    Baseline 330ft through clinic with walking stick and S    Time 4    Period Weeks    Status New    Target Date 04/27/21      Additional Long Term Goals   Additional Long Term Goals Yes      PT LONG TERM GOAL #6   Title Increase DGI score to 18/24    Baseline Initial DGI score 11/24    Time 4    Period Weeks    Status New    Target Date 04/27/21                    Plan  - 03/23/21 1101     Clinical Impression Statement [name] is a 45 y/o F rturning to OPPT s/p L MCA CVA as she had moved to [place].  On assessment, pt demos decreased R LE strength, poor static balance with fair dynamic balance while using SPC/walking stick. At this time, pt's Berg Balance, TUG, and 5x STS all indicate a high fall  risk compared to prior testing in last episode. Pt with goals for incrased LE strength and improved balance. Pt would benefit from PT to optimize her mobility and increase independence at home.    Personal Factors and Comorbidities Age;Fitness;Comorbidity 1;Time since onset of injury/illness/exacerbation    Comorbidities prior CVA, hypothyroidism, elevated BMI    Examination-Activity Limitations Locomotion Level;Transfers;Stand;Stairs;Squat;Carry;Lift;Bend    Examination-Participation Restrictions Cleaning;Occupation;Community Activity;Shop;Yard Work    Merchant navy officer Evolving/Moderate complexity    Rehab Potential Good    PT Frequency 2x / week    PT Duration 4 weeks   9 wks   PT Treatment/Interventions ADLs/Self Care Home Management;Aquatic Therapy;Moist Heat;DME Instruction;Gait training;Stair training;Functional mobility training;Therapeutic activities;Therapeutic exercise;Balance training;Neuromuscular re-education;Manual techniques;Patient/family education;Passive range of motion;Energy conservation    PT Next Visit Plan static and dynamic balance training    PT Home Exercise Plan Access Code [id number]; 03/23/21 reviewed with patient    Recommended Other Services OT and ST pending    Consulted and Agree with Plan of Care Patient             Patient will benefit from skilled therapeutic intervention in order to improve the following deficits and impairments:  Difficulty walking, Decreased activity tolerance, Decreased balance, Decreased mobility, Decreased strength, Decreased cognition, Abnormal gait, Decreased safety awareness,  Dizziness  Visit Diagnosis: Muscle weakness (generalized)  Unsteadiness on feet  Other abnormalities of gait and mobility  Hemiplegia and hemiparesis following cerebral infarction affecting right non-dominant side [facility])     Problem List Patient Active Problem List   Diagnosis Date Noted   Healthcare maintenance 03/07/2021   Acute DVT (deep venous thrombosis) ([place]) 10/06/2020   Elevated liver function tests 02/05/2020   Pre-diabetes 02/03/2020   Subclinical hypothyroidism 02/03/2020   Tobacco use disorder 01/27/2020   Hyperlipemia 01/27/2020   Obesity 01/27/2020   History of CVA with residual deficit 01/26/2020    Lanice Shirts, PT 03/23/2021, 11:21 AM  [place] [address] [place], [place], [address] Phone: [phone]   Fax:  [phone]  Name: Maria Porter MRN: [id number] Date of Birth: 11-Jun-1975

## 2021-04-03 ENCOUNTER — Encounter: Payer: Self-pay | Admitting: Physical Therapy

## 2021-04-03 ENCOUNTER — Other Ambulatory Visit: Payer: Self-pay

## 2021-04-03 ENCOUNTER — Encounter: Payer: Self-pay | Admitting: Occupational Therapy

## 2021-04-03 ENCOUNTER — Ambulatory Visit: Payer: Medicaid Other | Admitting: Physical Therapy

## 2021-04-03 ENCOUNTER — Ambulatory Visit: Payer: Medicaid Other | Admitting: Occupational Therapy

## 2021-04-03 ENCOUNTER — Other Ambulatory Visit: Payer: Self-pay | Admitting: Student

## 2021-04-03 VITALS — BP 116/85 | HR 87

## 2021-04-03 DIAGNOSIS — R2681 Unsteadiness on feet: Secondary | ICD-10-CM

## 2021-04-03 DIAGNOSIS — M6281 Muscle weakness (generalized): Secondary | ICD-10-CM

## 2021-04-03 DIAGNOSIS — I69353 Hemiplegia and hemiparesis following cerebral infarction affecting right non-dominant side: Secondary | ICD-10-CM

## 2021-04-03 DIAGNOSIS — R41844 Frontal lobe and executive function deficit: Secondary | ICD-10-CM

## 2021-04-03 DIAGNOSIS — R278 Other lack of coordination: Secondary | ICD-10-CM

## 2021-04-03 DIAGNOSIS — R2689 Other abnormalities of gait and mobility: Secondary | ICD-10-CM

## 2021-04-03 DIAGNOSIS — R41840 Attention and concentration deficit: Secondary | ICD-10-CM

## 2021-04-03 DIAGNOSIS — M25611 Stiffness of right shoulder, not elsewhere classified: Secondary | ICD-10-CM

## 2021-04-03 DIAGNOSIS — E7800 Pure hypercholesterolemia, unspecified: Secondary | ICD-10-CM

## 2021-04-03 DIAGNOSIS — G8929 Other chronic pain: Secondary | ICD-10-CM

## 2021-04-03 DIAGNOSIS — R41842 Visuospatial deficit: Secondary | ICD-10-CM

## 2021-04-03 NOTE — Therapy (Addendum)
[facility] [address] [place], [place], [address] Phone: [phone]   Fax:  [phone]  Physical Therapy Treatment  Patient Details  Name: Maria Porter MRN: [id number] Date of Birth: 1976/04/27 Referring Provider (PT): Tyson Alias, MD   Encounter Date: 04/03/2021   PT End of Session - 04/03/21 0807     Visit Number 2    Number of Visits 9    Date for PT Re-Evaluation 05/04/21    Authorization Type U[employer] Medicaid - 27 visit limit, 3 used previously    Authorization Time Period 03/23/21-05/04/21    Progress Note Due on Visit 10    PT Start Time 0803    PT Stop Time 0845    PT Time Calculation (min) 42 min    Equipment Utilized During Treatment Gait belt    Activity Tolerance Patient tolerated treatment well    Behavior During Therapy [facility] for tasks assessed/performed             Past Medical History:  Diagnosis Date   Stroke [facility])     Past Surgical History:  Procedure Laterality Date   BUBBLE STUDY  01/29/2020   Procedure: BUBBLE STUDY;  Surgeon: Little Ishikawa, MD;  Location: [facility] ENDOSCOPY;  Service: Cardiovascular;;   TEE WITHOUT CARDIOVERSION N/A 01/29/2020   Procedure: TRANSESOPHAGEAL ECHOCARDIOGRAM (TEE);  Surgeon: Little Ishikawa, MD;  Location: [facility] ENDOSCOPY;  Service: Cardiovascular;  Laterality: N/A;    Vitals:   04/03/21 0810  BP: 116/85  Pulse: 87     Subjective Assessment - 04/03/21 0805     Subjective No falls. Has not been doing anything for exercise lately.    Pertinent History prior CVA, hypothyroidism, elevated BMI    How long can you sit comfortably? no issues    How long can you stand comfortably? No issues    How long can you walk comfortably? Sometimes legs get achy, needs to take her time and she can get around grocery store    Patient Stated Goals Back to work as a Engineer, civil (consulting) (hopes to be within the next 6 month); wants to be able to drive and walk  without the cane    Currently in Pain? No/denies                               [facility] Adult PT Treatment/Exercise - 04/03/21 0833       Ambulation/Gait   Ambulation/Gait Yes    Ambulation/Gait Assistance 5: Supervision    Ambulation/Gait Assistance Details Cued to use walking stick in LUE  (pt ambulates into session and throughout holding with RUE)    Ambulation Distance (Feet) 115 Feet   plus distances throughout session   Assistive device --   Walking stick   Gait Pattern Step-through pattern;Decreased weight shift to right    Ambulation Surface Level;Indoor      Exercises   Exercises Other Exercises    Other Exercises  Sidelying RLE clamshells x10 reps     Knee/Hip Exercises: Aerobic   Nustep At gear 1.0 with BLE and LUE (pt reported discomfort with RUE) for 5 minutes for strength, endurance, activity tolerance.                 Access Code: [email or web address] URL: [email or web address] Date: 04/03/2021 Prepared by: Sherlie Ban  Initiated HEP for strength/balance. See MedBridge for more details.   Exercises Supine Bridge - 2 x daily - 5 x  weekly - 1 sets - 10 reps Staggered Sit-to-Stand - 1 x daily - 5 x weekly - 2 sets - 5 reps - with RLE posteriorly,cues to use hands and to hinge from hips.  Heel Raises with Counter Support - 2 x daily - 5 x weekly - 1-2 sets - 10 reps Standing Single Leg Stance with Counter Support - 2 x daily - 5 x weekly - 3 sets - 10 hold - both sides.  Standing Balance with Eyes Closed - 1 x daily - 5 x weekly - 3 sets - 10 hold -in corner with chair as needed for balance.     PT Education - 04/03/21 0847     Education Details Initial HEP, gait training with walking stick.    Person(s) Educated Patient    Methods Explanation;Demonstration;Handout    Comprehension Verbalized understanding;Returned demonstration              PT Short Term Goals - 03/23/21 1110       PT SHORT TERM GOAL #1   Title Pt  will be independent with strengthening HEP    Baseline HFJF4F2    Time 2    Period Weeks    Status New    Target Date 04/13/21      PT SHORT TERM GOAL #2   Title Pt will have improved 5x STS to </=20 sec    Baseline 30.1 s with UE support    Time 2    Period Weeks    Status New    Target Date 04/13/21      PT SHORT TERM GOAL #3   Title Patient to negotiate full flight of stairs with most appropriate support and pattern.    Baseline 4 steps with L rail and step to pattern    Time 2    Period Weeks    Status New    Target Date 04/13/21      PT SHORT TERM GOAL #4   Title --    Baseline --    Time --    Period --    Status --    Target Date --               PT Long Term Goals - 03/23/21 1113       PT LONG TERM GOAL #1   Title Pt will be independent with self progressing her strengthening HEP    Baseline HFJF4F2    Time 4    Period Weeks    Status New    Target Date 04/27/21      PT LONG TERM GOAL #2   Title Pt will have improved 5x STS </=13 sec to demo low fall risk    Baseline 30.1s    Time 4    Period Weeks    Status New    Target Date 04/27/21      PT LONG TERM GOAL #3   Title Pt will have improved TUG score of </=15 sec to demo low fall risk with no A/D    Baseline TUG score 27s w/o AD    Time 4    Period Weeks    Status New    Target Date 04/27/21      PT LONG TERM GOAL #4   Title Pt will have improved Berg Balance Score of at least 45/56 to demo low fall risk    Baseline Initial BERG score 29    Time 4    Period Weeks  Status New    Target Date 04/27/21      PT LONG TERM GOAL #5   Title Pt will be able to amb without a/d for 1071ft' indoors and outdoors to demo improved community mobility    Baseline 344ft through clinic with walking stick and S    Time 4    Period Weeks    Status New    Target Date 04/27/21      Additional Long Term Goals   Additional Long Term Goals Yes      PT LONG TERM GOAL #6   Title Increase DGI score to  18/24    Baseline Initial DGI score 11/24    Time 4    Period Weeks    Status New    Target Date 04/27/21                   Plan - 04/03/21 1154     Clinical Impression Statement Today's skilled session focused on initiating HEP for RLE>LLE strengthening and standing balance working on decr UE support. Cued pt to hold walking stick in LUE to help with safety/stability during gait. Pt tolerated well, will continue to progress towards LTGs.    Personal Factors and Comorbidities Age;Fitness;Comorbidity 1;Time since onset of injury/illness/exacerbation    Comorbidities prior CVA, hypothyroidism, elevated BMI    Examination-Activity Limitations Locomotion Level;Transfers;Stand;Stairs;Squat;Carry;Lift;Bend    Examination-Participation Restrictions Cleaning;Occupation;Community Activity;Shop;Yard Work    Conservation officer, historic buildings Evolving/Moderate complexity    Rehab Potential Good    PT Frequency 2x / week    PT Duration 4 weeks   9 wks   PT Treatment/Interventions ADLs/Self Care Home Management;Aquatic Therapy;Moist Heat;DME Instruction;Gait training;Stair training;Functional mobility training;Therapeutic activities;Therapeutic exercise;Balance training;Neuromuscular re-education;Manual techniques;Patient/family education;Passive range of motion;Energy conservation    PT Next Visit Plan balance with focus on SLS, RLE weight bearing, tandem, eyes closed, narrow BOS. RLE strength. NuStep/Scifit    PT Home Exercise Plan [email or web address]    Consulted and Agree with Plan of Care Patient             Patient will benefit from skilled therapeutic intervention in order to improve the following deficits and impairments:  Difficulty walking, Decreased activity tolerance, Decreased balance, Decreased mobility, Decreased strength, Decreased cognition, Abnormal gait, Decreased safety awareness, Dizziness  Visit Diagnosis: Hemiplegia and hemiparesis following cerebral infarction affecting  right non-dominant side (HCC)  Other lack of coordination  Muscle weakness (generalized)  Unsteadiness on feet     Problem List Patient Active Problem List   Diagnosis Date Noted   Healthcare maintenance 03/07/2021   Acute DVT (deep venous thrombosis) (HCC) 10/06/2020   Elevated liver function tests 02/05/2020   Pre-diabetes 02/03/2020   Subclinical hypothyroidism 02/03/2020   Tobacco use disorder 01/27/2020   Hyperlipemia 01/27/2020   Obesity 01/27/2020   History of CVA with residual deficit 01/26/2020    Drake Leach, PT, DPT 04/03/2021, 11:56 AM  [facility] [address] [place], [place], [address] Phone: [phone]   Fax:  [phone]  Name: Maria Porter MRN: [id number] Date of Birth: [birth year]

## 2021-04-03 NOTE — Patient Instructions (Addendum)
    Hand Activities  Perform the following activities for 15-20 minutes 1-2 times per day with right hand(s).  Rotate ball in fingertips (clockwise and counter-clockwise). Flip cards 1 at a time  Deal cards with your thumb (Hold deck in hand and push card off top with thumb). Shuffle cards. Pick up coins and stack. Pick up coins one at a time until you get 5-10 in your hand, then move coins from palm to fingertips to place in coin bank or container one at a time. Squeeze red putty x20 Roll out putty and pinch with each finger/thumb down length of putty.       Place right arm on table top and slide it forward until a stretch is felt. Hold 5 seconds. Relax.  Repeat 10-15 times. Do 2 sessions per day.   Then repeat going out to the side.    SELF ASSISTED WITH OBJECT: Shoulder Abduction / External Rotation (Crutch)    Place one hand on crutch or cane. Move arm away from body. 10 reps, 2 times per day  SELF ASSISTED WITH OBJECT: Shoulder Flexion / Elbow Extension (Crutch)    Place one hand on crutch or other assistive device. Move arm forward, straighten elbow. 10-15 reps per set, 2 days per week Lower crutch if shoulder range of motion is limited.     Cane Overhead - Standing   Sit With arms straight, hold cane forward at waist. Raise cane slowly as far as you can without pain  Sit up tall. Hold 3 seconds. Repeat 10 times. Do 2 times per day.

## 2021-04-03 NOTE — Therapy (Addendum)
[facility] [address] [place], [place], [address] Phone: [phone]   Fax:  [phone]  Occupational Therapy Treatment  Patient Details  Name: Maria Porter MRN: [id number] Date of Birth: 09-14-1975 Referring Provider (OT): Dr. Oswaldo Done   Encounter Date: 04/03/2021   OT End of Session - 04/03/21 0851     Visit Number 2    Number of Visits 25    Date for OT Re-Evaluation 06/21/21    Authorization Type UHC Medicaid    OT Start Time [phone]    OT Stop Time 0930    OT Time Calculation (min) 39 min    Activity Tolerance Patient tolerated treatment well    Behavior During Therapy [facility] for tasks assessed/performed             Past Medical History:  Diagnosis Date   Stroke [facility])     Past Surgical History:  Procedure Laterality Date   BUBBLE STUDY  01/29/2020   Procedure: BUBBLE STUDY;  Surgeon: Little Ishikawa, MD;  Location: [facility] ENDOSCOPY;  Service: Cardiovascular;;   TEE WITHOUT CARDIOVERSION N/A 01/29/2020   Procedure: TRANSESOPHAGEAL ECHOCARDIOGRAM (TEE);  Surgeon: Little Ishikawa, MD;  Location: [facility] ENDOSCOPY;  Service: Cardiovascular;  Laterality: N/A;    There were no vitals filed for this visit.   Subjective Assessment - 04/03/21 0850     Subjective  I miss working but I am on long-term disability now    Pertinent History CVA      PMH:  hx of prior CVA (approx 4 yrs ago affecting L side, 01/2020 affecting R side, and 09/11/20 affecting R side); obesity, hyperlipidemia, sleep apnea    Patient Stated Goals get back to using my Rt arm, improve ability to do crafts    Currently in Pain? No/denies                    OT Education - 04/03/21 0856     Education Details Coordination and Putty HEP (red) and gentle shoulder AAROM ONLY within PAIN-FREE range--see pt instructions.  Pt instructed not use to use weights or exercise bands with RUE due to pain and risk of injury.    Person(s)  Educated Patient    Methods Explanation;Demonstration;Handout    Comprehension Verbalized understanding;Returned demonstration              OT Short Term Goals - 03/23/21 1203       OT SHORT TERM GOAL #1   Title Pt will be independent with initial HEP.    Baseline no HEP    Time 4    Period Weeks    Status New      OT SHORT TERM GOAL #2   Title Pt will demo at least 85* R shoulder flex for functional reach.    Baseline 60-70*    Time 4    Period Weeks    Status New      OT SHORT TERM GOAL #3   Title Pt will improve R hand coordination for ADLs/IADLs as shown by improving score on box and blocks test by at least 10 blocks.    Baseline 27 blocks    Time 4    Period Weeks    Status New      OT SHORT TERM GOAL #4   Title Pt will improve R hand grip strength to at least 15 lbs to assist in opening bottles/containers.    Baseline 6.1 lbs    Time 4  Period Weeks    Status New               OT Long Term Goals - 03/23/21 1204       OT LONG TERM GOAL #1   Title Pt will verbalize understanding of AE/strategies for incr safety/independence with ADLs/IADLs  (including cognitive and visual strategies prn, and for opening jars/medicine bottles)    Baseline dependent, needs education    Time 12    Period Weeks    Status New      OT LONG TERM GOAL #2   Title Pt will demo at least 90* R shoulder flex for functional reach.    Baseline 60-70*    Time 12    Period Weeks    Status New      OT LONG TERM GOAL #3   Title Pt will improve R hand coordination for ADLs/IADLs and leisure tasks as shown by completing 9-hole peg test in 30 sec or less.    Baseline 35.94 sec    Time 12    Period Weeks    Status New      OT LONG TERM GOAL #4   Title Pt will improve R hand grip strength to at least 20 lbs to assist in opening bottles/containers.    Baseline 6.1 lbs    Time 12    Period Weeks    Status New      OT LONG TERM GOAL #5   Title Pt will tolerate shoulder P/ROM  to 110* w/ pain less than or equal to 3/10    Baseline only to 85/90*    Time 12    Period Weeks    Status New                   Plan - 04/03/21 9211     Clinical Impression Statement Pt is progressing towards goals with understanding of initial HEP.  However, R shoulder pain affects functional use.  Pt educated to move within pain free range and no weights/exercise bands.    OT Occupational Profile and History Detailed Assessment- Review of Records and additional review of physical, cognitive, psychosocial history related to current functional performance    Occupational performance deficits (Please refer to evaluation for details): ADL's;IADL's;Leisure    Body Structure / Function / Physical Skills ADL;Decreased knowledge of use of DME;Strength;Dexterity;Balance;UE functional use;Proprioception;ROM;IADL;Vision;Sensation;Mobility;Coordination;FMC    Cognitive Skills Attention;Memory;Problem Solve;Sequencing;Safety Awareness    Rehab Potential Good    Clinical Decision Making Several treatment options, min-mod task modification necessary    Comorbidities Affecting Occupational Performance: May have comorbidities impacting occupational performance    Modification or Assistance to Complete Evaluation  Min-Moderate modification of tasks or assist with assess necessary to complete eval    OT Frequency 2x / week    OT Duration 12 weeks   PLUS EVAL (anticipate only 8 weeks)   OT Treatment/Interventions Self-care/ADL training;Moist Heat;Fluidtherapy;Splinting;Balance training;Therapeutic activities;Therapeutic exercise;Ultrasound;Cognitive remediation/compensation;Visual/perceptual remediation/compensation;Passive range of motion;Functional Mobility Training;Neuromuscular education;Cryotherapy;Energy conservation;Manual Therapy;Patient/family education;Paraffin;DME and/or AE instruction;Coping strategies training    Plan review HEPs, further address R shoulder pain    Recommended Other  Services Speech therapy - to schedule beginning in January when visits start over    Consulted and Agree with Plan of Care Patient             Patient will benefit from skilled therapeutic intervention in order to improve the following deficits and impairments:   Body Structure / Function / Physical Skills: ADL, Decreased knowledge  of use of DME, Strength, Dexterity, Balance, UE functional use, Proprioception, ROM, IADL, Vision, Sensation, Mobility, Coordination, [facility] Cognitive Skills: Attention, Memory, Problem Solve, Sequencing, Safety Awareness     Visit Diagnosis: Hemiplegia and hemiparesis following cerebral infarction affecting right non-dominant side (HCC)  Other lack of coordination  Muscle weakness (generalized)  Unsteadiness on feet  Attention and concentration deficit  Frontal lobe and executive function deficit  Stiffness of right shoulder, not elsewhere classified  Chronic right shoulder pain  Visuospatial deficit  Other abnormalities of gait and mobility    Problem List Patient Active Problem List   Diagnosis Date Noted   Healthcare maintenance 03/07/2021   Acute DVT (deep venous thrombosis) (HCC) 10/06/2020   Elevated liver function tests 02/05/2020   Pre-diabetes 02/03/2020   Subclinical hypothyroidism 02/03/2020   Tobacco use disorder 01/27/2020   Hyperlipemia 01/27/2020   Obesity 01/27/2020   History of CVA with residual deficit 01/26/2020    [facility], OT/L 04/03/2021, 11:19 AM  [facility] [address] [place], [place], [address] Phone: [phone]   Fax:  [phone]  Name: Maria Porter MRN: [id number] Date of Birth: 20-Sep-1975  Willa Frater, OTR/L [facility] [address] [place], [place]  [address] [phone] phone [phone] 04/03/21 11:19 AM

## 2021-04-03 NOTE — Patient Instructions (Signed)
Access Code: [email or web address] URL: [email or web address] Date: 04/03/2021 Prepared by: Sherlie Ban  Exercises Supine Bridge - 2 x daily - 5 x weekly - 1 sets - 10 reps Staggered Sit-to-Stand - 1 x daily - 5 x weekly - 2 sets - 5 reps Heel Raises with Counter Support - 2 x daily - 5 x weekly - 1-2 sets - 10 reps Standing Single Leg Stance with Counter Support - 2 x daily - 5 x weekly - 3 sets - 10 hold Standing Balance with Eyes Closed - 1 x daily - 5 x weekly - 3 sets - 10 hold

## 2021-04-05 ENCOUNTER — Ambulatory Visit: Payer: Medicaid Other | Admitting: Behavioral Health

## 2021-04-05 DIAGNOSIS — F419 Anxiety disorder, unspecified: Secondary | ICD-10-CM

## 2021-04-05 DIAGNOSIS — F331 Major depressive disorder, recurrent, moderate: Secondary | ICD-10-CM

## 2021-04-05 NOTE — BH Specialist Note (Signed)
Integrated Behavioral Health via Telemedicine Visit  04/05/2021 Maria Porter [id number]  Number of Integrated Behavioral Health visits: 1/6 Session Start time: 9:00am  Session End time: 9:30am Total time: 30  Referring Provider: Dr. Belva Agee, DO Patient/Family location: Pt is home in private [facility] Provider location: [facility] Office All persons participating in visit: Pt & Clinician Types of Service: Health Promotion and Introduction only  I connected with Novella Olive and/or [name]  self  via  Telephone or Engineer, civil (consulting)  (Video is Caregility application) and verified that I am speaking with the correct person using two identifiers. Discussed confidentiality: Yes   I discussed the limitations of telemedicine and the availability of in person appointments.  Discussed there is a possibility of technology failure and discussed alternative modes of communication if that failure occurs.  I discussed that engaging in this telemedicine visit, they consent to the provision of behavioral healthcare and the services will be billed under their insurance.  Patient and/or legal guardian expressed understanding and consented to Telemedicine visit: Yes   Presenting Concerns: Patient and/or family reports the following symptoms/concerns: elevated anx/dep due to familial situation since Pt's health status changes have occurred. Pt has Hx of 3 CVAs & is trying to acclimate to the move to [place]. Pt lives w/her 29yo Son Shirlyn Goltz who is job searching & has [employer] Home Interview today. Pt feels isolation since move to [place] one mos ago. Duration of problem: months to years; Severity of problem: moderate  Patient and/or Family's Strengths/Protective Factors: Social and Emotional competence, Concrete supports in place (healthy food, safe environments, etc.), and Sense of purpose  Goals Addressed: Patient will:  Reduce symptoms of: anxiety, depression, stress, and  isolation    Increase knowledge and/or ability of: coping skills, healthy habits, stress reduction, and decrease of isolation/loneliness    Demonstrate ability to: Increase healthy adjustment to current life circumstances, Increase adequate support systems for patient/family, and Begin healthy grieving over loss of health 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 2021  Progress towards Goals: Estb'd today; Pt will call the [facility] Board & find out what to do for Reinstatement of LPN License.  Interventions: Interventions utilized:  Motivational Interviewing, Behavioral Activation, and Supportive Counseling Standardized Assessments completed:  screeners prn  Patient and/or Family Response: Pt receptive to call today & requests future appt  Assessment: Patient currently experiencing heightened sense of isolation that is leading to dep.   Patient may benefit from taking action on her Nsg License & securing info on the Reinstatement process.  Plan: Follow up with behavioral health clinician on : 2-3 wks on telehealth for 60 min Behavioral recommendations: Keep a notebook btwn sessions to promote focus. Referral(s): Integrated [employer] (In Clinic)  I discussed the assessment and treatment plan with the patient and/or parent/guardian. They were provided an opportunity to ask questions and all were answered. They agreed with the plan and demonstrated an understanding of the instructions.   They were advised to call back or seek an in-person evaluation if the symptoms worsen or if the condition fails to improve as anticipated.  Deneise Lever, LMFT

## 2021-04-06 ENCOUNTER — Other Ambulatory Visit: Payer: Self-pay | Admitting: Student

## 2021-04-06 DIAGNOSIS — I6930 Unspecified sequelae of cerebral infarction: Secondary | ICD-10-CM

## 2021-04-10 ENCOUNTER — Encounter: Payer: Self-pay | Admitting: Physical Therapy

## 2021-04-10 ENCOUNTER — Ambulatory Visit: Payer: Medicaid Other | Attending: Neurology | Admitting: Physical Therapy

## 2021-04-10 ENCOUNTER — Other Ambulatory Visit: Payer: Self-pay

## 2021-04-10 ENCOUNTER — Ambulatory Visit: Payer: Medicaid Other | Admitting: Occupational Therapy

## 2021-04-10 DIAGNOSIS — R41840 Attention and concentration deficit: Secondary | ICD-10-CM | POA: Diagnosis present

## 2021-04-10 DIAGNOSIS — M25611 Stiffness of right shoulder, not elsewhere classified: Secondary | ICD-10-CM | POA: Insufficient documentation

## 2021-04-10 DIAGNOSIS — R278 Other lack of coordination: Secondary | ICD-10-CM | POA: Diagnosis present

## 2021-04-10 DIAGNOSIS — G8929 Other chronic pain: Secondary | ICD-10-CM

## 2021-04-10 DIAGNOSIS — R2689 Other abnormalities of gait and mobility: Secondary | ICD-10-CM | POA: Diagnosis present

## 2021-04-10 DIAGNOSIS — I69353 Hemiplegia and hemiparesis following cerebral infarction affecting right non-dominant side: Secondary | ICD-10-CM | POA: Diagnosis present

## 2021-04-10 DIAGNOSIS — I69319 Unspecified symptoms and signs involving cognitive functions following cerebral infarction: Secondary | ICD-10-CM | POA: Insufficient documentation

## 2021-04-10 DIAGNOSIS — R2681 Unsteadiness on feet: Secondary | ICD-10-CM | POA: Diagnosis present

## 2021-04-10 DIAGNOSIS — M6281 Muscle weakness (generalized): Secondary | ICD-10-CM | POA: Diagnosis not present

## 2021-04-10 DIAGNOSIS — R41842 Visuospatial deficit: Secondary | ICD-10-CM | POA: Insufficient documentation

## 2021-04-10 DIAGNOSIS — R41844 Frontal lobe and executive function deficit: Secondary | ICD-10-CM | POA: Insufficient documentation

## 2021-04-10 DIAGNOSIS — M25511 Pain in right shoulder: Secondary | ICD-10-CM | POA: Diagnosis present

## 2021-04-10 NOTE — Patient Instructions (Addendum)
Access Code: [email or web address] URL: [email or web address] Date: 04/10/2021 Prepared by: Waldon Merl  Reviewed exercises below 04/10/21:  Exercises Supine Bridge - 2 x daily - 5 x weekly - 1 sets - 10 reps (performed 2nd set of 10; cues to hold contraction 3 secs) Staggered Sit-to-Stand - 1 x daily - 5 x weekly - 1-2 sets - 5 reps (2x5) Heel Raises with Counter Support - 2 x daily - 5 x weekly - 1-2 sets - 10 reps  (2x10) Standing Single Leg Stance with Counter Support - 2 x daily - 5 x weekly - 3 sets - 10 hold (cues for decreased UE support) Standing Balance with Eyes Closed - 1 x daily - 5 x weekly - 3 sets - 10 hold ( cues for ankle strategies; performed feet together with head turns, intermittent UE support needed with cues for visual strategies.

## 2021-04-10 NOTE — Therapy (Signed)
[facility] Health Outpt Rehabilitation [facility] [address] [place], [place], [address] Phone: [phone]   Fax:  [phone]  Physical Therapy Treatment  Patient Details  Name: Maria Porter MRN: [id number] Date of Birth: [birth year] Referring Provider (PT): Tyson Alias, MD   Encounter Date: 04/10/2021   PT End of Session - 04/10/21 1015     Visit Number 3    Number of Visits 9    Date for PT Re-Evaluation 05/04/21    Authorization Type [employer] Medicaid - 27 visit limit, 3 used previously    Authorization Time Period 03/23/21-05/04/21    Authorization - Visit Number 27    Progress Note Due on Visit 10    PT Start Time 0935    PT Stop Time 1014    PT Time Calculation (min) 39 min             Past Medical History:  Diagnosis Date   Stroke [facility])     Past Surgical History:  Procedure Laterality Date   BUBBLE STUDY  01/29/2020   Procedure: BUBBLE STUDY;  Surgeon: Little Ishikawa, MD;  Location: [facility] ENDOSCOPY;  Service: Cardiovascular;;   TEE WITHOUT CARDIOVERSION N/A 01/29/2020   Procedure: TRANSESOPHAGEAL ECHOCARDIOGRAM (TEE);  Surgeon: Little Ishikawa, MD;  Location: [facility] ENDOSCOPY;  Service: Cardiovascular;  Laterality: N/A;    There were no vitals filed for this visit.   Subjective Assessment - 04/10/21 0933     Subjective Pt fell last Tuesday tripping over stick when standing up from sitting.  Right arm feels sore from fall. Reports checking BPs at home and that they have been good.    Pertinent History prior CVA, hypothyroidism, elevated BMI    How long can you walk comfortably? Sometimes legs get achy, needs to take her time and she can get around grocery store    Patient Stated Goals Back to work as a Engineer, civil (consulting) (hopes to be within the next 6 month); wants to be able to drive and walk without the cane    Currently in Pain? Yes    Pain Score 2     Pain Location Arm    Pain Orientation Right    Pain Descriptors /  Indicators Aching    Pain Type Acute pain    Pain Onset In the past 7 days    Pain Frequency Constant                                        PT Education - 04/10/21 0947     Education Details Reviewed HEP, discussed smoking as risk factor for CVA.   Person(s) Educated Patient    Methods Explanation;Handout    Comprehension Verbalized understanding;Returned demonstration;Verbal cues required              PT Short Term Goals - 03/23/21 1110       PT SHORT TERM GOAL #1   Title Pt will be independent with strengthening HEP    Baseline [id number]    Time 2    Period Weeks    Status New    Target Date 04/13/21      PT SHORT TERM GOAL #2   Title Pt will have improved 5x STS to </=20 sec    Baseline 30.1 s with UE support    Time 2    Period Weeks    Status New    Target  Date 04/13/21      PT SHORT TERM GOAL #3   Title Patient to negotiate full flight of stairs with most appropriate support and pattern.    Baseline 4 steps with L rail and step to pattern    Time 2    Period Weeks    Status New    Target Date 04/13/21      PT SHORT TERM GOAL #4   Title --    Baseline --    Time --    Period --    Status --    Target Date --               PT Long Term Goals - 03/23/21 1113       PT LONG TERM GOAL #1   Title Pt will be independent with self progressing her strengthening HEP    Baseline [id number]    Time 4    Period Weeks    Status New    Target Date 04/27/21      PT LONG TERM GOAL #2   Title Pt will have improved 5x STS </=13 sec to demo low fall risk    Baseline 30.1s    Time 4    Period Weeks    Status New    Target Date 04/27/21      PT LONG TERM GOAL #3   Title Pt will have improved TUG score of </=15 sec to demo low fall risk with no A/D    Baseline TUG score 27s w/o AD    Time 4    Period Weeks    Status New    Target Date 04/27/21      PT LONG TERM GOAL #4   Title Pt will have improved Berg Balance Score of  at least 45/56 to demo low fall risk    Baseline Initial BERG score 29    Time 4    Period Weeks    Status New    Target Date 04/27/21      PT LONG TERM GOAL #5   Title Pt will be able to amb without a/d for 1066ft' indoors and outdoors to demo improved community mobility    Baseline 312ft through clinic with walking stick and S    Time 4    Period Weeks    Status New    Target Date 04/27/21      Additional Long Term Goals   Additional Long Term Goals Yes      PT LONG TERM GOAL #6   Title Increase DGI score to 18/24    Baseline Initial DGI score 11/24    Time 4    Period Weeks    Status New    Target Date 04/27/21                   Plan - 04/10/21 0952     Clinical Impression Statement Pt performed HEP with good understanding with min cues for technique and tolerated greater number reps.    Personal Factors and Comorbidities Age;Fitness;Comorbidity 1;Time since onset of injury/illness/exacerbation    Comorbidities prior CVA, hypothyroidism, elevated BMI    Examination-Activity Limitations Locomotion Level;Transfers;Stand;Stairs;Squat;Carry;Lift;Bend    Examination-Participation Restrictions Cleaning;Occupation;Community Activity;Shop;Yard Work    Conservation officer, historic buildings Evolving/Moderate complexity    Rehab Potential Good    PT Frequency 2x / week    PT Duration 4 weeks   9 wks   PT Treatment/Interventions ADLs/Self Care Home Management;Aquatic Therapy;Moist Heat;DME Instruction;Gait training;Stair training;Functional mobility  training;Therapeutic activities;Therapeutic exercise;Balance training;Neuromuscular re-education;Manual techniques;Patient/family education;Passive range of motion;Energy conservation    PT Next Visit Plan balance with focus on SLS, RLE weight bearing, tandem, eyes closed, narrow BOS. RLE strength. NuStep/Scifit    PT Home Exercise Plan [id number]    Consulted and Agree with Plan of Care Patient             Patient will  benefit from skilled therapeutic intervention in order to improve the following deficits and impairments:  Difficulty walking, Decreased activity tolerance, Decreased balance, Decreased mobility, Decreased strength, Decreased cognition, Abnormal gait, Decreased safety awareness, Dizziness  Visit Diagnosis: Muscle weakness (generalized)  Unsteadiness on feet     Problem List Patient Active Problem List   Diagnosis Date Noted   Healthcare maintenance 03/07/2021   Acute DVT (deep venous thrombosis) (HCC) 10/06/2020   Elevated liver function tests 02/05/2020   Pre-diabetes 02/03/2020   Subclinical hypothyroidism 02/03/2020   Tobacco use disorder 01/27/2020   Hyperlipemia 01/27/2020   Obesity 01/27/2020   History of CVA with residual deficit 01/26/2020    Hortencia Conradi, PTA  04/10/21, 12:27 PM   [facility] [address] [place], [place], [address] Phone: [phone]   Fax:  [phone]  Name: Kooper Chriswell MRN: [id number] Date of Birth: 1976/01/27

## 2021-04-10 NOTE — Therapy (Signed)
[facility] Health Outpt Rehabilitation [facility] [address] [place], [place], [address] Phone: [phone]   Fax:  [phone]  Occupational Therapy Treatment  Patient Details  Name: Maria Porter MRN: [id number] Date of Birth: 04-18-1976 Referring Provider (OT): Dr. Oswaldo Done   Encounter Date: 04/10/2021   OT End of Session - 04/10/21 1106     Visit Number 3    Number of Visits 25    Date for OT Re-Evaluation 06/21/21    Authorization Type [employer] Medicaid    OT Start Time 1015    OT Stop Time 1100    OT Time Calculation (min) 45 min    Activity Tolerance Patient tolerated treatment well    Behavior During Therapy [facility] for tasks assessed/performed             Past Medical History:  Diagnosis Date   Stroke [facility])     Past Surgical History:  Procedure Laterality Date   BUBBLE STUDY  01/29/2020   Procedure: BUBBLE STUDY;  Surgeon: Little Ishikawa, MD;  Location: [facility] ENDOSCOPY;  Service: Cardiovascular;;   TEE WITHOUT CARDIOVERSION N/A 01/29/2020   Procedure: TRANSESOPHAGEAL ECHOCARDIOGRAM (TEE);  Surgeon: Little Ishikawa, MD;  Location: [facility] ENDOSCOPY;  Service: Cardiovascular;  Laterality: N/A;    There were no vitals filed for this visit.   Subjective Assessment - 04/10/21 1015     Subjective  I fell last Tuesday on my Rt side. I woke up this morning w/ pain 2/10 but took Tylenol and it's better    Pertinent History CVA      PMH:  hx of prior CVA (approx 4 yrs ago affecting L side, 01/2020 affecting R side, and 09/11/20 affecting R side); obesity, hyperlipidemia, sleep apnea    Patient Stated Goals get back to using my Rt arm, improve ability to do crafts    Currently in Pain? Yes    Pain Score 1     Pain Location Shoulder    Pain Orientation Right    Pain Descriptors / Indicators Aching    Pain Type Acute pain    Pain Onset In the past 7 days    Pain Frequency Constant    Aggravating Factors  malpositioning, recent fall    Pain  Relieving Factors heat pack, tylenol, proper positioning            Reviewed theraputty HEP for hand strength - pt reported only doing pinch ex, not grip ex. Reviewed each, and adapted pinch to just first 3 fingers.   Reviewed all coordination activities from HEP for functional use and coordination Rt hand. Clarified second coin activity (see pt instructions from 04/03/21)  Reviewed seated shoulder ex's for neuro re-education and pain management and modified table slides to using both hands initially before using just RUE.   Supine: attempted BUE sh flexion, however Rt shoulder pain increased, therefore modified ex to self ROM in chest press BUE's followed by rolling to Lt side and Rt side holding UE's in sh flex at nose level. Pt also very guarded and anticipates pain and then hikes shoulders which causes improper scapulohumeral rhythm and increased pain (perpetual cycle of pain) therefore, emphasis on downward depression of scapulae and relaxing which allowed pt to increase ROM w/o increase in pain.  Seated: holding paper towel roll and reaching towards floor BUE's.  (Issue above 2 as HEP next session)  OT Short Term Goals - 04/10/21 1107       OT SHORT TERM GOAL #1   Title Pt will be independent with initial HEP.    Baseline no HEP    Time 4    Period Weeks    Status On-going      OT SHORT TERM GOAL #2   Title Pt will demo at least 85* R shoulder flex for functional reach.    Baseline 60-70*    Time 4    Period Weeks    Status On-going      OT SHORT TERM GOAL #3   Title Pt will improve R hand coordination for ADLs/IADLs as shown by improving score on box and blocks test by at least 10 blocks.    Baseline 27 blocks    Time 4    Period Weeks    Status On-going      OT SHORT TERM GOAL #4   Title Pt will improve R hand grip strength to at least 15 lbs to assist in opening bottles/containers.    Baseline 6.1 lbs    Time 4     Period Weeks    Status On-going               OT Long Term Goals - 03/23/21 1204       OT LONG TERM GOAL #1   Title Pt will verbalize understanding of AE/strategies for incr safety/independence with ADLs/IADLs  (including cognitive and visual strategies prn, and for opening jars/medicine bottles)    Baseline dependent, needs education    Time 12    Period Weeks    Status New      OT LONG TERM GOAL #2   Title Pt will demo at least 90* R shoulder flex for functional reach.    Baseline 60-70*    Time 12    Period Weeks    Status New      OT LONG TERM GOAL #3   Title Pt will improve R hand coordination for ADLs/IADLs and leisure tasks as shown by completing 9-hole peg test in 30 sec or less.    Baseline 35.94 sec    Time 12    Period Weeks    Status New      OT LONG TERM GOAL #4   Title Pt will improve R hand grip strength to at least 20 lbs to assist in opening bottles/containers.    Baseline 6.1 lbs    Time 12    Period Weeks    Status New      OT LONG TERM GOAL #5   Title Pt will tolerate shoulder P/ROM to 110* w/ pain less than or equal to 3/10    Baseline only to 85/90*    Time 12    Period Weeks    Status New                   Plan - 04/10/21 1107     Clinical Impression Statement Pt is progressing towards goals with understanding of initial HEP.  However, R shoulder pain affects functional use.    OT Occupational Profile and History Detailed Assessment- Review of Records and additional review of physical, cognitive, psychosocial history related to current functional performance    Occupational performance deficits (Please refer to evaluation for details): ADL's;IADL's;Leisure    Body Structure / Function / Physical Skills ADL;Decreased knowledge of use of DME;Strength;Dexterity;Balance;UE functional use;Proprioception;ROM;IADL;Vision;Sensation;Mobility;Coordination;FMC    Cognitive Skills Attention;Memory;Problem Solve;Sequencing;Safety Awareness  Rehab Potential Good    Clinical Decision Making Several treatment options, min-mod task modification necessary    Comorbidities Affecting Occupational Performance: May have comorbidities impacting occupational performance    Modification or Assistance to Complete Evaluation  Min-Moderate modification of tasks or assist with assess necessary to complete eval    OT Frequency 2x / week    OT Duration 12 weeks   PLUS EVAL (anticipate only 8 weeks)   OT Treatment/Interventions Self-care/ADL training;Moist Heat;Fluidtherapy;Splinting;Balance training;Therapeutic activities;Therapeutic exercise;Ultrasound;Cognitive remediation/compensation;Visual/perceptual remediation/compensation;Passive range of motion;Functional Mobility Training;Neuromuscular education;Cryotherapy;Energy conservation;Manual Therapy;Patient/family education;Paraffin;DME and/or AE instruction;Coping strategies training    Plan review and issue shoulder ex in supine (for ROM w/in pain free range) and w/ paper towel roll reaching towards floor, continue RUE functional use in low ranges, coordination, grip strength    Recommended Other Services Speech therapy - to schedule beginning in January when visits start over    Consulted and Agree with Plan of Care Patient             Patient will benefit from skilled therapeutic intervention in order to improve the following deficits and impairments:   Body Structure / Function / Physical Skills: ADL, Decreased knowledge of use of DME, Strength, Dexterity, Balance, UE functional use, Proprioception, ROM, IADL, Vision, Sensation, Mobility, Coordination, [facility] Cognitive Skills: Attention, Memory, Problem Solve, Sequencing, Safety Awareness     Visit Diagnosis: Hemiplegia and hemiparesis following cerebral infarction affecting right non-dominant side (HCC)  Chronic right shoulder pain  Other lack of coordination  Attention and concentration deficit    Problem List Patient Active  Problem List   Diagnosis Date Noted   Healthcare maintenance 03/07/2021   Acute DVT (deep venous thrombosis) (HCC) 10/06/2020   Elevated liver function tests 02/05/2020   Pre-diabetes 02/03/2020   Subclinical hypothyroidism 02/03/2020   Tobacco use disorder 01/27/2020   Hyperlipemia 01/27/2020   Obesity 01/27/2020   History of CVA with residual deficit 01/26/2020    Kelli Churn, OTR/L 04/10/2021, 11:11 AM  [facility] [address] [place], [place], [address] Phone: [phone]   Fax:  [phone]  Name: Maria Porter MRN: [id number] Date of Birth: [birth year]

## 2021-04-13 ENCOUNTER — Ambulatory Visit: Payer: Medicaid Other | Admitting: Occupational Therapy

## 2021-04-13 ENCOUNTER — Ambulatory Visit: Payer: Medicaid Other | Admitting: Physical Therapy

## 2021-04-17 ENCOUNTER — Ambulatory Visit: Payer: Medicaid Other | Admitting: Physical Therapy

## 2021-04-17 ENCOUNTER — Other Ambulatory Visit: Payer: Self-pay

## 2021-04-17 ENCOUNTER — Encounter: Payer: Self-pay | Admitting: Physical Therapy

## 2021-04-17 ENCOUNTER — Ambulatory Visit: Payer: Medicaid Other | Admitting: Occupational Therapy

## 2021-04-17 DIAGNOSIS — R278 Other lack of coordination: Secondary | ICD-10-CM

## 2021-04-17 DIAGNOSIS — M25511 Pain in right shoulder: Secondary | ICD-10-CM

## 2021-04-17 DIAGNOSIS — R2681 Unsteadiness on feet: Secondary | ICD-10-CM

## 2021-04-17 DIAGNOSIS — I69353 Hemiplegia and hemiparesis following cerebral infarction affecting right non-dominant side: Secondary | ICD-10-CM

## 2021-04-17 DIAGNOSIS — M6281 Muscle weakness (generalized): Secondary | ICD-10-CM

## 2021-04-17 DIAGNOSIS — G8929 Other chronic pain: Secondary | ICD-10-CM

## 2021-04-17 NOTE — Therapy (Signed)
[facility] Health Outpt Rehabilitation [facility] [address] [place], [place], [address] Phone: [phone]   Fax:  [phone]  Occupational Therapy Treatment  Patient Details  Name: Maria Porter MRN: [id number] Date of Birth: January 02, 1976 Referring Provider (OT): Dr. Oswaldo Done   Encounter Date: 04/17/2021   OT End of Session - 04/17/21 1036     Visit Number 4    Number of Visits 25    Date for OT Re-Evaluation 06/21/21    Authorization Type UHC Medicaid    OT Start Time 1020    OT Stop Time 1100    OT Time Calculation (min) 40 min    Activity Tolerance Patient tolerated treatment well    Behavior During Therapy [facility] for tasks assessed/performed             Past Medical History:  Diagnosis Date   Stroke [facility])     Past Surgical History:  Procedure Laterality Date   BUBBLE STUDY  01/29/2020   Procedure: BUBBLE STUDY;  Surgeon: Little Ishikawa, MD;  Location: [facility] ENDOSCOPY;  Service: Cardiovascular;;   TEE WITHOUT CARDIOVERSION N/A 01/29/2020   Procedure: TRANSESOPHAGEAL ECHOCARDIOGRAM (TEE);  Surgeon: Little Ishikawa, MD;  Location: [facility] ENDOSCOPY;  Service: Cardiovascular;  Laterality: N/A;    There were no vitals filed for this visit.   Subjective Assessment - 04/17/21 1017     Subjective  The shoulder ex's you gave me are helping. I was able to open 2 bags of chips    Pertinent History CVA      PMH:  hx of prior CVA (approx 4 yrs ago affecting L side, 01/2020 affecting R side, and 09/11/20 affecting R side); obesity, hyperlipidemia, sleep apnea    Patient Stated Goals get back to using my Rt arm, improve ability to do crafts    Currently in Pain? No/denies    Pain Onset In the past 7 days             Issued printout of shoulder HEP and reviewed from last session.   Rt Grip strength = 8 lbs   Digiflex x 20 for grip strength (red resistance)  Clothespins activity for RUE ROM/functional reaching, pinch strength, and  coord. - pt seated for yellow to red then stood for green resistance d/t shoulder  Pt placing small pegs in pegboard Rt hand for coordination, copying design w/ min cues/assist to correct  Supine: continued modified shoulder ex's to prevent pain. Worked on shoulder flexion with elbow bent and small circumduction ex's at approx 80* flex. As well as reviewing and performing HEP                      OT Education - 04/17/21 1026     Education Details shoulder HEP    Person(s) Educated Patient    Methods Explanation;Demonstration;Handout    Comprehension Verbalized understanding;Returned demonstration              OT Short Term Goals - 04/10/21 1107       OT SHORT TERM GOAL #1   Title Pt will be independent with initial HEP.    Baseline no HEP    Time 4    Period Weeks    Status On-going      OT SHORT TERM GOAL #2   Title Pt will demo at least 85* R shoulder flex for functional reach.    Baseline 60-70*    Time 4    Period Weeks    Status  On-going      OT SHORT TERM GOAL #3   Title Pt will improve R hand coordination for ADLs/IADLs as shown by improving score on box and blocks test by at least 10 blocks.    Baseline 27 blocks    Time 4    Period Weeks    Status On-going      OT SHORT TERM GOAL #4   Title Pt will improve R hand grip strength to at least 15 lbs to assist in opening bottles/containers.    Baseline 6.1 lbs    Time 4    Period Weeks    Status On-going               OT Long Term Goals - 03/23/21 1204       OT LONG TERM GOAL #1   Title Pt will verbalize understanding of AE/strategies for incr safety/independence with ADLs/IADLs  (including cognitive and visual strategies prn, and for opening jars/medicine bottles)    Baseline dependent, needs education    Time 12    Period Weeks    Status New      OT LONG TERM GOAL #2   Title Pt will demo at least 90* R shoulder flex for functional reach.    Baseline 60-70*    Time 12     Period Weeks    Status New      OT LONG TERM GOAL #3   Title Pt will improve R hand coordination for ADLs/IADLs and leisure tasks as shown by completing 9-hole peg test in 30 sec or less.    Baseline 35.94 sec    Time 12    Period Weeks    Status New      OT LONG TERM GOAL #4   Title Pt will improve R hand grip strength to at least 20 lbs to assist in opening bottles/containers.    Baseline 6.1 lbs    Time 12    Period Weeks    Status New      OT LONG TERM GOAL #5   Title Pt will tolerate shoulder P/ROM to 110* w/ pain less than or equal to 3/10    Baseline only to 85/90*    Time 12    Period Weeks    Status New                   Plan - 04/17/21 1336     Clinical Impression Statement Pt progressing with improved shoulder pain but still unable to go past a certain point. ? impingement, however Rt scapula appeared hypermobile as well.    OT Occupational Profile and History Detailed Assessment- Review of Records and additional review of physical, cognitive, psychosocial history related to current functional performance    Occupational performance deficits (Please refer to evaluation for details): ADL's;IADL's;Leisure    Body Structure / Function / Physical Skills ADL;Decreased knowledge of use of DME;Strength;Dexterity;Balance;UE functional use;Proprioception;ROM;IADL;Vision;Sensation;Mobility;Coordination;FMC    Cognitive Skills Attention;Memory;Problem Solve;Sequencing;Safety Awareness    Rehab Potential Good    Clinical Decision Making Several treatment options, min-mod task modification necessary    Comorbidities Affecting Occupational Performance: May have comorbidities impacting occupational performance    Modification or Assistance to Complete Evaluation  Min-Moderate modification of tasks or assist with assess necessary to complete eval    OT Frequency 2x / week    OT Duration 12 weeks   PLUS EVAL (anticipate only 8 weeks)   OT Treatment/Interventions  Self-care/ADL training;Moist Heat;Fluidtherapy;Splinting;Balance training;Therapeutic activities;Therapeutic exercise;Ultrasound;Cognitive remediation/compensation;Visual/perceptual  remediation/compensation;Passive range of motion;Functional Mobility Training;Neuromuscular education;Cryotherapy;Energy conservation;Manual Therapy;Patient/family education;Paraffin;DME and/or AE instruction;Coping strategies training    Plan continue shoulder ex's supine, consider wt bearing and ex's in prone for scapula strengthening    Recommended Other Services Speech therapy - to schedule beginning in January when visits start over    Consulted and Agree with Plan of Care Patient             Patient will benefit from skilled therapeutic intervention in order to improve the following deficits and impairments:   Body Structure / Function / Physical Skills: ADL, Decreased knowledge of use of DME, Strength, Dexterity, Balance, UE functional use, Proprioception, ROM, IADL, Vision, Sensation, Mobility, Coordination, [facility] Cognitive Skills: Attention, Memory, Problem Solve, Sequencing, Safety Awareness     Visit Diagnosis: Hemiplegia and hemiparesis following cerebral infarction affecting right non-dominant side (HCC)  Other lack of coordination  Muscle weakness (generalized)  Chronic right shoulder pain    Problem List Patient Active Problem List   Diagnosis Date Noted   Healthcare maintenance 03/07/2021   Acute DVT (deep venous thrombosis) (HCC) 10/06/2020   Elevated liver function tests 02/05/2020   Pre-diabetes 02/03/2020   Subclinical hypothyroidism 02/03/2020   Tobacco use disorder 01/27/2020   Hyperlipemia 01/27/2020   Obesity 01/27/2020   History of CVA with residual deficit 01/26/2020    Kelli Churn, OTR/L 04/17/2021, 1:39 PM  [place] [facility] [address] [place], [place], [address] Phone: [phone]   Fax:   [phone]  Name: Maria Porter MRN: [id number] Date of Birth: 01/06/1976

## 2021-04-17 NOTE — Patient Instructions (Addendum)
    1) Table slides seated at table (folded towel underneath arms) - slide both arms out in front of you and back x 10, then can try sliding side to side but STOP if painful  2) Holding paper towel roll w/ both hands (seated) and reach towards floor and back to lap x 10  3) Laying down: clasp hands together and reach up towards ceiling like a chest press, then roll slowly side to side to stretch arms x 10

## 2021-04-17 NOTE — Therapy (Signed)
[facility] [address] [place], [place], [address] Phone: [phone]   Fax:  [phone]  Physical Therapy Treatment  Patient Details  Name: Maria Porter MRN: [id number] Date of Birth: 1975-07-24 Referring Provider (PT): Tyson Alias, MD   Encounter Date: 04/17/2021   PT End of Session - 04/17/21 1014     Visit Number 4    Number of Visits 9    Date for PT Re-Evaluation 05/04/21    Authorization Type [employer] Medicaid - 27 visit limit, 3 used previously    Authorization Time Period 03/23/21-05/04/21    Authorization - Visit Number 27    Progress Note Due on Visit 10    PT Start Time 0930    PT Stop Time 1014    PT Time Calculation (min) 44 min    Behavior During Therapy [facility] for tasks assessed/performed             Past Medical History:  Diagnosis Date   Stroke [facility])     Past Surgical History:  Procedure Laterality Date   BUBBLE STUDY  01/29/2020   Procedure: BUBBLE STUDY;  Surgeon: Little Ishikawa, MD;  Location: [facility] ENDOSCOPY;  Service: Cardiovascular;;   TEE WITHOUT CARDIOVERSION N/A 01/29/2020   Procedure: TRANSESOPHAGEAL ECHOCARDIOGRAM (TEE);  Surgeon: Little Ishikawa, MD;  Location: [facility] ENDOSCOPY;  Service: Cardiovascular;  Laterality: N/A;    There were no vitals filed for this visit.   Subjective Assessment - 04/17/21 0931     Subjective Walked out in the community this weekend with son, using cane. And cooked Sunday dinner with son.    Pertinent History prior CVA, hypothyroidism, elevated BMI    How long can you sit comfortably? no issues    How long can you stand comfortably? No issues    How long can you walk comfortably? Sometimes legs get achy, needs to take her time and she can get around grocery store    Currently in Pain? Yes    Pain Onset In the past 7 days                                    Balance Exercises - 04/17/21 0001        Balance Exercises: Standing   Standing Eyes Opened Narrow base of support (BOS);Solid surface;30 secs;Other (comment)   1.feet together with head turns and nods, working ankle strategies,2. progressing with eyes closed, cues for balance strategies. 3. standing on compliant surface (eyes open) - feet apart with head movements progressing with feet together. supervision .   Standing, One Foot on a Step Eyes open   walking forward tapping cones with alt foot then walking around cone working on SLS and stabilizing between movments. CGA required   Retro Gait Foam/compliant surface;4 reps   amb on/off foam pad. cues for heel clearance.   Sidestepping Foam/compliant support   in squat position working on balance and LE strength, Supervison level.   Marching Foam/compliant surface   marching forward on/off foam pad, CGA. no AD   Other Standing Exercises Tall kneel: working on core stability and LE strengthening with upper trunk rotations progressing wit 1/2 kneel (alt) with bil UE raises. Intermittent UE support.                  PT Short Term Goals - 03/23/21 1110       PT SHORT TERM GOAL #  1   Title Pt will be independent with strengthening HEP    Baseline HFJF4F2    Time 2    Period Weeks    Status New    Target Date 04/13/21      PT SHORT TERM GOAL #2   Title Pt will have improved 5x STS to </=20 sec    Baseline 30.1 s with UE support    Time 2    Period Weeks    Status New    Target Date 04/13/21      PT SHORT TERM GOAL #3   Title Patient to negotiate full flight of stairs with most appropriate support and pattern.    Baseline 4 steps with L rail and step to pattern    Time 2    Period Weeks    Status New    Target Date 04/13/21      PT SHORT TERM GOAL #4   Title --    Baseline --    Time --    Period --    Status --    Target Date --               PT Long Term Goals - 03/23/21 1113       PT LONG TERM GOAL #1   Title Pt will be independent with self  progressing her strengthening HEP    Baseline HFJF4F2    Time 4    Period Weeks    Status New    Target Date 04/27/21      PT LONG TERM GOAL #2   Title Pt will have improved 5x STS </=13 sec to demo low fall risk    Baseline 30.1s    Time 4    Period Weeks    Status New    Target Date 04/27/21      PT LONG TERM GOAL #3   Title Pt will have improved TUG score of </=15 sec to demo low fall risk with no A/D    Baseline TUG score 27s w/o AD    Time 4    Period Weeks    Status New    Target Date 04/27/21      PT LONG TERM GOAL #4   Title Pt will have improved Berg Balance Score of at least 45/56 to demo low fall risk    Baseline Initial BERG score 29    Time 4    Period Weeks    Status New    Target Date 04/27/21      PT LONG TERM GOAL #5   Title Pt will be able to amb without a/d for 1038ft' indoors and outdoors to demo improved community mobility    Baseline 320ft through clinic with walking stick and S    Time 4    Period Weeks    Status New    Target Date 04/27/21      Additional Long Term Goals   Additional Long Term Goals Yes      PT LONG TERM GOAL #6   Title Increase DGI score to 18/24    Baseline Initial DGI score 11/24    Time 4    Period Weeks    Status New    Target Date 04/27/21                   Plan - 04/17/21 1014     Clinical Impression Statement Pt requires CGA with SLS when tapping cones and walking forward.  Pt  progressed with compliant surface standing  balance with feet together with no UE support needed. Pt tolerated tall kneel and 1/2 kneel core  and LE strengthening with intermittent UE support.    Personal Factors and Comorbidities Age;Fitness;Comorbidity 1;Time since onset of injury/illness/exacerbation    Comorbidities prior CVA, hypothyroidism, elevated BMI    Examination-Activity Limitations Locomotion Level;Transfers;Stand;Stairs;Squat;Carry;Lift;Bend    Examination-Participation Restrictions Cleaning;Occupation;Community  Activity;Shop;Yard Work    Conservation officer, historic buildings Evolving/Moderate complexity    Rehab Potential Good    PT Frequency 2x / week    PT Duration 4 weeks   9 wks   PT Treatment/Interventions ADLs/Self Care Home Management;Aquatic Therapy;Moist Heat;DME Instruction;Gait training;Stair training;Functional mobility training;Therapeutic activities;Therapeutic exercise;Balance training;Neuromuscular re-education;Manual techniques;Patient/family education;Passive range of motion;Energy conservation    PT Next Visit Plan balance with focus on SLS, RLE weight bearing, tandem, eyes closed, narrow BOS. RLE strength. NuStep/Scifit    PT Home Exercise Plan [email or web address]    Consulted and Agree with Plan of Care Patient             Patient will benefit from skilled therapeutic intervention in order to improve the following deficits and impairments:  Difficulty walking, Decreased activity tolerance, Decreased balance, Decreased mobility, Decreased strength, Decreased cognition, Abnormal gait, Decreased safety awareness, Dizziness  Visit Diagnosis: Hemiplegia and hemiparesis following cerebral infarction affecting right non-dominant side (HCC)  Other lack of coordination  Muscle weakness (generalized)  Unsteadiness on feet     Problem List Patient Active Problem List   Diagnosis Date Noted   Healthcare maintenance 03/07/2021   Acute DVT (deep venous thrombosis) (HCC) 10/06/2020   Elevated liver function tests 02/05/2020   Pre-diabetes 02/03/2020   Subclinical hypothyroidism 02/03/2020   Tobacco use disorder 01/27/2020   Hyperlipemia 01/27/2020   Obesity 01/27/2020   History of CVA with residual deficit 01/26/2020    Hortencia Conradi, PTA  04/17/21, 1:15 PM   [facility] [address] [place], [place], [address] Phone: [phone]   Fax:  [phone]  Name: Kahlan Engebretson MRN: [id number] Date of Birth:  May 04, 1976

## 2021-04-20 ENCOUNTER — Ambulatory Visit: Payer: Medicaid Other | Admitting: Physical Therapy

## 2021-04-20 ENCOUNTER — Other Ambulatory Visit (HOSPITAL_COMMUNITY): Payer: Self-pay

## 2021-04-20 ENCOUNTER — Ambulatory Visit: Payer: Medicaid Other | Admitting: Occupational Therapy

## 2021-04-24 ENCOUNTER — Ambulatory Visit: Payer: Medicaid Other | Admitting: Physical Therapy

## 2021-04-24 ENCOUNTER — Ambulatory Visit: Payer: Medicaid Other | Admitting: Occupational Therapy

## 2021-04-24 ENCOUNTER — Encounter: Payer: Self-pay | Admitting: Physical Therapy

## 2021-04-24 ENCOUNTER — Other Ambulatory Visit: Payer: Self-pay

## 2021-04-24 DIAGNOSIS — R2681 Unsteadiness on feet: Secondary | ICD-10-CM

## 2021-04-24 DIAGNOSIS — M6281 Muscle weakness (generalized): Secondary | ICD-10-CM

## 2021-04-24 DIAGNOSIS — I69353 Hemiplegia and hemiparesis following cerebral infarction affecting right non-dominant side: Secondary | ICD-10-CM

## 2021-04-24 DIAGNOSIS — G8929 Other chronic pain: Secondary | ICD-10-CM

## 2021-04-24 DIAGNOSIS — R278 Other lack of coordination: Secondary | ICD-10-CM

## 2021-04-24 NOTE — Therapy (Addendum)
[facility] [address] [place], [place], [address] Phone: [phone]   Fax:  [phone]  Physical Therapy Treatment  Patient Details  Name: Maria Porter MRN: [id number] Date of Birth: 04-19-1976 Referring Provider (PT): Axel Filler, MD   Encounter Date: 04/24/2021   PT End of Session - 04/24/21 0850     Visit Number 5    Number of Visits 9    Date for PT Re-Evaluation 05/04/21    Authorization Type [employer] Medicaid - 27 visit limit, 3 used previously    Authorization Time Period 03/23/21-05/04/21    Authorization - Visit Number 27    Progress Note Due on Visit 10    PT Start Time 0848    PT Stop Time 0928    PT Time Calculation (min) 40 min    Equipment Utilized During Treatment Gait belt    Activity Tolerance Patient tolerated treatment well    Behavior During Therapy [facility] for tasks assessed/performed             Past Medical History:  Diagnosis Date   Stroke [facility])     Past Surgical History:  Procedure Laterality Date   BUBBLE STUDY  01/29/2020   Procedure: BUBBLE STUDY;  Surgeon: [name], MD;  Location: [place];  Service: Cardiovascular;;   TEE WITHOUT CARDIOVERSION N/A 01/29/2020   Procedure: TRANSESOPHAGEAL ECHOCARDIOGRAM (TEE);  Surgeon: [name], MD;  Location: [facility] ENDOSCOPY;  Service: Cardiovascular;  Laterality: N/A;    There were no vitals filed for this visit.   Subjective Assessment - 04/24/21 0850     Subjective Exercises are going well. No falls.    Pertinent History prior CVA, hypothyroidism, elevated BMI    How long can you sit comfortably? no issues    How long can you stand comfortably? No issues    How long can you walk comfortably? 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 No/denies    Pain Onset In the past 7 days                               [facility] Adult PT Treatment/Exercise - 04/24/21 0853       Transfers   Transfers Sit to Stand;Stand to Sit;Supine to Sit    Sit to Stand 5: Supervision    Five time sit to stand comments  22 seconds with BUE support from chair    Stand to Sit 5: Supervision    Comments 2 x 5 reps sit <> stands with no UE support, cues to scoot towards edge and incr forward lean. When sitting with no UE support cues to bend from legs and hinge from hips. During first couple reps pt with BLE bracing against mat, but improved with incr reps.      Ambulation/Gait   Ambulation/Gait Yes    Ambulation/Gait Assistance 5: Supervision    Ambulation/Gait Assistance Details Throughout session with activities.    Assistive device None    Gait Pattern Step-through pattern;Decreased weight shift to right    Ambulation Surface Level;Indoor    Stairs Yes    Stairs Assistance 5: Supervision    Stairs Assistance Details (  indicate cue type and reason) Pt reports does not have stairs at home, just has 3 steps at her daughter's house with a single handrail.    Stair Management Technique One rail Left;Step to pattern;Forwards    Number of Stairs 12    Height of Stairs 6      Knee/Hip Exercises: Aerobic   Stepper SciFit with BUE/BLE at gear 1.2 for 6 minutes for strengthening, activity tolerance, ROM.                 Balance Exercises - 04/24/21 0916       Balance Exercises: Standing   Standing Eyes Opened Foam/compliant surface;Limitations    Standing Eyes Opened Limitations On air ex, feet hip width x10 reps head turns, x10 reps head nods. Then performed same activity with more narrow BOS wit more postural sway.    SLS with Vectors Solid surface;Limitations    SLS with Vectors Limitations Standing on RLE x12 reps with tapping LLE to cone without UE support, cues for weight  shifting and standing tall on R.             Access Code: [email or web address] URL: [email or web address] Date: 04/24/2021 Prepared by: Janann August  Exercises Supine Bridge - 2 x daily - 5 x weekly - 1 sets - 10 reps Staggered Sit-to-Stand - 1 x daily - 5 x weekly - 1-2 sets - 5 reps Heel Raises with Counter Support - 2 x daily - 5 x weekly - 1-2 sets - 10 reps Standing Single Leg Stance with Counter Support - 2 x daily - 5 x weekly - 3 sets - 10 hold Standing Balance with Eyes Closed - 1 x daily - 5 x weekly - 3 sets - 10 hold   New addition on 04/24/21 Alternating Step Taps with Counter Support - 1 x daily - 5 x weekly - 2 sets - 10 reps - working on standing on RLE and tapping LLE into bottom cabinet in kitchen at [employer]   PT Education - 04/24/21 [phone]     Education Details Progress towards goals, added step taps at counter with working on SLS on RLE.    Person(s) Educated Patient    Methods Explanation;Handout    Comprehension Verbalized understanding              PT Short Term Goals - 04/24/21 0859       PT SHORT TERM GOAL #1   Title Pt will be independent with strengthening HEP    Baseline HFJF4F2 - pt performing exercises consistently at home.    Time 2    Period Weeks    Status Achieved    Target Date 04/13/21      PT SHORT TERM GOAL #2   Title Pt will have improved 5x STS to </=20 sec    Baseline 30.1 s with UE support, 22 seconds with BUE support on 04/24/21    Time 2    Period Weeks    Status Not Met    Target Date 04/13/21      PT SHORT TERM GOAL #3   Title Patient to negotiate full flight of stairs with most appropriate support and pattern.    Baseline 4 steps with L rail and step to pattern, 12 steps with L rail and step to pattern on 04/24/21    Time 2    Period Weeks    Status Achieved    Target Date 04/13/21  PT Long Term Goals - 03/23/21 1113       PT LONG TERM GOAL #1   Title Pt will be independent with  self progressing her strengthening HEP    Baseline HFJF4F2    Time 4    Period Weeks    Status New    Target Date 04/27/21      PT LONG TERM GOAL #2   Title Pt will have improved 5x STS </=13 sec to demo low fall risk    Baseline 30.1s    Time 4    Period Weeks    Status New    Target Date 04/27/21      PT LONG TERM GOAL #3   Title Pt will have improved TUG score of </=15 sec to demo low fall risk with no A/D    Baseline TUG score 27s w/o AD    Time 4    Period Weeks    Status New    Target Date 04/27/21      PT LONG TERM GOAL #4   Title Pt will have improved Berg Balance Score of at least 45/56 to demo low fall risk    Baseline Initial BERG score 29    Time 4    Period Weeks    Status New    Target Date 04/27/21      PT LONG TERM GOAL #5   Title Pt will be able to amb without a/d for 1035f' indoors and outdoors to demo improved community mobility    Baseline 3045fthrough clinic with walking stick and S    Time 4    Period Weeks    Status New    Target Date 04/27/21      Additional Long Term Goals   Additional Long Term Goals Yes      PT LONG TERM GOAL #6   Title Increase DGI score to 18/24    Baseline Initial DGI score 11/24    Time 4    Period Weeks    Status New    Target Date 04/27/21                   Plan - 04/24/21 1206     Clinical Impression Statement Checked pt's STGs today with pt meeting 2 out of 3 STGs in regards to stairs and performin HEP. Pt with improvement of 5x sit <> stand with BUE support to 22 seconds (previously 30.1 seconds), but not to goal level. Remainder of session focused on BLE strenghtening, and standing balance on unlevel surfaces/SLS. Pt challenged by SLS tasks on RLE without UE support. Will continue to progress towards LTGs.    Personal Factors and Comorbidities Age;Fitness;Comorbidity 1;Time since onset of injury/illness/exacerbation    Comorbidities prior CVA, hypothyroidism, elevated BMI    Examination-Activity  Limitations Locomotion Level;Transfers;Stand;Stairs;Squat;Carry;Lift;Bend    Examination-Participation Restrictions Cleaning;Occupation;Community Activity;Shop;Yard Work    [employer] officervolving/Moderate complexity    Rehab Potential Good    PT Frequency 2x / week    PT Duration 4 weeks   9 wks   PT Treatment/Interventions ADLs/Self Care Home Management;Aquatic Therapy;Moist Heat;DME Instruction;Gait training;Stair training;Functional mobility training;Therapeutic activities;Therapeutic exercise;Balance training;Neuromuscular re-education;Manual techniques;Patient/family education;Passive range of motion;Energy conservation    PT Next Visit Plan goals due next week with re-cert? balance with focus on SLS, RLE weight bearing, tandem, eyes closed, narrow BOS. RLE strength. NuStep/Scifit    PT Home Exercise Plan [email or web address]    Consulted and Agree with Plan of Care Patient  Patient will benefit from skilled therapeutic intervention in order to improve the following deficits and impairments:  Difficulty walking, Decreased activity tolerance, Decreased balance, Decreased mobility, Decreased strength, Decreased cognition, Abnormal gait, Decreased safety awareness, Dizziness  Visit Diagnosis: Hemiplegia and hemiparesis following cerebral infarction affecting right non-dominant side (HCC)  Other lack of coordination  Muscle weakness (generalized)  Unsteadiness on feet     Problem List Patient Active Problem List   Diagnosis Date Noted   Healthcare maintenance 03/07/2021   Acute DVT (deep venous thrombosis) ([place]) 10/06/2020   Elevated liver function tests 02/05/2020   Pre-diabetes 02/03/2020   Subclinical hypothyroidism 02/03/2020   Tobacco use disorder 01/27/2020   Hyperlipemia 01/27/2020   Obesity 01/27/2020   History of CVA with residual deficit 01/26/2020    Arliss Journey, PT, DPT  04/24/2021, 12:09 PM  [facility] [address] [place], [place], [address] Phone: [phone]   Fax:  [phone]  Name: Maria Porter MRN: [id number] Date of Birth: March 03, 1976

## 2021-04-24 NOTE — Patient Instructions (Signed)
Access Code: [email or web address] URL: [email or web address] Date: 04/24/2021 Prepared by: Sherlie Ban  Exercises Supine Bridge - 2 x daily - 5 x weekly - 1 sets - 10 reps Staggered Sit-to-Stand - 1 x daily - 5 x weekly - 1-2 sets - 5 reps Heel Raises with Counter Support - 2 x daily - 5 x weekly - 1-2 sets - 10 reps Standing Single Leg Stance with Counter Support - 2 x daily - 5 x weekly - 3 sets - 10 hold Standing Balance with Eyes Closed - 1 x daily - 5 x weekly - 3 sets - 10 hold   New addition on 04/24/21 Alternating Step Taps with Counter Support - 1 x daily - 5 x weekly - 2 sets - 10 reps

## 2021-04-24 NOTE — Patient Instructions (Signed)
Scapular Retraction (Prone)    Lie with arms at sides. Pinch shoulder blades together and towards your bottom (put them in back pockets). Top of shoulders should be relaxed Repeat _10___ times per set. Do __2__ sets per day.    Extension - Prone (Dumbbell)    Lie with right arm hanging off side of bed. Lift hand back and up. Thumb facing floor, NOT towards body. Back of shoulder should be doing work. Relax top of shoulder Repeat __10__ times per set. Do __2__ sets per day. NO weight   Shoulder Push-Up (Prone on Elbows)    With elbows placed under shoulders, rise up on elbows as high as possible. Keep hips and stomach on floor/bed and back arched. Hold __5__ seconds. Repeat __5__ times. Do __2__ sessions per day.  SHOULDER: Flexion - Supine (Cane)    Hold paper towel roll in both hands. Start at chest, then raise arms up towards ceiling, then overhead. Do not allow back to arch. Repeat _10__ reps per set, _2__ sets per day

## 2021-04-24 NOTE — Therapy (Signed)
[facility] [address] [place], [place], [address] Phone: [phone]   Fax:  [phone]  Occupational Therapy Treatment  Patient Details  Name: Maria Porter MRN: [id number] Date of Birth: 06-Jan-1976 Referring Provider (OT): Dr. Oswaldo Done   Encounter Date: 04/24/2021   OT End of Session - 04/24/21 1019     Visit Number 5    Number of Visits 25    Date for OT Re-Evaluation 06/21/21    Authorization Type [employer] Medicaid    OT Start Time 0932    OT Stop Time 1015    OT Time Calculation (min) 43 min    Activity Tolerance Patient tolerated treatment well    Behavior During Therapy [facility] for tasks assessed/performed             Past Medical History:  Diagnosis Date   Stroke [facility])     Past Surgical History:  Procedure Laterality Date   BUBBLE STUDY  01/29/2020   Procedure: BUBBLE STUDY;  Surgeon: Little Ishikawa, MD;  Location: [facility] ENDOSCOPY;  Service: Cardiovascular;;   TEE WITHOUT CARDIOVERSION N/A 01/29/2020   Procedure: TRANSESOPHAGEAL ECHOCARDIOGRAM (TEE);  Surgeon: Little Ishikawa, MD;  Location: [facility] ENDOSCOPY;  Service: Cardiovascular;  Laterality: N/A;    There were no vitals filed for this visit.   Subjective Assessment - 04/24/21 0936     Subjective  The shoulder ex's you gave me are helping. I was able to open 2 bags of chips    Pertinent History CVA      PMH:  hx of prior CVA (approx 4 yrs ago affecting L side, 01/2020 affecting R side, and 09/11/20 affecting R side); obesity, hyperlipidemia, sleep apnea    Patient Stated Goals get back to using my Rt arm, improve ability to do crafts    Currently in Pain? No/denies    Pain Onset In the past 7 days             Focused on scapula retraction and depression with exercises in prone and wt bearing on elbows - tactile and verbal cues to prevent shoulder hiking/tensing. Pt able to return demo after initial cueing Supine: BUE sh flexion  from chest press w/ focus on normal scapulohumeral rhythm - issued all as HEP (see pt instructions for details)  Pt improving w/ ROM supine, however unable to carryover into seated position.  Encouraged pt to discuss with doctor as she reports seeing MD this week                    OT Education - 04/24/21 1006     Education Details Additional shoulder HEP for scapula stabilization    Person(s) Educated Patient    Methods Explanation;Demonstration;Handout    Comprehension Verbalized understanding;Returned demonstration              OT Short Term Goals - 04/10/21 1107       OT SHORT TERM GOAL #1   Title Pt will be independent with initial HEP.    Baseline no HEP    Time 4    Period Weeks    Status On-going      OT SHORT TERM GOAL #2   Title Pt will demo at least 85* R shoulder flex for functional reach.    Baseline 60-70*    Time 4    Period Weeks    Status On-going      OT SHORT TERM GOAL #3   Title Pt will improve R  hand coordination for ADLs/IADLs as shown by improving score on box and blocks test by at least 10 blocks.    Baseline 27 blocks    Time 4    Period Weeks    Status On-going      OT SHORT TERM GOAL #4   Title Pt will improve R hand grip strength to at least 15 lbs to assist in opening bottles/containers.    Baseline 6.1 lbs    Time 4    Period Weeks    Status On-going               OT Long Term Goals - 03/23/21 1204       OT LONG TERM GOAL #1   Title Pt will verbalize understanding of AE/strategies for incr safety/independence with ADLs/IADLs  (including cognitive and visual strategies prn, and for opening jars/medicine bottles)    Baseline dependent, needs education    Time 12    Period Weeks    Status New      OT LONG TERM GOAL #2   Title Pt will demo at least 90* R shoulder flex for functional reach.    Baseline 60-70*    Time 12    Period Weeks    Status New      OT LONG TERM GOAL #3   Title Pt will improve R  hand coordination for ADLs/IADLs and leisure tasks as shown by completing 9-hole peg test in 30 sec or less.    Baseline 35.94 sec    Time 12    Period Weeks    Status New      OT LONG TERM GOAL #4   Title Pt will improve R hand grip strength to at least 20 lbs to assist in opening bottles/containers.    Baseline 6.1 lbs    Time 12    Period Weeks    Status New      OT LONG TERM GOAL #5   Title Pt will tolerate shoulder P/ROM to 110* w/ pain less than or equal to 3/10    Baseline only to 85/90*    Time 12    Period Weeks    Status New                   Plan - 04/24/21 1010     Clinical Impression Statement Pt progressing with improved shoulder pain with increased ROM supine today.    OT Occupational Profile and History Detailed Assessment- Review of Records and additional review of physical, cognitive, psychosocial history related to current functional performance    Occupational performance deficits (Please refer to evaluation for details): ADL's;IADL's;Leisure    Body Structure / Function / Physical Skills ADL;Decreased knowledge of use of DME;Strength;Dexterity;Balance;UE functional use;Proprioception;ROM;IADL;Vision;Sensation;Mobility;Coordination;FMC    Cognitive Skills Attention;Memory;Problem Solve;Sequencing;Safety Awareness    Rehab Potential Good    Clinical Decision Making Several treatment options, min-mod task modification necessary    Comorbidities Affecting Occupational Performance: May have comorbidities impacting occupational performance    Modification or Assistance to Complete Evaluation  Min-Moderate modification of tasks or assist with assess necessary to complete eval    OT Frequency 2x / week    OT Duration 12 weeks   PLUS EVAL (anticipate only 8 weeks)   OT Treatment/Interventions Self-care/ADL training;Moist Heat;Fluidtherapy;Splinting;Balance training;Therapeutic activities;Therapeutic exercise;Ultrasound;Cognitive  remediation/compensation;Visual/perceptual remediation/compensation;Passive range of motion;Functional Mobility Training;Neuromuscular education;Cryotherapy;Energy conservation;Manual Therapy;Patient/family education;Paraffin;DME and/or AE instruction;Coping strategies training    Plan review shoulder HEP prn, continue coordination and hand strengthening Rt hand  Recommended Other Services Speech therapy - to schedule beginning in January when visits start over    Consulted and Agree with Plan of Care Patient             Patient will benefit from skilled therapeutic intervention in order to improve the following deficits and impairments:   Body Structure / Function / Physical Skills: ADL, Decreased knowledge of use of DME, Strength, Dexterity, Balance, UE functional use, Proprioception, ROM, IADL, Vision, Sensation, Mobility, Coordination, [facility] Cognitive Skills: Attention, Memory, Problem Solve, Sequencing, Safety Awareness     Visit Diagnosis: Hemiplegia and hemiparesis following cerebral infarction affecting right non-dominant side (HCC)  Chronic right shoulder pain  Muscle weakness (generalized)    Problem List Patient Active Problem List   Diagnosis Date Noted   Healthcare maintenance 03/07/2021   Acute DVT (deep venous thrombosis) (HCC) 10/06/2020   Elevated liver function tests 02/05/2020   Pre-diabetes 02/03/2020   Subclinical hypothyroidism 02/03/2020   Tobacco use disorder 01/27/2020   Hyperlipemia 01/27/2020   Obesity 01/27/2020   History of CVA with residual deficit 01/26/2020    Kelli Churn, OTR/L 04/24/2021, 10:20 AM  [facility] [address] [place], [place], [address] Phone: [phone]   Fax:  [phone]  Name: Maria Porter MRN: [id number] Date of Birth: 1976-03-21

## 2021-04-27 ENCOUNTER — Encounter: Payer: Self-pay | Admitting: Occupational Therapy

## 2021-04-27 ENCOUNTER — Ambulatory Visit: Payer: Medicaid Other | Admitting: Occupational Therapy

## 2021-04-27 ENCOUNTER — Ambulatory Visit: Payer: Medicaid Other | Admitting: Physical Therapy

## 2021-04-27 ENCOUNTER — Encounter: Payer: Self-pay | Admitting: Physical Therapy

## 2021-04-27 ENCOUNTER — Ambulatory Visit: Payer: Medicaid Other

## 2021-04-27 ENCOUNTER — Other Ambulatory Visit: Payer: Self-pay

## 2021-04-27 DIAGNOSIS — R2689 Other abnormalities of gait and mobility: Secondary | ICD-10-CM

## 2021-04-27 DIAGNOSIS — R278 Other lack of coordination: Secondary | ICD-10-CM

## 2021-04-27 DIAGNOSIS — G8929 Other chronic pain: Secondary | ICD-10-CM

## 2021-04-27 DIAGNOSIS — I69353 Hemiplegia and hemiparesis following cerebral infarction affecting right non-dominant side: Secondary | ICD-10-CM

## 2021-04-27 DIAGNOSIS — M6281 Muscle weakness (generalized): Secondary | ICD-10-CM

## 2021-04-27 DIAGNOSIS — R41844 Frontal lobe and executive function deficit: Secondary | ICD-10-CM

## 2021-04-27 DIAGNOSIS — R2681 Unsteadiness on feet: Secondary | ICD-10-CM

## 2021-04-27 DIAGNOSIS — M25611 Stiffness of right shoulder, not elsewhere classified: Secondary | ICD-10-CM

## 2021-04-27 DIAGNOSIS — I69319 Unspecified symptoms and signs involving cognitive functions following cerebral infarction: Secondary | ICD-10-CM

## 2021-04-27 DIAGNOSIS — R41842 Visuospatial deficit: Secondary | ICD-10-CM

## 2021-04-27 DIAGNOSIS — R41840 Attention and concentration deficit: Secondary | ICD-10-CM

## 2021-04-27 NOTE — Therapy (Signed)
[facility] Out[facility] Rehabilitation [facility] [address] [place], [place], [address] Phone: [phone]   Fax:  [phone]  Occupational Therapy Treatment  Patient Details  Name: Maria Porter MRN: [id number] Date of Birth: 05/31/1975 Referring Provider (OT): Dr. Oswaldo Done   Encounter Date: 04/27/2021   OT End of Session - 04/27/21 1021     Visit Number 6    Number of Visits 25    Date for OT Re-Evaluation 06/21/21    Authorization Type [employer] Medicaid    OT Start Time 1020    OT Stop Time 1100    OT Time Calculation (min) 40 min    Activity Tolerance Patient tolerated treatment well    Behavior During Therapy [facility] for tasks assessed/performed             Past Medical History:  Diagnosis Date   Stroke [facility])     Past Surgical History:  Procedure Laterality Date   BUBBLE STUDY  01/29/2020   Procedure: BUBBLE STUDY;  Surgeon: Little Ishikawa, MD;  Location: [facility] ENDOSCOPY;  Service: Cardiovascular;;   TEE WITHOUT CARDIOVERSION N/A 01/29/2020   Procedure: TRANSESOPHAGEAL ECHOCARDIOGRAM (TEE);  Surgeon: Little Ishikawa, MD;  Location: [facility] ENDOSCOPY;  Service: Cardiovascular;  Laterality: N/A;    There were no vitals filed for this visit.   Subjective Assessment - 04/27/21 1020     Subjective  I woke up without pain.  "The shoulder is better"  "I'm excited that I'm doing better"    Pertinent History CVA      PMH:  hx of prior CVA (approx 4 yrs ago affecting L side, 01/2020 affecting R side, and 09/11/20 affecting R side); obesity, hyperlipidemia, sleep apnea    Patient Stated Goals get back to using my Rt arm, improve ability to do crafts    Currently in Pain? No/denies    Pain Onset In the past 7 days              Checked STGs and discussed progress--see below.  Picking up blocks using gripper level 1/black spring for sustained grip strength with min difficulty.  Placing grooved pegs in pegboard with R hand with min  difficulty/incr time.   Prone:  shoulder ext off edge of mat, scapular retraction with BUEs with shoulders positioned in ext, wt. Bearing on elbows with head/chest lift for incr scapular depression with min cueing.  Supine:  closed-chain shoulder flex and chest press with foam noodle with BUEs with min v.c.  Sitting:  closed-chain shoulder flex with foam noodle (floor>mid-range), low range elbow extension/shoulder flex along lap with BUEs with foam noodle.  Wt. Bearing through RUE with hand on mat with body on arm movements with min cues.          OT Short Term Goals - 04/27/21 1022       OT SHORT TERM GOAL #1   Title Pt will be independent with initial HEP.    Baseline no HEP    Time 4    Period Weeks    Status Achieved      OT SHORT TERM GOAL #2   Title Pt will demo at least 85* R shoulder flex for functional reach.    Baseline 60-70*    Time 4    Period Weeks    Status Achieved   04/27/21:  85* with 1-2/10 pain     OT SHORT TERM GOAL #3   Title Pt will improve R hand coordination for ADLs/IADLs as shown by improving  score on box and blocks test by at least 10 blocks.    Baseline 27 blocks    Time 4    Period Weeks    Status On-going   04/27/21:  33 blocks     OT SHORT TERM GOAL #4   Title Pt will improve R hand grip strength to at least 15 lbs to assist in opening bottles/containers.    Baseline 6.1 lbs    Time 4    Period Weeks    Status On-going   04/27/21:  9.9lbs              OT Long Term Goals - 03/23/21 1204       OT LONG TERM GOAL #1   Title Pt will verbalize understanding of AE/strategies for incr safety/independence with ADLs/IADLs  (including cognitive and visual strategies prn, and for opening jars/medicine bottles)    Baseline dependent, needs education    Time 12    Period Weeks    Status New      OT LONG TERM GOAL #2   Title Pt will demo at least 90* R shoulder flex for functional reach.    Baseline 60-70*    Time 12    Period Weeks     Status New      OT LONG TERM GOAL #3   Title Pt will improve R hand coordination for ADLs/IADLs and leisure tasks as shown by completing 9-hole peg test in 30 sec or less.    Baseline 35.94 sec    Time 12    Period Weeks    Status New      OT LONG TERM GOAL #4   Title Pt will improve R hand grip strength to at least 20 lbs to assist in opening bottles/containers.    Baseline 6.1 lbs    Time 12    Period Weeks    Status New      OT LONG TERM GOAL #5   Title Pt will tolerate shoulder P/ROM to 110* w/ pain less than or equal to 3/10    Baseline only to 85/90*    Time 12    Period Weeks    Status New                   Plan - 04/27/21 1021     Clinical Impression Statement Pt progressing with improved shoulder pain, incr ROM, and improved coordination.    OT Occupational Profile and History Detailed Assessment- Review of Records and additional review of physical, cognitive, psychosocial history related to current functional performance    Occupational performance deficits (Please refer to evaluation for details): ADL's;IADL's;Leisure    Body Structure / Function / Physical Skills ADL;Decreased knowledge of use of DME;Strength;Dexterity;Balance;UE functional use;Proprioception;ROM;IADL;Vision;Sensation;Mobility;Coordination;FMC    Cognitive Skills Attention;Memory;Problem Solve;Sequencing;Safety Awareness    Rehab Potential Good    Clinical Decision Making Several treatment options, min-mod task modification necessary    Comorbidities Affecting Occupational Performance: May have comorbidities impacting occupational performance    Modification or Assistance to Complete Evaluation  Min-Moderate modification of tasks or assist with assess necessary to complete eval    OT Frequency 2x / week    OT Duration 12 weeks   PLUS EVAL (anticipate only 8 weeks)   OT Treatment/Interventions Self-care/ADL training;Moist Heat;Fluidtherapy;Splinting;Balance training;Therapeutic  activities;Therapeutic exercise;Ultrasound;Cognitive remediation/compensation;Visual/perceptual remediation/compensation;Passive range of motion;Functional Mobility Training;Neuromuscular education;Cryotherapy;Energy conservation;Manual Therapy;Patient/family education;Paraffin;DME and/or AE instruction;Coping strategies training    Plan continue coordination and hand strengthening Rt hand and neuro-red ed R  shoulder    Recommended Other Services Speech therapy - to schedule beginning in January when visits start over    Consulted and Agree with Plan of Care Patient             Patient will benefit from skilled therapeutic intervention in order to improve the following deficits and impairments:   Body Structure / Function / Physical Skills: ADL, Decreased knowledge of use of DME, Strength, Dexterity, Balance, UE functional use, Proprioception, ROM, IADL, Vision, Sensation, Mobility, Coordination, [facility] Cognitive Skills: Attention, Memory, Problem Solve, Sequencing, Safety Awareness     Visit Diagnosis: Hemiplegia and hemiparesis following cerebral infarction affecting right non-dominant side (HCC)  Chronic right shoulder pain  Other lack of coordination  Attention and concentration deficit  Frontal lobe and executive function deficit  Stiffness of right shoulder, not elsewhere classified  Visuospatial deficit  Unspecified symptoms and signs involving cognitive functions following cerebral infarction    Problem List Patient Active Problem List   Diagnosis Date Noted   Healthcare maintenance 03/07/2021   Acute DVT (deep venous thrombosis) (HCC) 10/06/2020   Elevated liver function tests 02/05/2020   Pre-diabetes 02/03/2020   Subclinical hypothyroidism 02/03/2020   Tobacco use disorder 01/27/2020   Hyperlipemia 01/27/2020   Obesity 01/27/2020   History of CVA with residual deficit 01/26/2020    [facility], OT 04/27/2021, 1:02 PM  [facility] [address] [place], [place], [address] Phone: [phone]   Fax:  [phone]  Name: Maria Porter MRN: [id number] Date of Birth: 06-17-1975  Willa Frater, OTR/L [facility] [address] [place], [place]  [address] [phone] phone [phone] 04/27/21 1:02 PM

## 2021-04-27 NOTE — Therapy (Addendum)
[facility] [address] [place], [place], [address] Phone: [phone]   Fax:  [phone]  Physical Therapy Treatment  Patient Details  Name: Maria Porter MRN: [id number] Date of Birth: [birth year] Referring Provider (PT): Tyson Alias, MD   Encounter Date: 04/27/2021   PT End of Session - 04/27/21 0937     Visit Number 6    Number of Visits 9    Date for PT Re-Evaluation 05/04/21    Authorization Type U[employer] Medicaid - 27 visit limit, 3 used previously    Authorization Time Period 03/23/21-05/04/21    Authorization - Visit Number 5    Authorization - Number of Visits 27    Progress Note Due on Visit 10    PT Start Time 0934    PT Stop Time 1014    PT Time Calculation (min) 40 min    Equipment Utilized During Treatment Gait belt    Activity Tolerance Patient tolerated treatment well    Behavior During Therapy [facility] for tasks assessed/performed             Past Medical History:  Diagnosis Date   Stroke [facility])     Past Surgical History:  Procedure Laterality Date   BUBBLE STUDY  01/29/2020   Procedure: BUBBLE STUDY;  Surgeon: Little Ishikawa, MD;  Location: [facility] ENDOSCOPY;  Service: Cardiovascular;;   TEE WITHOUT CARDIOVERSION N/A 01/29/2020   Procedure: TRANSESOPHAGEAL ECHOCARDIOGRAM (TEE);  Surgeon: Little Ishikawa, MD;  Location: [facility] ENDOSCOPY;  Service: Cardiovascular;  Laterality: N/A;    There were no vitals filed for this visit.       [facility] PT Assessment - 04/27/21 0940       Berg Balance Test   Sit to Stand Able to stand without using hands and stabilize independently    Standing Unsupported Able to stand safely 2 minutes    Sitting with Back Unsupported but Feet Supported on Floor or Stool Able to sit safely and securely 2 minutes    Stand to Sit Sits safely with minimal use of hands    Transfers Able to transfer safely, minor use of hands    Standing Unsupported  with Eyes Closed Able to stand 10 seconds safely    Standing Unsupported with Feet Together Able to place feet together independently and stand 1 minute safely    From Standing, Reach Forward with Outstretched Arm Can reach confidently >25 cm (10")    From Standing Position, Pick up Object from Floor Able to pick up shoe, needs supervision    From Standing Position, Turn to Look Behind Over each Shoulder Looks behind from both sides and weight shifts well    Turn 360 Degrees Able to turn 360 degrees safely but slowly   13.4 to L, 10.6 to R   Standing Unsupported, Alternately Place Feet on Step/Stool Able to stand independently and complete 8 steps >20 seconds   21.2 seconds   Standing Unsupported, One Foot in Front Able to take small step independently and hold 30 seconds    Standing on One Leg Tries to lift leg/unable to hold 3 seconds but remains standing independently    Total Score 47    Berg comment: moderate fall risk      Timed Up and Go Test   Normal TUG (seconds) 16.21   no AD                    05/01/21 1437  Therapeutic Activites  Therapeutic Activities Other Therapeutic Activities  Other Therapeutic Activities Discussed POC going forwards and visit limit with Medicaid as pt's last scheduled visit is next week. Discussed to conserve visits in the new year (and pt will be starting speech therapy in January), will plan for an additional 1x week for 4 weeks, pt in agreement with plan               Balance Exercises - 04/27/21 1010       Balance Exercises: Standing   Tandem Gait 3 reps;Limitations    Tandem Gait Limitations down and back x3 reps with UE support at countertop    Retro Gait 3 reps;Limitations    Retro Gait Limitations Down and back x3 reps, cues to keep a soft knee and for weight shift, performed with no UE support   Step Over Hurdles / Cones Stepping over 4 smaller orange obstacles: with step to pattern working on no UE support, first with  stance on LLE and then with RLE, down and back x4-5 reps. Incr difficulty with stance on RLE.                PT Education - 04/27/21 1052     Education Details Progress towards goals, see therapeutic activity section.    Person(s) Educated Patient    Methods Explanation    Comprehension Verbalized understanding              PT Short Term Goals - 04/24/21 0859       PT SHORT TERM GOAL #1   Title Pt will be independent with strengthening HEP    Baseline HFJF4F2 - pt performing exercises consistently at home.    Time 2    Period Weeks    Status Achieved    Target Date 04/13/21      PT SHORT TERM GOAL #2   Title Pt will have improved 5x STS to </=20 sec    Baseline 30.1 s with UE support, 22 seconds with BUE support on 04/24/21    Time 2    Period Weeks    Status Not Met    Target Date 04/13/21      PT SHORT TERM GOAL #3   Title Patient to negotiate full flight of stairs with most appropriate support and pattern.    Baseline 4 steps with L rail and step to pattern, 12 steps with L rail and step to pattern on 04/24/21    Time 2    Period Weeks    Status Achieved    Target Date 04/13/21               PT Long Term Goals - 04/27/21 0946       PT LONG TERM GOAL #1   Title Pt will be independent with self progressing her strengthening HEP    Baseline HFJF4F2; pt reports consistency with her HEP    Time 4    Period Weeks    Status Achieved    Target Date 04/27/21      PT LONG TERM GOAL #2   Title Pt will have improved 5x STS </=13 sec to demo low fall risk    Baseline 30.1s; 22 seconds with BUE support on 04/24/21    Time 4    Period Weeks    Status Not Met    Target Date 04/27/21      PT LONG TERM GOAL #3   Title Pt will have improved TUG score of </=15 sec to demo  low fall risk with no A/D    Baseline TUG score 27s w/o AD; 16.21 seconds no AD    Time 4    Period Weeks    Status Not Met    Target Date 04/27/21      PT LONG TERM GOAL #4    Title Pt will have improved Berg Balance Score of at least 45/56 to demo low fall risk    Baseline Initial BERG score 29; 47/56 on 04/27/21    Time 4    Period Weeks    Status Achieved    Target Date 04/27/21      PT LONG TERM GOAL #5   Title Pt will be able to amb without a/d for 1062ft' indoors and outdoors to demo improved community mobility    Baseline 317ft through clinic with walking stick and S    Time 4    Period Weeks    Status New    Target Date 04/27/21      PT LONG TERM GOAL #6   Title Increase DGI score to 18/24    Baseline Initial DGI score 11/24    Time 4    Period Weeks    Status New    Target Date 04/27/21                 05/01/21 1439  Plan  Clinical Impression Statement Began to check pt's LTGs today. Pt met LTG #1 - has been performing HEP at home consistently. Pt met LTG #4 in regards to BERG, scored a 47/56 today (initial score at eval was 29/56), indicating pt is now a moderate fall risk. Pt improved her TUG time to 16.2 seconds with no AD, but not quite to goal level (previously 27 seconds), still putting pt at a fall risk. Will assess remainder of LTGs at next session and will plan to re-cert for an additional 4 visits into next year to help conserve Medicaid visits, as pt will be starting speech therapy in Jan 2023. Pt in agreement with plan.  Personal Factors and Comorbidities Age;Fitness;Comorbidity 1;Time since onset of injury/illness/exacerbation  Comorbidities prior CVA, hypothyroidism, elevated BMI  Examination-Activity Limitations Locomotion Level;Transfers;Stand;Stairs;Squat;Carry;Lift;Bend  Examination-Participation Restrictions Cleaning;Occupation;Community Activity;Shop;Yard Work  Pt will benefit from skilled therapeutic intervention in order to improve on the following deficits Difficulty walking;Decreased activity tolerance;Decreased balance;Decreased mobility;Decreased strength;Decreased cognition;Abnormal gait;Decreased safety  awareness;Dizziness  Stability/Clinical Decision Making Evolving/Moderate complexity  Rehab Potential Good  PT Frequency 2x / week  PT Duration 4 weeks (9 wks)  PT Treatment/Interventions ADLs/Self Care Home Management;Aquatic Therapy;Moist Heat;DME Instruction;Gait training;Stair training;Functional mobility training;Therapeutic activities;Therapeutic exercise;Balance training;Neuromuscular re-education;Manual techniques;Patient/family education;Passive range of motion;Energy conservation  PT Next Visit Plan check remainder of goals, plan for re-cert for 1x week for 4 weeks to help conserve visits.  balance with focus on SLS, RLE weight bearing, tandem, eyes closed, narrow BOS. RLE strength. NuStep/Scifit  PT Home Exercise Plan [id number]  Consulted and Agree with Plan of Care Patient       Patient will benefit from skilled therapeutic intervention in order to improve the following deficits and impairments:     Visit Diagnosis: Hemiplegia and hemiparesis following cerebral infarction affecting right non-dominant side (HCC)  Unsteadiness on feet  Muscle weakness (generalized)  Other abnormalities of gait and mobility     Problem List Patient Active Problem List   Diagnosis Date Noted   Healthcare maintenance 03/07/2021   Acute DVT (deep venous thrombosis) (HCC) 10/06/2020   Elevated liver function tests 02/05/2020  Pre-diabetes 02/03/2020   Subclinical hypothyroidism 02/03/2020   Tobacco use disorder 01/27/2020   Hyperlipemia 01/27/2020   Obesity 01/27/2020   History of CVA with residual deficit 01/26/2020    Arliss Journey, PT, DPT  04/27/2021, 10:53 AM  [facility] [address] [place], [place], [address] Phone: [phone]   Fax:  [phone]  Name: Maria Porter MRN: [id number] Date of Birth: [birth year]

## 2021-05-02 ENCOUNTER — Encounter: Payer: Self-pay | Admitting: Occupational Therapy

## 2021-05-02 ENCOUNTER — Other Ambulatory Visit: Payer: Self-pay

## 2021-05-02 ENCOUNTER — Ambulatory Visit: Payer: Medicaid Other | Admitting: Occupational Therapy

## 2021-05-02 ENCOUNTER — Ambulatory Visit: Payer: Medicaid Other

## 2021-05-02 DIAGNOSIS — R41840 Attention and concentration deficit: Secondary | ICD-10-CM

## 2021-05-02 DIAGNOSIS — M25611 Stiffness of right shoulder, not elsewhere classified: Secondary | ICD-10-CM

## 2021-05-02 DIAGNOSIS — R41844 Frontal lobe and executive function deficit: Secondary | ICD-10-CM

## 2021-05-02 DIAGNOSIS — R278 Other lack of coordination: Secondary | ICD-10-CM

## 2021-05-02 DIAGNOSIS — M6281 Muscle weakness (generalized): Secondary | ICD-10-CM | POA: Diagnosis not present

## 2021-05-02 DIAGNOSIS — R2681 Unsteadiness on feet: Secondary | ICD-10-CM

## 2021-05-02 DIAGNOSIS — M25511 Pain in right shoulder: Secondary | ICD-10-CM

## 2021-05-02 DIAGNOSIS — I69353 Hemiplegia and hemiparesis following cerebral infarction affecting right non-dominant side: Secondary | ICD-10-CM

## 2021-05-02 DIAGNOSIS — I69319 Unspecified symptoms and signs involving cognitive functions following cerebral infarction: Secondary | ICD-10-CM

## 2021-05-02 DIAGNOSIS — G8929 Other chronic pain: Secondary | ICD-10-CM

## 2021-05-02 DIAGNOSIS — R41842 Visuospatial deficit: Secondary | ICD-10-CM

## 2021-05-02 NOTE — Therapy (Signed)
[facility] [address] [place], [place], [address] Phone: [phone]   Fax:  [phone]  Occupational Therapy Treatment  Patient Details  Name: Maria Porter MRN: [id number] Date of Birth: January 02, 1976 Referring Provider (OT): Dr. Oswaldo Done   Encounter Date: 05/02/2021   OT End of Session - 05/02/21 1056     Visit Number 7    Number of Visits 25    Date for OT Re-Evaluation 06/21/21    Authorization Type UHC Medicaid    OT Start Time 1100    OT Stop Time 1145    OT Time Calculation (min) 45 min    Activity Tolerance Patient tolerated treatment well    Behavior During Therapy [facility] for tasks assessed/performed             Past Medical History:  Diagnosis Date   Stroke [facility])     Past Surgical History:  Procedure Laterality Date   BUBBLE STUDY  01/29/2020   Procedure: BUBBLE STUDY;  Surgeon: Little Ishikawa, MD;  Location: [facility] ENDOSCOPY;  Service: Cardiovascular;;   TEE WITHOUT CARDIOVERSION N/A 01/29/2020   Procedure: TRANSESOPHAGEAL ECHOCARDIOGRAM (TEE);  Surgeon: Little Ishikawa, MD;  Location: [facility] ENDOSCOPY;  Service: Cardiovascular;  Laterality: N/A;    There were no vitals filed for this visit.   Subjective Assessment - 05/02/21 1056     Pertinent History CVA      PMH:  hx of prior CVA (approx 4 yrs ago affecting L side, 01/2020 affecting R side, and 09/11/20 affecting R side); obesity, hyperlipidemia, sleep apnea    Patient Stated Goals get back to using my Rt arm, improve ability to do crafts    Currently in Pain? No/denies    Pain Onset In the past 7 days             Supine, gentle joint mobs/stretch to R shoulder.  Supine, scapular retraction and depression AROM with BUEs with min cueing.  Self PROM shoulder flex stretch.  Closed-chain shoulder flex and chest press with min cueing/facilitation for scapula movement.  Prone, wt. Bearing on elbows with chest/head lift for  scapular depression, shoulder ext, and scapular retraction with min cueing.    Quadruped, forward/backward wt. Shifts, cat/cow positions for incr scapular stability with min cueing.   Standing, shoulder flex closed-chain with UE ranger with min cueing/facilitation for scapular depression.    Wt. Bearing through R hand with body on arm movement with min cueing for shoulder depression.        OT Short Term Goals - 04/27/21 1022       OT SHORT TERM GOAL #1   Title Pt will be independent with initial HEP.    Baseline no HEP    Time 4    Period Weeks    Status Achieved      OT SHORT TERM GOAL #2   Title Pt will demo at least 85* R shoulder flex for functional reach.    Baseline 60-70*    Time 4    Period Weeks    Status Achieved   04/27/21:  85* with 1-2/10 pain     OT SHORT TERM GOAL #3   Title Pt will improve R hand coordination for ADLs/IADLs as shown by improving score on box and blocks test by at least 10 blocks.    Baseline 27 blocks    Time 4    Period Weeks    Status On-going   04/27/21:  33 blocks  OT SHORT TERM GOAL #4   Title Pt will improve R hand grip strength to at least 15 lbs to assist in opening bottles/containers.    Baseline 6.1 lbs    Time 4    Period Weeks    Status On-going   04/27/21:  9.9lbs              OT Long Term Goals - 03/23/21 1204       OT LONG TERM GOAL #1   Title Pt will verbalize understanding of AE/strategies for incr safety/independence with ADLs/IADLs  (including cognitive and visual strategies prn, and for opening jars/medicine bottles)    Baseline dependent, needs education    Time 12    Period Weeks    Status New      OT LONG TERM GOAL #2   Title Pt will demo at least 90* R shoulder flex for functional reach.    Baseline 60-70*    Time 12    Period Weeks    Status New      OT LONG TERM GOAL #3   Title Pt will improve R hand coordination for ADLs/IADLs and leisure tasks as shown by completing 9-hole peg test in  30 sec or less.    Baseline 35.94 sec    Time 12    Period Weeks    Status New      OT LONG TERM GOAL #4   Title Pt will improve R hand grip strength to at least 20 lbs to assist in opening bottles/containers.    Baseline 6.1 lbs    Time 12    Period Weeks    Status New      OT LONG TERM GOAL #5   Title Pt will tolerate shoulder P/ROM to 110* w/ pain less than or equal to 3/10    Baseline only to 85/90*    Time 12    Period Weeks    Status New                   Plan - 05/02/21 1056     Clinical Impression Statement Pt progressing with improved shoulder pain, incr ROM, and improved coordination.    OT Occupational Profile and History Detailed Assessment- Review of Records and additional review of physical, cognitive, psychosocial history related to current functional performance    Occupational performance deficits (Please refer to evaluation for details): ADL's;IADL's;Leisure    Body Structure / Function / Physical Skills ADL;Decreased knowledge of use of DME;Strength;Dexterity;Balance;UE functional use;Proprioception;ROM;IADL;Vision;Sensation;Mobility;Coordination;FMC    Cognitive Skills Attention;Memory;Problem Solve;Sequencing;Safety Awareness    Rehab Potential Good    Clinical Decision Making Several treatment options, min-mod task modification necessary    Comorbidities Affecting Occupational Performance: May have comorbidities impacting occupational performance    Modification or Assistance to Complete Evaluation  Min-Moderate modification of tasks or assist with assess necessary to complete eval    OT Frequency 2x / week    OT Duration 12 weeks   PLUS EVAL (anticipate only 8 weeks)   OT Treatment/Interventions Self-care/ADL training;Moist Heat;Fluidtherapy;Splinting;Balance training;Therapeutic activities;Therapeutic exercise;Ultrasound;Cognitive remediation/compensation;Visual/perceptual remediation/compensation;Passive range of motion;Functional Mobility  Training;Neuromuscular education;Cryotherapy;Energy conservation;Manual Therapy;Patient/family education;Paraffin;DME and/or AE instruction;Coping strategies training    Plan continue coordination and hand strengthening Rt hand and neuro-red ed R shoulder, ?add quadruped to HEP    Recommended Other Services Speech therapy - to schedule beginning in January when visits start over    Consulted and Agree with Plan of Care Patient  Patient will benefit from skilled therapeutic intervention in order to improve the following deficits and impairments:   Body Structure / Function / Physical Skills: ADL, Decreased knowledge of use of DME, Strength, Dexterity, Balance, UE functional use, Proprioception, ROM, IADL, Vision, Sensation, Mobility, Coordination, [facility] Cognitive Skills: Attention, Memory, Problem Solve, Sequencing, Safety Awareness     Visit Diagnosis: Hemiplegia and hemiparesis following cerebral infarction affecting right non-dominant side (HCC)  Unsteadiness on feet  Chronic right shoulder pain  Other lack of coordination  Attention and concentration deficit  Frontal lobe and executive function deficit  Stiffness of right shoulder, not elsewhere classified  Visuospatial deficit  Unspecified symptoms and signs involving cognitive functions following cerebral infarction    Problem List Patient Active Problem List   Diagnosis Date Noted   Healthcare maintenance 03/07/2021   Acute DVT (deep venous thrombosis) (HCC) 10/06/2020   Elevated liver function tests 02/05/2020   Pre-diabetes 02/03/2020   Subclinical hypothyroidism 02/03/2020   Tobacco use disorder 01/27/2020   Hyperlipemia 01/27/2020   Obesity 01/27/2020   History of CVA with residual deficit 01/26/2020    [facility], OT 05/02/2021, 12:34 PM  [facility] [address] [place], [place], [address] Phone: [phone]   Fax:   [phone]  Name: Maria Porter MRN: [id number] Date of Birth: 08/26/1975   Willa Frater, OTR/L [facility] [address] [place], [place]  [address] [phone] phone [phone] 05/02/21 12:34 PM

## 2021-05-03 ENCOUNTER — Ambulatory Visit: Payer: Medicaid Other | Admitting: Behavioral Health

## 2021-05-03 DIAGNOSIS — F331 Major depressive disorder, recurrent, moderate: Secondary | ICD-10-CM

## 2021-05-03 DIAGNOSIS — F419 Anxiety disorder, unspecified: Secondary | ICD-10-CM

## 2021-05-03 NOTE — BH Specialist Note (Signed)
Integrated Behavioral Health via Telemedicine Visit  05/03/2021 Maria Porter [id number]  Number of Integrated Behavioral Health visits: 2/6 Session Start time: 9:00am  Session End time: 9:40am Total time: 40   Referring Provider: Dr. Charlotta Newton, DO Patient/Family location: Pt in private @ home [facility] Provider location: [facility] Office All persons participating in visit: Pt & Clinician Types of Service: Individual psychotherapy and Health Promotion  I connected with Novella Olive and/or [name]  self  via  Telephone or Video Enabled Telemedicine Application  (Video is Caregility application) and verified that I am speaking with the correct person using two identifiers. Discussed confidentiality:  2nd visit  I discussed the limitations of telemedicine and the availability of in person appointments.  Discussed there is a possibility of technology failure and discussed alternative modes of communication if that failure occurs.  I discussed that engaging in this telemedicine visit, they consent to the provision of behavioral healthcare and the services will be billed under their insurance.  Patient and/or legal guardian expressed understanding and consented to Telemedicine visit:  2nd visit  Presenting Concerns: Patient and/or family reports the following symptoms/concerns: reduced anx/dep due to improved adjustment to her recent move to [place] Duration of problem: months; Severity of problem: moderate  Patient and/or Family's Strengths/Protective Factors: Social and Emotional competence, Concrete supports in place (healthy food, safe environments, etc.), Sense of purpose, and Physical Health (exercise, healthy diet, medication compliance, etc.)  Goals Addressed: Patient will:  Reduce symptoms of: anxiety, depression, and sleep issues    Increase knowledge and/or ability of: coping skills and healthy habits   Demonstrate ability to: Increase healthy adjustment to current life  circumstances  Progress towards Goals: Ongoing  Interventions: Interventions utilized:  Behavioral Activation, Supportive Counseling, and suggestions for assisting 31yo Son when he is released from [facility] in Nov 2023 Standardized Assessments completed:  screeners prn  Patient and/or Family Response: Pt receptive to call today & agrees to session in early Feb once she has accomplished certain things for herself here in [place].  Assessment: Patient currently experiencing dec in anx/dep due to inc'd adjustment & coping with the move to [place] from [place] in Oct.   Pt has 2 Sons in prison in [place]; her 49yo Son has been in prison for 10 yrs & is released next Nov in 2023. Her younger 65yo Son is in jail for a year. She hopes they both have good adjustment back into society & realizes they may also need psychotherapy support.  Patient may benefit from sessions prn as she navigates the [place], family, & other issues of concern.  Plan: Follow up with behavioral health clinician on : one month in early Feb for 30 min telehealth session Behavioral recommendations: Use your Notebook to record the things you express today that you are grateful for in life. Referral(s): Integrated [employer] (In Clinic) and Referrals for Sons in [place] who may need services of a Therapist.  I discussed the assessment and treatment plan with the patient and/or parent/guardian. They were provided an opportunity to ask questions and all were answered. They agreed with the plan and demonstrated an understanding of the instructions.   They were advised to call back or seek an in-person evaluation if the symptoms worsen or if the condition fails to improve as anticipated.  Deneise Lever, LMFT

## 2021-05-04 ENCOUNTER — Other Ambulatory Visit: Payer: Self-pay

## 2021-05-04 ENCOUNTER — Ambulatory Visit: Payer: Medicaid Other | Admitting: Occupational Therapy

## 2021-05-04 ENCOUNTER — Encounter: Payer: Self-pay | Admitting: Occupational Therapy

## 2021-05-04 DIAGNOSIS — R278 Other lack of coordination: Secondary | ICD-10-CM

## 2021-05-04 DIAGNOSIS — M25611 Stiffness of right shoulder, not elsewhere classified: Secondary | ICD-10-CM

## 2021-05-04 DIAGNOSIS — G8929 Other chronic pain: Secondary | ICD-10-CM

## 2021-05-04 DIAGNOSIS — I69319 Unspecified symptoms and signs involving cognitive functions following cerebral infarction: Secondary | ICD-10-CM

## 2021-05-04 DIAGNOSIS — I69353 Hemiplegia and hemiparesis following cerebral infarction affecting right non-dominant side: Secondary | ICD-10-CM

## 2021-05-04 DIAGNOSIS — M6281 Muscle weakness (generalized): Secondary | ICD-10-CM | POA: Diagnosis not present

## 2021-05-04 NOTE — Patient Instructions (Signed)
All Fours Shoulder Flexion / Extension    Place hands and knees shoulder-width apart, rock back and sit on legs, then rock forward over hands and forearms. Repeat 10 times 1 sessions per day.  Angry Cat, All Fours    Kneel on hands and knees. Tuck chin and tighten stomach. Exhale and round back upward. Inhale and arch back downward. Hold each position 5seconds. Repeat 20 times per session. Do 1 sessions per day.

## 2021-05-04 NOTE — Therapy (Signed)
[facility] [address] [place], [place], [address] Phone: [phone]   Fax:  [phone]  Occupational Therapy Treatment  Patient Details  Name: Brookelynn Hamor MRN: [id number] Date of Birth: 1976/04/22 Referring Provider (OT): Dr. Oswaldo Done   Encounter Date: 05/04/2021   OT End of Session - 05/04/21 0947     Visit Number 8    Number of Visits 25    Date for OT Re-Evaluation 06/21/21    Authorization Type [employer] Medicaid    OT Start Time [id number]    OT Stop Time 1015    OT Time Calculation (min) 38 min    Activity Tolerance Patient tolerated treatment well    Behavior During Therapy [facility] for tasks assessed/performed             Past Medical History:  Diagnosis Date   Stroke [facility])     Past Surgical History:  Procedure Laterality Date   BUBBLE STUDY  01/29/2020   Procedure: BUBBLE STUDY;  Surgeon: Little Ishikawa, MD;  Location: [facility] ENDOSCOPY;  Service: Cardiovascular;;   TEE WITHOUT CARDIOVERSION N/A 01/29/2020   Procedure: TRANSESOPHAGEAL ECHOCARDIOGRAM (TEE);  Surgeon: Little Ishikawa, MD;  Location: [facility] ENDOSCOPY;  Service: Cardiovascular;  Laterality: N/A;    There were no vitals filed for this visit.   Subjective Assessment - 05/04/21 0947     Subjective  shoulder little sore Tues night--used hot/pack and tylenol.  "it felt like it was worked"--no pain after that    Pertinent History CVA      PMH:  hx of prior CVA (approx 4 yrs ago affecting L side, 01/2020 affecting R side, and 09/11/20 affecting R side); obesity, hyperlipidemia, sleep apnea    Patient Stated Goals get back to using my Rt arm, improve ability to do crafts    Currently in Pain? No/denies    Pain Onset In the past 7 days                 Supine, scapular retraction and depression AROM with BUEs with min cueing.  Self PROM shoulder flex stretch.  Closed-chain shoulder flex and chest press and horizontal abduction  with  foam noodle with min cueing/facilitation for scapula movement.  Prone, wt. Bearing on elbows with chest/head lift for scapular depression, shoulder ext, and scapular retraction with min cueing.    Standing, shoulder flex closed-chain with UE ranger with min cueing/facilitation for scapular depression.    Discussed new visits for new year and decided to reduce frequency to 1x wk for longer duration due to Medicaid visit limits (and pt seeing PT and will be evaluated for ST in the new yr).  Pt agreed with plan      OT Education - 05/04/21 1004     Education Details Additional HEP in Quadruped for scapular stability.--see pt instructions    Person(s) Educated Patient    Methods Explanation;Demonstration;Verbal cues;Handout    Comprehension Verbalized understanding;Returned demonstration;Verbal cues required              OT Short Term Goals - 04/27/21 1022       OT SHORT TERM GOAL #1   Title Pt will be independent with initial HEP.    Baseline no HEP    Time 4    Period Weeks    Status Achieved      OT SHORT TERM GOAL #2   Title Pt will demo at least 85* R shoulder flex for functional reach.    Baseline 60-70*  Time 4    Period Weeks    Status Achieved   04/27/21:  85* with 1-2/10 pain     OT SHORT TERM GOAL #3   Title Pt will improve R hand coordination for ADLs/IADLs as shown by improving score on box and blocks test by at least 10 blocks.    Baseline 27 blocks    Time 4    Period Weeks    Status On-going   04/27/21:  33 blocks     OT SHORT TERM GOAL #4   Title Pt will improve R hand grip strength to at least 15 lbs to assist in opening bottles/containers.    Baseline 6.1 lbs    Time 4    Period Weeks    Status On-going   04/27/21:  9.9lbs              OT Long Term Goals - 03/23/21 1204       OT LONG TERM GOAL #1   Title Pt will verbalize understanding of AE/strategies for incr safety/independence with ADLs/IADLs  (including cognitive and visual  strategies prn, and for opening jars/medicine bottles)    Baseline dependent, needs education    Time 12    Period Weeks    Status New      OT LONG TERM GOAL #2   Title Pt will demo at least 90* R shoulder flex for functional reach.    Baseline 60-70*    Time 12    Period Weeks    Status New      OT LONG TERM GOAL #3   Title Pt will improve R hand coordination for ADLs/IADLs and leisure tasks as shown by completing 9-hole peg test in 30 sec or less.    Baseline 35.94 sec    Time 12    Period Weeks    Status New      OT LONG TERM GOAL #4   Title Pt will improve R hand grip strength to at least 20 lbs to assist in opening bottles/containers.    Baseline 6.1 lbs    Time 12    Period Weeks    Status New      OT LONG TERM GOAL #5   Title Pt will tolerate shoulder P/ROM to 110* w/ pain less than or equal to 3/10    Baseline only to 85/90*    Time 12    Period Weeks    Status New                   Plan - 05/04/21 8756     Clinical Impression Statement Pt progressing with improved shoulder pain, incr ROM, and improved coordination.    OT Occupational Profile and History Detailed Assessment- Review of Records and additional review of physical, cognitive, psychosocial history related to current functional performance    Occupational performance deficits (Please refer to evaluation for details): ADL's;IADL's;Leisure    Body Structure / Function / Physical Skills ADL;Decreased knowledge of use of DME;Strength;Dexterity;Balance;UE functional use;Proprioception;ROM;IADL;Vision;Sensation;Mobility;Coordination;FMC    Cognitive Skills Attention;Memory;Problem Solve;Sequencing;Safety Awareness    Rehab Potential Good    Clinical Decision Making Several treatment options, min-mod task modification necessary    Comorbidities Affecting Occupational Performance: May have comorbidities impacting occupational performance    Modification or Assistance to Complete Evaluation   Min-Moderate modification of tasks or assist with assess necessary to complete eval    OT Frequency 2x / week    OT Duration 12 weeks   PLUS EVAL (anticipate  only 8 weeks)   OT Treatment/Interventions Self-care/ADL training;Moist Heat;Fluidtherapy;Splinting;Balance training;Therapeutic activities;Therapeutic exercise;Ultrasound;Cognitive remediation/compensation;Visual/perceptual remediation/compensation;Passive range of motion;Functional Mobility Training;Neuromuscular education;Cryotherapy;Energy conservation;Manual Therapy;Patient/family education;Paraffin;DME and/or AE instruction;Coping strategies training    Plan schedule/reduce frequency to 1x wk for 6wks; continue coordination and hand strengthening Rt hand and neuro-red ed R shoulder    Recommended Other Services Speech therapy - to schedule beginning in January when visits start over    Consulted and Agree with Plan of Care Patient             Patient will benefit from skilled therapeutic intervention in order to improve the following deficits and impairments:   Body Structure / Function / Physical Skills: ADL, Decreased knowledge of use of DME, Strength, Dexterity, Balance, UE functional use, Proprioception, ROM, IADL, Vision, Sensation, Mobility, Coordination, [facility] Cognitive Skills: Attention, Memory, Problem Solve, Sequencing, Safety Awareness     Visit Diagnosis: Hemiplegia and hemiparesis following cerebral infarction affecting right non-dominant side (HCC)  Chronic right shoulder pain  Other lack of coordination  Stiffness of right shoulder, not elsewhere classified  Unspecified symptoms and signs involving cognitive functions following cerebral infarction    Problem List Patient Active Problem List   Diagnosis Date Noted   Healthcare maintenance 03/07/2021   Acute DVT (deep venous thrombosis) (HCC) 10/06/2020   Elevated liver function tests 02/05/2020   Pre-diabetes 02/03/2020   Subclinical hypothyroidism  02/03/2020   Tobacco use disorder 01/27/2020   Hyperlipemia 01/27/2020   Obesity 01/27/2020   History of CVA with residual deficit 01/26/2020    [facility], OT 05/04/2021, 10:50 AM  [facility] [address] [place], [place], [address] Phone: [phone]   Fax:  [phone]  Name: Mettie Roylance MRN: [id number] Date of Birth: [birth year]  Willa Frater, OTR/L [facility] [address] [place], [place]  [address] [phone] phone [phone] 05/04/21 10:50 AM

## 2021-05-09 ENCOUNTER — Ambulatory Visit: Payer: Medicaid Other | Admitting: Occupational Therapy

## 2021-05-10 ENCOUNTER — Other Ambulatory Visit: Payer: Self-pay

## 2021-05-10 ENCOUNTER — Ambulatory Visit: Payer: Medicaid Other | Admitting: Occupational Therapy

## 2021-05-10 ENCOUNTER — Encounter: Payer: Self-pay | Admitting: Physical Therapy

## 2021-05-10 ENCOUNTER — Ambulatory Visit: Payer: Medicaid Other | Attending: Neurology | Admitting: Physical Therapy

## 2021-05-10 DIAGNOSIS — R4701 Aphasia: Secondary | ICD-10-CM | POA: Insufficient documentation

## 2021-05-10 DIAGNOSIS — M25511 Pain in right shoulder: Secondary | ICD-10-CM | POA: Insufficient documentation

## 2021-05-10 DIAGNOSIS — G8929 Other chronic pain: Secondary | ICD-10-CM

## 2021-05-10 DIAGNOSIS — I69353 Hemiplegia and hemiparesis following cerebral infarction affecting right non-dominant side: Secondary | ICD-10-CM | POA: Insufficient documentation

## 2021-05-10 DIAGNOSIS — R2681 Unsteadiness on feet: Secondary | ICD-10-CM | POA: Insufficient documentation

## 2021-05-10 DIAGNOSIS — R471 Dysarthria and anarthria: Secondary | ICD-10-CM | POA: Diagnosis present

## 2021-05-10 DIAGNOSIS — R278 Other lack of coordination: Secondary | ICD-10-CM | POA: Diagnosis present

## 2021-05-10 DIAGNOSIS — M6281 Muscle weakness (generalized): Secondary | ICD-10-CM | POA: Insufficient documentation

## 2021-05-10 DIAGNOSIS — M25611 Stiffness of right shoulder, not elsewhere classified: Secondary | ICD-10-CM | POA: Diagnosis present

## 2021-05-10 NOTE — Therapy (Signed)
[facility] [address] [place], [place], [address] Phone: [phone]   Fax:  [phone]  Occupational Therapy Treatment  Patient Details  Name: Maria OliveDanielle [place] MRN: [id number] Date of Birth: 11-Apr-1976 Referring Provider (OT): Dr. Oswaldo DoneVincent   Encounter Date: 05/10/2021   OT End of Session - 05/10/21 1304     Visit Number 9    Number of Visits 25    Date for OT Re-Evaluation 06/21/21    Authorization Type [employer] Medicaid    OT Start Time 0930    OT Stop Time 1015    OT Time Calculation (min) 45 min    Activity Tolerance Patient tolerated treatment well    Behavior During Therapy [facility] for tasks assessed/performed             Past Medical History:  Diagnosis Date   Stroke [facility](H[facility])     Past Surgical History:  Procedure Laterality Date   BUBBLE STUDY  01/29/2020   Procedure: BUBBLE STUDY;  Surgeon: [facility], Maria L, MD;  Location: [facility];  Service: Cardiovascular;;   TEE WITHOUT CARDIOVERSION N/A 01/29/2020   Procedure: TRANSESOPHAGEAL ECHOCARDIOGRAM (TEE);  Surgeon: [facility], Maria L, MD;  Location: [facility];  Service: Cardiovascular;  Laterality: N/A;    There were no vitals filed for this visit.   Subjective Assessment - 05/10/21 0933     Subjective  The rain may be making my shoulder hurt some    Pertinent History CVA      PMH:  hx of prior CVA (approx 4 yrs ago affecting Porter side, 01/2020 affecting R side, and 09/11/20 affecting R side); obesity, hyperlipidemia, sleep apnea    Patient Stated Goals get back to using my Rt arm, improve ability to do crafts    Currently in Pain? Yes    Pain Score 2     Pain Location Shoulder    Pain Orientation Right    Pain Descriptors / Indicators Aching    Pain Type Acute pain    Pain Onset Today    Pain Frequency Intermittent    Aggravating Factors  malpositioning, rain    Pain Relieving Factors heat pack, tylenol, proper positioning             Supine: worked on scapula retraction and depression, followed by BUE sh flexion w/ focus on normal scapula movement. BUE horizontal abd/add w/ rolling to Rt/Lt  Prone: scapula retraction x 10 w/ min tactile cues, followed by prone on elbows w/ chest lift for scapula depression and protraction.  Quadraped: cat/cow stretch and A/P wt shifts for scapula stability Seated: worked in closed chain sh flexion RUE w/ cues to lead w/ hand followed by open chain reaching in low to mid level ranges w/ min distal support                        OT Short Term Goals - 04/27/21 1022       OT SHORT TERM GOAL #1   Title Pt will be independent with initial HEP.    Baseline no HEP    Time 4    Period Weeks    Status Achieved      OT SHORT TERM GOAL #2   Title Pt will demo at least 85* R shoulder flex for functional reach.    Baseline 60-70*    Time 4    Period Weeks    Status Achieved   04/27/21:  85* with 1-2/10 pain  OT SHORT TERM GOAL #3   Title Pt will improve R hand coordination for ADLs/IADLs as shown by improving score on box and blocks test by at least 10 blocks.    Baseline 27 blocks    Time 4    Period Weeks    Status On-going   04/27/21:  33 blocks     OT SHORT TERM GOAL #4   Title Pt will improve R hand grip strength to at least 15 lbs to assist in opening bottles/containers.    Baseline 6.1 lbs    Time 4    Period Weeks    Status On-going   04/27/21:  9.9lbs              OT Long Term Goals - 03/23/21 1204       OT LONG TERM GOAL #1   Title Pt will verbalize understanding of AE/strategies for incr safety/independence with ADLs/IADLs  (including cognitive and visual strategies prn, and for opening jars/medicine bottles)    Baseline dependent, needs education    Time 12    Period Weeks    Status New      OT LONG TERM GOAL #2   Title Pt will demo at least 90* R shoulder flex for functional reach.    Baseline 60-70*    Time 12    Period Weeks     Status New      OT LONG TERM GOAL #3   Title Pt will improve R hand coordination for ADLs/IADLs and leisure tasks as shown by completing 9-hole peg test in 30 sec or less.    Baseline 35.94 sec    Time 12    Period Weeks    Status New      OT LONG TERM GOAL #4   Title Pt will improve R hand grip strength to at least 20 lbs to assist in opening bottles/containers.    Baseline 6.1 lbs    Time 12    Period Weeks    Status New      OT LONG TERM GOAL #5   Title Pt will tolerate shoulder P/ROM to 110* w/ pain less than or equal to 3/10    Baseline only to 85/90*    Time 12    Period Weeks    Status New                   Plan - 05/10/21 1304     Clinical Impression Statement Pt progressing with improved shoulder pain, incr ROM. Pt still w/ limitations at shoulder against gravity and difficult to break out of improper patterns which cause pain    OT Occupational Profile and History Detailed Assessment- Review of Records and additional review of physical, cognitive, psychosocial history related to current functional performance    Occupational performance deficits (Please refer to evaluation for details): ADL's;IADL's;Leisure    Body Structure / Function / Physical Skills ADL;Decreased knowledge of use of DME;Strength;Dexterity;Balance;UE functional use;Proprioception;ROM;IADL;Maria;Sensation;Mobility;Coordination;FMC    Cognitive Skills Attention;Memory;Problem Solve;Sequencing;Safety Awareness    Rehab Potential Good    Clinical Decision Making Several treatment options, min-mod task modification necessary    Comorbidities Affecting Occupational Performance: May have comorbidities impacting occupational performance    Modification or Assistance to Complete Evaluation  Min-Moderate modification of tasks or assist with assess necessary to complete eval    OT Frequency 2x / week    OT Duration 12 weeks   PLUS EVAL (anticipate only 8 weeks)   OT Treatment/Interventions  Self-care/ADL  training;Moist Heat;Fluidtherapy;Splinting;Balance training;Therapeutic activities;Therapeutic exercise;Ultrasound;Cognitive remediation/compensation;Visual/perceptual remediation/compensation;Passive range of motion;Functional Mobility Training;Neuromuscular education;Cryotherapy;Energy conservation;Manual Therapy;Patient/family education;Paraffin;DME and/or AE instruction;Coping strategies training    Plan continue coordination and hand strengthening Rt hand and neuro-red ed R shoulder    Recommended Other Services Speech therapy - to schedule beginning in January when visits start over    Consulted and Agree with Plan of Care Patient             Patient will benefit from skilled therapeutic intervention in order to improve the following deficits and impairments:   Body Structure / Function / Physical Skills: ADL, Decreased knowledge of use of DME, Strength, Dexterity, Balance, UE functional use, Proprioception, ROM, IADL, Maria, Sensation, Mobility, Coordination, [facility] Cognitive Skills: Attention, Memory, Problem Solve, Sequencing, Safety Awareness     Visit Diagnosis: Hemiplegia and hemiparesis following cerebral infarction affecting right non-dominant side (HCC)  Chronic right shoulder pain  Stiffness of right shoulder, not elsewhere classified    Problem List Patient Active Problem List   Diagnosis Date Noted   Healthcare maintenance 03/07/2021   Acute DVT (deep venous thrombosis) (HCC) 10/06/2020   Elevated liver function tests 02/05/2020   Pre-diabetes 02/03/2020   Subclinical hypothyroidism 02/03/2020   Tobacco use disorder 01/27/2020   Hyperlipemia 01/27/2020   Obesity 01/27/2020   History of CVA with residual deficit 01/26/2020    Kelli Churn, OTR/[place] 05/10/2021, 1:06 PM  [facility] [address] [place], [place], [address] Phone: [phone]   Fax:  [phone]  Name: [facility] MRN: [id number] Date of Birth: Oct 04, 1975

## 2021-05-10 NOTE — Therapy (Addendum)
[facility] [address] [place], [place], [address] Phone: [phone]   Fax:  [phone]  Physical Therapy Treatment/Re-Cert  Patient Details  Name: Maria Porter MRN: [id number] Date of Birth: 1976/03/25 Referring Provider (PT): Axel Filler, MD   Encounter Date: 05/10/2021     05/10/21 1203  PT Visits / Re-Eval  Visit Number 7  Number of Visits 15  Date for PT Re-Evaluation 07/05/21  Authorization  Authorization Type [employer] - 27 visit limit for 2023  Authorization Time Period Auth not required  Authorization - Visit Number 1  Authorization - Number of Visits 8  Progress Note Due on Visit 10  PT Time Calculation  PT Start Time 1016  PT Stop Time 1100  PT Time Calculation (min) 44 min  PT - End of Session  Behavior During Therapy [facility] for tasks assessed/performed    Past Medical History:  Diagnosis Date   Stroke [facility])     Past Surgical History:  Procedure Laterality Date   BUBBLE STUDY  01/29/2020   Procedure: BUBBLE STUDY;  Surgeon: [name], MD;  Location: [place];  Service: Cardiovascular;;   TEE WITHOUT CARDIOVERSION N/A 01/29/2020   Procedure: TRANSESOPHAGEAL ECHOCARDIOGRAM (TEE);  Surgeon: [name], MD;  Location: [facility] ENDOSCOPY;  Service: Cardiovascular;  Laterality: N/A;    There were no vitals filed for this visit.   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    Pertinent History prior CVA, hypothyroidism, elevated BMI    How long can you sit comfortably? no issues    How long can you stand comfortably? No issues    How long can you walk comfortably? Sometimes legs get achy, needs to take her time and she can get around grocery store    Currently in Pain? No/denies    Pain Onset Today                 05/10/21 0001  Assessment  Medical Diagnosis  I63.9 (ICD-10-CM) - Cryptogenic stroke ([place]) Acute CVA in the left MCA territory with left MCA occlusion  I69.320 (ICD-10-CM) - Aphasia as late effect of stroke  Referring Provider (PT) Axel Filler, MD  Prior Function  Level of Independence Independent  Ambulation/Gait  Ambulation/Gait Yes  Ambulation/Gait Assistance 6: Modified independent (Device/Increase time) (slow pace)  Ambulation/Gait Assistance Details 1,000 foot walk to simulate community and work environment including visual scanning, and community type obstacles.  Ambulation Distance (Feet) 1000 Feet  Assistive device None  Gait Pattern Step-through pattern;Decreased weight shift to right  Ambulation Surface Level;Indoor;Unlevel  Stairs Yes  Stairs Assistance 6: Modified independent (Device/Increase time) (cues for reciprocal gait. pt able to perform but slow and laboured with step up/downs)  Stair Management Technique One rail Left;Forwards;Alternating pattern  Number of Stairs 4  Height of Stairs 6  Ramp 6: Modified independent (Device)  Curb 6: Modified independent (Device/increase time) (slow labored movement in stepping up/down.)  Dynamic Gait Index  Level Surface 3  Change in Gait Speed 3  Gait with Horizontal Head Turns 2  Gait with Vertical Head Turns 2  Gait and Pivot Turn 3  Step Over Obstacle 2  Step Around Obstacles 3  Steps 2  Total Score 20  DGI comment: Scores of 19 or less are predictive of falls in older community living adults      [facility] PT Assessment -  05/10/21 0001       Dynamic Gait Index   Level Surface Normal    Change in Gait Speed Normal    Gait with Horizontal Head Turns Mild Impairment    Gait with Vertical Head Turns Mild Impairment    Gait and Pivot Turn Normal    Step Over Obstacle Mild Impairment    Step Around Obstacles Normal    Steps Mild Impairment    Total Score 20    DGI comment: Scores of 19 or less are predictive of falls in older community living adults                            [facility] Adult PT Treatment/Exercise - 05/10/21 0001       Ambulation/Gait   Ambulation/Gait Yes    Ambulation/Gait Assistance 6: Modified independent (Device/Increase time)   slow pace   Ambulation/Gait Assistance Details 1,000 foot walk to simulate community and work environment including visual scanning, and community type obstacles.    Ambulation Distance (Feet) 1000 Feet    Assistive device None    Gait Pattern Step-through pattern;Decreased weight shift to right    Ambulation Surface Level;Indoor;Unlevel    Stairs Yes    Stairs Assistance 6: Modified independent (Device/Increase time)   cues for reciprocal gait. pt able to perform but slow and laboured with step up/downs   Stair Management Technique One rail Left;Forwards;Alternating pattern    Number of Stairs 4    Height of Stairs 6    Ramp 6: Modified independent (Device)    Curb 6: Modified independent (Device/increase time)   slow labored movement in stepping up/down.     Knee/Hip Exercises: Aerobic   Nustep Sci fit stepper, Level 3.5 to 2.5 all extremity then stopped using R UE due to discomfort. 8 min                     PT Education - 05/10/21 1048     Education Details Discussed goals checked, schedule, walking program and plan for going back to work.    Person(s) Educated Patient    Methods Explanation;Handout    Comprehension Verbalized understanding;Need further instruction              PT Short Term Goals - 04/24/21 0859       PT SHORT TERM GOAL #1   Title Pt will be independent with strengthening HEP    Baseline HFJF4F2 - pt performing exercises consistently at home.    Time 2    Period Weeks    Status Achieved    Target Date 04/13/21      PT SHORT TERM GOAL #2   Title Pt will have improved 5x STS to </=20 sec    Baseline 30.1 s with UE support, 22 seconds with BUE support on 04/24/21    Time 2    Period Weeks    Status Not Met    Target  Date 04/13/21      PT SHORT TERM GOAL #3   Title Patient to negotiate full flight of stairs with most appropriate support and pattern.    Baseline 4 steps with L rail and step to pattern, 12 steps with L rail and step to pattern on 04/24/21    Time 2    Period Weeks    Status Achieved    Target Date 04/13/21            Updated STGs  for re-cert:   PT Short Term Goals - 05/24/21 1119       PT SHORT TERM GOAL #1   Title Pt will undergo further assessment of FGA with LTG written. ALL STGS DUE 06/07/21    Baseline not yet assessed.    Time 4    Period Weeks    Status New    Target Date 06/07/21      PT SHORT TERM GOAL #2   Title Pt will have improved 5x STS to </=20 sec    Baseline 30.1 s with UE support, 22 seconds with BUE support on 04/24/21    Time 4    Period Weeks    Status New      PT SHORT TERM GOAL #3   Title Pt will decr TUG to 14 seconds with no AD in order to demo decr fall risk.    Baseline 16.21 seconds with no AD    Time 4    Period Weeks    Status New      PT SHORT TERM GOAL #4   Title Pt will undergo further assessment of gait speed with no AD with LTG written.    Baseline not yet assessed.    Time 4    Period Weeks    Status New               PT Long Term Goals - 05/10/21 1206       PT LONG TERM GOAL #1   Title Pt will be independent with self progressing her strengthening HEP    Baseline HFJF4F2; pt reports consistency with her HEP    Time 4    Period Weeks    Status Achieved    Target Date 04/27/21      PT LONG TERM GOAL #2   Title Pt will have improved 5x STS </=13 sec to demo low fall risk    Baseline 30.1s; 22 seconds with BUE support on 04/24/21    Time 4    Period Weeks    Status Not Met    Target Date 04/27/21      PT LONG TERM GOAL #3   Title Pt will have improved TUG score of </=15 sec to demo low fall risk with no A/D    Baseline TUG score 27s w/o AD; 16.21 seconds no AD    Time 4    Period Weeks    Status Not Met     Target Date 04/27/21      PT LONG TERM GOAL #4   Title Pt will have improved Berg Balance Score of at least 45/56 to demo low fall risk    Baseline Initial BERG score 29; 47/56 on 04/27/21    Time 4    Period Weeks    Status Achieved    Target Date 04/27/21      PT LONG TERM GOAL #5   Title Pt will be able to amb without a/d for 1075ft' indoors and outdoors to demo improved community mobility    Baseline 1000 ft, no AD, in clinic negotiating community type obstacles at [place], no LOB noted but slow speed with gait and with obstacle negotiation.    Time 4    Period Weeks    Status Achieved    Target Date 04/27/21      PT LONG TERM GOAL #6   Title Increase DGI score to 18/24    Baseline DGI score 20/24    Time 4  Period Weeks    Status Achieved    Target Date 04/27/21             Updated LTGs for re-cert:    PT Long Term Goals - 05/24/21 1122       PT LONG TERM GOAL #1   Title Pt will be independent with self progressing her strengthening HEP. ALL LTGS DUE 07/05/21    Baseline HFJF4F2; pt reports consistency with her HEP, will benefit from additions/review    Time 8    Period Weeks    Status On-going    Target Date 07/05/21      PT LONG TERM GOAL #2   Title Pt will have improved 5x STS </=15 sec to demo low fall risk    Baseline 30.1s; 22 seconds with BUE support on 04/24/21    Time 8    Period Weeks    Status On-going      PT LONG TERM GOAL #3   Title Pt will have improved TUG score of </=13.5 sec to demo low fall risk with no A/D    Baseline TUG score 27s w/o AD; 16.21 seconds no AD    Time 8    Period Weeks    Status Revised      PT LONG TERM GOAL #4   Title FGA goal to be written as appropriate.    Baseline not yet assessed.    Time 8    Period Weeks    Status New      PT LONG TERM GOAL #5   Title Gait speed goal with no AD to be written.    Baseline not yet assessed.    Time 8    Period Weeks    Status New                   05/10/21 1050  Plan  Clinical Impression Statement Re-cert per primary PT: Pt met the last 2 LTGs in regards to community mobility and improved DGI score to a 20/24, decr pt's risks of falls. When goals were previously checked, pt with improvement of 5x sit<> stand and TUG with no AD. However, pt still at an incr risk for falls.   Pt demonstrates progress with increase gait distance without AD but continues to need to work on gait speed and obstacle negotiation (demontrates slow laboured pattern with stepping over objects and steps). Pt will continue to benefit from skilled PT in order to work on strengthening, balance, functional mobility, gait in order to decr fall risk and improve independence. STGs/LTGs updated as appropriate.  Personal Factors and Comorbidities Age;Fitness;Comorbidity 1;Time since onset of injury/illness/exacerbation  Comorbidities prior CVA, hypothyroidism, elevated BMI  Examination-Activity Limitations Locomotion Level;Transfers;Stand;Stairs;Squat;Carry;Lift;Bend  Examination-Participation Restrictions Cleaning;Occupation;Community Activity;Shop;Yard Work  Pt will benefit from skilled therapeutic intervention in order to improve on the following deficits Difficulty walking;Decreased activity tolerance;Decreased balance;Decreased mobility;Decreased strength;Decreased cognition;Abnormal gait;Decreased safety awareness;Dizziness  Stability/Clinical Decision Making Evolving/Moderate complexity  Rehab Potential Good  PT Frequency 1x / week  PT Duration 8 weeks  PT Treatment/Interventions ADLs/Self Care Home Management;Aquatic Therapy;Moist Heat;DME Instruction;Gait training;Stair training;Functional mobility training;Therapeutic activities;Therapeutic exercise;Balance training;Neuromuscular re-education;Manual techniques;Patient/family education;Passive range of motion;Energy conservation  PT Next Visit Plan picking weight from floor ( for work readiness) step ups/downs .  balance  with focus on SLS, RLE weight bearing, tandem, eyes closed, narrow BOS. RLE strength. NuStep/Scifit  PT Home Exercise Plan [id number]  Consulted and Agree with Plan of Care Patient    Patient will benefit from  skilled therapeutic intervention in order to improve the following deficits and impairments:  Difficulty walking, Decreased activity tolerance, Decreased balance, Decreased mobility, Decreased strength, Decreased cognition, Abnormal gait, Decreased safety awareness, Dizziness  Visit Diagnosis: Hemiplegia and hemiparesis following cerebral infarction affecting right non-dominant side (HCC)  Other lack of coordination  Unsteadiness on feet  Muscle weakness (generalized)     Problem List Patient Active Problem List   Diagnosis Date Noted   Healthcare maintenance 03/07/2021   Acute DVT (deep venous thrombosis) ([place]) 10/06/2020   Elevated liver function tests 02/05/2020   Pre-diabetes 02/03/2020   Subclinical hypothyroidism 02/03/2020   Tobacco use disorder 01/27/2020   Hyperlipemia 01/27/2020   Obesity 01/27/2020   History of CVA with residual deficit 01/26/2020    Bjorn Loser, PTA  05/10/21, 12:14 PM   [facility] [address] [place], [place], [address] Phone: [phone]   Fax:  [phone]  Name: Maria Porter MRN: [id number] Date of Birth: [birth year]   Re-Cert done by: Janann August, PT, DPT 05/24/21 11:16 AM

## 2021-05-10 NOTE — Patient Instructions (Signed)
Walking Program  (Parking Lot)  2x/week x2    Work on Building surveyor, changing speed

## 2021-05-11 ENCOUNTER — Encounter: Payer: Medicaid Other | Admitting: Occupational Therapy

## 2021-05-11 DIAGNOSIS — H5213 Myopia, bilateral: Secondary | ICD-10-CM | POA: Diagnosis not present

## 2021-05-15 ENCOUNTER — Encounter: Payer: Self-pay | Admitting: Speech Pathology

## 2021-05-15 ENCOUNTER — Other Ambulatory Visit: Payer: Self-pay

## 2021-05-15 ENCOUNTER — Encounter: Payer: Self-pay | Admitting: Physical Therapy

## 2021-05-15 ENCOUNTER — Ambulatory Visit: Payer: Medicaid Other | Admitting: Speech Pathology

## 2021-05-15 ENCOUNTER — Ambulatory Visit: Payer: Medicaid Other | Admitting: Physical Therapy

## 2021-05-15 ENCOUNTER — Ambulatory Visit: Payer: Medicaid Other | Admitting: Occupational Therapy

## 2021-05-15 DIAGNOSIS — R278 Other lack of coordination: Secondary | ICD-10-CM

## 2021-05-15 DIAGNOSIS — R4701 Aphasia: Secondary | ICD-10-CM

## 2021-05-15 DIAGNOSIS — M6281 Muscle weakness (generalized): Secondary | ICD-10-CM

## 2021-05-15 DIAGNOSIS — I69353 Hemiplegia and hemiparesis following cerebral infarction affecting right non-dominant side: Secondary | ICD-10-CM

## 2021-05-15 DIAGNOSIS — R471 Dysarthria and anarthria: Secondary | ICD-10-CM

## 2021-05-15 DIAGNOSIS — G8929 Other chronic pain: Secondary | ICD-10-CM

## 2021-05-15 DIAGNOSIS — R2681 Unsteadiness on feet: Secondary | ICD-10-CM

## 2021-05-15 NOTE — Therapy (Signed)
[facility] [address] [place], [place], [address] Phone: [phone]   Fax:  [phone]  Occupational Therapy Treatment  Patient Details  Name: Maria Porter MRN: [id number] Date of Birth: Feb 13, 1976 Referring Provider (OT): Dr. Oswaldo DoneVincent   Encounter Date: 05/15/2021   OT End of Session - 05/15/21 1002     Visit Number 10    Number of Visits 25    Date for OT Re-Evaluation 06/21/21    Authorization Type [employer] Medicaid    OT Start Time 0940   transportation got pt here late   OT Stop Time 1015    OT Time Calculation (min) 35 min    Activity Tolerance Patient tolerated treatment well    Behavior [facility] Therapy [facility] for tasks assessed/performed             Past Medical History:  Diagnosis Date   Stroke [facility](HCC)     Past Surgical History:  Procedure Laterality Date   BUBBLE STUDY  01/29/2020   Procedure: BUBBLE STUDY;  Surgeon: Little IshikawaSchumann, Christopher L, MD;  [facility];  Service: Cardiovascular;;   TEE WITHOUT CARDIOVERSION N/A 01/29/2020   Procedure: TRANSESOPHAGEAL ECHOCARDIOGRAM (TEE);  Surgeon: Little IshikawaSchumann, Christopher L, MD;  [facility];  Service: Cardiovascular;  Laterality: N/A;    There were no vitals filed for this visit.   Subjective Assessment - 05/15/21 0944     Subjective  No pain at shoulder at rest    Pertinent History CVA      PMH:  hx of prior CVA (approx 4 yrs ago affecting L side, 01/2020 affecting R side, and 09/11/20 affecting R side); obesity, hyperlipidemia, sleep apnea    Patient Stated Goals get back to using my Rt arm, improve ability to do crafts    Currently in Pain? No/denies    Pain Onset Today            Seated: AA/ROM and closed chain sh flexion BUE's and RUE only w/ cues to prevent compensations as able, in mid level ranges.   Verbally reviewed ex's in supine, prone, and quadraped to maintain shoulder stability and balance b/t anterior and posterior sh  musculature  Practiced placing small pegs in pegboard (3 rows) Rt hand for coordination w/ min difficulty  UBE x 5 min, level 1 for reciprocal movement pattern and encouraging normal movement RUE                        OT Short Term Goals - 04/27/21 1022       OT SHORT TERM GOAL #1   Title Pt will be independent with initial HEP.    Baseline no HEP    Time 4    Period Weeks    Status Achieved      OT SHORT TERM GOAL #2   Title Pt will demo at least 85* R shoulder flex for functional reach.    Baseline 60-70*    Time 4    Period Weeks    Status Achieved   04/27/21:  85* with 1-2/10 pain     OT SHORT TERM GOAL #3   Title Pt will improve R hand coordination for ADLs/IADLs as shown by improving score on box and blocks test by at least 10 blocks.    Baseline 27 blocks    Time 4    Period Weeks    Status On-going   04/27/21:  33 blocks     OT SHORT TERM  GOAL #4   Title Pt will improve R hand grip strength to at least 15 lbs to assist in opening bottles/containers.    Baseline 6.1 lbs    Time 4    Period Weeks    Status On-going   04/27/21:  9.9lbs              OT Long Term Goals - 03/23/21 1204       OT LONG TERM GOAL #1   Title Pt will verbalize understanding of AE/strategies for incr safety/independence with ADLs/IADLs  (including cognitive and visual strategies prn, and for opening jars/medicine bottles)    Baseline dependent, needs education    Time 12    Period Weeks    Status New      OT LONG TERM GOAL #2   Title Pt will demo at least 90* R shoulder flex for functional reach.    Baseline 60-70*    Time 12    Period Weeks    Status New      OT LONG TERM GOAL #3   Title Pt will improve R hand coordination for ADLs/IADLs and leisure tasks as shown by completing 9-hole peg test in 30 sec or less.    Baseline 35.94 sec    Time 12    Period Weeks    Status New      OT LONG TERM GOAL #4   Title Pt will improve R hand grip strength to  at least 20 lbs to assist in opening bottles/containers.    Baseline 6.1 lbs    Time 12    Period Weeks    Status New      OT LONG TERM GOAL #5   Title Pt will tolerate shoulder P/ROM to 110* w/ pain less than or equal to 3/10    Baseline only to 85/90*    Time 12    Period Weeks    Status New                   Plan - 05/15/21 1006     Clinical Impression Statement Pt progressing with improved shoulder pain, incr ROM. Pt still w/ limitations at shoulder against gravity and difficult to break out of improper patterns which cause pain but getting closer to midrange w/o assist    OT Occupational Profile and History Detailed Assessment- Review of Records and additional review of physical, cognitive, psychosocial history related to current functional performance    Occupational performance deficits (Please refer to evaluation for details): ADL's;IADL's;Leisure    Body Structure / Function / Physical Skills ADL;Decreased knowledge of use of DME;Strength;Dexterity;Balance;UE functional use;Proprioception;ROM;IADL;Vision;Sensation;Mobility;Coordination;FMC    Cognitive Skills Attention;Memory;Problem Solve;Sequencing;Safety Awareness    Rehab Potential Good    Clinical Decision Making Several treatment options, min-mod task modification necessary    Comorbidities Affecting Occupational Performance: May have comorbidities impacting occupational performance    Modification or Assistance to Complete Evaluation  Min-Moderate modification of tasks or assist with assess necessary to complete eval    OT Frequency 2x / week    OT Duration 12 weeks   PLUS EVAL (anticipate only 8 weeks)   OT Treatment/Interventions Self-care/ADL training;Moist Heat;Fluidtherapy;Splinting;Balance training;Therapeutic activities;Therapeutic exercise;Ultrasound;Cognitive remediation/compensation;Visual/perceptual remediation/compensation;Passive range of motion;Functional Mobility Training;Neuromuscular  education;Cryotherapy;Energy conservation;Manual Therapy;Patient/family education;Paraffin;DME and/or AE instruction;Coping strategies training    Plan reassess STG #3, continue neuro-red ed R shoulder, coordination, UBE    Recommended Other Services Speech therapy - to schedule beginning in January when visits start over    Consulted and  Agree with Plan of Care Patient             Patient will benefit from skilled therapeutic intervention in order to improve the following deficits and impairments:   Body Structure / Function / Physical Skills: ADL, Decreased knowledge of use of DME, Strength, Dexterity, Balance, UE functional use, Proprioception, ROM, IADL, Vision, Sensation, Mobility, Coordination, [facility] Cognitive Skills: Attention, Memory, Problem Solve, Sequencing, Safety Awareness     Visit Diagnosis: Hemiplegia and hemiparesis following cerebral infarction affecting right non-dominant side (HCC)  Chronic right shoulder pain  Other lack of coordination  Muscle weakness (generalized)    Problem List Patient Active Problem List   Diagnosis Date Noted   Healthcare maintenance 03/07/2021   Acute DVT (deep venous thrombosis) (HCC) 10/06/2020   Elevated liver function tests 02/05/2020   Pre-diabetes 02/03/2020   Subclinical hypothyroidism 02/03/2020   Tobacco use disorder 01/27/2020   Hyperlipemia 01/27/2020   Obesity 01/27/2020   History of CVA with residual deficit 01/26/2020    Kelli Churn, OTR/L 05/15/2021, 12:06 PM  [facility] [address] [place], [place], [address] Phone: [phone]   Fax:  [phone]  Name: Maria Porter MRN: [id number] Date of Birth: [birth year]

## 2021-05-15 NOTE — Therapy (Addendum)
[facility] [address] [place], [place], [address] Phone: [phone]   Fax:  [phone]  Physical Therapy Treatment  Patient Details  Name: Maria Porter MRN: [id number] Date of Birth: 1976/03/15 Referring Provider (PT): Axel Filler, MD   Encounter Date: 05/15/2021    05/15/21 1145  PT Visits / Re-Eval  Visit Number 8  Number of Visits 15  Date for PT Re-Evaluation 07/05/21  Authorization  Authorization Type [employer] - 27 visit limit for 2023  Authorization Time Period Auth not required  Authorization - Visit Number 2  Authorization - Number of Visits 8  Progress Note Due on Visit 10  PT Time Calculation  PT Start Time 1105  PT Stop Time 1143  PT Time Calculation (min) 38 min  PT - End of Session  Behavior During Therapy [facility] for tasks assessed/performed     Past Medical History:  Diagnosis Date   Stroke [facility])     Past Surgical History:  Procedure Laterality Date   BUBBLE STUDY  01/29/2020   Procedure: BUBBLE STUDY;  Surgeon: [name], MD;  Location: [facility];  Service: Cardiovascular;;   TEE WITHOUT CARDIOVERSION N/A 01/29/2020   Procedure: TRANSESOPHAGEAL ECHOCARDIOGRAM (TEE);  Surgeon: [name], MD;  Location: [facility] ENDOSCOPY;  Service: Cardiovascular;  Laterality: N/A;    There were no vitals filed for this visit.   Subjective Assessment - 05/15/21 1106     Subjective Pt reports not really walking much over the weekend. Pt reports walking last week at [facility] for exercise using a cart.    Pertinent History prior CVA, hypothyroidism, elevated BMI    How long can you sit comfortably? no issues    How long can you stand comfortably? No issues    How long can you walk comfortably? 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 No/denies                                    Balance Exercises - 05/15/21 0001       Balance Exercises: Standing   Step Ups Forward;6 inch;Intermittent UE support   progressing 1. with alt. step ups holding 4lb ball. 2. step downs (4") leading with each foot 5x2  Working on muscle strengthening, control, and balance, cues for technique   Other Standing Exercises 1/2 kneel <> to stand work simulation/ functional strengthening with bilateral UE strengthening.    Other Standing Exercises Comments squats picking up<>placing red physio ball on table; cues for body mechanics.                PT Education - 05/15/21 1249     Education Details Encouraged pt to walk for exercise more consistently and practise gait without a SPC when walking with someone else.    Person(s) Educated Patient    Methods Explanation    Comprehension Verbalized understanding              PT Short Term Goals - 05/24/21 1119       PT SHORT TERM GOAL #1   Title Pt will undergo further assessment of FGA with LTG written. ALL  STGS DUE 06/07/21    Baseline not yet assessed.    Time 4    Period Weeks    Status New    Target Date 06/07/21      PT SHORT TERM GOAL #2   Title Pt will have improved 5x STS to </=20 sec    Baseline 30.1 s with UE support, 22 seconds with BUE support on 04/24/21    Time 4    Period Weeks    Status New      PT SHORT TERM GOAL #3   Title Pt will decr TUG to 14 seconds with no AD in order to demo decr fall risk.    Baseline 16.21 seconds with no AD    Time 4    Period Weeks    Status New      PT SHORT TERM GOAL #4   Title Pt will undergo further assessment of gait speed with no AD with LTG written.    Baseline not yet assessed.    Time 4    Period Weeks    Status New             PT Long Term Goals - 05/24/21 1122       PT LONG TERM GOAL #1   Title Pt will be  independent with self progressing her strengthening HEP. ALL LTGS DUE 07/05/21    Baseline [id number]; pt reports consistency with her HEP, will benefit from additions/review    Time 8    Period Weeks    Status On-going    Target Date 07/05/21      PT LONG TERM GOAL #2   Title Pt will have improved 5x STS </=15 sec to demo low fall risk    Baseline 30.1s; 22 seconds with BUE support on 04/24/21    Time 8    Period Weeks    Status On-going      PT LONG TERM GOAL #3   Title Pt will have improved TUG score of </=13.5 sec to demo low fall risk with no A/D    Baseline TUG score 27s w/o AD; 16.21 seconds no AD    Time 8    Period Weeks    Status Revised      PT LONG TERM GOAL #4   Title FGA goal to be written as appropriate.    Baseline not yet assessed.    Time 8    Period Weeks    Status New      PT LONG TERM GOAL #5   Title Gait speed goal with no AD to be written.    Baseline not yet assessed.    Time 8    Period Weeks    Status New              05/15/21 1246  Plan  Clinical Impression Statement Pt tolerated well but was challenged with with functional strengthening activities requiring intermittent support for step ups/downs and bilateral UE support for 1/2 kneel <>stand.  Personal Factors and Comorbidities Age;Fitness;Comorbidity 1;Time since onset of injury/illness/exacerbation  Comorbidities prior CVA, hypothyroidism, elevated BMI  Examination-Activity Limitations Locomotion Level;Transfers;Stand;Stairs;Squat;Carry;Lift;Bend  Examination-Participation Restrictions Cleaning;Occupation;Community Activity;Shop;Yard Work  Pt will benefit from skilled therapeutic intervention in order to improve on the following deficits Difficulty walking;Decreased activity tolerance;Decreased balance;Decreased mobility;Decreased strength;Decreased cognition;Abnormal gait;Decreased safety awareness;Dizziness  Stability/Clinical Decision Making Evolving/Moderate complexity  Rehab Potential  Good  PT Frequency 1x / week  PT Duration 8 weeks  PT Treatment/Interventions ADLs/Self Care Home Management;Aquatic Therapy;Moist  Heat;DME Instruction;Gait training;Stair training;Functional mobility training;Therapeutic activities;Therapeutic exercise;Balance training;Neuromuscular re-education;Manual techniques;Patient/family education;Passive range of motion;Energy conservation  PT Next Visit Plan picking weight from floor ( for work readiness) step ups/downs .  balance with focus on SLS, RLE weight bearing, tandem, eyes closed, narrow BOS. RLE strength. NuStep/Scifit  PT Home Exercise Plan [id number]  Consulted and Agree with Plan of Care Patient     Patient will benefit from skilled therapeutic intervention in order to improve the following deficits and impairments:  Difficulty walking, Decreased activity tolerance, Decreased balance, Decreased mobility, Decreased strength, Decreased cognition, Abnormal gait, Decreased safety awareness, Dizziness  Visit Diagnosis: Hemiplegia and hemiparesis following cerebral infarction affecting right non-dominant side (HCC)  Other lack of coordination  Muscle weakness (generalized)  Unsteadiness on feet     Problem List Patient Active Problem List   Diagnosis Date Noted   Healthcare maintenance 03/07/2021   Acute DVT (deep venous thrombosis) ([place]) 10/06/2020   Elevated liver function tests 02/05/2020   Pre-diabetes 02/03/2020   Subclinical hypothyroidism 02/03/2020   Tobacco use disorder 01/27/2020   Hyperlipemia 01/27/2020   Obesity 01/27/2020   History of CVA with residual deficit 01/26/2020   Bjorn Loser, PTA  05/15/21, 12:50 PM   [facility] [address] [place], [place], [address] Phone: [phone]   Fax:  [phone]  Name: Maria Porter MRN: [id number] Date of Birth: 19-Apr-1976    Addendum by: Janann August, PT, DPT 05/24/21 11:25 AM

## 2021-05-15 NOTE — Therapy (Signed)
[facility] [address] [place], [place], [address] Phone: [phone]   Fax:  [phone]  Speech Language Pathology Evaluation  Patient Details  Name: Maria Porter MRN: [id number] Date of Birth: 1976-04-13 Referring Provider (SLP): Dr. Axel Filler   Encounter Date: 05/15/2021   End of Session - 05/15/21 1717     Visit Number 1    Number of Visits 10    Date for SLP Re-Evaluation 07/10/21    Authorization Type medicaid - is receiving PT and OT as well. As it is January, I will request 6 visits    Activity Tolerance Patient tolerated treatment well             Past Medical History:  Diagnosis Date   Stroke [facility])     Past Surgical History:  Procedure Laterality Date   BUBBLE STUDY  01/29/2020   Procedure: BUBBLE STUDY;  Surgeon: [name], MD;  Location: [facility];  Service: Cardiovascular;;   TEE WITHOUT CARDIOVERSION N/A 01/29/2020   Procedure: TRANSESOPHAGEAL ECHOCARDIOGRAM (TEE);  Surgeon: [name], MD;  Location: [facility] ENDOSCOPY;  Service: Cardiovascular;  Laterality: N/A;    There were no vitals filed for this visit.   Subjective Assessment - 05/15/21 1024     Subjective :I';m not [place]"    Currently in Pain? No/denies                SLP Evaluation [facility] - 05/15/21 1024       SLP Visit Information   SLP Received On 05/15/21    Referring Provider (SLP) Dr. Axel Filler    Onset Date 09/11/2020    Medical Diagnosis Left CVA      Subjective   Patient/Family Stated Goal "to get back to work"      General Information   HPI Maria Porter developed  sudden onset of aphasia and right hemiparesis and she was admitted to [facility]  on 09/16/2020 with NIH stroke scale 19.  She received IV tPA and CT angiogram showed left M1 occlusion she underwent mechanical thrombectomy which was successful after second pass with TICI 2b  revascularization. 2 prior left CVA's, most recent 01/26/20. 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    Mobility Status walks independently      Balance Screen   Has the patient fallen in the past 6 months No    Has the patient had a decrease in activity level because of a fear of falling?  --   On PT caseload     Prior Functional Status   Cognitive/Linguistic Baseline Within functional limits    Type of Home Apartment     Lives With Son    Available Support Family    Vocation On disability   Full time nurse prior to CVA     Cognition   Overall Cognitive Status Within Functional Limits for tasks assessed      Auditory Comprehension   Overall Auditory Comprehension Appears within functional limits for tasks assessed      Reading Comprehension   Reading Status Impaired    Word level 76-100% accurate    Sentence Level 76-100% accurate    Paragraph Level 76-100% accurate      Expression   Primary Mode of Expression Verbal      Verbal Expression   Overall Verbal Expression Impaired  Initiation No impairment    Level of Generative/Spontaneous Designer, television/film set    Responsive Not tested    Confrontation 75-100% accurate    Convergent Not tested    Divergent 0-24% accurate    Verbal Errors Phonemic paraphasias    Effective Techniques Phonemic cues;Sentence completion      Written Expression   Dominant Hand Left    Written Expression Exceptions to [facility]    Dictation Ability Sentence    Self Formulation Ability Sentence      Oral Motor/Sensory Function   Overall Oral Motor/Sensory Function Impaired    Labial ROM Reduced right    Labial Symmetry Abnormal symmetry right    Labial Strength Reduced Right    Labial Sensation Reduced Right    Labial Coordination Reduced    Lingual ROM Within Functional Limits    Velum Within Functional Limits    Mandible  Within Functional Limits      Motor Speech   Overall Motor Speech Impaired    Respiration Within functional limits    Phonation Normal    Resonance Within functional limits    Articulation Impaired    Level of Impairment Word    Intelligibility Intelligibility reduced    Word 75-100% accurate    Phrase 75-100% accurate    Sentence 75-100% accurate    Conversation 75-100% accurate    Motor Planning Witnin functional limits    Motor Speech Errors --   ? Foreign Accent Syndrome - [place] accent   Effective Techniques Slow rate;Pause      Standardized Assessments   Standardized Assessments  Boston Naming Test-2nd edition   50/60 (WNL 56.8 +- 3)                            SLP Education - 05/15/21 1224     Education Details [employer] apahsia group and aphasia study    Person(s) Educated Patient    Methods Explanation;Verbal cues;Handout    Comprehension Verbalized understanding;Need further instruction                                                  SLP Long Term Goals - 05/15/21 1731       SLP LONG TERM GOAL #1   Title Pt will carryover verbal and non verbal compensations for aphasia as needed over 18 minute moderately complex conversation with rare min A    Baseline no strategies, just giving up    Time 10    Period Weeks    Status New      SLP LONG TERM GOAL #2   Title Pt will name 15 items in personally relevant categories with occasional min A over 2 sessions    Baseline 4 items in a simple category    Time 10    Period Weeks   Status New      SLP LONG TERM GOAL #3   Title Pt will carryover compensations for dysarthria to be 100% intelligible over 20 minute conversation over 2 visits    Baseline I needed her to repeat herself 3x during evaluation    Time 10    Period Weeks    Status New      SLP LONG TERM GOAL #4   Title Pt will ID and self correct errors in  simple 3 -4 sentence written paragraph with occasional min A     Baseline did not ID or correct errors, no compensation for spelling difficulty    Time 10    Period Weeks   Status New      SLP LONG TERM GOAL #5   Title Pt will improve her score on the Communication Participation Item Bank short form by 2 points    Baseline initial score of 9    Time 10    Period Weeks    Status New              Plan - 05/15/21 1718     Clinical Impression Statement Maria Porter is referred for outpt ST by her PCP due to ongoing speech and language difficulties s/p Left CVA May 2022. Today she presents with mild aphasia (R47.01) affecting speaking, writing and reading comprehension. She presents with mild dysarthria (R47.1) affecting intelligilbity. I requested reptition 3x during eval, as she is not consistently carrying over compensations. She developed a [place] accent s/p CVA, this is disturbing to her.  Maria Porter states when she can't find a word she says "nevermind" or "just forget it" She scored a 50/60 on the [place] which is below WNL for her age. 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 She expresses concern that the aphasia may prevent her from doing this. She scored a 9 on the Communication Participation Item Bankk short form, indicating that she very much difficulty communicating quickly and quite a bit of difficulty in all other questions. She is not using strategies for word finding. Reading is taking her a long time. I provided a reading highlighter card, which she requested. Written expression is affected at sentence level, with grammar errors and spelling errors . She was not aware of the errors and did not use compensations for spelling. During conversation, she became tearful when she had word finding episodes. I recommend skilled ST to maximize communication for safety, QOL and possible  return to nursing if physical symptoms resolve.    Speech Therapy Frequency 1x /week    Duration 12 weeks    Treatment/Interventions Language facilitation;Environmental controls;Cueing hierarchy;Oral motor exercises;SLP instruction and feedback;Compensatory strategies;Functional tasks;Cognitive reorganization;Compensatory techniques;Patient/family education;Multimodal communcation approach;Internal/external aids             Patient will benefit from skilled therapeutic intervention in order to improve the following deficits and impairments:   Aphasia  Dysarthria and anarthria    Problem List Patient Active Problem List   Diagnosis Date Noted   Healthcare maintenance 03/07/2021   Acute DVT (deep venous thrombosis) ([place]) 10/06/2020   Elevated liver function tests 02/05/2020   Pre-diabetes 02/03/2020   Subclinical hypothyroidism 02/03/2020   Tobacco use disorder 01/27/2020   Hyperlipemia 01/27/2020   Obesity 01/27/2020   History of CVA with residual deficit 01/26/2020    Maria Porter, Maria Porter, CCC-SLP 05/15/2021, 5:38 PM  [facility] [address] [place], [place], [address] Phone: [phone]   Fax:  [phone]  Name: Maria Porter MRN: [id number] Date of Birth: 11/16/1975

## 2021-05-16 ENCOUNTER — Ambulatory Visit: Payer: Medicaid Other | Admitting: Neurology

## 2021-05-16 ENCOUNTER — Encounter: Payer: Self-pay | Admitting: Neurology

## 2021-05-16 VITALS — BP 116/84 | HR 98 | Ht 66.0 in | Wt 201.0 lb

## 2021-05-16 DIAGNOSIS — G8110 Spastic hemiplegia affecting unspecified side: Secondary | ICD-10-CM | POA: Diagnosis not present

## 2021-05-16 DIAGNOSIS — I69359 Hemiplegia and hemiparesis following cerebral infarction affecting unspecified side: Secondary | ICD-10-CM

## 2021-05-16 NOTE — Patient Instructions (Signed)
I had a long d/w patient about remote strokes and spastic right hemiparesis, risk for recurrent stroke/TIAs, personally independently reviewed imaging studies and stroke evaluation results and answered questions.Continue Eliquis (apixaban) daily  for secondary stroke prevention given recent history of DVT and maintain strict control of hypertension with blood pressure goal below 130/90, diabetes with hemoglobin A1c goal below 6.5% and lipids with LDL cholesterol goal below 70 mg/dL. I also advised the patient to eat a healthy diet with plenty of whole grains, cereals, fruits and vegetables, exercise regularly and maintain ideal body weight I advised her to see her primary physician and if Eliquis is discontinued she may stay on aspirin 81 mg alone for secondary stroke prevention.  She was counseled to quit smoking and she states she is trying.  She may start driving but I advised her to drive limited on not very busy roads and preferably have somebody with her.  Followup in the future with my nurse practitioner in 6 months or call earlier if necessary.

## 2021-05-16 NOTE — Progress Notes (Signed)
[facility] [address] [place]. [place] [address] [phone]       OFFICE FOLLOW UP VISIT NOTE  Ms. [name] Date of Birth:  Jul 16, 1975 Medical Record Number:  [id number]   Referring MD: Axel Filler  Reason for Referral: Stroke  HPI: Initial visit 11/01/2020 Ms. Hiltunen is a 46 year old African-American lady seen today for initial office consultation visit for stroke.  History is obtained from the patient and review of electronic medical records as well as Care Everywhere and I personally reviewed pertinent imaging films in PACS.  She has past medical history of remote left frontal stroke, obesity, hyperlipidemia who presented initially on 01/26/2020 with sudden onset aphasia and right hemiparesis while at home.  CT scan of the head on admission in the ED showed old left frontal infarct but CT angiogram was negative for LVO and CT perfusion showed no mismatch.  Symptoms are improving initial NIH stroke scale was 9 and improved to 3.  tPA was not administered due to rapidly improving symptoms.  MRI scan showed left frontal cortical and subcortical infarct adjacent to the old infarct.  MRI also showed old bilateral frontal and left inferior cerebellar infarcts of remote age.  MRA of the brain and neck showed no significant large vessel stenosis or occlusion.  EEG showed no seizure activity.  2D echo showed ejection fraction of 55 to 60%.  TCD bubble studies was positive with a few hits noted only after Valsalva history of her foot reveals clinically insignificant PFO.  TEE also showed a small right-to-left shunt but no cardiac source of embolism.  Lower extremity Dopplers were negative for DVT.  Spinal tap was obtained which showed WBC 3, red cells 10, glucose 72 and protein 44 mg percent and no evidence of infection.  LDL cholesterol is 125 mg percent and hemoglobin A1c was 5.8.  Hypercoagulable panel labs are negative except for slightly elevated anticardiolipin  IgM antibody of 14.  ESR and C-reactive protein B12 are normal.  Patient was started on aspirin and Plavix for 3 weeks followed by Plavix alone.  Tested positive for sleep apnea on the NOx 3 monitor and participated in the sleep smart study and was randomized to the medical treatment.  Patient states she is she was doing well after that but while in [place] she developed again sudden onset of aphasia and right hemiparesis and she was admitted to [facility]  on 09/16/2020 with NIH stroke scale 19.  She received IV tPA and CT angiogram showed left M1 occlusion she underwent mechanical thrombectomy which was successful after second pass with TICI 2b revascularization.  2D echo and TEE were repeated which were unremarkable.  Lower extremity venous Dopplers on 09/13/2020 showed acute DVT in the right external iliac vein.  She was started on Eliquis since then.  She states she started Eliquis well without bruising or bleeding.  She states aphasia is improving though she still has significant hesitancy and struggles to speak and has to speak slowly.  Her comprehension is quite good.  Right-sided strength is improving though she has still weakness in the right grip and intrinsic hand muscles.  She is able to ambulate independently.  Patient has moved recently to [facility] and is now living with her sister.  She has had normal recurrent stroke or TIA symptoms. Update 05/16/2021 : She returns for follow-up after last visit 6 months ago.  She states she is doing well and has not had any recurrent stroke or  TIA symptoms.  She is participating in outpatient physical occupational and has recently started speech therapy which seems to be helping her.  She is met most of her goals.  She still has a different [place] accent which has not resolved.  She can walk short distances indoors without a cane but uses a cane for long distances.  She remains on Eliquis for DVT which she is tolerating well  without bruising or bleeding.  Her blood pressure is under good control today it is 116/84.  She is tolerating Lipitor well without muscle aches and pains.  She continues to smoke but is trying to quit and primary physician started her on NicoDerm patch.  She is started eating healthy and is losing some weight.  She did not make an appointment to see Dr. Brett Fairy or Rexene Alberts whom I referred her to at last visit for sleep apnea but is now willing to do so.  She continues to have mild expressive aphasia and spastic right hemiplegia which is unchanged.  She now wants to drive. ROS:   14 system review of systems is positive for aphasia, speech difficulty, weakness, gait difficulty, sleep apnea and all other systems negative  PMH:  Past Medical History:  Diagnosis Date   Stroke [facility])     Social History:  Social History   Socioeconomic History   Marital status: Single    Spouse name: Not on file   Number of children: Not on file   Years of education: Not on file   Highest education level: Not on file  Occupational History   Not on file  Tobacco Use   Smoking status: Every Day    Packs/day: 1.00    Types: Cigarettes    Start date: 03/07/2020   Smokeless tobacco: Never   Tobacco comments:    1 pk per day   Substance and Sexual Activity   Alcohol use: Yes   Drug use: Not on file   Sexual activity: Not on file  Other Topics Concern   Not on file  Social History Narrative   Lives with sister   Left Handed   Drinks 2-3 cups caffeine daily   Social Determinants of Health   Financial Resource Strain: Not on file  Food Insecurity: Not on file  Transportation Needs: Not on file  Physical Activity: Not on file  Stress: Not on file  Social Connections: Not on file  Intimate Partner Violence: Not on file    Medications:   Current Outpatient Medications on File Prior to Visit  Medication Sig Dispense Refill   albuterol (VENTOLIN HFA) 108 (90 Base) MCG/ACT inhaler Inhale 1-2 puffs into  the lungs every 6 (six) hours as needed for wheezing or shortness of breath. 8.5 g 3   atorvastatin (LIPITOR) 40 MG tablet TAKE 1 TABLET BY MOUTH EVERY DAY 180 tablet 3   ELIQUIS 5 MG TABS tablet TAKE 1 TABLET BY MOUTH TWICE A DAY 180 tablet 0   levothyroxine (SYNTHROID) 150 MCG tablet TAKE 1 TABLET BY MOUTH EVERY DAY 90 tablet 0   nicotine (NICODERM CQ - DOSED IN MG/24 HOURS) 21 mg/24hr patch Place 1 patch (21 mg total) onto the skin daily. 30 patch 1   No current facility-administered medications on file prior to visit.    Allergies:  No Known Allergies  Physical Exam General: Obese middle-aged African-American lady d, seated, in no evident distress Head: head normocephalic and atraumatic.   Neck: supple with no carotid or supraclavicular bruits Cardiovascular: regular rate  and rhythm, no murmurs Musculoskeletal: no deformity Skin:  no rash/petichiae Vascular:  Normal pulses all extremities  Neurologic Exam Mental Status: Awake and fully alert. Oriented to place and time. Recent and remote memory intact. Attention span, concentration and fund of knowledge appropriate. Mood and affect appropriate.  Mild expressive aphasia with nonfluent speech and some word finding difficulties.  Able to name repeat and comprehend well. Cranial Nerves: Fundoscopic exam not done. Pupils equal, briskly reactive to light. Extraocular movements full without nystagmus. Visual fields full to confrontation. Hearing intact.  Moderate right lower facial weakness.  Sensation intact. Face, tongue, palate moves normally and symmetrically.  Motor: Normal strength on the left side..  Moderate right lower extremity and mild right upper extremity drift.  Weakness of right grip and intrinsic hand muscles, right hip flexors and ankle dorsiflexors.  Right hemiparesis 3-4/5 right upper extremity and 3/5 right lower extremity strength.  Tone increased on the right compared to the left. Sensory.: intact to touch , pinprick ,  position and vibratory sensation.  Coordination: Rapid alternating movements normal in all extremities. Finger-to-nose and heel-to-shin performed accurately bilaterally. Gait and Station: Arises from chair without difficulty. Stance is normal. Gait demonstrates mild spastic ataxic gait with partial right foot drop and uses a cane.   Reflexes: 2+ and asymmetric and brisker on the right. Toes downgoing.   NIHSS  6 Modified Rankin  3   ASSESSMENT: 46 year old lady with left frontal MCA branch infarct and September 2021 followed by more recent left MCA infarct in May 2022 treated with IV tPA followed by successful mechanical thrombectomy  (at [facility] )with excellent clinical outcome.  Patient was also found to have acute DVT in the lower extremities in May 2022.  Vascular risk factors of hyperlipidemia, obesity and sleep apnea.  She was participating in the sleep smart study and was randomized to the medical treatment done but has  met end of study due to recurrent stroke.     PLAN: I had a long d/w patient about remote strokes and spastic right hemiparesis, risk for recurrent stroke/TIAs, personally independently reviewed imaging studies and stroke evaluation results and answered questions.Continue Eliquis (apixaban) daily  for secondary stroke prevention given recent history of DVT and maintain strict control of hypertension with blood pressure goal below 130/90, diabetes with hemoglobin A1c goal below 6.5% and lipids with LDL cholesterol goal below 70 mg/dL. I also advised the patient to eat a healthy diet with plenty of whole grains, cereals, fruits and vegetables, exercise regularly and maintain ideal body weight I advised her to see her primary physician and if Eliquis is discontinued she may stay on aspirin 81 mg alone for secondary stroke prevention.  She was counseled to quit smoking and she states she is trying.  She may start driving but I advised her to drive limited  on not very busy roads and preferably have somebody with her.  Followup in the future with my nurse practitioner in 6 months or call earlier if necessary. Continue ongoing outpatient physical ,occupational and speech therapy.  Referred to Dr. Dohmeier/Dr. Rexene Alberts for treatment for his sleep apnea.  Now that she has had recurrent stroke and met the study endpoint.  Followup in the future with me in 6 months or call earlier if necessary.  Greater than 50% time during this 45-minute consultation visit was spent on counseling and coordination of care about her recurrent cryptogenic strokes and sleep apnea and answering questions. Antony Contras, MD  Note:  This document was prepared with digital dictation and possible smart phrase technology. Any transcriptional errors that result from this process are unintentional.

## 2021-05-18 ENCOUNTER — Encounter: Payer: Medicaid Other | Admitting: Occupational Therapy

## 2021-05-23 ENCOUNTER — Other Ambulatory Visit: Payer: Self-pay | Admitting: Internal Medicine

## 2021-05-23 ENCOUNTER — Ambulatory Visit (HOSPITAL_COMMUNITY)
Admission: RE | Admit: 2021-05-23 | Discharge: 2021-05-23 | Disposition: A | Discharge status: Home / Self Care | Payer: Medicaid Other | Source: Ambulatory Visit | Attending: Internal Medicine | Admitting: Internal Medicine

## 2021-05-23 ENCOUNTER — Ambulatory Visit (INDEPENDENT_AMBULATORY_CARE_PROVIDER_SITE_OTHER): Payer: Medicaid Other | Admitting: Internal Medicine

## 2021-05-23 ENCOUNTER — Encounter: Payer: Self-pay | Admitting: Internal Medicine

## 2021-05-23 ENCOUNTER — Other Ambulatory Visit: Payer: Self-pay

## 2021-05-23 VITALS — BP 114/81 | HR 75 | Temp 97.9°F | Ht 66.0 in | Wt 199.3 lb

## 2021-05-23 DIAGNOSIS — G8929 Other chronic pain: Secondary | ICD-10-CM | POA: Insufficient documentation

## 2021-05-23 DIAGNOSIS — Z1211 Encounter for screening for malignant neoplasm of colon: Secondary | ICD-10-CM | POA: Diagnosis not present

## 2021-05-23 DIAGNOSIS — M25511 Pain in right shoulder: Secondary | ICD-10-CM | POA: Diagnosis not present

## 2021-05-23 DIAGNOSIS — J45909 Unspecified asthma, uncomplicated: Secondary | ICD-10-CM

## 2021-05-23 DIAGNOSIS — Z0000 Encounter for general adult medical examination without abnormal findings: Secondary | ICD-10-CM

## 2021-05-23 DIAGNOSIS — I6930 Unspecified sequelae of cerebral infarction: Secondary | ICD-10-CM | POA: Diagnosis not present

## 2021-05-23 MED ORDER — NAPROXEN 500 MG PO TABS: 500.0000 mg | ORAL_TABLET | Freq: Two times a day (BID) | ORAL | 0 refills | Status: AC

## 2021-05-23 MED ORDER — ALBUTEROL SULFATE (2.5 MG/3ML) 0.083% IN NEBU: 2.5000 mg | INHALATION_SOLUTION | RESPIRATORY_TRACT | 2 refills | Status: DC | PRN

## 2021-05-23 NOTE — Addendum Note (Signed)
Addended by: Jaci Standard on: 05/23/2021 12:44 PM   Modules accepted: Orders

## 2021-05-23 NOTE — Assessment & Plan Note (Addendum)
Referral to GI for colonoscopy placed.  Patient to schedule Pap smear.

## 2021-05-23 NOTE — Patient Instructions (Signed)
I have ordered a right shoulder xray. I will call you with the results.   I also want you to try taking Naproxen 500 mg twice daily with meals for 2 weeks.   I have also placed a referral for a colonoscopy.  Please schedule a pap smear at your earlier convenience.

## 2021-05-23 NOTE — Assessment & Plan Note (Addendum)
Patient endorses chronic right shoulder pain since her last stroke in May 2022.  She denies any falls, injuries or trauma to the right shoulder.  She endorses a sharp achy pain to her right shoulder.  She states that she initially had significant weakness of her right upper extremity and has been doing physical therapy with much improvement.  She is now able to lift her arm to about 90 degrees but unable to lift it above her shoulder.  Denies any difficulty with grip strength or dropping things.  Denies any numbness or tingling.    On exam no tenderness to palpation of the cervical spine or right shoulder.  Active and passive range of motion limited due to pain.  Patient unable to AB duct above 90 degrees.  No swelling or erythema.  Negative empty can and Hawkins tests.  Assessment/plan: Differential diagnosis includes adhesive capsulitis versus a bursitis.  Ordered a right shoulder radiograph and 2 weeks of NSAID.  We will obtain a more recent BMP to make sure naproxen will be safe.  -Right shoulder radiograph -Follow-up BMP - 2 weeks of naproxen 500 mg twice daily -Continue physical therapy

## 2021-05-23 NOTE — Progress Notes (Signed)
° °  CC: right arm pain  HPI:  [name] is a 46 y.o. with a past medical history listed below presenting for right arm pain. For details of today's visit and the status of his chronic medical issues please refer to the assessment and plan.   Past Medical History:  Diagnosis Date   Stroke [facility])    Review of Systems:   Review of Systems  Musculoskeletal:  Positive for joint pain, myalgias and neck pain. Negative for falls.  Neurological:  Negative for tingling and weakness.  Psychiatric/Behavioral:  The patient has insomnia.     Physical Exam:  Vitals:   05/23/21 0941  BP: 114/81  Pulse: 75  Temp: 97.9 F (36.6 C)  TempSrc: Oral  SpO2: 99%  Weight: 199 lb 4.8 oz (90.4 kg)  Height: 5\' 6"  (1.676 m)   Physical Exam General: alert, appears stated age, in no acute distress HEENT: Normocephalic, atraumatic, EOM intact, conjunctiva normal CV: Regular rate and rhythm, no murmurs rubs or gallops Pulm: Clear to auscultation bilaterally, normal work of breathing Abdomen: Soft, nondistended, bowel sounds present, no tenderness to palpation MSK: No lower extremity edema, limited active and passive range of motion of right upper extremity, no tenderness to palpation of the right shoulder, negative empty can and hawkins test Skin: Warm and dry Neuro: Alert and oriented x3, 4/5 strength of right UE, 5/5 of LUE  Assessment & Plan:   See Encounters Tab for problem based charting.  Patient discussed with Dr. 

## 2021-05-24 ENCOUNTER — Ambulatory Visit: Payer: Medicaid Other | Admitting: Speech Pathology

## 2021-05-24 ENCOUNTER — Encounter: Payer: Self-pay | Admitting: Physical Therapy

## 2021-05-24 ENCOUNTER — Ambulatory Visit: Payer: Medicaid Other | Admitting: Occupational Therapy

## 2021-05-24 ENCOUNTER — Encounter: Payer: Self-pay | Admitting: Speech Pathology

## 2021-05-24 ENCOUNTER — Ambulatory Visit: Payer: Medicaid Other | Admitting: Physical Therapy

## 2021-05-24 DIAGNOSIS — R278 Other lack of coordination: Secondary | ICD-10-CM

## 2021-05-24 DIAGNOSIS — I69353 Hemiplegia and hemiparesis following cerebral infarction affecting right non-dominant side: Secondary | ICD-10-CM | POA: Diagnosis not present

## 2021-05-24 DIAGNOSIS — R4701 Aphasia: Secondary | ICD-10-CM

## 2021-05-24 DIAGNOSIS — M6281 Muscle weakness (generalized): Secondary | ICD-10-CM

## 2021-05-24 LAB — BMP8+ANION GAP
Anion Gap: 17 mmol/L (ref 10.0–18.0)
BUN/Creatinine Ratio: 11 (ref 9–23)
BUN: 9 mg/dL (ref 6–24)
CO2: 22 mmol/L (ref 20–29)
Calcium: 9.5 mg/dL (ref 8.7–10.2)
Chloride: 102 mmol/L (ref 96–106)
Creatinine, Ser: 0.81 mg/dL (ref 0.57–1.00)
Glucose: 69 mg/dL — ABNORMAL LOW (ref 70–99)
Potassium: 4.4 mmol/L (ref 3.5–5.2)
Sodium: 141 mmol/L (ref 134–144)
eGFR: 90 mL/min/{1.73_m2} (ref >59)

## 2021-05-24 NOTE — Therapy (Signed)
[facility] [address] [place], [place], [address] Phone: [phone]   Fax:  [phone]  Physical Therapy Treatment  Patient Details  Name: Maria OliveDanielle Botello MRN: [id number] Date of Birth: 31-May-1975 Referring Provider (PT): Tyson AliasVincent, Duncan Thomas, MD   Encounter Date: 05/24/2021   PT End of Session - 05/24/21 1139     Visit Number 9    Number of Visits 15    Date for PT Re-Evaluation 07/05/21    Authorization Type [employer] Medicaid - 27 visit limit for 2023    Authorization Time Period Auth not required    Authorization - Visit Number 2    Authorization - Number of Visits 8    Progress Note Due on Visit 10    PT Start Time 1107    PT Stop Time 1150    PT Time Calculation (min) 43 min    Behavior [facility] Therapy [facility] for tasks assessed/performed             Past Medical History:  Diagnosis Date   Acute deep vein thrombosis of right iliac vein (HCC) 09/14/2020   Stroke [facility](HCC)     Past Surgical History:  Procedure Laterality Date   BUBBLE STUDY  01/29/2020   Procedure: BUBBLE STUDY;  Surgeon: Little IshikawaSchumann, Christopher L, MD;  [facility];  Service: Cardiovascular;;   TEE WITHOUT CARDIOVERSION N/A 01/29/2020   Procedure: TRANSESOPHAGEAL ECHOCARDIOGRAM (TEE);  Surgeon: Little IshikawaSchumann, Christopher L, MD;  [facility];  Service: Cardiovascular;  Laterality: N/A;    There were no vitals filed for this visit.   Subjective Assessment - 05/24/21 1108     Subjective Since last visit, Pt walked around apartment parking lot for exercise x2 with son, [facility] but still feels more comfortable with SPC when there's a lot of people.    Pertinent History prior CVA, hypothyroidism, elevated BMI    How long can you stand comfortably? No issues    Currently in Pain? No/denies                               [facility]/Exercise - 05/24/21 0001       Ambulation/Gait    Ambulation/Gait Yes    Ambulation/Gait Assistance 5: Supervision   Working on increasing confidence with walking without SPC longer community distance while navigating multi outdoor surfaces, changes in speed, and direction, and head turns.   Ambulation/Gait Assistance Details >1000    Assistive device None    Gait Pattern Step-through pattern;Decreased weight shift to right    Ambulation Surface Outdoor;Paved;Gravel;Grass    Stairs Yes    Stairs Assistance 6: Modified independent (Device/Increase time)    Stairs Assistance Details (indicate cue type and reason) working on greater control descending with reciprocal pattern decreasing UE support to none.    Stair Management Technique One rail Right;No rails;Alternating pattern    Number of Stairs 4   x5   Height of Stairs 6    Ramp 6: Modified independent (Device)    Curb 6: Modified independent (Device/increase time)      Neuro Re-ed    Neuro Re-ed Details  1.1/2 kneel: Alt foot in front  with alt UE raises, 1 UE support. 2. tall kneel to squat holding kettle bell 3. alt lunge with 1 UE support. cues for posture  PT Short Term Goals - 05/24/21 1119       PT SHORT TERM GOAL #1   Title Pt will undergo further assessment of FGA with LTG written. ALL STGS DUE 06/07/21    Baseline not yet assessed.    Time 4    Period Weeks    Status New    Target Date 06/07/21      PT SHORT TERM GOAL #2   Title Pt will have improved 5x STS to </=20 sec    Baseline 30.1 s with UE support, 22 seconds with BUE support on 04/24/21    Time 4    Period Weeks    Status New      PT SHORT TERM GOAL #3   Title Pt will decr TUG to 14 seconds with no AD in order to demo decr fall risk.    Baseline 16.21 seconds with no AD    Time 4    Period Weeks    Status New      PT SHORT TERM GOAL #4   Title Pt will undergo further assessment of gait speed with no AD with LTG written.    Baseline not yet assessed.    Time 4     Period Weeks    Status New               PT Long Term Goals - 05/24/21 1122       PT LONG TERM GOAL #1   Title Pt will be independent with self progressing her strengthening HEP. ALL LTGS DUE 07/05/21    Baseline [id number]; pt reports consistency with her HEP, will benefit from additions/review    Time 8    Period Weeks    Status On-going    Target Date 07/05/21      PT LONG TERM GOAL #2   Title Pt will have improved 5x STS </=15 sec to demo low fall risk    Baseline 30.1s; 22 seconds with BUE support on 04/24/21    Time 8    Period Weeks    Status On-going      PT LONG TERM GOAL #3   Title Pt will have improved TUG score of </=13.5 sec to demo low fall risk with no A/D    Baseline TUG score 27s w/o AD; 16.21 seconds no AD    Time 8    Period Weeks    Status Revised      PT LONG TERM GOAL #4   Title FGA goal to be written as appropriate.    Baseline not yet assessed.    Time 8    Period Weeks    Status New      PT LONG TERM GOAL #5   Title Gait speed goal with no AD to be written.    Baseline not yet assessed.    Time 8    Period Weeks    Status New                   Plan - 05/24/21 1201     Clinical Impression Statement Pt is progressing with confidence with community  gait with AD and stair negotiation with less UE support. Pt continues to need at least 1 UE support for floor transfer but demonstrates decreased UE support needed overall.    Personal Factors and Comorbidities Age;Fitness;Comorbidity 1;Time since onset of injury/illness/exacerbation    Comorbidities prior CVA, hypothyroidism, elevated BMI    Examination-Activity Limitations Locomotion Level;Transfers;Stand;Stairs;Squat;Carry;Lift;Bend  Examination-Participation Restrictions Cleaning;Occupation;Community Activity;Shop;Yard Work    Conservation officer, historic buildings Evolving/Moderate complexity    Rehab Potential Good    PT Frequency 1x / week    PT Duration 8 weeks    PT  Treatment/Interventions ADLs/Self Care Home Management;Aquatic Therapy;Moist Heat;DME Instruction;Gait training;Stair training;Functional mobility training;Therapeutic activities;Therapeutic exercise;Balance training;Neuromuscular re-education;Manual techniques;Patient/family education;Passive range of motion;Energy conservation    PT Next Visit Plan picking weight from floor ( for work readiness) step ups/downs .  balance with focus on SLS, RLE weight bearing, tandem, eyes closed, narrow BOS. RLE strength. NuStep/Scifit    PT Home Exercise Plan [id number]    Consulted and Agree with Plan of Care Patient             Patient will benefit from skilled therapeutic intervention in order to improve the following deficits and impairments:  Difficulty walking, Decreased activity tolerance, Decreased balance, Decreased mobility, Decreased strength, Decreased cognition, Abnormal gait, Decreased safety awareness, Dizziness  Visit Diagnosis: Muscle weakness (generalized)     Problem List Patient Active Problem List   Diagnosis Date Noted   Right shoulder pain 05/23/2021   Healthcare maintenance 03/07/2021   History of DVT (deep vein thrombosis) 10/06/2020   Family history of premature coronary heart disease 09/14/2020   OSA (obstructive sleep apnea) 09/14/2020   Smokes less than 1/2 pack a day with greater than 15 pack year history 09/14/2020   Elevated liver function tests 02/05/2020   Pre-diabetes 02/03/2020   Subclinical hypothyroidism 02/03/2020   Tobacco use disorder 01/27/2020   Hyperlipemia 01/27/2020   Obesity 01/27/2020   History of CVA with residual deficit 01/26/2020   Extrinsic asthma 10/09/2004    Hortencia Conradi, PTA  05/24/21, 12:06 PM   [facility] [address] [place], [place], [address] Phone: [phone]   Fax:  [phone]  Name: Shen[place]ah Yeats MRN: [id number] Date of Birth: Jan 18, 1976

## 2021-05-24 NOTE — Addendum Note (Signed)
Addended by: Drake Leach on: 05/24/2021 11:24 AM   Modules accepted: Orders

## 2021-05-24 NOTE — Therapy (Signed)
[facility] [address] [place], [place], [address] Phone: [phone]   Fax:  [phone]  Occupational Therapy Treatment  Patient Details  Name: Nishtha Raider MRN: [id number] Date of Birth: 08-12-1975 Referring Provider (OT): Dr. Evette Doffing   Encounter Date: 05/24/2021   OT End of Session - 05/24/21 0958     Visit Number 11    Number of Visits 25    Date for OT Re-Evaluation 06/21/21    Authorization Type [employer] Medicaid    OT Start Time [phone]    OT Stop Time 1015    OT Time Calculation (min) 37 min    Activity Tolerance Patient tolerated treatment well    Behavior During Therapy [facility] for tasks assessed/performed             Past Medical History:  Diagnosis Date   Acute deep vein thrombosis of right iliac vein ([place]) 09/14/2020   Stroke [facility])     Past Surgical History:  Procedure Laterality Date   BUBBLE STUDY  01/29/2020   Procedure: BUBBLE STUDY;  Surgeon: [name], MD;  Location: [facility];  Service: Cardiovascular;;   TEE WITHOUT CARDIOVERSION N/A 01/29/2020   Procedure: TRANSESOPHAGEAL ECHOCARDIOGRAM (TEE);  Surgeon: [name], MD;  Location: [facility] ENDOSCOPY;  Service: Cardiovascular;  Laterality: N/A;    There were no vitals filed for this visit.   Subjective Assessment - 05/24/21 0940     Subjective  I went to the doctor yesterday and they xrayed my shoulder and gave me 2 weeks of naproxen. I will get results in 3 days    Pertinent History CVA      PMH:  hx of prior CVA (approx 4 yrs ago affecting L side, 01/2020 affecting R side, and 09/11/20 affecting R side); obesity, hyperlipidemia, sleep apnea    Currently in Pain? No/denies              Assessed progress towards goals including: Box & Blocks, 9 hole peg test, and grip strength. See below for updates  Gripper set at level 1 resistance to pick up blocks for sustained grip strength Rt hand  Table slides w/ palms  facing for sh flexion, and diagonal patterns along table  UBE x 8 min, level 1 for reciprocal movement pattern                      OT Short Term Goals - 05/24/21 0959       OT SHORT TERM GOAL #1   Title Pt will be independent with initial HEP.    Baseline no HEP    Time 4    Period Weeks    Status Achieved      OT SHORT TERM GOAL #2   Title Pt will demo at least 85* R shoulder flex for functional reach.    Baseline 60-70*    Time 4    Period Weeks    Status Achieved   04/27/21:  85* with 1-2/10 pain     OT SHORT TERM GOAL #3   Title Pt will improve R hand coordination for ADLs/IADLs as shown by improving score on box and blocks test by at least 10 blocks.    Baseline 27 blocks    Time 4    Period Weeks    Status Achieved   04/27/21:  33 blocks, 05/24/21: 46 blocks     OT SHORT TERM GOAL #4   Title Pt will improve R hand grip  strength to at least 15 lbs to assist in opening bottles/containers.    Baseline 6.1 lbs    Time 4    Period Weeks    Status On-going   04/27/21:  9.9lbs, 05/24/21: 12 lbs              OT Long Term Goals - 05/24/21 1000       OT LONG TERM GOAL #1   Title Pt will verbalize understanding of AE/strategies for incr safety/independence with ADLs/IADLs  (including cognitive and visual strategies prn, and for opening jars/medicine bottles)    Baseline dependent, needs education    Time 12    Period Weeks    Status New      OT LONG TERM GOAL #2   Title Pt will demo at least 90* R shoulder flex for functional reach.    Baseline 60-70*    Time 12    Period Weeks    Status On-going   80* - 85*     OT LONG TERM GOAL #3   Title Pt will improve R hand coordination for ADLs/IADLs and leisure tasks as shown by completing 9-hole peg test in 30 sec or less.    Baseline 35.94 sec    Time 12    Period Weeks    Status Achieved   27 sec     OT LONG TERM GOAL #4   Title Pt will improve R hand grip strength to at least 20 lbs to assist  in opening bottles/containers.    Baseline 6.1 lbs    Time 12    Period Weeks    Status On-going      OT LONG TERM GOAL #5   Title Pt will tolerate shoulder P/ROM to 110* w/ pain less than or equal to 3/10    Baseline only to 85/90*    Time 12    Period Weeks    Status On-going                   Plan - 05/24/21 1001     Clinical Impression Statement Pt progressing towards goals. Pt with improved coordination (goal met) and approximating grip strength    OT Occupational Profile and History Detailed Assessment- Review of Records and additional review of physical, cognitive, psychosocial history related to current functional performance    Occupational performance deficits (Please refer to evaluation for details): ADL's;IADL's;Leisure    Body Structure / Function / Physical Skills ADL;Decreased knowledge of use of DME;Strength;Dexterity;Balance;UE functional use;Proprioception;ROM;IADL;Vision;Sensation;Mobility;Coordination;FMC    Cognitive Skills Attention;Memory;Problem Solve;Sequencing;Safety Awareness    Rehab Potential Good    Clinical Decision Making Several treatment options, min-mod task modification necessary    Comorbidities Affecting Occupational Performance: May have comorbidities impacting occupational performance    Modification or Assistance to Complete Evaluation  Min-Moderate modification of tasks or assist with assess necessary to complete eval    OT Frequency 2x / week    OT Duration 12 weeks   PLUS EVAL (anticipate only 8 weeks)   OT Treatment/Interventions Self-care/ADL training;Moist Heat;Fluidtherapy;Splinting;Balance training;Therapeutic activities;Therapeutic exercise;Ultrasound;Cognitive remediation/compensation;Visual/perceptual remediation/compensation;Passive range of motion;Functional Mobility Training;Neuromuscular education;Cryotherapy;Energy conservation;Manual Therapy;Patient/family education;Paraffin;DME and/or AE instruction;Coping strategies  training    Plan continue NMR Rt shoulder mid range flex, and work on sh abduction low ranges    Recommended Other Services Speech therapy - to schedule beginning in January when visits start over    Consulted and Agree with Plan of Care Patient  Patient will benefit from skilled therapeutic intervention in order to improve the following deficits and impairments:   Body Structure / Function / Physical Skills: ADL, Decreased knowledge of use of DME, Strength, Dexterity, Balance, UE functional use, Proprioception, ROM, IADL, Vision, Sensation, Mobility, Coordination, [facility] Cognitive Skills: Attention, Memory, Problem Solve, Sequencing, Safety Awareness     Visit Diagnosis: Hemiplegia and hemiparesis following cerebral infarction affecting right non-dominant side (HCC)  Other lack of coordination    Problem List Patient Active Problem List   Diagnosis Date Noted   Right shoulder pain 05/23/2021   Healthcare maintenance 03/07/2021   History of DVT (deep vein thrombosis) 10/06/2020   Family history of premature coronary heart disease 09/14/2020   OSA (obstructive sleep apnea) 09/14/2020   Smokes less than 1/2 pack a day with greater than 15 pack year history 09/14/2020   Elevated liver function tests 02/05/2020   Pre-diabetes 02/03/2020   Subclinical hypothyroidism 02/03/2020   Tobacco use disorder 01/27/2020   Hyperlipemia 01/27/2020   Obesity 01/27/2020   History of CVA with residual deficit 01/26/2020   Extrinsic asthma 10/09/2004    Carey Bullocks, OTR/L 05/24/2021, 10:05 AM  [facility] [address] [place], [place], [address] Phone: [phone]   Fax:  [phone]  Name: Eulah Walkup MRN: [id number] Date of Birth: April 09, 1976

## 2021-05-24 NOTE — Therapy (Signed)
[facility] [address] [place], [place], [address] Phone: [phone]   Fax:  [phone]  Speech Language Pathology Treatment  Patient Details  Name: Maria Porter MRN: [id number] Date of Birth: [birth year] Referring Provider (SLP): Dr. Tyson Alias   Encounter Date: 05/24/2021   End of Session - 05/24/21 1602     Visit Number 2    Number of Visits 10    Date for SLP Re-Evaluation 07/10/21    Authorization Type medicaid - is receiving PT and OT as well. As it is January, I will request 6 visits    SLP Start Time 0930    SLP Stop Time  1015    SLP Time Calculation (min) 45 min    Activity Tolerance Patient tolerated treatment well             Past Medical History:  Diagnosis Date   Acute deep vein thrombosis of right iliac vein (HCC) 09/14/2020   Stroke [facility])     Past Surgical History:  Procedure Laterality Date   BUBBLE STUDY  01/29/2020   Procedure: BUBBLE STUDY;  Surgeon: Little Ishikawa, MD;  Location[facility] ENDOSCOPY;  Service: Cardiovascular;;   TEE WITHOUT CARDIOVERSION N/A 01/29/2020   Procedure: TRANSESOPHAGEAL ECHOCARDIOGRAM (TEE);  Surgeon: Little Ishikawa, MD;  Location[facility] ENDOSCOPY;  Service: Cardiovascular;  Laterality: N/A;    There were no vitals filed for this visit.   Subjective Assessment - 05/24/21 1019     Subjective "I called the lady you told me to and she wants me to talk to her students"    Currently in Pain? No/denies                   ADULT SLP TREATMENT - 05/24/21 1024       General Information   Behavior/Cognition Alert;Cooperative;Pleasant mood      Cognitive-Linquistic Treatment   Treatment focused on Dysarthria;Patient/family/caregiver education;Aphasia    Skilled Treatment Maria Porter needed help putting Zoom on her phone to join aphasia group.Targeted written expression and naming generating list of medications she frequently gave as  Charity fundraiser. Maria Porter generated 14 common medications with sematnic cues for 10/14 and extended time frequently. Written expression at sentence level targeted writing sentences with multiple meaning words. Maria Porter required extended time and pauses for spelling, however she wrote 15/15 sentences accurately. Maria Porter demonstrated compensation for spelling using i phone/SIRI to pull up word she needs to spell with rare min A.              SLP Education - 05/24/21 1558     Education Details use of phone to compensate for spelling    Person(s) Educated Patient    Methods Explanation;Verbal cues;Handout;Demonstration    Comprehension Verbalized understanding;Returned demonstration              SLP Short Term Goals - 05/24/21 1601       SLP SHORT TERM GOAL #1   Title In structured language tasks, pt will use verbal and/or non verbal compensations for word finding episodes with occasional min A    Baseline no strategies, just giving up    Time 4    Period Weeks    Status On-going      SLP SHORT TERM GOAL #2   Title Pt will carryover compensations for verbal apraxia to be 90% intellgible in complex conversation over 10 minutes with rare min A    Baseline uses compensations inconsistently resulting in reduced intellgibility  Time 4    Period Weeks    Status On-going      SLP SHORT TERM GOAL #3   Title Pt will use compensatory strategies to complete 3 business/community interactions or phone calls with occasional min A    Baseline sister is handling all business/community interactions for her    Time 4    Period Weeks    Status On-going      SLP SHORT TERM GOAL #4   Title Pt will ID and self correct written errors at sentence level 18/20 sentences with rare min A    Baseline ID's errors, but unable to self correct    Time 4    Period Weeks    Status On-going              SLP Long Term Goals - 05/24/21 1602       SLP LONG TERM GOAL #1   Title Pt will carryover verbal and  non verbal compensations for aphasia as needed over 18 minute moderately complex conversation with rare min A    Baseline no strategies, just giving up    Time 10    Period Weeks    Status On-going      SLP LONG TERM GOAL #2   Title Pt will name 15 items in personally relevant categories with occasional min A over 2 sessions    Baseline 4 items in a simple category    Time 10    Period Days    Status On-going      SLP LONG TERM GOAL #3   Title Pt will carryover compensations for dysarthria to be 100% intelligible over 20 minute conversation over 2 visits    Baseline I needed her to repeat herself 3x during evaluation    Time 10    Period Weeks    Status On-going      SLP LONG TERM GOAL #4   Title Pt will ID and self correct errors in simple 3 -4 sentence written paragraph with occasional min A    Baseline did not ID or correct errors, no compensation for spelling difficulty    Time 10    Period Days    Status On-going      SLP LONG TERM GOAL #5   Title Pt will improve her score on the Communication Participation Item Bank short form by 2 points    Baseline initial score of 9    Time 10    Period Weeks    Status On-going      SLP LONG TERM GOAL #6   Status On-going              Plan - 05/24/21 1559     Clinical Impression Statement Maria Porter continues to present with mild aphasia and dysarthria (? foreign Porter syndrome) and Maria Porter. Targeted written expression at sentence level with rare min A, trained in compensation for spelling errors. Targeted naming with usual min semantic cues. Continue skilled ST to maximize communication for QOL, safety and possible return to nursing if physically symptoms improve.    Speech Therapy Frequency 1x /week    Duration 12 weeks    Treatment/Interventions Language facilitation;Environmental controls;Cueing hierarchy;Oral motor exercises;SLP instruction and feedback;Compensatory strategies;Functional tasks;Cognitive  reorganization;Compensatory techniques;Patient/family education;Multimodal communcation approach;Internal/external aids    Potential to Achieve Goals Good             Patient will benefit from skilled therapeutic intervention in order to improve the following deficits and impairments:  Aphasia    Problem List Patient Active Problem List   Diagnosis Date Noted   Right shoulder pain 05/23/2021   Healthcare maintenance 03/07/2021   History of DVT (deep vein thrombosis) 10/06/2020   Family history of premature coronary heart disease 09/14/2020   OSA (obstructive sleep apnea) 09/14/2020   Smokes less than 1/2 pack a day with greater than 15 pack year history 09/14/2020   Elevated liver function tests 02/05/2020   Pre-diabetes 02/03/2020   Subclinical hypothyroidism 02/03/2020   Tobacco use disorder 01/27/2020   Hyperlipemia 01/27/2020   Obesity 01/27/2020   History of CVA with residual deficit 01/26/2020   Extrinsic asthma 10/09/2004    Paytan Recine, Radene JourneyLaura Ann, CCC-SLP 05/24/2021, 4:03 PM  [facility] [address] [place], [place], [address] Phone: [phone]   Fax:  [phone]   Name: Novella OliveDanielle Kasinger MRN: [id number] Date of Birth: 1976-02-24

## 2021-05-24 NOTE — Patient Instructions (Signed)
° ° °  Take the bus to the [employer] or [employer] gallery  Tell Dr. Val Eagle that you have [employer] job using [employer] to check your spelling

## 2021-05-26 NOTE — Progress Notes (Signed)
Internal Medicine Clinic Attending ° °Case discussed with Dr. Rehman  At the time of the visit.  We reviewed the resident’s history and exam and pertinent patient test results.  I agree with the assessment, diagnosis, and plan of care documented in the resident’s note.  ° °

## 2021-05-31 ENCOUNTER — Ambulatory Visit: Payer: Medicaid Other

## 2021-05-31 ENCOUNTER — Ambulatory Visit: Payer: Medicaid Other | Admitting: Occupational Therapy

## 2021-05-31 ENCOUNTER — Ambulatory Visit: Payer: Medicaid Other | Admitting: Physical Therapy

## 2021-05-31 ENCOUNTER — Other Ambulatory Visit: Payer: Self-pay | Admitting: Internal Medicine

## 2021-05-31 DIAGNOSIS — J45909 Unspecified asthma, uncomplicated: Secondary | ICD-10-CM

## 2021-05-31 DIAGNOSIS — F17200 Nicotine dependence, unspecified, uncomplicated: Secondary | ICD-10-CM

## 2021-06-05 ENCOUNTER — Other Ambulatory Visit: Payer: Self-pay | Admitting: Internal Medicine

## 2021-06-05 DIAGNOSIS — J45909 Unspecified asthma, uncomplicated: Secondary | ICD-10-CM

## 2021-06-05 MED ORDER — ALBUTEROL SULFATE HFA 108 (90 BASE) MCG/ACT IN AERS: 1.0000 | INHALATION_SPRAY | Freq: Four times a day (QID) | RESPIRATORY_TRACT | 3 refills | Status: DC | PRN

## 2021-06-07 ENCOUNTER — Other Ambulatory Visit: Payer: Self-pay

## 2021-06-07 ENCOUNTER — Ambulatory Visit: Payer: Medicaid Other | Admitting: Physical Therapy

## 2021-06-07 ENCOUNTER — Ambulatory Visit: Payer: Medicaid Other

## 2021-06-07 ENCOUNTER — Ambulatory Visit: Payer: Medicaid Other | Attending: Neurology | Admitting: Occupational Therapy

## 2021-06-07 DIAGNOSIS — M6281 Muscle weakness (generalized): Secondary | ICD-10-CM | POA: Diagnosis present

## 2021-06-07 DIAGNOSIS — R2681 Unsteadiness on feet: Secondary | ICD-10-CM | POA: Diagnosis present

## 2021-06-07 DIAGNOSIS — I69353 Hemiplegia and hemiparesis following cerebral infarction affecting right non-dominant side: Secondary | ICD-10-CM | POA: Diagnosis not present

## 2021-06-07 DIAGNOSIS — M25611 Stiffness of right shoulder, not elsewhere classified: Secondary | ICD-10-CM | POA: Diagnosis present

## 2021-06-07 DIAGNOSIS — R278 Other lack of coordination: Secondary | ICD-10-CM | POA: Diagnosis present

## 2021-06-07 DIAGNOSIS — R482 Apraxia: Secondary | ICD-10-CM

## 2021-06-07 DIAGNOSIS — R4701 Aphasia: Secondary | ICD-10-CM

## 2021-06-07 DIAGNOSIS — M25511 Pain in right shoulder: Secondary | ICD-10-CM | POA: Insufficient documentation

## 2021-06-07 DIAGNOSIS — G8929 Other chronic pain: Secondary | ICD-10-CM | POA: Diagnosis present

## 2021-06-07 NOTE — Therapy (Signed)
[facility] [address] [place], [place], [address] Phone: [phone]   Fax:  [phone]  Speech Language Pathology Treatment  Patient Details  Name: Maria Porter MRN: [id number] Date of Birth: [birth year] Referring Provider (SLP): Dr. Tyson Alias   Encounter Date: 06/07/2021   End of Session - 06/07/21 1102     Visit Number 3    Number of Visits 10    Date for SLP Re-Evaluation 07/10/21    Authorization Type medicaid - is receiving PT and OT as well. As it is January, I will request 6 visits    Authorization - Visit Number 2    Authorization - Number of Visits 6    SLP Start Time 1104    SLP Stop Time  1145    SLP Time Calculation (min) 41 min    Activity Tolerance Patient tolerated treatment well             Past Medical History:  Diagnosis Date   Acute deep vein thrombosis of right iliac vein (HCC) 09/14/2020   Stroke [facility])     Past Surgical History:  Procedure Laterality Date   BUBBLE STUDY  01/29/2020   Procedure: BUBBLE STUDY;  Surgeon: Little Ishikawa, MD;  Location: [facility] ENDOSCOPY;  Service: Cardiovascular;;   TEE WITHOUT CARDIOVERSION N/A 01/29/2020   Procedure: TRANSESOPHAGEAL ECHOCARDIOGRAM (TEE);  Surgeon: Little Ishikawa, MD;  Location: [facility] ENDOSCOPY;  Service: Cardiovascular;  Laterality: N/A;    There were no vitals filed for this visit.   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 No/denies                   ADULT SLP TREATMENT - 06/07/21 1102       General Information   Behavior/Cognition Alert;Cooperative;Pleasant mood      Treatment Provided   Treatment provided Cognitive-Linquistic      Cognitive-Linquistic Treatment   Treatment focused on Dysarthria;Patient/family/caregiver education;Aphasia    Skilled Treatment Pt completed half of HEP to generate sentences with targeted  words x2. While reading aloud, pt able to independently identify when sentence was not grammatically correct but required intermittent min to mod A to correct sentence. Pt benefitted from rare cues to aid spelling as well as occasional cue to slow rate to optimize articulatory precision and listener comprehension in conversation. SLP recommended reading aloud to practice word finding and fluency of speech.      Assessment / Recommendations / Plan   Plan Continue with current plan of care      Progression Toward Goals   Progression toward goals Progressing toward goals              SLP Education - 06/07/21 1245     Education Details read aloud, slow down    Person(s) Educated Patient    Methods Explanation;Demonstration;Handout;Verbal cues    Comprehension Verbalized understanding;Returned demonstration;Need further instruction              SLP Short Term Goals - 06/07/21 1103       SLP SHORT TERM GOAL #1   Title In structured language tasks, pt will use verbal and/or non verbal compensations for word finding episodes with occasional min A    Baseline no strategies, just giving up; 06-07-21    Time 3    Period Weeks    Status On-going      SLP SHORT TERM  GOAL #2   Title Pt will carryover compensations for verbal apraxia to be 90% intellgible in complex conversation over 10 minutes with rare min A    Baseline uses compensations inconsistently resulting in reduced intellgibility    Time 3    Period Weeks    Status On-going      SLP SHORT TERM GOAL #3   Title Pt will use compensatory strategies to complete 3 business/community interactions or phone calls with occasional min A    Baseline sister is handling all business/community interactions for her    Time 3    Period Weeks    Status On-going      SLP SHORT TERM GOAL #4   Title Pt will ID and self correct written errors at sentence level 18/20 sentences with rare min A    Baseline ID's errors, but unable to self  correct; 06-07-21    Time 3    Period Weeks    Status On-going              SLP Long Term Goals - 06/07/21 1104       SLP LONG TERM GOAL #1   Title Pt will carryover verbal and non verbal compensations for aphasia as needed over 18 minute moderately complex conversation with rare min A    Baseline no strategies, just giving up    Time 9    Period Weeks    Status On-going      SLP LONG TERM GOAL #2   Title Pt will name 15 items in personally relevant categories with occasional min A over 2 sessions    Baseline 4 items in a simple category    Time 9    Period Days    Status On-going      SLP LONG TERM GOAL #3   Title Pt will carryover compensations for dysarthria to be 100% intelligible over 20 minute conversation over 2 visits    Baseline I needed her to repeat herself 3x during evaluation    Time 9    Period Weeks    Status On-going      SLP LONG TERM GOAL #4   Title Pt will ID and self correct errors in simple 3 -4 sentence written paragraph with occasional min A    Baseline did not ID or correct errors, no compensation for spelling difficulty    Time 9    Period Days    Status On-going      SLP LONG TERM GOAL #5   Title Pt will improve her score on the Communication Participation Item Bank short form by 2 points    Baseline initial score of 9    Time 9    Period Weeks    Status On-going      SLP LONG TERM GOAL #6   Status --              Plan - 06/07/21 1103     Clinical Impression Statement Duwayne HeckDanielle continues to present with mild aphasia and dysarthria (? foreign accent syndrome) and [place] accent. Targeted written expression at sentence level with rare min A for spelling and grammer. Conversational speech benefited from occasional cues to slow rate to optimize articulatory precision and listener comprehension. Continue skilled ST to maximize communication for QOL, safety and possible return to nursing if physically symptoms improve.    Speech Therapy  Frequency 1x /week    Duration 12 weeks    Treatment/Interventions Language facilitation;Environmental controls;Cueing hierarchy;Oral motor exercises;SLP instruction  and feedback;Compensatory strategies;Functional tasks;Cognitive reorganization;Compensatory techniques;Patient/family education;Multimodal communcation approach;Internal/external aids    Potential to Achieve Goals Good             Patient will benefit from skilled therapeutic intervention in order to improve the following deficits and impairments:   Aphasia  Verbal apraxia    Problem List Patient Active Problem List   Diagnosis Date Noted   Right shoulder pain 05/23/2021   Healthcare maintenance 03/07/2021   History of DVT (deep vein thrombosis) 10/06/2020   Family history of premature coronary heart disease 09/14/2020   OSA (obstructive sleep apnea) 09/14/2020   Smokes less than 1/2 pack a day with greater than 15 pack year history 09/14/2020   Elevated liver function tests 02/05/2020   Pre-diabetes 02/03/2020   Subclinical hypothyroidism 02/03/2020   Tobacco use disorder 01/27/2020   Hyperlipemia 01/27/2020   Obesity 01/27/2020   History of CVA with residual deficit 01/26/2020   Extrinsic asthma 10/09/2004    Janann Colonel, CCC-SLP 06/07/2021, 12:49 PM  [facility] [address] [place], [place], [address] Phone: [phone]   Fax:  [phone]   Name: Deloria Brassfield MRN: [id number] Date of Birth: 1976-01-02

## 2021-06-07 NOTE — Therapy (Signed)
[facility] [address] [place], [place], [address] Phone: [phone]   Fax:  [phone]  Occupational Therapy Treatment  Patient Details  Name: Maria Porter MRN: [id number] Date of Birth: Apr 24, 1976 Referring Provider (OT): Dr. Oswaldo Done   Encounter Date: 06/07/2021   OT End of Session - 06/07/21 1114     Visit Number 12    Number of Visits 25    Date for OT Re-Evaluation 06/21/21    Authorization Type [employer] Medicaid    OT Start Time 1015    OT Stop Time 1100    OT Time Calculation (min) 45 min    Activity Tolerance Patient tolerated treatment well    Behavior During Therapy [facility] for tasks assessed/performed             Past Medical History:  Diagnosis Date   Acute deep vein thrombosis of right iliac vein (HCC) 09/14/2020   Stroke [facility])     Past Surgical History:  Procedure Laterality Date   BUBBLE STUDY  01/29/2020   Procedure: BUBBLE STUDY;  Surgeon: Little Ishikawa, MD;  Location: [facility] ENDOSCOPY;  Service: Cardiovascular;;   TEE WITHOUT CARDIOVERSION N/A 01/29/2020   Procedure: TRANSESOPHAGEAL ECHOCARDIOGRAM (TEE);  Surgeon: Little Ishikawa, MD;  Location: [facility] ENDOSCOPY;  Service: Cardiovascular;  Laterality: N/A;    There were no vitals filed for this visit.   Subjective Assessment - 06/07/21 1015     Subjective  I finished my last naproxen yesterday. The xray showed spurring (at the acromion)    Pertinent History CVA      PMH:  hx of prior CVA (approx 4 yrs ago affecting L side, 01/2020 affecting R side, and 09/11/20 affecting R side); obesity, hyperlipidemia, sleep apnea    Patient Stated Goals get back to using my Rt arm, improve ability to do crafts    Currently in Pain? No/denies   pain w/ certain mvoements             Prone: scapula retraction/depression x 10 w/ min tactile cues. Prone on elbows w/ trunk lift for scapula depression followed by disengaging LUE to increase weight  over Rt sh girdle.  Standing modified quadraped w/ A/P wt shifts.  Seated: AA/ROM in low to mid range shoulder flex BUE's followed by low range AA/ROM in shoulder abduction (w/ arm supported on ball, then sliding along xfer board)  UBE x 6 min, level 1 for reciprocal movement pattern                      OT Short Term Goals - 05/24/21 0959       OT SHORT TERM GOAL #1   Title Pt will be independent with initial HEP.    Baseline no HEP    Time 4    Period Weeks    Status Achieved      OT SHORT TERM GOAL #2   Title Pt will demo at least 85* R shoulder flex for functional reach.    Baseline 60-70*    Time 4    Period Weeks    Status Achieved   04/27/21:  85* with 1-2/10 pain     OT SHORT TERM GOAL #3   Title Pt will improve R hand coordination for ADLs/IADLs as shown by improving score on box and blocks test by at least 10 blocks.    Baseline 27 blocks    Time 4    Period Weeks    Status Achieved  04/27/21:  33 blocks, 05/24/21: 46 blocks     OT SHORT TERM GOAL #4   Title Pt will improve R hand grip strength to at least 15 lbs to assist in opening bottles/containers.    Baseline 6.1 lbs    Time 4    Period Weeks    Status On-going   04/27/21:  9.9lbs, 05/24/21: 12 lbs              OT Long Term Goals - 05/24/21 1000       OT LONG TERM GOAL #1   Title Pt will verbalize understanding of AE/strategies for incr safety/independence with ADLs/IADLs  (including cognitive and visual strategies prn, and for opening jars/medicine bottles)    Baseline dependent, needs education    Time 12    Period Weeks    Status New      OT LONG TERM GOAL #2   Title Pt will demo at least 90* R shoulder flex for functional reach.    Baseline 60-70*    Time 12    Period Weeks    Status On-going   80* - 85*     OT LONG TERM GOAL #3   Title Pt will improve R hand coordination for ADLs/IADLs and leisure tasks as shown by completing 9-hole peg test in 30 sec or less.     Baseline 35.94 sec    Time 12    Period Weeks    Status Achieved   27 sec     OT LONG TERM GOAL #4   Title Pt will improve R hand grip strength to at least 20 lbs to assist in opening bottles/containers.    Baseline 6.1 lbs    Time 12    Period Weeks    Status On-going      OT LONG TERM GOAL #5   Title Pt will tolerate shoulder P/ROM to 110* w/ pain less than or equal to 3/10    Baseline only to 85/90*    Time 12    Period Weeks    Status On-going                   Plan - 06/07/21 1114     Clinical Impression Statement Pt's xray of Rt shoulder showed spurring on inferior aspect of acromion. Pt overall has done better with Rt shoulder in low to mid ranges, however mid to high ranges remain limited.    OT Occupational Profile and History Detailed Assessment- Review of Records and additional review of physical, cognitive, psychosocial history related to current functional performance    Occupational performance deficits (Please refer to evaluation for details): ADL's;IADL's;Leisure    Body Structure / Function / Physical Skills ADL;Decreased knowledge of use of DME;Strength;Dexterity;Balance;UE functional use;Proprioception;ROM;IADL;Vision;Sensation;Mobility;Coordination;FMC    Cognitive Skills Attention;Memory;Problem Solve;Sequencing;Safety Awareness    Rehab Potential Good    Clinical Decision Making Several treatment options, min-mod task modification necessary    Comorbidities Affecting Occupational Performance: May have comorbidities impacting occupational performance    Modification or Assistance to Complete Evaluation  Min-Moderate modification of tasks or assist with assess necessary to complete eval    OT Frequency 2x / week    OT Duration 12 weeks   PLUS EVAL (anticipate only 8 weeks)   OT Treatment/Interventions Self-care/ADL training;Moist Heat;Fluidtherapy;Splinting;Balance training;Therapeutic activities;Therapeutic exercise;Ultrasound;Cognitive  remediation/compensation;Visual/perceptual remediation/compensation;Passive range of motion;Functional Mobility Training;Neuromuscular education;Cryotherapy;Energy conservation;Manual Therapy;Patient/family education;Paraffin;DME and/or AE instruction;Coping strategies training    Plan check goals and d/c next session (offered an additional week, however  pt reports she is going to family[place] in [place])    Recommended Other Services Speech therapy - to schedule beginning in January when visits start over    Consulted and Agree with Plan of Care Patient             Patient will benefit from skilled therapeutic intervention in order to improve the following deficits and impairments:   Body Structure / Function / Physical Skills: ADL, Decreased knowledge of use of DME, Strength, Dexterity, Balance, UE functional use, Proprioception, ROM, IADL, Vision, Sensation, Mobility, Coordination, [facility] Cognitive Skills: Attention, Memory, Problem Solve, Sequencing, Safety Awareness     Visit Diagnosis: Hemiplegia and hemiparesis following cerebral infarction affecting right non-dominant side (HCC)  Chronic right shoulder pain  Stiffness of right shoulder, not elsewhere classified    Problem List Patient Active Problem List   Diagnosis Date Noted   Right shoulder pain 05/23/2021   Healthcare maintenance 03/07/2021   History of DVT (deep vein thrombosis) 10/06/2020   Family history of premature coronary heart disease 09/14/2020   OSA (obstructive sleep apnea) 09/14/2020   Smokes less than 1/2 pack a day with greater than 15 pack year history 09/14/2020   Elevated liver function tests 02/05/2020   Pre-diabetes 02/03/2020   Subclinical hypothyroidism 02/03/2020   Tobacco use disorder 01/27/2020   Hyperlipemia 01/27/2020   Obesity 01/27/2020   History of CVA with residual deficit 01/26/2020   Extrinsic asthma 10/09/2004    Kelli ChurnBallie, Laterra Lubinski Johnson, OTR/L 06/07/2021, 11:17  AM  [facility] [address] [place], [place], [address] Phone: [phone]   Fax:  [phone]  Name: Maria Porter MRN: [id number] Date of Birth: 10/14/1975

## 2021-06-07 NOTE — Patient Instructions (Signed)
[facility] [address] [place], [place], [place] [address]  Practice reading aloud - whatever you want to read will work just fine!  Practice going SLOW - this will take lots of practice

## 2021-06-08 ENCOUNTER — Ambulatory Visit: Payer: Medicaid Other | Admitting: Behavioral Health

## 2021-06-08 DIAGNOSIS — F419 Anxiety disorder, unspecified: Secondary | ICD-10-CM

## 2021-06-08 DIAGNOSIS — F4329 Adjustment disorder with other symptoms: Secondary | ICD-10-CM

## 2021-06-08 DIAGNOSIS — F331 Major depressive disorder, recurrent, moderate: Secondary | ICD-10-CM

## 2021-06-08 DIAGNOSIS — F4381 Prolonged grief disorder: Secondary | ICD-10-CM

## 2021-06-08 NOTE — BH Specialist Note (Signed)
Integrated Behavioral Health via Telemedicine Visit  06/08/2021 Maria Porter [id number]  Number of Integrated Behavioral Health visits: 3/6 Session Start time: 11:15am  Session End time: 11:45am Total time: 30  Referring Provider: Dr. Charlotta Newton, DO Patient/Family location: Pt is home in private [facility] Provider location: Working remotely All persons participating in visit: Pt & Clinician Types of Service: Individual psychotherapy  I connected with Novella Olive and/or Maria Porter  self  via  Telephone or Video Enabled Telemedicine Application  (Video is Caregility application) and verified that I am speaking with the correct person using two identifiers. Discussed confidentiality:  3rd visit  I discussed the limitations of telemedicine and the availability of in person appointments.  Discussed there is a possibility of technology failure and discussed alternative modes of communication if that failure occurs.  I discussed that engaging in this telemedicine visit, they consent to the provision of behavioral healthcare and the services will be billed under their insurance.  Patient and/or legal guardian expressed understanding and consented to Telemedicine visit:  3rd visit  Presenting Concerns: Patient and/or family reports the following symptoms/concerns: elevated anx/dep due to 47yo Son's pending release from incarceration for 10 yrs Duration of problem: years; Severity of problem: moderate  Patient and/or Family's Strengths/Protective Factors: Social and Emotional competence, Concrete supports in place (healthy food, safe environments, etc.), Sense of purpose, and Physical Health (exercise, healthy diet, medication compliance, etc.)  Goals Addressed: Patient will:  Reduce symptoms of: anxiety, depression, and stress   Increase knowledge and/or ability of: coping skills and stress reduction   Demonstrate ability to: Increase healthy adjustment to current life  circumstances  Progress towards Goals: Ongoing  Interventions: Interventions utilized:  Supportive Counseling Standardized Assessments completed:  screeners prn  Patient and/or Family Response: Pt is receptive to session today & requests future appt  Assessment: Patient currently experiencing back pain & heartache over the situation w/Son who is 47yo being released from [place] in [place]. This Son Maria Porter has been in & out of prison since 47yo. Pt wants his Wife to assist him most when he is released so he can transition to his Family of 3 children.   Pt has dealt w/a Hx of tragic events invl'g her children & extended Family. Pt has been unable to grief appropriately due to the mandate to work to support her Family.  Patient may benefit from cont'd psychotherapy support to encourage f/u on goals & goal revision prn. Pt needs resources for Families of incarcerated Youth.  Plan: Follow up with behavioral health clinician on : 2-3 wks for 60 min on telehealth Behavioral recommendations: Try to process your grief by writing your thoughts, feelings, & tears in a Notebook. Pt has lost her Mother, her Sonda Primes, & her Cousin. Referral(s): Integrated [employer] (In Clinic)  I discussed the assessment and treatment plan with the patient and/or parent/guardian. They were provided an opportunity to ask questions and all were answered. They agreed with the plan and demonstrated an understanding of the instructions.   They were advised to call back or seek an in-person evaluation if the symptoms worsen or if the condition fails to improve as anticipated.  Maria Lever, LMFT

## 2021-06-09 ENCOUNTER — Other Ambulatory Visit: Payer: Self-pay

## 2021-06-09 ENCOUNTER — Emergency Department (HOSPITAL_COMMUNITY)
Admission: EM | Admit: 2021-06-09 | Discharge: 2021-06-09 | Disposition: A | Discharge status: Home / Self Care | Payer: Medicaid Other | Attending: Student | Admitting: Student

## 2021-06-09 ENCOUNTER — Emergency Department (HOSPITAL_COMMUNITY): Payer: Medicaid Other

## 2021-06-09 ENCOUNTER — Encounter (HOSPITAL_COMMUNITY): Payer: Self-pay | Admitting: Emergency Medicine

## 2021-06-09 DIAGNOSIS — M545 Low back pain, unspecified: Secondary | ICD-10-CM | POA: Diagnosis not present

## 2021-06-09 DIAGNOSIS — Z79899 Other long term (current) drug therapy: Secondary | ICD-10-CM | POA: Diagnosis not present

## 2021-06-09 DIAGNOSIS — Z7901 Long term (current) use of anticoagulants: Secondary | ICD-10-CM | POA: Insufficient documentation

## 2021-06-09 DIAGNOSIS — M546 Pain in thoracic spine: Secondary | ICD-10-CM | POA: Diagnosis present

## 2021-06-09 DIAGNOSIS — M549 Dorsalgia, unspecified: Secondary | ICD-10-CM

## 2021-06-09 MED ORDER — ACETAMINOPHEN 325 MG PO TABS
650.0000 mg | ORAL_TABLET | Freq: Once | ORAL | Status: AC
  Administered 2021-06-09: 650 mg via ORAL
  Filled 2021-06-09: qty 2

## 2021-06-09 MED ORDER — METHOCARBAMOL 500 MG PO TABS: 500.0000 mg | ORAL_TABLET | Freq: Every evening | ORAL | 0 refills | Status: DC

## 2021-06-09 MED ORDER — LIDOCAINE 5 % EX PTCH
1.0000 | MEDICATED_PATCH | Freq: Once | CUTANEOUS | Status: DC
  Administered 2021-06-09: 1 via TRANSDERMAL
  Filled 2021-06-09: qty 1

## 2021-06-09 MED ORDER — METHOCARBAMOL 500 MG PO TABS
500.0000 mg | ORAL_TABLET | Freq: Once | ORAL | Status: AC
  Administered 2021-06-09: 500 mg via ORAL
  Filled 2021-06-09: qty 1

## 2021-06-09 NOTE — ED Triage Notes (Signed)
Pt arrives POV with complaints of back pain, lower lumbar area to the right. Pt states she was getting dressed after showering two days ago, she felt it pop. The states it feels like she pulled something.  Rating pain 6 out of 10. NAD at this time.

## 2021-06-09 NOTE — Discharge Instructions (Addendum)
A muscle relaxant has been sent to your pharmacy by the name of Robaxin.  Take one 500 mg tablet nightly as needed.  Stop using immediately you develop rash or difficulty breathing.  You can continue using Tylenol for additional pain management.  Do not exceed 4,000 mg of acetaminophen during a 24 hour period  You may also utilize over-the-counter Lidoderm patches for additional pain management.  Use as indicated on packaged.  I have provided you with the information for the orthopedic office of Dr. Sammuel Hines.  Please call and schedule an appointment in the next 1 to 2 weeks for further evaluation of your back pain.  Return to the ED for new or worsening symptoms as discussed.

## 2021-06-09 NOTE — ED Notes (Signed)
Pt transported to Xray. 

## 2021-06-09 NOTE — ED Provider Notes (Signed)
[facility] EMERGENCY DEPARTMENT Provider Note   CSN: [id number] Arrival date & time: 06/09/21  1007     History  Chief Complaint  Patient presents with   Back Pain    Maria Porter is a 46 y.o. female with a history of stroke, DVT, HLD, OSA presenting today with new onset of back pain starting 2 days ago.  Patient bent over to pick something up in the morning and felt pain in the middle of her back.  Later that day she was exercising at physical therapy when she felt a pop in that same location.  Patient denied sudden onset of intense pain, stating it came later day.  Pain described as worse with movement, constant, dull, and without radiation.  Heat provided temporary relief and Tylenol provided no relief.  Complains of restricted range of motion due to the pain.  Denies bladder or bowel incontinence.  Denies numbness or tingling of the lower extremities.  Denies shortness of breath, fever, chest pain, leg weakness, recent trauma.  Uses tobacco daily.  The history is provided by the patient and medical records.      Home Medications Prior to Admission medications   Medication Sig Start Date End Date Taking? Authorizing Provider  methocarbamol (ROBAXIN) 500 MG tablet Take 1 tablet (500 mg total) by mouth at bedtime. Do not operate vehicles or heavy machinery within 12 hours of this ingestion. 06/09/21  Yes Prince Rome, PA-C  albuterol (PROVENTIL) (2.5 MG/3ML) 0.083% nebulizer solution Take 3 mLs (2.5 mg total) by nebulization every 4 (four) hours as needed for wheezing or shortness of breath. 06/05/21 06/05/22  Rehman, Areeg N, DO  albuterol (VENTOLIN HFA) 108 (90 Base) MCG/ACT inhaler Inhale 1-2 puffs into the lungs every 6 (six) hours as needed for wheezing or shortness of breath. 06/05/21   Rehman, Areeg N, DO  atorvastatin (LIPITOR) 40 MG tablet TAKE 1 TABLET BY MOUTH EVERY DAY 04/03/21   Katsadouros, Vasilios, MD  ELIQUIS 5 MG TABS tablet TAKE 1 TABLET BY  MOUTH TWICE A DAY 04/06/21   Katsadouros, Vasilios, MD  levothyroxine (SYNTHROID) 150 MCG tablet TAKE 1 TABLET BY MOUTH EVERY DAY 03/20/21   Katsadouros, Vasilios, MD  nicotine (NICODERM CQ - DOSED IN MG/24 HOURS) 21 mg/24hr patch Place 1 patch (21 mg total) onto the skin daily. 03/07/21 03/07/22  Riesa Pope, MD      Allergies    Patient has no known allergies.    Review of Systems   Review of Systems  Constitutional:  Negative for chills and fever.  HENT:  Negative for congestion, ear pain, rhinorrhea and sore throat.   Eyes:  Negative for pain and visual disturbance.  Respiratory:  Negative for cough and shortness of breath.   Cardiovascular:  Negative for chest pain and palpitations.  Gastrointestinal:  Negative for abdominal pain, constipation, diarrhea, nausea and vomiting.       Negative for bowel dysfunction or incontinence  Genitourinary:  Negative for dysuria and hematuria.       Negative for urinary incontinence  Musculoskeletal:  Positive for back pain. Negative for arthralgias, neck pain and neck stiffness.       Slight right-sided weakness due to previous stroke - at baseline  Skin:  Negative for color change and rash.  Neurological:  Negative for seizures, syncope, numbness and headaches.  All other systems reviewed and are negative.  Physical Exam Updated Vital Signs BP (!) 121/91 (BP Location: Left Arm)    Pulse 91  Temp 97.7 F (36.5 C) (Oral)    Resp 14    Ht 5\' 6"  (1.676 m)    Wt 90.7 kg    SpO2 98%    BMI 32.28 kg/m  Physical Exam Vitals and nursing note reviewed.  Constitutional:      General: She is not in acute distress.    Appearance: Normal appearance. She is well-developed. She is not ill-appearing.  HENT:     Head: Normocephalic and atraumatic.  Eyes:     Conjunctiva/sclera: Conjunctivae normal.  Cardiovascular:     Rate and Rhythm: Normal rate and regular rhythm.     Pulses: Normal pulses.     Heart sounds: No murmur heard. Pulmonary:      Effort: Pulmonary effort is normal. No respiratory distress.     Breath sounds: Normal breath sounds.  Abdominal:     General: Bowel sounds are normal.     Palpations: Abdomen is soft.     Tenderness: There is no abdominal tenderness.  Musculoskeletal:        General: No swelling.       Arms:     Cervical back: Neck supple. No tenderness.     Comments: TTP as indicated above  Skin:    General: Skin is warm and dry.     Capillary Refill: Capillary refill takes less than 2 seconds.     Findings: No bruising.  Neurological:     Mental Status: She is alert and oriented to person, place, and time.     GCS: GCS eye subscore is 4. GCS verbal subscore is 5. GCS motor subscore is 6.     Deep Tendon Reflexes:     Reflex Scores:      Bicep reflexes are 1+ on the right side and 1+ on the left side.      Patellar reflexes are 1+ on the right side and 1+ on the left side.    Comments: Sensation intact of lower extremities bilateral  Psychiatric:        Mood and Affect: Mood normal.    ED Results / Procedures / Treatments   Labs (all labs ordered are listed, but only abnormal results are displayed) Labs Reviewed - No data to display  EKG None  Radiology DG Thoracic Spine 2 View  Result Date: 06/09/2021 CLINICAL DATA:  Back pain EXAM: THORACIC SPINE 2 VIEWS COMPARISON:  None. FINDINGS: There is no evidence of thoracic spine fracture. Alignment is normal. Minimal multilevel degenerative changes. IMPRESSION: No evidence of thoracic spine fracture. Minimal multilevel degenerative changes. Electronically Signed   By: Caprice Renshaw M.D.   On: 06/09/2021 11:35   DG Lumbar Spine Complete  Result Date: 06/09/2021 CLINICAL DATA:  Back pain EXAM: LUMBAR SPINE - COMPLETE 4+ VIEW COMPARISON:  None. FINDINGS: There are 5 non-rib-bearing lumbar vertebrae. There is no evidence of lumbar spine fracture. Relatively preserved disc heights. Mild lower lumbar predominant facet arthropathy. There are surgical  clips overlying the pelvis. There is mild right hip osteoarthritis. IMPRESSION: No evidence of lumbar spine fracture. Mild lower lumbar predominant facet arthropathy. Relatively preserved disc heights. Electronically Signed   By: Caprice Renshaw M.D.   On: 06/09/2021 11:37    Procedures Procedures    Medications Ordered in ED Medications  lidocaine (LIDODERM) 5 % 1 patch (1 patch Transdermal Patch Applied 06/09/21 1134)  acetaminophen (TYLENOL) tablet 650 mg (650 mg Oral Given 06/09/21 1134)  methocarbamol (ROBAXIN) tablet 500 mg (500 mg Oral Given 06/09/21 1134)  ED Course/ Medical Decision Making/ A&P                           Medical Decision Making Amount and/or Complexity of Data Reviewed Radiology: ordered.  Risk OTC drugs. Prescription drug management.   46 year old female presents to the ED for concern of new onset back pain.  This involves an extensive number of treatment options, and is a complaint that carries with it a high risk of complications and morbidity.  The differential diagnosis includes spinal or rib fracture, herniated nucleus pulposus,  Comorbidities that complicate the patient evaluation include history of DVT, HLD, history of stroke, obesity, chronic tobacco use, anticoagulation therapy  Additional history obtained from sister and internal/external records available via epic  Interpretation: I ordered imaging studies including x-ray of the thoracic and lumbar spine.  I independently visualized and interpreted imaging which showed no evidence of acute thoracic or lumbar spine fracture.  I agree with the radiologist interpretation.  Physical exam and patient history support this as well.  I considered ordering labs, but the patient was afebrile and there's low suspicion for an epidural abscess.    I considered ordering a CT of the thoracic and lumbar spine, but the patient has not had recent infection, is afebrile, has a recent mechanical injury, has recent history  of IV drug use, and is not severely TTP on exam.  I do not believe an epidural abscess is likely  I considered ordering an MRI of the spine to assess for cauda equina syndrome, but the patient's presentation and history do not support this as a likely cause for her back pain.  Patient's neuro exam was unremarkable and that her sensation was intact in both limbs bilaterally, without saddle anesthesia, and without urinary or fecal incontinence.  Intervention: I ordered medication including Bentyl, lidocaine patch, and Robaxin for pain management.  Reevaluation of the patient after these medicines showed that the patient had a moderate improvement.  Patient was smiling and moving around better.  I have reviewed the patients home medicines and have made adjustments as needed  ED Course: After the interventions noted above, I have low suspicion for acute fracture pathology as the cause for the patient's presentation.  Patient's observable neurodeficits or obvious obvious mass or malalignment on physical exam or imaging, without obvious signs of infection, and without significant tenderness to palpation.  Because of the patient's etiology is very likely benign in nature, such as muscle spasm or acute muscle strain.    Dispostion: I discussed the patient and their case with my attending, Dr. Matilde Sprang and Benedetto Goad PA-C, who agreed with the proposed treatment course.  After consideration of the diagnostic results and the patient's response to treatment, I feel that the patent would benefit from discharge with outpatient pain management and outpatient follow-up with orthopedics.  Discussed course of treatment thoroughly with the patient and her sister, whom demonstrated understanding.  Patient in agreement and has no further questions.        Final Clinical Impression(s) / ED Diagnoses Final diagnoses:  Mid-back pain, acute    Rx / DC Orders ED Discharge Orders          Ordered    methocarbamol  (ROBAXIN) 500 MG tablet  Nightly        06/09/21 1302              Candace Cruise [id number]    Kommor, Debe Coder, MD  06/09/21 1621 ° °

## 2021-06-12 ENCOUNTER — Telehealth: Payer: Self-pay

## 2021-06-12 NOTE — Telephone Encounter (Signed)
Transition Care Management Unsuccessful Follow-up Telephone Call  Date of discharge and from where:  06/09/2021 from [facility]  Attempts:  1st Attempt  Reason for unsuccessful TCM follow-up call:  Missing or invalid number

## 2021-06-13 NOTE — Telephone Encounter (Signed)
Transition Care Management Unsuccessful Follow-up Telephone Call  Date of discharge and from where:  06/09/2021 from [facility]  Attempts:  2nd Attempt  Reason for unsuccessful TCM follow-up call:  Left voice message  Call made to sister Maxine Glenn Mcbride) to try to get in touch with patient.  @ [phone]

## 2021-06-14 ENCOUNTER — Ambulatory Visit: Payer: Medicaid Other | Admitting: Physical Therapy

## 2021-06-14 ENCOUNTER — Ambulatory Visit: Payer: Medicaid Other | Admitting: Occupational Therapy

## 2021-06-14 ENCOUNTER — Other Ambulatory Visit: Payer: Self-pay

## 2021-06-14 ENCOUNTER — Ambulatory Visit: Payer: Medicaid Other

## 2021-06-14 DIAGNOSIS — G8929 Other chronic pain: Secondary | ICD-10-CM

## 2021-06-14 DIAGNOSIS — R2681 Unsteadiness on feet: Secondary | ICD-10-CM

## 2021-06-14 DIAGNOSIS — R4701 Aphasia: Secondary | ICD-10-CM

## 2021-06-14 DIAGNOSIS — I69353 Hemiplegia and hemiparesis following cerebral infarction affecting right non-dominant side: Secondary | ICD-10-CM | POA: Diagnosis not present

## 2021-06-14 DIAGNOSIS — R482 Apraxia: Secondary | ICD-10-CM

## 2021-06-14 DIAGNOSIS — R278 Other lack of coordination: Secondary | ICD-10-CM

## 2021-06-14 DIAGNOSIS — M6281 Muscle weakness (generalized): Secondary | ICD-10-CM

## 2021-06-14 DIAGNOSIS — M25611 Stiffness of right shoulder, not elsewhere classified: Secondary | ICD-10-CM

## 2021-06-14 NOTE — Therapy (Signed)
[facility] [address] [place], [place], [address] Phone: [phone]   Fax:  [phone]  Physical Therapy Treatment/Discharge Summary  Patient Details  Name: Maria Porter MRN: [id number] Date of Birth: 1975/10/18 Referring Provider (PT): Axel Filler, MD   Encounter Date: 06/14/2021   PT End of Session - 06/14/21 1017     Visit Number 10    Number of Visits 15    Date for PT Re-Evaluation 07/05/21    Authorization Type [employer] Medicaid - 27 visit limit for 2023    Authorization Time Period Auth not required    Authorization - Visit Number 3    Authorization - Number of Visits 8    Progress Note Due on Visit 10    PT Start Time 1014    PT Stop Time 1050   full time not used due to D/C visit   PT Time Calculation (min) 36 min    Equipment Utilized During Treatment Gait belt    Activity Tolerance Patient tolerated treatment well    Behavior During Therapy [facility] for tasks assessed/performed             Past Medical History:  Diagnosis Date   Acute deep vein thrombosis of right iliac vein ([place]) 09/14/2020   Stroke [facility])     Past Surgical History:  Procedure Laterality Date   BUBBLE STUDY  01/29/2020   Procedure: BUBBLE STUDY;  Surgeon: [name], MD;  Location: [place];  Service: Cardiovascular;;   TEE WITHOUT CARDIOVERSION N/A 01/29/2020   Procedure: TRANSESOPHAGEAL ECHOCARDIOGRAM (TEE);  Surgeon: [name], MD;  Location: [facility] ENDOSCOPY;  Service: Cardiovascular;  Laterality: N/A;    There were no vitals filed for this visit.   Subjective Assessment - 06/14/21 1016     Subjective Went the ED last week due to back pain, but feeling better now. Ready to discharge today due to going back to [place]. No falls.    Pertinent History prior CVA, hypothyroidism, elevated BMI    How long can you sit comfortably? no issues    How long can you stand comfortably? No issues    How  long can you walk comfortably? 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 Yes    Pain Score 2     Pain Location Back    Pain Orientation Right;Mid    Pain Descriptors / Indicators Aching    Pain Type Acute pain    Pain Onset In the past 7 days    Aggravating Factors  slouching, reclining.    Pain Relieving Factors meds, repositioning, heat                [facility] PT Assessment - 06/14/21 1021       Timed Up and Go Test   Normal TUG (seconds) 11.63   no AD     Functional Gait  Assessment   Gait assessed  Yes    Gait Level Surface Walks 20 ft in less than 7 sec but greater than 5.5 sec, uses assistive device, slower speed, mild gait deviations, or deviates 6-10 in outside of the 12 in walkway width.   6.43   Change in Gait Speed Able to smoothly change  walking speed without loss of balance or gait deviation. Deviate no more than 6 in outside of the 12 in walkway width.    Gait with Horizontal Head Turns Performs head turns smoothly with slight change in gait velocity (eg, minor disruption to smooth gait path), deviates 6-10 in outside 12 in walkway width, or uses an assistive device.    Gait with Vertical Head Turns Performs task with slight change in gait velocity (eg, minor disruption to smooth gait path), deviates 6 - 10 in outside 12 in walkway width or uses assistive device    Gait and Pivot Turn Pivot turns safely within 3 sec and stops quickly with no loss of balance.    Step Over Obstacle Is able to step over one shoe box (4.5 in total height) but must slow down and adjust steps to clear box safely. May require verbal cueing.    Gait with Narrow Base of Support Ambulates less than 4 steps heel to toe or cannot perform without assistance.   3 steps   Gait with Eyes Closed Walks 20  ft, slow speed, abnormal gait pattern, evidence for imbalance, deviates 10-15 in outside 12 in walkway width. Requires more than 9 sec to ambulate 20 ft.   10.78   Ambulating Backwards Walks 20 ft, uses assistive device, slower speed, mild gait deviations, deviates 6-10 in outside 12 in walkway width.   15.56 seconds   Steps Alternating feet, must use rail.    Total Score 18    FGA comment: 18/30 = high fall risk                           [facility] Adult PT Treatment/Exercise - 06/14/21 1021       Transfers   Transfers Sit to Stand;Stand to Sit;Supine to Sit    Sit to Stand 5: Supervision    Five time sit to stand comments  16.4 seconds no UE support    Stand to Sit 5: Supervision      Ambulation/Gait   Ambulation/Gait Yes    Ambulation/Gait Assistance 6: Modified independent (Device/Increase time)    Assistive device None    Gait Pattern Step-through pattern;Decreased weight shift to right    Ambulation Surface Level;Indoor    Gait velocity 11.37 seconds = 2.88 ft/sec               Final HEP. Reviewed exercises with pt with pt having no questions. See MedBridge for more details.   Access Code: [email or web address] URL: [email or web address] Date: 06/14/2021 Prepared by: Janann August  Exercises Supine Bridge - 2 x daily - 5 x weekly - 1 sets - 10 reps Staggered Sit-to-Stand - 1 x daily - 5 x weekly - 1-2 sets - 5 reps Standing Single Leg Stance with Counter Support - 2 x daily - 5 x weekly - 3 sets - 10 hold Standing Balance with Eyes Closed - 1 x daily - 5 x weekly - 3 sets - 10 hold Alternating Step Taps with Counter Support - 1 x daily - 5 x weekly - 2 sets - 10 reps Heel Raises with Counter Support - 1 x daily - 5 x weekly - 1-2 sets - 10 reps  New addition on 06/14/21: Standing with Head Rotation - 1 x daily - 5 x weekly - 2 sets - 10 reps - on pillow x10 reps, and x10 reps head nods     PHYSICAL THERAPY DISCHARGE SUMMARY  Visits from  Start  of Care: 10  Current functional level related to goals / functional outcomes: See LTGs/clinical assessment statement.   Remaining deficits: Impaired high level balance, decr strength.   Education / Equipment: HEP, walking program.   Patient agrees to discharge. Patient goals were met. Patient is being discharged due to meeting the stated rehab goals. And being pleased with current functional status. Pt also is planning on going to [place] for a couple months to be with her grand kids.    PT Education - 06/14/21 1059     Education Details Final HEP, progress towards goals.    Person(s) Educated Patient    Methods Explanation;Demonstration;Handout    Comprehension Verbalized understanding;Returned demonstration              PT Short Term Goals - 06/14/21 1020       PT SHORT TERM GOAL #1   Title Pt will undergo further assessment of FGA with LTG written. ALL STGS DUE 06/07/21    Baseline 18/30 on 06/14/21    Time 4    Period Weeks    Status Achieved    Target Date 06/07/21      PT SHORT TERM GOAL #2   Title Pt will have improved 5x STS to </=20 sec    Baseline 30.1 s with UE support, 22 seconds with BUE support on 04/24/21; 16.4 seconds no UE support on 06/14/21    Time 4    Period Weeks    Status Achieved      PT SHORT TERM GOAL #3   Title Pt will decr TUG to 14 seconds with no AD in order to demo decr fall risk.    Baseline 16.21 seconds with no AD; 11.63 seconds no AD    Time 4    Period Weeks    Status Achieved      PT SHORT TERM GOAL #4   Title Pt will undergo further assessment of gait speed with no AD with LTG written.    Baseline 11.37 seconds = 2.88 ft/sec    Time 4    Period Weeks    Status Achieved               PT Long Term Goals - 06/14/21 1020       PT LONG TERM GOAL #1   Title Pt will be independent with self progressing her strengthening HEP. ALL LTGS DUE 07/05/21    Baseline [email or web address], reviwed on 06/14/21    Time 8    Period Weeks    Status  Achieved    Target Date 07/05/21      PT LONG TERM GOAL #2   Title Pt will have improved 5x STS </=15 sec to demo low fall risk    Baseline 30.1s; 22 seconds with BUE support on 04/24/21    Time 8    Period Weeks    Status On-going      PT LONG TERM GOAL #3   Title Pt will have improved TUG score of </=13.5 sec to demo low fall risk with no A/D    Baseline TUG score 27s w/o AD; 16.21 seconds no AD, 11.63 seconds no AD    Time 8    Period Weeks    Status Achieved      PT LONG TERM GOAL #4   Title FGA goal to be written as appropriate.    Baseline just performed on 06/14/21    Time 8    Period Weeks  Status New      PT LONG TERM GOAL #5   Title Gait speed goal with no AD to be written.    Baseline 11.37 seconds = 2.88 ft/sec    Time 8    Period Weeks    Status New                   Plan - 06/14/21 1148     Clinical Impression Statement Today's skilled session focused on assessing pt's LTGs for D/C. Pt is pleased with her progress and wants to wrap up with therapy at this time due to pt going to [place] for a couple months to visit her grand kids. Pt met all 4 of her STGs. Pt improved 5x sit <> stand to 16.4 seconds with no UE support (previously 22 seconds with BUE support). Pt decr TUG time to 11.63 seconds with no AD indicating decr fall risk. 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 Assessed FGA today with pt scoring an 18/30, indicating a high fall risk. Educated to continue to use cane for longer distances/compliant surfaces. Reviewed pt's HEP today with no issues. Pt in agreement to D/C from PT at this time. Discussed in the future when pt returns to [facility] if pt has a decline in strength/balance, then would need a new referral to return.    Personal Factors and Comorbidities Age;Fitness;Comorbidity 1;Time since onset of injury/illness/exacerbation    Comorbidities prior CVA, hypothyroidism, elevated BMI     Examination-Activity Limitations Locomotion Level;Transfers;Stand;Stairs;Squat;Carry;Lift;Bend    Examination-Participation Restrictions Cleaning;Occupation;Community Activity;Shop;Yard Work    Merchant navy officer Evolving/Moderate complexity    Rehab Potential Good    PT Frequency 1x / week    PT Duration 8 weeks    PT Treatment/Interventions ADLs/Self Care Home Management;Aquatic Therapy;Moist Heat;DME Instruction;Gait training;Stair training;Functional mobility training;Therapeutic activities;Therapeutic exercise;Balance training;Neuromuscular re-education;Manual techniques;Patient/family education;Passive range of motion;Energy conservation    PT Next Visit Plan D/C    PT Home Exercise Plan [email or web address]    Consulted and Agree with Plan of Care Patient             Patient will benefit from skilled therapeutic intervention in order to improve the following deficits and impairments:  Difficulty walking, Decreased activity tolerance, Decreased balance, Decreased mobility, Decreased strength, Decreased cognition, Abnormal gait, Decreased safety awareness, Dizziness  Visit Diagnosis: Unsteadiness on feet  Other lack of coordination  Muscle weakness (generalized)     Problem List Patient Active Problem List   Diagnosis Date Noted   Right shoulder pain 05/23/2021   Healthcare maintenance 03/07/2021   History of DVT (deep vein thrombosis) 10/06/2020   Family history of premature coronary heart disease 09/14/2020   OSA (obstructive sleep apnea) 09/14/2020   Smokes less than 1/2 pack a day with greater than 15 pack year history 09/14/2020   Elevated liver function tests 02/05/2020   Pre-diabetes 02/03/2020   Subclinical hypothyroidism 02/03/2020   Tobacco use disorder 01/27/2020   Hyperlipemia 01/27/2020   Obesity 01/27/2020   History of CVA with residual deficit 01/26/2020   Extrinsic asthma 10/09/2004    Arliss Journey, PT, DPT  06/14/2021, 11:51 AM  [facility] [address] [place], [place], [address] Phone: [phone]   Fax:  [phone]  Name: Maria Porter MRN: [id number] Date of Birth: May 16, 1975

## 2021-06-14 NOTE — Therapy (Signed)
[facility] [address] [place], [place], [address] Phone: [phone]   Fax:  [phone]  Occupational Therapy Treatment  Patient Details  Name: Maria Porter MRN: [id number] Date of Birth: [birth year] Referring Provider (OT): Dr. Evette Doffing   Encounter Date: 06/14/2021   OT End of Session - 06/14/21 1001     Visit Number 13    Number of Visits 25    Date for OT Re-Evaluation 06/21/21    Authorization Type UHC Medicaid    OT Start Time 0930    OT Stop Time 1008    OT Time Calculation (min) 38 min    Activity Tolerance Patient tolerated treatment well    Behavior During Therapy [facility] for tasks assessed/performed             Past Medical History:  Diagnosis Date   Acute deep vein thrombosis of right iliac vein ([place]) 09/14/2020   Stroke [facility])     Past Surgical History:  Procedure Laterality Date   BUBBLE STUDY  01/29/2020   Procedure: BUBBLE STUDY;  Surgeon: [name], MD;  Location: [facility];  Service: Cardiovascular;;   TEE WITHOUT CARDIOVERSION N/A 01/29/2020   Procedure: TRANSESOPHAGEAL ECHOCARDIOGRAM (TEE);  Surgeon: [name], MD;  Location: [facility] ENDOSCOPY;  Service: Cardiovascular;  Laterality: N/A;    There were no vitals filed for this visit.   Subjective Assessment - 06/14/21 0930     Subjective  I went to the ED last week due to back pain, but I'm better now    Pertinent History CVA      PMH:  hx of prior CVA (approx 4 yrs ago affecting L side, 01/2020 affecting R side, and 09/11/20 affecting R side); obesity, hyperlipidemia, sleep apnea    Patient Stated Goals get back to using my Rt arm, improve ability to do crafts    Currently in Pain? Yes    Pain Score 2     Pain Location Back    Pain Orientation Right;Mid    Pain Descriptors / Indicators Aching    Pain Type Acute pain    Pain Onset In the past 7 days    Pain Frequency Intermittent    Aggravating Factors   slouching, reclining    Pain Relieving Factors meds, repositioning             Assessed remaining goals and progress to date - see below. Reviewed progress with patient.   Reviewed ex's in prone (scapula retraction, sh ext, and prone on elbows) and bilateral shoulder flexion ex in supine for NMR  RUE  UBE x 8 min. Level 1 for shoulder ROM and UB conditioning                       OT Short Term Goals - 06/14/21 0957       OT SHORT TERM GOAL #1   Title Pt will be independent with initial HEP.    Baseline no HEP    Time 4    Period Weeks    Status Achieved      OT SHORT TERM GOAL #2   Title Pt will demo at least 85* R shoulder flex for functional reach.    Baseline 60-70*    Time 4    Period Weeks    Status Achieved   04/27/21:  85* with 1-2/10 pain     OT SHORT TERM GOAL #3   Title Pt will improve R hand coordination  for ADLs/IADLs as shown by improving score on box and blocks test by at least 10 blocks.    Baseline 27 blocks    Time 4    Period Weeks    Status Achieved   04/27/21:  33 blocks, 05/24/21: 46 blocks     OT SHORT TERM GOAL #4   Title Pt will improve R hand grip strength to at least 15 lbs to assist in opening bottles/containers.    Baseline 6.1 lbs    Time 4    Period Weeks    Status Achieved   04/27/21:  9.9lbs, 05/24/21: 12 lbs, 06/14/21: 24.9 lbs              OT Long Term Goals - 06/14/21 0957       OT LONG TERM GOAL #1   Title Pt will verbalize understanding of AE/strategies for incr safety/independence with ADLs/IADLs  (including cognitive and visual strategies prn, and for opening jars/medicine bottles)    Baseline dependent, needs education    Time 12    Period Weeks    Status Achieved      OT LONG TERM GOAL #2   Title Pt will demo at least 90* R shoulder flex for functional reach.    Baseline 60-70*    Time 12    Period Weeks    Status Achieved   85-90* upright, 90-95* supine     OT LONG TERM GOAL #3   Title Pt  will improve R hand coordination for ADLs/IADLs and leisure tasks as shown by completing 9-hole peg test in 30 sec or less.    Baseline 35.94 sec    Time 12    Period Weeks    Status Achieved   19 sec     OT LONG TERM GOAL #4   Title Pt will improve R hand grip strength to at least 20 lbs to assist in opening bottles/containers.    Baseline 6.1 lbs    Time 12    Period Weeks    Status Achieved   24.9 lbs     OT LONG TERM GOAL #5   Title Pt will tolerate shoulder P/ROM to 110* w/ pain less than or equal to 3/10    Baseline only to 85/90*    Time 12    Period Weeks    Status Not Met   90*                  Plan - 06/14/21 0959     Clinical Impression Statement Pt's xray of Rt shoulder showed spurring on inferior aspect of acromion. Pt overall has done better with Rt shoulder in low to mid ranges, however mid to high ranges remain limited. Pt has met all STG's and 4/5 LTG's.    OT Occupational Profile and History Detailed Assessment- Review of Records and additional review of physical, cognitive, psychosocial history related to current functional performance    Occupational performance deficits (Please refer to evaluation for details): ADL's;IADL's;Leisure    Body Structure / Function / Physical Skills ADL;Decreased knowledge of use of DME;Strength;Dexterity;Balance;UE functional use;Proprioception;ROM;IADL;Vision;Sensation;Mobility;Coordination;FMC    Cognitive Skills Attention;Memory;Problem Solve;Sequencing;Safety Awareness    Rehab Potential Good    Clinical Decision Making Several treatment options, min-mod task modification necessary    Comorbidities Affecting Occupational Performance: May have comorbidities impacting occupational performance    Modification or Assistance to Complete Evaluation  Min-Moderate modification of tasks or assist with assess necessary to complete eval    OT Frequency 2x /  week    OT Duration 12 weeks   PLUS EVAL (anticipate only 8 weeks)   OT  Treatment/Interventions Self-care/ADL training;Moist Heat;Fluidtherapy;Splinting;Balance training;Therapeutic activities;Therapeutic exercise;Ultrasound;Cognitive remediation/compensation;Visual/perceptual remediation/compensation;Passive range of motion;Functional Mobility Training;Neuromuscular education;Cryotherapy;Energy conservation;Manual Therapy;Patient/family education;Paraffin;DME and/or AE instruction;Coping strategies training    Plan D/C O.T.    Recommended Other Services Speech therapy - to schedule beginning in January when visits start over    Consulted and Agree with Plan of Care Patient             Patient will benefit from skilled therapeutic intervention in order to improve the following deficits and impairments:   Body Structure / Function / Physical Skills: ADL, Decreased knowledge of use of DME, Strength, Dexterity, Balance, UE functional use, Proprioception, ROM, IADL, Vision, Sensation, Mobility, Coordination, [facility] Cognitive Skills: Attention, Memory, Problem Solve, Sequencing, Safety Awareness     Visit Diagnosis: Hemiplegia and hemiparesis following cerebral infarction affecting right non-dominant side (HCC)  Chronic right shoulder pain  Stiffness of right shoulder, not elsewhere classified  Muscle weakness (generalized)  Other lack of coordination    Problem List Patient Active Problem List   Diagnosis Date Noted   Right shoulder pain 05/23/2021   Healthcare maintenance 03/07/2021   History of DVT (deep vein thrombosis) 10/06/2020   Family history of premature coronary heart disease 09/14/2020   OSA (obstructive sleep apnea) 09/14/2020   Smokes less than 1/2 pack a day with greater than 15 pack year history 09/14/2020   Elevated liver function tests 02/05/2020   Pre-diabetes 02/03/2020   Subclinical hypothyroidism 02/03/2020   Tobacco use disorder 01/27/2020   Hyperlipemia 01/27/2020   Obesity 01/27/2020   History of CVA with residual deficit  01/26/2020   Extrinsic asthma 10/09/2004    OCCUPATIONAL THERAPY DISCHARGE SUMMARY  Visits from Start of Care: 13  Current functional level related to goals / functional outcomes: See above   Remaining deficits: Rt shoulder ROM and pain past midrange   Education / Equipment: HEP's, A/E recommendations   Patient agrees to discharge. Patient goals were partially met. Patient is being discharged due to maximized rehab potential.  Pt also going out of town for a month.     Carey Bullocks, OTR/L 06/14/2021, 10:02 AM  [facility] [address] [place], [place], [address] Phone: [phone]   Fax:  [phone]  Name: Miliani Deike MRN: [id number] Date of Birth: [birth year]

## 2021-06-14 NOTE — Telephone Encounter (Signed)
Patient lvm on machine with number to reach her [phone] (same number listed in chart).   Transition Care Management Unsuccessful Follow-up Telephone Call  Date of discharge and from where:  06/09/2021 from [facility]  Attempts:  3rd Attempt  Reason for unsuccessful TCM follow-up call:  Unable to leave message  Patient answered phone and stated that she was in therapy and would call back.

## 2021-06-14 NOTE — Therapy (Addendum)
[facility] [address] [place], [place], [address] Phone: [phone]   Fax:  [phone]  Speech Language Pathology Treatment/Discharge Summary  Patient Details  Name: Maria Porter MRN: [id number] Date of Birth: 02-10-1976 Referring Provider (SLP): Dr. Axel Filler   Encounter Date: 06/14/2021   End of Session - 06/14/21 1022     Visit Number 4    Number of Visits 10    Date for SLP Re-Evaluation 07/10/21    Authorization Type medicaid - is receiving PT and OT as well. As it is January, I will request 6 visits    Authorization - Visit Number 3    Authorization - Number of Visits 6    SLP Start Time 1050    SLP Stop Time  1129    SLP Time Calculation (min) 39 min    Activity Tolerance Patient tolerated treatment well             Past Medical History:  Diagnosis Date   Acute deep vein thrombosis of right iliac vein ([place]) 09/14/2020   Stroke [facility])     Past Surgical History:  Procedure Laterality Date   BUBBLE STUDY  01/29/2020   Procedure: BUBBLE STUDY;  Surgeon: [name], MD;  Location: [facility] ENDOSCOPY;  Service: Cardiovascular;;   TEE WITHOUT CARDIOVERSION N/A 01/29/2020   Procedure: TRANSESOPHAGEAL ECHOCARDIOGRAM (TEE);  Surgeon: [name], MD;  Location: [facility] ENDOSCOPY;  Service: Cardiovascular;  Laterality: N/A;    There were no vitals filed for this visit.   Subjective Assessment - 06/14/21 1050     Subjective "the sentences went okay. I've trying to talk slow"    Currently in Pain? Yes    Pain Score 2     Pain Location Back              SPEECH THERAPY DISCHARGE SUMMARY  Visits from Start of Care: 4  Current functional level related to goals / functional outcomes: Pt expects to return to [place] and requested ST discharge this date despite limited ST visits completed. Pt agreeable to continue usage of multimodal communication, slow rate to aid speech  intelligibility, and use anomia compensations when word finding occurs.     Remaining deficits: Foreign accent syndrome, mild aphasia/apraxia    Education / Equipment: Multimodal communication, anomia compensations, HEP   Patient agrees to discharge. Patient goals were partially met. Patient is being discharged due to the patient's request.     ADULT SLP TREATMENT - 06/14/21 1021       General Information   Behavior/Cognition Alert;Cooperative;Pleasant mood      Treatment Provided   Treatment provided Cognitive-Linquistic      Cognitive-Linquistic Treatment   Treatment focused on Aphasia;Apraxia;Patient/family/caregiver education    Skilled Treatment Pt returned with HEP completed targeting naming and writing. Occasional mod A required to aid sentence formation/syntax and min A for word finding. Pt requested ST discharge this date per patient request on last scheduled visit. Communication Participation Item Bank re-adminstered today, with improvements indicated with score of 15 (initial score of 9). Pt verbalized understanding of SLP education and recommendations to continue after ST discharge.      Assessment / Recommendations / Plan   Plan Discharge SLP treatment due to (comment)   pt requested discharge     Progression Toward Goals   Progression toward goals Goals partially met, education completed, patient discharged from SLP              SLP  Education - 06/14/21 1022     Education Details discharge summary, SLP recommendations/compensatory strategies    Person(s) Educated Patient    Methods Explanation;Demonstration    Comprehension Verbalized understanding;Returned demonstration              SLP Short Term Goals - 06/14/21 1023       SLP SHORT TERM GOAL #1   Title In structured language tasks, pt will use verbal and/or non verbal compensations for word finding episodes with occasional min A    Baseline no strategies, just giving up; 06-07-21, 06-14-21     Status Achieved      SLP SHORT TERM GOAL #2   Title Pt will carryover compensations for verbal apraxia to be 90% intellgible in complex conversation over 10 minutes with rare min A    Baseline uses compensations inconsistently resulting in reduced intellgibility; 06-14-21    Status Partially Met      SLP SHORT TERM GOAL #3   Title Pt will use compensatory strategies to complete 3 business/community interactions or phone calls with occasional min A    Baseline sister is handling all business/community interactions for her    Status Partially Met      SLP SHORT TERM GOAL #4   Title Pt will ID and self correct written errors at sentence level 18/20 sentences with rare min A    Baseline ID's errors, but unable to self correct; 06-07-21, 06-14-21    Status Achieved              SLP Long Term Goals - 06/14/21 1024       SLP LONG TERM GOAL #1   Title Pt will carryover verbal and non verbal compensations for aphasia as needed over 18 minute moderately complex conversation with rare min A    Baseline no strategies, just giving up    Status Partially Met      SLP LONG TERM GOAL #2   Title Pt will name 15 items in personally relevant categories with occasional min A over 2 sessions    Baseline 4 items in a simple category    Status Partially Met      SLP LONG TERM GOAL #3   Title Pt will carryover compensations for dysarthria to be 100% intelligible over 20 minute conversation over 2 visits    Baseline I needed her to repeat herself 3x during evaluation    Status Partially Met      SLP LONG TERM GOAL #4   Title Pt will ID and self correct errors in simple 3 -4 sentence written paragraph with occasional min A    Baseline did not ID or correct errors, no compensation for spelling difficulty    Status Partially Met      SLP LONG TERM GOAL #5   Title Pt will improve her score on the Communication Participation Item Bank short form by 2 points    Baseline initial score of 9    Status  Achieved              Plan - 06/14/21 1107     Clinical Impression Statement Maria Porter continues to present with mild aphasia, apraxia, and dysarthria (? foreign accent syndrome) and Maria Porter accent. Targeted written expression at sentence level with occasional min to mod A for sentence syntax. Conversational speech benefited from occasional cues to slow rate to optimize articulatory precision and listener comprehension. Pt requested ST discharge today per patient request on last scheduled visit.    Speech  Therapy Frequency 1x /week    Duration 12 weeks    Treatment/Interventions Language facilitation;Environmental controls;Cueing hierarchy;Oral motor exercises;SLP instruction and feedback;Compensatory strategies;Functional tasks;Cognitive reorganization;Compensatory techniques;Patient/family education;Multimodal communcation approach;Internal/external aids    Potential to Achieve Goals Good             Patient will benefit from skilled therapeutic intervention in order to improve the following deficits and impairments:   Aphasia  Verbal apraxia    Problem List Patient Active Problem List   Diagnosis Date Noted   Right shoulder pain 05/23/2021   Healthcare maintenance 03/07/2021   History of DVT (deep vein thrombosis) 10/06/2020   Family history of premature coronary heart disease 09/14/2020   OSA (obstructive sleep apnea) 09/14/2020   Smokes less than 1/2 pack a day with greater than 15 pack year history 09/14/2020   Elevated liver function tests 02/05/2020   Pre-diabetes 02/03/2020   Subclinical hypothyroidism 02/03/2020   Tobacco use disorder 01/27/2020   Hyperlipemia 01/27/2020   Obesity 01/27/2020   History of CVA with residual deficit 01/26/2020   Extrinsic asthma 10/09/2004    Alinda Deem, CCC-SLP 06/14/2021, 11:59 AM  [facility] [address] [place], [place], [address] Phone: [phone]    Fax:  [phone]   Name: Briyah Wheelwright MRN: [id number] Date of Birth: 07/21/1975

## 2021-06-14 NOTE — Patient Instructions (Signed)
Access Code: [email or web address] URL: [email or web address] Date: 06/14/2021 Prepared by: Sherlie Ban  Exercises Supine Bridge - 2 x daily - 5 x weekly - 1 sets - 10 reps Staggered Sit-to-Stand - 1 x daily - 5 x weekly - 1-2 sets - 5 reps Standing Single Leg Stance with Counter Support - 2 x daily - 5 x weekly - 3 sets - 10 hold Standing Balance with Eyes Closed - 1 x daily - 5 x weekly - 3 sets - 10 hold Alternating Step Taps with Counter Support - 1 x daily - 5 x weekly - 2 sets - 10 reps Heel Raises with Counter Support - 1 x daily - 5 x weekly - 1-2 sets - 10 reps Standing with Head Rotation - 1 x daily - 5 x weekly - 2 sets - 10 reps

## 2021-06-15 NOTE — Telephone Encounter (Signed)
Transition Care Management Follow-up Telephone Call Date of discharge and from where: 06/09/2021 from [facility] How have you been since you were released from the hospital? Patient stated that she is feeling a lot better.  Any questions or concerns? No  Items Reviewed: Did the pt receive and understand the discharge instructions provided? Yes  Medications obtained and verified? Yes  Other? No  Any new allergies since your discharge? No  Dietary orders reviewed? No Do you have support at home? Yes   Functional Questionnaire: (I = Independent and D = Dependent) ADLs: I some dependence due residual affects of stroke.   Bathing/Dressing- I  Meal Prep- I  Eating- I  Maintaining continence- I  Transferring/Ambulation- I  Managing Meds- I   Follow up appointments reviewed:  PCP Hospital f/u appt confirmed? Yes  Scheduled to see Belva Agee, MD on 07/06/2021 @ 10:45am. Specialist Hospital f/u appt confirmed? Yes  Scheduled to see Huston Foley, MD on 07/18/2021 @ 11:15am. Are transportation arrangements needed? No  If their condition worsens, is the pt aware to call PCP or go to the Emergency Dept.? Yes Was the patient provided with contact information for the PCP's office or ED? Yes Was to pt encouraged to call back with questions or concerns? Yes

## 2021-06-17 ENCOUNTER — Other Ambulatory Visit: Payer: Self-pay | Admitting: Student

## 2021-06-17 DIAGNOSIS — E039 Hypothyroidism, unspecified: Secondary | ICD-10-CM

## 2021-07-05 ENCOUNTER — Institutional Professional Consult (permissible substitution): Payer: Medicaid Other | Admitting: Behavioral Health

## 2021-07-05 ENCOUNTER — Telehealth: Payer: Self-pay | Admitting: Behavioral Health

## 2021-07-05 NOTE — Telephone Encounter (Signed)
Lft Pt 3 msgs today for [facility] telehealth session. Requested Pt RC to [facility] & r/s @ her convenience. ? ?Dr. Monna Fam ?

## 2021-07-06 ENCOUNTER — Other Ambulatory Visit (HOSPITAL_COMMUNITY)
Admission: RE | Admit: 2021-07-06 | Discharge: 2021-07-06 | Disposition: A | Discharge status: Home / Self Care | Payer: Medicaid Other | Source: Ambulatory Visit | Attending: Internal Medicine | Admitting: Internal Medicine

## 2021-07-06 ENCOUNTER — Other Ambulatory Visit: Payer: Self-pay | Admitting: Internal Medicine

## 2021-07-06 ENCOUNTER — Ambulatory Visit (INDEPENDENT_AMBULATORY_CARE_PROVIDER_SITE_OTHER): Payer: Medicaid Other | Admitting: Student

## 2021-07-06 DIAGNOSIS — H1032 Unspecified acute conjunctivitis, left eye: Secondary | ICD-10-CM | POA: Diagnosis not present

## 2021-07-06 DIAGNOSIS — Z124 Encounter for screening for malignant neoplasm of cervix: Secondary | ICD-10-CM

## 2021-07-06 DIAGNOSIS — Z0000 Encounter for general adult medical examination without abnormal findings: Secondary | ICD-10-CM | POA: Insufficient documentation

## 2021-07-06 DIAGNOSIS — H109 Unspecified conjunctivitis: Secondary | ICD-10-CM | POA: Insufficient documentation

## 2021-07-06 DIAGNOSIS — I6930 Unspecified sequelae of cerebral infarction: Secondary | ICD-10-CM

## 2021-07-06 MED ORDER — ASPIRIN EC 81 MG PO TBEC: 81.0000 mg | DELAYED_RELEASE_TABLET | Freq: Every day | ORAL | 11 refills | Status: DC

## 2021-07-06 NOTE — Assessment & Plan Note (Signed)
Patient with erythema of her left medial conjunctive.  States she used a new facial cream recently and that she believes some got into her eye and irritated it.  She endorses drainage from the eye in the evening.  There is no matting present.  It is not present in the other eye.  I suspect that this is from facial cream and instructed patient continue to monitor.  She is instructed call our clinic if this worsens. ?

## 2021-07-06 NOTE — Progress Notes (Signed)
? ?CC: Erythema of left eye, Pap smear ? ?HPI: ? ?[name] is a 46 y.o. female with a past medical history stated below and presents today for Pap smear and follow-up erythema of her left thigh. Please see problem based assessment and plan for additional details. ? ?Past Medical History:  ?Diagnosis Date  ? Acute deep vein thrombosis of right iliac vein ([facility]) 09/14/2020  ? Stroke [facility])   ? ? ?Current Outpatient Medications on File Prior to Visit  ?Medication Sig Dispense Refill  ? levothyroxine (SYNTHROID) 150 MCG tablet TAKE 1 TABLET BY MOUTH EVERY DAY 90 tablet 0  ? albuterol (PROVENTIL) (2.5 MG/3ML) 0.083% nebulizer solution Take 3 mLs (2.5 mg total) by nebulization every 4 (four) hours as needed for wheezing or shortness of breath. 75 mL 2  ? albuterol (VENTOLIN HFA) 108 (90 Base) MCG/ACT inhaler Inhale 1-2 puffs into the lungs every 6 (six) hours as needed for wheezing or shortness of breath. 8.5 g 3  ? atorvastatin (LIPITOR) 40 MG tablet TAKE 1 TABLET BY MOUTH EVERY DAY 180 tablet 3  ? methocarbamol (ROBAXIN) 500 MG tablet Take 1 tablet (500 mg total) by mouth at bedtime. Do not operate vehicles or heavy machinery within 12 hours of this ingestion. 10 tablet 0  ? nicotine (NICODERM CQ - DOSED IN MG/24 HOURS) 21 mg/24hr patch Place 1 patch (21 mg total) onto the skin daily. 30 patch 1  ? ?No current facility-administered medications on file prior to visit.  ? ? ?No family history on file. ? ?Social History  ? ?Socioeconomic History  ? Marital status: Single  ?  Spouse name: Not on file  ? Number of children: Not on file  ? Years of education: Not on file  ? Highest education level: Not on file  ?Occupational History  ? Not on file  ?Tobacco Use  ? Smoking status: Every Day  ?  Packs/day: 1.00  ?  Types: Cigarettes  ?  Start date: 03/07/2020  ? Smokeless tobacco: Never  ? Tobacco comments:  ?  1 pk per day   ?Substance and Sexual Activity  ? Alcohol use: Yes  ? Drug use: Not on file  ? Sexual activity:  Not on file  ?Other Topics Concern  ? Not on file  ?Social History Narrative  ? Lives with sister  ? Left Handed  ? Drinks 2-3 cups caffeine daily  ? ?Social Determinants of Health  ? ?Financial Resource Strain: Not on file  ?Food Insecurity: Not on file  ?Transportation Needs: Not on file  ?Physical Activity: Not on file  ?Stress: Not on file  ?Social Connections: Not on file  ?Intimate Partner Violence: Not on file  ? ?Review of Systems: ?ROS negative except for what is noted on the assessment and plan. ? ?There were no vitals filed for this visit. ? ?Physical Exam: ?Constitutional: Well-appearing, no acute distress ?HENT: normocephalic atraumatic, erythema medial aspect of sclera of left eye ?Eyes: conjunctiva non-erythematous ?Neck: supple ?Pulmonary/Chest: normal work of breathing on room air ?MSK: normal bulk and tone ?Neurological: alert & oriented x 3 ?Skin: warm and dry ?Psych: Normal mood and thought process ? ?Pelvic Exam ?External genitalia unremarkable. ?Speculum exam with normal appearing whitish vaginal discharge. ?Vaginal wall mucosa is unremarkable. ?Cervix visualized and is unremarkable closed in appearance without any protruding material ?Swabs for testing for gonorrhea, chlamydia and wet prep were obtained. ? ?Chaperones Dr. Heide Spark and Andi Hence ? ? ?Assessment & Plan:  ? ?See Encounters Tab for  problem based charting. ? ?Patient seen with Dr. Heide Spark ? ?Thalia Bloodgood, D.O. ?[facility] Internal Medicine, PGY-2 ?Pager: [phone], Phone: [phone] ?Date 07/06/2021 Time 5:41 PM  ?

## 2021-07-06 NOTE — Assessment & Plan Note (Addendum)
Pap smear performed today.  Supervised by Dr. Heide Spark.  Exam unremarkable please see note for further details.  Pending HPV, gonorrhea, committee a, trichomoniasis testing ? ?Addendum:  ?Pap smear, cytology and HPV negative. STI testing unremarkable. Next pap in 5 years ?

## 2021-07-06 NOTE — Assessment & Plan Note (Signed)
Patient following with neurology.  She continues to have some right-sided weakness but has improved significantly.  She continues to use her cane to help her with ambulation.  We will discontinue Eliquis, I do not see reason for her to continue this and start on 81 mg aspirin daily ?

## 2021-07-06 NOTE — Patient Instructions (Addendum)
Thank you, Ms.[name] for allowing [place] to provide your care today. Today we discussed . ? ?Pap-Smear ?We have performed your pap smear today and sent it for testing. I will call you for abnormal results.  ? ?Redness of eye ?I believe that this is irritated from the face wash you used. Please call the clinic if it does not improve, I do not believe you need any eye drops at this time.  ? ?History of Stroke and Blood clot ?We will take you off of the eliquis and continue taking an aspirin a day.    ? ?I have ordered the following labs for you: ? ?Lab Orders  ?No laboratory test(s) ordered today  ?  ?Referrals ordered today:  ? ?Referral Orders  ?No referral(s) requested today  ?  ? ?I have ordered the following medication/changed the following medications:  ? ?Stop the following medications: ?Medications Discontinued During This Encounter  ?Medication Reason  ? ELIQUIS 5 MG TABS tablet   ?  ? ?Start the following medications: ?Meds ordered this encounter  ?Medications  ? aspirin EC 81 MG tablet  ?  Sig: Take 1 tablet (81 mg total) by mouth daily. Swallow whole.  ?  Dispense:  30 tablet  ?  Refill:  11  ?  ? ?Follow up: 4-6 months  ? ?Should you have any questions or concerns please call the internal medicine clinic at [phone].   ? ?Sanjuana Letters, D.O. ?[facility] ?  ?

## 2021-07-07 NOTE — Progress Notes (Signed)
Internal Medicine Clinic Attending  Case discussed with Dr. Katsadouros  At the time of the visit.  We reviewed the resident's history and exam and pertinent patient test results.  I agree with the assessment, diagnosis, and plan of care documented in the resident's note.  

## 2021-07-11 LAB — CYTOLOGY - PAP
Chlamydia: NEGATIVE
Comment: NEGATIVE
Comment: NEGATIVE
Comment: NEGATIVE
Comment: NORMAL
Diagnosis: NEGATIVE
High risk HPV: NEGATIVE
Neisseria Gonorrhea: NEGATIVE
Trichomonas: NEGATIVE

## 2021-07-18 ENCOUNTER — Institutional Professional Consult (permissible substitution): Payer: Medicaid Other | Admitting: Neurology

## 2021-07-20 ENCOUNTER — Ambulatory Visit (INDEPENDENT_AMBULATORY_CARE_PROVIDER_SITE_OTHER): Payer: Medicaid Other | Admitting: Student

## 2021-07-20 ENCOUNTER — Other Ambulatory Visit: Payer: Self-pay | Admitting: Student

## 2021-07-20 VITALS — BP 128/96 | HR 83 | Temp 98.1°F | Ht 66.0 in | Wt 201.5 lb

## 2021-07-20 DIAGNOSIS — K219 Gastro-esophageal reflux disease without esophagitis: Secondary | ICD-10-CM

## 2021-07-20 DIAGNOSIS — K5900 Constipation, unspecified: Secondary | ICD-10-CM | POA: Insufficient documentation

## 2021-07-20 DIAGNOSIS — F17200 Nicotine dependence, unspecified, uncomplicated: Secondary | ICD-10-CM | POA: Diagnosis not present

## 2021-07-20 DIAGNOSIS — H1032 Unspecified acute conjunctivitis, left eye: Secondary | ICD-10-CM

## 2021-07-20 MED ORDER — CETIRIZINE HCL 10 MG PO TABS: 10.0000 mg | ORAL_TABLET | Freq: Every day | ORAL | 2 refills | Status: DC

## 2021-07-20 MED ORDER — FLUTICASONE PROPIONATE 50 MCG/ACT NA SUSP: 1.0000 | Freq: Every day | NASAL | 2 refills | Status: DC

## 2021-07-20 MED ORDER — BUPROPION HCL ER (SR) 150 MG PO TB12: 150.0000 mg | ORAL_TABLET | Freq: Two times a day (BID) | ORAL | 2 refills | Status: DC

## 2021-07-20 MED ORDER — OMEPRAZOLE MAGNESIUM 20 MG PO TBEC: 20.0000 mg | DELAYED_RELEASE_TABLET | Freq: Every day | ORAL | 0 refills | Status: DC

## 2021-07-20 MED ORDER — OMEPRAZOLE 20 MG PO CPDR: 20.0000 mg | DELAYED_RELEASE_CAPSULE | Freq: Every day | ORAL | 1 refills | Status: DC

## 2021-07-20 NOTE — Progress Notes (Signed)
? ?CC: Follow up erythematous conjunctiva  ? ?HPI: ? ?[name] is a 46 y.o. female living with a history stated below and presents today for follow up of eye redness and chronic medical conditions. Please see problem based assessment and plan for additional details. ? ?Past Medical History:  ?Diagnosis Date  ? Acute deep vein thrombosis of right iliac vein ([facility]) 09/14/2020  ? Stroke [facility])   ? ? ?Current Outpatient Medications on File Prior to Visit  ?Medication Sig Dispense Refill  ? albuterol (PROVENTIL) (2.5 MG/3ML) 0.083% nebulizer solution Take 3 mLs (2.5 mg total) by nebulization every 4 (four) hours as needed for wheezing or shortness of breath. 75 mL 2  ? albuterol (VENTOLIN HFA) 108 (90 Base) MCG/ACT inhaler Inhale 1-2 puffs into the lungs every 6 (six) hours as needed for wheezing or shortness of breath. 8.5 g 3  ? aspirin EC 81 MG tablet Take 1 tablet (81 mg total) by mouth daily. Swallow whole. 30 tablet 11  ? atorvastatin (LIPITOR) 40 MG tablet TAKE 1 TABLET BY MOUTH EVERY DAY 180 tablet 3  ? levothyroxine (SYNTHROID) 150 MCG tablet TAKE 1 TABLET BY MOUTH EVERY DAY 90 tablet 0  ? methocarbamol (ROBAXIN) 500 MG tablet Take 1 tablet (500 mg total) by mouth at bedtime. Do not operate vehicles or heavy machinery within 12 hours of this ingestion. 10 tablet 0  ? nicotine (NICODERM CQ - DOSED IN MG/24 HOURS) 21 mg/24hr patch Place 1 patch (21 mg total) onto the skin daily. 30 patch 1  ? ?No current facility-administered medications on file prior to visit.  ? ? ?No family history on file. ? ?Social History  ? ?Socioeconomic History  ? Marital status: Single  ?  Spouse name: Not on file  ? Number of children: Not on file  ? Years of education: Not on file  ? Highest education level: Not on file  ?Occupational History  ? Not on file  ?Tobacco Use  ? Smoking status: Every Day  ?  Packs/day: 1.00  ?  Types: Cigarettes  ?  Start date: 03/07/2020  ? Smokeless tobacco: Never  ? Tobacco comments:  ?  1 pk per  day   ?Substance and Sexual Activity  ? Alcohol use: Yes  ? Drug use: Not on file  ? Sexual activity: Not on file  ?Other Topics Concern  ? Not on file  ?Social History Narrative  ? Lives with sister  ? Left Handed  ? Drinks 2-3 cups caffeine daily  ? ?Social Determinants of Health  ? ?Financial Resource Strain: Not on file  ?Food Insecurity: Not on file  ?Transportation Needs: Not on file  ?Physical Activity: Not on file  ?Stress: Not on file  ?Social Connections: Not on file  ?Intimate Partner Violence: Not on file  ? ? ?Review of Systems: ?ROS negative except for what is noted on the assessment and plan. ? ?Vitals:  ? 07/20/21 1314  ?BP: (!) 128/96  ?Pulse: 83  ?Temp: 98.1 ?F (36.7 ?C)  ?TempSrc: Oral  ?SpO2: 100%  ?Weight: 201 lb 8 oz (91.4 kg)  ?Height: 5\' 6"  (1.676 m)  ? ?Physical Exam: ?Constitutional: well appearing, no acute distress ?HENT: normocephalic atraumatic, ?Eyes: conjunctiva non-erythematous. PERRL. EOMI. Erythema of the left conjunctiva on the medial aspect of the eye. Non-tender. No matting/draining. No hypopyon. No swelling or increased pressure with palpation.  ?Neck: supple ?Pulmonary/Chest: normal work of breathing on room air ?MSK: normal bulk and tone ?Neurological: alert & oriented x  3 ?Skin: warm and dry ?Psych: normal mood and thought process ? ?Assessment & Plan:  ? ?Tobacco use disorder ?Assessment: ?History of tobacco disorder.  Currently smoking half a pack per day.  Driving factor in her tobacco use is needed on board, since her CVA she has been retired.  She would like to quit smoking, she has nicotine patches at home however she does not use them.  We discussed risk and benefits of medical treatment and she is interested in this.  We will start Wellbutrin 150 mg 1 tablet.  We will start with 1 tablet daily for 3 days and then increase to twice a day if she is able to tolerate. ? ?Plan: ?-Start Wellbutrin 150 mg tablet daily, increase to 2 tablets daily ?-Follow-up at next  visit ? ?GERD (gastroesophageal reflux disease) ?Assessment: ?Patient with history of acid reflux.  In the past she has been for this was discontinued.  She continues to endorse episodes of acid reflux.  We will restart for 6 weeks continuing her outpatient appointments.  Continue improvement will consider further work-up if H. pylori testing and possible GI referral. ? ?Plan: ?-Omeprazole 20 mg daily for 6 weeks ?-Follow-up to see if resolution of GERD ? ?Healthcare maintenance ?Patient would like to do FOBT testing for colon cancer screening.  Discussed if positive she will need colonoscopy, she acknowledges understanding and is ? ?Constipation ?Assessment: ?Endorses episodes of constipation, does not take any medications OTC. Recommended fiber supplements daily and to reach out if no improvement ? ?Plan: ?-OTC fiber supplement daily recommended ?-follow up at next visit ? ?Inflammation of conjunctiva ?Assessment: ?Continues to have erythema of medial aspect of left eye. There is no pain, changes in vision, drainage, itching. Denies foreign body sensation. She denies trauma to the area. During prior evaluation, it was thought to be due to new facial cream she was using. She does not feel as though the redness is worse or improving. She tried using visine without improvement.  ? ?Visual acuity tested today with Snellen Chart. Able to read to line 8, 20/20. On examination, no abnormal extra occular movements. Pupils ERRL.  ? ?Low suspicion for bacterial or viral etiology, no drainage, morning matting, or systemic symptoms of fevers/chills. She does have a history of seasonal allergies which she used to take zyrtec and flonase for. On exam she has have erythematous nasal turbinates, no cobblestoning. Without pain and discomfort, low suspicion for acute glaucoma, both eyes feel similar consistency when she closes her eyes and they are palpated.  ? ?Unclear etiology but with history of allergies, will restart Flonase  and zyrtec. Possible she is still having erythema from her facial cream that has not yet resolved. Will continue to monitor and gave strict return precautions if she has any pain, swelling, drainage or any other new symptoms to call the clinic immediately.  If no resolution can consider referral to ophthalmology. ? ?Plan: ?-continue to monitor ?-if new symptoms, consider referral to ophthalmology  ? ?Patient discussed with Dr. Antony Contras ? ?Thalia Bloodgood, D.O. ?[facility] Internal Medicine, PGY-2 ?Pager: [phone], Phone: [phone] ?Date 07/20/2021 Time 5:05 PM  ?

## 2021-07-20 NOTE — Assessment & Plan Note (Signed)
Assessment: ?Patient with history of acid reflux.  In the past she has been for this was discontinued.  She continues to endorse episodes of acid reflux.  We will restart for 6 weeks continuing her outpatient appointments.  Continue improvement will consider further work-up if H. pylori testing and possible GI referral. ? ?Plan: ?-Omeprazole 20 mg daily for 6 weeks ?-Follow-up to see if resolution of GERD ?

## 2021-07-20 NOTE — Assessment & Plan Note (Signed)
Assessment: ?Continues to have erythema of medial aspect of left eye. There is no pain, changes in vision, drainage, itching. Denies foreign body sensation. She denies trauma to the area. During prior evaluation, it was thought to be due to new facial cream she was using. She does not feel as though the redness is worse or improving. She tried using visine without improvement.  ? ?Visual acuity tested today with Snellen Chart. Able to read to line 8, 20/20. On examination, no abnormal extra occular movements. Pupils ERRL.  ? ?Low suspicion for bacterial or viral etiology, no drainage, morning matting, or systemic symptoms of fevers/chills. She does have a history of seasonal allergies which she used to take zyrtec and flonase for. On exam she has have erythematous nasal turbinates, no cobblestoning. Without pain and discomfort, low suspicion for acute glaucoma, both eyes feel similar consistency when she closes her eyes and they are palpated.  ? ?Unclear etiology but with history of allergies, will restart Flonase and zyrtec. Possible she is still having erythema from her facial cream that has not yet resolved. Will continue to monitor and gave strict return precautions if she has any pain, swelling, drainage or any other new symptoms to call the clinic immediately.  If no resolution can consider referral to ophthalmology. ? ?Plan: ?-continue to monitor ?-if new symptoms, consider referral to ophthalmology  ?

## 2021-07-20 NOTE — Assessment & Plan Note (Signed)
Assessment: ?Endorses episodes of constipation, does not take any medications OTC. Recommended fiber supplements daily and to reach out if no improvement ? ?Plan: ?-OTC fiber supplement daily recommended ?-follow up at next visit ?

## 2021-07-20 NOTE — Assessment & Plan Note (Signed)
Patient would like to do FOBT testing for colon cancer screening.  Discussed if positive she will need colonoscopy, she acknowledges understanding and is ?

## 2021-07-20 NOTE — Patient Instructions (Addendum)
Thank you, [name] for allowing [place] to provide your care today. Today we discussed . ? ?Eye Redness ?I believe this is from your allergies we will restart you on allergy medicine. Please call the clinic if you have pain, worsening redness, discharge or other new symptoms such as fevers or chills.  ? ?Smoking ?We will be starting you on wellbutrin. Please take 1 pill daily. After 3 days you can increase to 2 pills daily. If you have side effects please call the clinic.  ? ?Acid Reflux   ?Please take the omeprazole 1 pill per day for 6 weeks. After the 6 weeks stop taking the pill and we will see how if your reflux improves.  ? ?Constipation ?Please take over the counter metamucil or other fiber supplements daily to see if you have improvement in your constipation.  ? ?Preventative healthcare ?Please pickup stool sample from lab. Please return in 2-3 days after bowel movment. ? ?I have ordered the following labs for you: ? ? ?Lab Orders    ?     Fecal occult blood, imunochemical     ? ? ?Referrals ordered today:  ? ?Referral Orders  ?No referral(s) requested today  ?  ? ?I have ordered the following medication/changed the following medications:  ? ?Stop the following medications: ?There are no discontinued medications.  ? ?Start the following medications: ?Meds ordered this encounter  ?Medications  ? buPROPion (WELLBUTRIN SR) 150 MG 12 hr tablet  ?  Sig: Take 1 tablet (150 mg total) by mouth 2 (two) times daily. Start with 1 tab daily, increase to 2 tabs daily after 3 days if you can tolerate it  ?  Dispense:  90 tablet  ?  Refill:  2  ? fluticasone (FLONASE) 50 MCG/ACT nasal spray  ?  Sig: Place 1 spray into both nostrils daily.  ?  Dispense:  9.9 mL  ?  Refill:  2  ? cetirizine (ZYRTEC ALLERGY) 10 MG tablet  ?  Sig: Take 1 tablet (10 mg total) by mouth daily.  ?  Dispense:  30 tablet  ?  Refill:  2  ? omeprazole (PRILOSEC OTC) 20 MG tablet  ?  Sig: Take 1 tablet (20 mg total) by mouth daily.  ?  Dispense:   42 tablet  ?  Refill:  0  ?  ? ?Follow up: 4-6 months  ? ? ?Should you have any questions or concerns please call the internal medicine clinic at [phone].   ? ?Sanjuana Letters, D.O. ?[place] ?     ?

## 2021-07-20 NOTE — Assessment & Plan Note (Signed)
Assessment: ?History of tobacco disorder.  Currently smoking half a pack per day.  Driving factor in her tobacco use is needed on board, since her CVA she has been retired.  She would like to quit smoking, she has nicotine patches at home however she does not use them.  We discussed risk and benefits of medical treatment and she is interested in this.  We will start Wellbutrin 150 mg 1 tablet.  We will start with 1 tablet daily for 3 days and then increase to twice a day if she is able to tolerate. ? ?Plan: ?-Start Wellbutrin 150 mg tablet daily, increase to 2 tablets daily ?-Follow-up at next visit ?

## 2021-07-28 NOTE — Progress Notes (Signed)
Internal Medicine Clinic Attending  Case discussed with Dr. Katsadouros  At the time of the visit.  We reviewed the resident's history and exam and pertinent patient test results.  I agree with the assessment, diagnosis, and plan of care documented in the resident's note.  

## 2021-08-14 ENCOUNTER — Other Ambulatory Visit: Payer: Self-pay | Admitting: Student

## 2021-08-24 ENCOUNTER — Encounter: Payer: Self-pay | Admitting: Neurology

## 2021-08-24 ENCOUNTER — Encounter: Payer: Self-pay | Admitting: Gastroenterology

## 2021-08-24 ENCOUNTER — Ambulatory Visit: Payer: Medicaid Other | Admitting: Neurology

## 2021-08-24 VITALS — BP 135/84 | HR 83 | Ht 66.0 in | Wt 209.0 lb

## 2021-08-24 DIAGNOSIS — R0681 Apnea, not elsewhere classified: Secondary | ICD-10-CM | POA: Diagnosis not present

## 2021-08-24 DIAGNOSIS — R0683 Snoring: Secondary | ICD-10-CM | POA: Diagnosis not present

## 2021-08-24 DIAGNOSIS — I639 Cerebral infarction, unspecified: Secondary | ICD-10-CM | POA: Diagnosis not present

## 2021-08-24 DIAGNOSIS — E669 Obesity, unspecified: Secondary | ICD-10-CM | POA: Diagnosis not present

## 2021-08-24 DIAGNOSIS — G4719 Other hypersomnia: Secondary | ICD-10-CM

## 2021-08-24 DIAGNOSIS — E66811 Obesity, class 1: Secondary | ICD-10-CM

## 2021-08-24 NOTE — Progress Notes (Signed)
Subjective:  ?  ?Patient ID: Maria Porter is a 46 y.o. female. ? ?HPI ? ? ? ?Huston FoleySaima Tamla Winkels, MD, PhD ?[facility] ?[address], [address] ?[phone] ?[place], [place] [id number] ? ?Dear Drs. Katsadouros and KanabVincent,  ? ?I saw your patient, Maria Porter, upon your kind request, in my sleep clinic today for initial consultation of her sleep disorder, in particular, Concern for underlying obstructive sleep apnea.  The patient is unaccompanied today.  As you know, Maria Porter is a 46 year old right-handed woman with an underlying medical history of stroke, DVT, hypothyroidism, hyperlipidemia, smoking, reflux disease, constipation, and obesity, who reports snoring and excessive daytime somnolence.  I reviewed your office note from 03/07/2021, as well as 07/20/2021.  Her Epworth sleepiness score is 13/24, fatigue severity score is 33 out of 63.  She is single and lives alone, she has 4 grown children, they live in [place].  She has been noted to snore and also reports that she has been told by her son that she has pauses in her breathing.  She does not wake up rested and has trouble initiating and maintaining sleep.  She tries to be in bed around 10 PM but may not be asleep until 1 or 2 AM.  She has a TV in her bedroom on at night but puts it on a sleep timer.  Rise time is around 7 AM.  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  She has no night to night nocturia or recurrent morning headaches.  She reports that she was told she may have sleep apnea when she was in the hospital with her stroke and she was evaluated for sleep apnea during her hospitalization with a study.  She drinks caffeine in the form of coffee, 1 cup in the morning and 2 cans of soda per day.  She does not drink any alcohol.  She is trying to quit smoking and currently smokes about half a pack per day.  She reports that the Wellbutrin has been helpful.  She has no pets in the  household.  She has no obvious family history of sleep apnea.  She would be willing to try CPAP therapy. She is followed by Dr. Pearlean BrownieSethi for her stroke.  ? ?Her Past Medical History Is Significant For: ?Past Medical History:  ?Diagnosis Date  ? Acute deep vein thrombosis of right iliac vein (HCC) 09/14/2020  ? Stroke [facility](HCC)   ? ? ?Her Past Surgical History Is Significant For: ?Past Surgical History:  ?Procedure Laterality Date  ? BUBBLE STUDY  01/29/2020  ? Procedure: BUBBLE STUDY;  Surgeon: Little IshikawaSchumann, Christopher L, MD;  [facility];  Service: Cardiovascular;;  ? TEE WITHOUT CARDIOVERSION N/A 01/29/2020  ? Procedure: TRANSESOPHAGEAL ECHOCARDIOGRAM (TEE);  Surgeon: Little IshikawaSchumann, Christopher L, MD;  [facility];  Service: Cardiovascular;  Laterality: N/A;  ? ? ?Her Family History Is Significant For: ?Family History  ?Problem Relation Age of Onset  ? Sleep apnea Neg Hx   ? ? ?Her Social History Is Significant For: ?Social History  ? ?Socioeconomic History  ? Marital status: Single  ?  Spouse name: Not on file  ? Number of children: Not on file  ? Years of education: Not on file  ? Highest education level: Not on file  ?Occupational History  ? Not on file  ?Tobacco Use  ? Smoking status: Every Day  ?  Packs/day: 0.50  ?  Types: Cigarettes  ?  Start date: 03/07/2020  ? Smokeless tobacco: Never  ? Tobacco comments:  ?  1 pk per day   ?Substance and Sexual Activity  ? Alcohol use: Yes  ? Drug use: Not on file  ? Sexual activity: Not on file  ?Other Topics Concern  ? Not on file  ?Social History Narrative  ? Lives with sister  ? Left Handed  ? Drinks 2-3 cups caffeine daily  ? ?Social Determinants of Health  ? ?Financial Resource Strain: Not on file  ?Food Insecurity: Not on file  ?Transportation Needs: Not on file  ?Physical Activity: Not on file  ?Stress: Not on file  ?Social Connections: Not on file  ? ? ?Her Allergies Are:  ?No Known Allergies:  ? ?Her Current Medications Are:  ?Outpatient Encounter  Medications as of 08/24/2021  ?Medication Sig  ? albuterol (PROVENTIL) (2.5 MG/3ML) 0.083% nebulizer solution Take 3 mLs (2.5 mg total) by nebulization every 4 (four) hours as needed for wheezing or shortness of breath.  ? albuterol (VENTOLIN HFA) 108 (90 Base) MCG/ACT inhaler Inhale 1-2 puffs into the lungs every 6 (six) hours as needed for wheezing or shortness of breath.  ? aspirin EC 81 MG tablet Take 1 tablet (81 mg total) by mouth daily. Swallow whole.  ? atorvastatin (LIPITOR) 40 MG tablet TAKE 1 TABLET BY MOUTH EVERY DAY  ? buPROPion (WELLBUTRIN SR) 150 MG 12 hr tablet TAKE 1 TABLET (150 MG TOTAL) BY MOUTH 2 (TWO) TIMES DAILY. START WITH 1 TAB DAILY, INCREASE TO 2 TABS DAILY AFTER 3 DAYS IF YOU CAN TOLERATE IT  ? cetirizine (ZYRTEC ALLERGY) 10 MG tablet Take 1 tablet (10 mg total) by mouth daily.  ? fluticasone (FLONASE) 50 MCG/ACT nasal spray Place 1 spray into both nostrils daily.  ? levothyroxine (SYNTHROID) 150 MCG tablet TAKE 1 TABLET BY MOUTH EVERY DAY  ? methocarbamol (ROBAXIN) 500 MG tablet Take 1 tablet (500 mg total) by mouth at bedtime. Do not operate vehicles or heavy machinery within 12 hours of this ingestion.  ? nicotine (NICODERM CQ - DOSED IN MG/24 HOURS) 21 mg/24hr patch Place 1 patch (21 mg total) onto the skin daily.  ? omeprazole (PRILOSEC) 20 MG capsule Take 1 capsule (20 mg total) by mouth daily.  ? ?No facility-administered encounter medications on file as of 08/24/2021.  ?: ? ? ?Review of Systems:  ?Out of a complete 14 point review of systems, all are reviewed and negative with the exception of these symptoms as listed below: ? ? ?Review of Systems  ?Neurological:   ?     Pt is here for sleep consult  Pt states she snores,have fatigue,headaches. Pt denies hypertension, sleep study,CPAP machine Pt states when she was in hospital for her stroke Dr  Pearlean Brownie did a 24 hour sleep study. Pt states she did not qualify for CPAP machine  ? ?ESS:13 ?FSS:33  ? ?Objective:  ?Neurological  Exam ? ?Physical Exam ?Physical Examination:  ? ?Vitals:  ? 08/24/21 1118  ?BP: 135/84  ?Pulse: 83  ? ? ?General Examination: The patient is a very pleasant 46 y.o. female in no acute distress. She appears well-developed and well-nourished and well groomed.  ? ?HEENT: Normocephalic, atraumatic, pupils are equal, round and reactive to light, extraocular tracking is good without limitation to gaze excursion or nystagmus noted. Hearing is grossly intact. Face is symmetric with normal facial animation. Speech with dysarthria.  She has no voice tremor.  Neck is supple, full range  of motion, no carotid bruits.  Airway examination reveals multiple missing teeth and loose right upper front tooth, moderate airway crowding secondary to small airway entry, elongated tongue, tonsillar size of about 1+ bilaterally, left side a little bigger than right.  Neck circumference of 14 three-quarter inches.  Tongue protrudes centrally and palate elevates symmetrically.   ? ?Chest: Clear to auscultation without wheezing, rhonchi or crackles noted. ? ?Heart: S1+S2+0, regular and normal without murmurs, rubs or gallops noted.  ? ?Abdomen: Soft, non-tender and non-distended with normal bowel sounds appreciated on auscultation. ? ?Extremities: There is no pitting edema in the distal lower extremities bilaterally.  ? ?Skin: Warm and dry without trophic changes noted.  ? ?Musculoskeletal: exam reveals no obvious joint deformities, tenderness or joint swelling or erythema.  ? ?Neurologically:  ?Mental status: The patient is awake, alert and oriented in all 4 spheres. Her immediate and remote memory, attention, language skills and fund of knowledge are appropriate. There is no evidence of aphasia, agnosia, apraxia or anomia. Speech is clear with normal prosody and enunciation. Thought process is linear. Mood is normal and affect is normal.  ?Cranial nerves II - XII are as described above under HEENT exam.  ?Motor exam: Normal bulk, global  strength of 4 out of 5, mild weakness on the right side, no obvious tremor.  Fine motor skills are mildly impaired globally.  Cerebellar testing shows no dysmetria or intention tremor.  She has no obvious truncal or gait ataxia.  Renette Butters

## 2021-08-24 NOTE — Patient Instructions (Signed)

## 2021-09-02 DIAGNOSIS — R42 Dizziness and giddiness: Secondary | ICD-10-CM | POA: Diagnosis not present

## 2021-09-02 DIAGNOSIS — Z8673 Personal history of transient ischemic attack (TIA), and cerebral infarction without residual deficits: Secondary | ICD-10-CM | POA: Diagnosis not present

## 2021-09-02 DIAGNOSIS — R2991 Unspecified symptoms and signs involving the musculoskeletal system: Secondary | ICD-10-CM | POA: Diagnosis not present

## 2021-09-07 DIAGNOSIS — I639 Cerebral infarction, unspecified: Secondary | ICD-10-CM | POA: Diagnosis not present

## 2021-09-08 DIAGNOSIS — I639 Cerebral infarction, unspecified: Secondary | ICD-10-CM | POA: Diagnosis not present

## 2021-09-11 DIAGNOSIS — I639 Cerebral infarction, unspecified: Secondary | ICD-10-CM | POA: Diagnosis not present

## 2021-09-12 DIAGNOSIS — I639 Cerebral infarction, unspecified: Secondary | ICD-10-CM | POA: Diagnosis not present

## 2021-09-13 DIAGNOSIS — I639 Cerebral infarction, unspecified: Secondary | ICD-10-CM | POA: Diagnosis not present

## 2021-09-14 DIAGNOSIS — I639 Cerebral infarction, unspecified: Secondary | ICD-10-CM | POA: Diagnosis not present

## 2021-09-15 DIAGNOSIS — I639 Cerebral infarction, unspecified: Secondary | ICD-10-CM | POA: Diagnosis not present

## 2021-09-18 ENCOUNTER — Telehealth: Payer: Self-pay | Admitting: *Deleted

## 2021-09-18 DIAGNOSIS — I639 Cerebral infarction, unspecified: Secondary | ICD-10-CM | POA: Diagnosis not present

## 2021-09-18 NOTE — Telephone Encounter (Signed)
Was unable to reach pt. Spoke with daughter Bennie Hind # [phone] and she informed me that her mother had a stroke 3 weeks ago and currently is in rehab,she stated "she goes back to the doctor on the 26 th,procedure scheduled for 10/09/21 and pre-visit cancelled,they will call back when ready to reschedule procedure. ?

## 2021-09-18 NOTE — Telephone Encounter (Signed)
Agreed. Thank you for the update.  

## 2021-09-19 ENCOUNTER — Other Ambulatory Visit: Payer: Self-pay | Admitting: Student

## 2021-09-19 DIAGNOSIS — R531 Weakness: Secondary | ICD-10-CM | POA: Diagnosis not present

## 2021-09-19 DIAGNOSIS — I63541 Cerebral infarction due to unspecified occlusion or stenosis of right cerebellar artery: Secondary | ICD-10-CM | POA: Diagnosis not present

## 2021-09-19 DIAGNOSIS — Z789 Other specified health status: Secondary | ICD-10-CM | POA: Diagnosis not present

## 2021-09-19 DIAGNOSIS — E039 Hypothyroidism, unspecified: Secondary | ICD-10-CM

## 2021-09-19 DIAGNOSIS — I639 Cerebral infarction, unspecified: Secondary | ICD-10-CM | POA: Diagnosis not present

## 2021-09-20 ENCOUNTER — Telehealth: Payer: Self-pay

## 2021-09-20 NOTE — Telephone Encounter (Signed)
Transition Care Management Follow-up Telephone Call ?Date of discharge and from where: 09/19/2021 from [facility] ?How have you been since you were released from the hospital? Patient stated that she is feeling well and has a lot of support. Patient has all of the DME ordered at home. Patient did not have any questions or concerns at this time.  ?Any questions or concerns? No ? ?Items Reviewed: ?Did the pt receive and understand the discharge instructions provided? Yes  ?Medications obtained and verified? Yes  ?Other? No  ?Any new allergies since your discharge? No  ?Dietary orders reviewed? No ?Do you have support at home? Yes  ? ?Home Care and Equipment/Supplies: ?Were home health services ordered? no ?If so, what is the name of the agency? N/A  ?Has the agency set up a time to come to the patient's home? yes ?Were any new equipment or medical supplies ordered?  Yes: walker, bedside commode, shower chair. ? ?Were you able to get the supplies/equipment? yes ?Do you have any questions related to the use of the equipment or supplies? No ? ?Functional Questionnaire: (I = Independent and D = Dependent) ?ADLs: D ? ?Bathing/Dressing- I ? ?Meal Prep- D ? ?Eating- I ? ?Maintaining continence- I ? ?Transferring/Ambulation- I with walker ? ?Managing Meds- D ? ?Follow up appointments reviewed: ? ?PCP Hospital f/u appt confirmed? Yes  Scheduled to see Dellis Filbert, MD on 09/29/2021 @ 10:15 am. ?Specialist Hospital f/u appt confirmed? Yes  Scheduled to see Ihor Austin, NP on 11/14/2021 @ 1:45 pm. ?Are transportation arrangements needed? No  ?If their condition worsens, is the pt aware to call PCP or go to the Emergency Dept.? Yes ?Was the patient provided with contact information for the PCP's office or ED? Yes ?Was to pt encouraged to call back with questions or concerns? Yes ? ?

## 2021-09-21 ENCOUNTER — Telehealth: Payer: Self-pay

## 2021-09-21 NOTE — Telephone Encounter (Signed)
Called pt to schedule her sleep study but pt states she will call back at a better time

## 2021-09-29 ENCOUNTER — Other Ambulatory Visit: Payer: Self-pay

## 2021-09-29 ENCOUNTER — Ambulatory Visit (INDEPENDENT_AMBULATORY_CARE_PROVIDER_SITE_OTHER): Payer: Medicaid Other | Admitting: Internal Medicine

## 2021-09-29 ENCOUNTER — Encounter: Payer: Self-pay | Admitting: Internal Medicine

## 2021-09-29 VITALS — BP 119/83 | HR 91 | Temp 98.3°F | Resp 24 | Ht 66.0 in | Wt 208.1 lb

## 2021-09-29 DIAGNOSIS — E7800 Pure hypercholesterolemia, unspecified: Secondary | ICD-10-CM | POA: Diagnosis not present

## 2021-09-29 DIAGNOSIS — M25511 Pain in right shoulder: Secondary | ICD-10-CM

## 2021-09-29 DIAGNOSIS — F17211 Nicotine dependence, cigarettes, in remission: Secondary | ICD-10-CM

## 2021-09-29 DIAGNOSIS — I6930 Unspecified sequelae of cerebral infarction: Secondary | ICD-10-CM

## 2021-09-29 DIAGNOSIS — F17200 Nicotine dependence, unspecified, uncomplicated: Secondary | ICD-10-CM

## 2021-09-29 DIAGNOSIS — G8929 Other chronic pain: Secondary | ICD-10-CM

## 2021-09-29 MED ORDER — BUPROPION HCL ER (XL) 150 MG PO TB24: 150.0000 mg | ORAL_TABLET | ORAL | 1 refills | Status: DC

## 2021-09-29 MED ORDER — ATORVASTATIN CALCIUM 80 MG PO TABS: 80.0000 mg | ORAL_TABLET | Freq: Every day | ORAL | 1 refills | Status: DC

## 2021-09-29 MED ORDER — APIXABAN 5 MG PO TABS: 5.0000 mg | ORAL_TABLET | Freq: Two times a day (BID) | ORAL | 1 refills | Status: DC

## 2021-09-29 NOTE — Assessment & Plan Note (Signed)
She has undergone cessation--tobacco free x30d. Pt congratulated.  -Refilled bupropion

## 2021-09-29 NOTE — Assessment & Plan Note (Addendum)
Patient with history of left PICA vascular territory, left medial cerebellar, left frontal/subcortical strokes, left MCA occlusion with residual right-sided hemiparesis  She is presenting for hospital follow-up today after being admitted to [facility] in [place] last month after experiencing another stroke.  She had presented there on 4/29 for evaluation of acute onset left-sided weakness and slurring of speech.  Head CT revealed an acute right occipital infarct, brain MRI revealed a large acute/subacute right cerebellar infarct, CTA with occlusion to the right superior cerebellar artery.  She underwent TTE which did not reveal a thrombus.  She underwent a loop recorder placement prior to discharge.  States she was discharged on 5/4 to inpatient rehab and was discharged from there on 5/16.  Since then, she has been residing with her daughter in [place].  She additionally reports to me today that she had been experiencing diplopia in her left eye at the time of this most recent stroke that is since resolved.  She is still continuing to struggle with her speech and ambulation however she has been living with her daughter who has been helping.  She has had no reoccurrence of stroke symptoms since April.   The majority of today's visit was spent on discussion regarding etiology of her recurrent strokes and plans moving forward.  She did have a prior hypercoagulable work-up in 2021 which was unrevealing.  According to her daughter and her, she did have each of the strokes after being taken off of anticoagulation which she had originally been put on after a DVT.  The records that are available in the chart including care everywhere is not quite congruent with this however they are able to specifically state the timeframe so I am guessing that there may be some records that are not readily available to support this. The patient also mentions that she was told that she had A-fib at the time of admission for her second  stroke which occurred at an outside facility, prior to changing her care to here. She notes that she did not communicate this when she moved her care to here.  Since then, she has not been detected to be in atrial fibrillation.  She did mention this to her providers at [facility] who recommended the placement of a loop recorder for evaluation.  Assessment: Suspect she does have some kind of underlying pro-embolic state, whether that be hypercoagulable or PAF. She and her daughter feel strongly that she should get back on eliquis, which I agree with. In addition to subcortical infarcts, her head imagine has also revealed cortical infarcts, raising suspicion for embolic source. Hx of M1 1 (LVO) occlusion and unprovoked DVT, especially in someone her age, further supports a hypercoagulable state. I do not feel that repeating a hypercoagulable panel will be useful at this point as she likely needs lifelong anticoagulation.   Plan -Restart eliquis 5mg  daily. Risks reviewed and acknowledged by pt and agrees to proceed -She has an appointment with Dr. Clydene Fake NP scheduled for July -Referral to cardiology placed for loop corder monitoring -Given her hypercoaguable state, I have also placed a referral to hematology  -No HH orders needed per pt -Blood pressure is well controlled today and LDL is near goal at 76. Continue statin -She has undergone smoking cessation with the help of bupropion--tobacco free for 30d -UDS from [facility] admission +cocaine, encourage cessation -She has prediabetes--A1C 5.8 in April.

## 2021-09-29 NOTE — Progress Notes (Signed)
Office Visit   Patient ID: Maria Porter, female    DOB: [birth year], 46 y.o.   MRN: [id number]   PCP: Belva Agee, MD   Subjective:  CC: Follow-up, Medication Refill, Medication Problem (Wants to get back on Elliquis ), and Referral (Wants Heart Doctor and Neurology)   Maria Porter is a 46 y.o. year old female who presents for the above medical condition(s). Please refer to problem based charting for assessment and plan.  Objective:   BP 119/83 (BP Location: Left Arm, Patient Position: Sitting, Cuff Size: Large)   Pulse 91   Temp 98.3 F (36.8 C) (Oral)   Resp (!) 24   Ht 5\' 6"  (1.676 m)   Wt 208 lb 1.6 oz (94.4 kg)   SpO2 100% Comment: room air  BMI 33.59 kg/m   General: Well-appearing female in no distress Cardiac: Heart regular rate and rhythm Pulm: Lungs clear throughout MSK: Right upper extremity adducted and flexed.  No shoulder pain on palpation.  Range of motion significantly limited due to pain  Neurologic exam Speech: Dysarthric Mental status: Alert and oriented x4 Cranial nerves: Cranial nerves III through XII intact.  Peripheral vision intact. Motor: 5 out of 5 strength in the bilateral upper extremities.  5 out of 5 strength in the bilateral lower extremities although right side is notably weaker than the left.  No tremor Gait: Deferred in the absence of her walker.  I did have her stand up and take a couple of steps.  She definitely requires assist of 1 and has a fairly unsteady gait from what I can see.  Trendelenburg. Assessment & Plan:   Problem List Items Addressed This Visit       Other   History of CVA with residual deficit - Primary    Patient with history of left PICA vascular territory, left medial cerebellar, left frontal/subcortical strokes, left MCA occlusion with residual right-sided hemiparesis  She is presenting for hospital follow-up today after being admitted to [facility] in [facility] last month after experiencing another stroke.   She had presented there on 4/29 for evaluation of acute onset left-sided weakness and slurring of speech.  Head CT revealed an acute right occipital infarct, brain MRI revealed a large acute/subacute right cerebellar infarct, CTA with occlusion to the right superior cerebellar artery.  She underwent TTE which did not reveal a thrombus.  She underwent a loop recorder placement prior to discharge.  States she was discharged on 5/4 to inpatient rehab and was discharged from there on 5/16.  Since then, she has been residing with her daughter in 6/16.  She additionally reports to me today that she had been experiencing diplopia in her left eye at the time of this most recent stroke that is since resolved.  She is still continuing to struggle with her speech and ambulation however she has been living with her daughter who has been helping.  She has had no reoccurrence of stroke symptoms since April.   The majority of today's visit was spent on discussion regarding etiology of her recurrent strokes and plans moving forward.  She did have a prior hypercoagulable work-up in 2021 which was unrevealing.  According to her daughter and her, she did have each of the strokes after being taken off of anticoagulation which she had originally been put on after a DVT.  The records that are available in the chart including care everywhere is not quite congruent with this however they are able to specifically state the timeframe  so I am guessing that there may be some records that are not readily available to support this. The patient also mentions that she was told that she had A-fib at the time of admission for her second stroke which occurred at an outside facility, prior to changing her care to here. She notes that she did not communicate this when she moved her care to here.  Since then, she has not been detected to be in atrial fibrillation.  She did mention this to her providers at [facility] who recommended the placement  of a loop recorder for evaluation.  Assessment: Suspect she does have some kind of underlying pro-embolic state, whether that be hypercoagulable or PAF. She and her daughter feel strongly that she should get back on eliquis, which I agree with. In addition to subcortical infarcts, her head imagine has also revealed cortical infarcts, raising suspicion for embolic source. Hx of M1 1 (LVO) occlusion and unprovoked DVT, especially in someone her age, further supports a hypercoagulable state. I do not feel that repeating a hypercoagulable panel will be useful at this point as she likely needs lifelong anticoagulation.   Plan -Restart eliquis 5mg  daily. Risks reviewed and acknowledged by pt and agrees to proceed -She has an appointment with Dr. NP scheduled for July -Referral to cardiology placed for loop corder monitoring -Given her hypercoaguable state, I have also placed a referral to hematology  -No HH orders needed per pt -Blood pressure is well controlled today and LDL is near goal at 76. Continue statin -She has undergone smoking cessation with the help of bupropion--tobacco free for 30d -UDS from [facility] admission +cocaine, encourage cessation -She has prediabetes--A1C 5.8 in April.          Relevant Orders   Ambulatory referral to Cardiology   Ambulatory referral to Hematology / Oncology   Tobacco use disorder    She has undergone cessation--tobacco free x30d. Pt congratulated.  -Refilled bupropion       Hyperlipemia   Relevant Medications   apixaban (ELIQUIS) 5 MG TABS tablet   atorvastatin (LIPITOR) 80 MG tablet   Right shoulder pain    She continues to experience significant pain in her right shoulder. She feels that it has worsened since this most recent stroke. Her arm is in an adducted and flexed position and is difficult to move due to significant pain and stiffness. No known interm injury to it.  Suspect she has developed adhesive capsulitis secondary to an  underlying tendonitis. Doubt that PT alone could resolve it, would likely need orthopedics involved. She may benefit from an intraarticular steroid injection to reduce some of the inflammation and allow her to work on ROM with therapy. Due to time limitations at her visit today, I encouraged her to return for further evaluation and management at her earliest convenience.         Follow up with Neurology in July  Return in about 3 months (around 12/30/2021). -ensure she has followed up with cardiology for loop recorder monitoring -ensure she has followed up with hematology for hypercoagulable state with recurrent CVAs -re-evaluate right shoulder pain. Consider intra-articular steroid injection    Pt discussed with Dr. 01/01/2022, MD Internal Medicine Resident PGY-3 [facility] 09/29/2021 6:28 PM

## 2021-09-29 NOTE — Assessment & Plan Note (Signed)
She continues to experience significant pain in her right shoulder. She feels that it has worsened since this most recent stroke. Her arm is in an adducted and flexed position and is difficult to move due to significant pain and stiffness. No known interm injury to it.  Suspect she has developed adhesive capsulitis secondary to an underlying tendonitis. Doubt that PT alone could resolve it, would likely need orthopedics involved. She may benefit from an intraarticular steroid injection to reduce some of the inflammation and allow her to work on ROM with therapy. Due to time limitations at her visit today, I encouraged her to return for further evaluation and management at her earliest convenience.

## 2021-10-04 NOTE — Telephone Encounter (Signed)
Spoke with the patient she asked me to call her daughter Sophronia Simas. I spoke with her daughter the patient is scheduled at [facility] for 10/23/21 at 9pm. I also sent the daughter a email about the appointment.

## 2021-10-06 NOTE — Progress Notes (Signed)
Internal Medicine Clinic Attending  Case discussed with Dr. Christian  At the time of the visit.  We reviewed the resident's history and exam and pertinent patient test results.  I agree with the assessment, diagnosis, and plan of care documented in the resident's note.  

## 2021-10-09 ENCOUNTER — Encounter: Payer: Medicaid Other | Admitting: Gastroenterology

## 2021-10-18 NOTE — Telephone Encounter (Signed)
Pt called stating that she was informed to r/s her sleep appt Sunday night. Please call pt back to confirm.

## 2021-10-19 NOTE — Telephone Encounter (Signed)
I called the patient and left a voicemail and informed her I am not sure who informed her to r/s because ewe don't need to r/s the appointment unless she needs to. I informed her that we still have her on the schedule for Sunday to be here at 9 pm, unless she is the one that needs to r/s to give me a call back and I left my direct number.

## 2021-10-22 ENCOUNTER — Ambulatory Visit (INDEPENDENT_AMBULATORY_CARE_PROVIDER_SITE_OTHER): Payer: Medicaid Other | Admitting: Neurology

## 2021-10-22 DIAGNOSIS — R0683 Snoring: Secondary | ICD-10-CM

## 2021-10-22 DIAGNOSIS — R0681 Apnea, not elsewhere classified: Secondary | ICD-10-CM

## 2021-10-22 DIAGNOSIS — G4720 Circadian rhythm sleep disorder, unspecified type: Secondary | ICD-10-CM

## 2021-10-22 DIAGNOSIS — G4719 Other hypersomnia: Secondary | ICD-10-CM

## 2021-10-22 DIAGNOSIS — E669 Obesity, unspecified: Secondary | ICD-10-CM

## 2021-10-22 DIAGNOSIS — I639 Cerebral infarction, unspecified: Secondary | ICD-10-CM

## 2021-10-30 ENCOUNTER — Telehealth: Payer: Self-pay

## 2021-11-14 ENCOUNTER — Ambulatory Visit (INDEPENDENT_AMBULATORY_CARE_PROVIDER_SITE_OTHER): Payer: Medicaid Other | Admitting: Student

## 2021-11-14 ENCOUNTER — Ambulatory Visit: Payer: Medicaid Other | Admitting: Internal Medicine

## 2021-11-14 ENCOUNTER — Other Ambulatory Visit: Payer: Self-pay

## 2021-11-14 ENCOUNTER — Encounter: Payer: Self-pay | Admitting: Student

## 2021-11-14 ENCOUNTER — Encounter: Payer: Self-pay | Admitting: Adult Health

## 2021-11-14 ENCOUNTER — Encounter: Payer: Self-pay | Admitting: Internal Medicine

## 2021-11-14 ENCOUNTER — Ambulatory Visit (INDEPENDENT_AMBULATORY_CARE_PROVIDER_SITE_OTHER): Payer: Medicaid Other | Admitting: Internal Medicine

## 2021-11-14 ENCOUNTER — Ambulatory Visit: Payer: Medicaid Other | Admitting: Adult Health

## 2021-11-14 VITALS — BP 122/76 | HR 63 | Ht 66.0 in | Wt 221.6 lb

## 2021-11-14 VITALS — BP 124/81 | HR 70 | Ht 66.0 in | Wt 218.4 lb

## 2021-11-14 VITALS — BP 123/76 | HR 86 | Temp 98.3°F | Ht 66.0 in | Wt 217.9 lb

## 2021-11-14 DIAGNOSIS — I639 Cerebral infarction, unspecified: Secondary | ICD-10-CM

## 2021-11-14 DIAGNOSIS — I6930 Unspecified sequelae of cerebral infarction: Secondary | ICD-10-CM | POA: Diagnosis not present

## 2021-11-14 DIAGNOSIS — Z09 Encounter for follow-up examination after completed treatment for conditions other than malignant neoplasm: Secondary | ICD-10-CM

## 2021-11-14 DIAGNOSIS — Z87891 Personal history of nicotine dependence: Secondary | ICD-10-CM | POA: Diagnosis not present

## 2021-11-14 DIAGNOSIS — R7303 Prediabetes: Secondary | ICD-10-CM

## 2021-11-14 DIAGNOSIS — Z95818 Presence of other cardiac implants and grafts: Secondary | ICD-10-CM | POA: Diagnosis not present

## 2021-11-14 DIAGNOSIS — E039 Hypothyroidism, unspecified: Secondary | ICD-10-CM | POA: Diagnosis not present

## 2021-11-14 DIAGNOSIS — E7800 Pure hypercholesterolemia, unspecified: Secondary | ICD-10-CM | POA: Diagnosis not present

## 2021-11-14 DIAGNOSIS — I82403 Acute embolism and thrombosis of unspecified deep veins of lower extremity, bilateral: Secondary | ICD-10-CM

## 2021-11-14 MED ORDER — ONDANSETRON HCL 4 MG PO TABS: 4.0000 mg | ORAL_TABLET | Freq: Three times a day (TID) | ORAL | 0 refills | Status: DC | PRN

## 2021-11-14 MED ORDER — PANTOPRAZOLE SODIUM 40 MG PO TBEC: 40.0000 mg | DELAYED_RELEASE_TABLET | Freq: Every day | ORAL | 1 refills | Status: DC

## 2021-11-14 MED ORDER — ATORVASTATIN CALCIUM 80 MG PO TABS: 80.0000 mg | ORAL_TABLET | Freq: Every day | ORAL | 1 refills | Status: DC

## 2021-11-14 MED ORDER — EZETIMIBE 10 MG PO TABS: 10.0000 mg | ORAL_TABLET | Freq: Every day | ORAL | 3 refills | Status: DC

## 2021-11-14 MED ORDER — LEVOTHYROXINE SODIUM 150 MCG PO TABS: 150.0000 ug | ORAL_TABLET | Freq: Every day | ORAL | 2 refills | Status: DC

## 2021-11-14 NOTE — Progress Notes (Signed)
Cardiology Office Note:    Date:  11/14/2021   ID:  Maria Porter, DOB 09/05/1975, MRN [id number]  PCP:  Belva Agee, MD   [facility] Providers Cardiologist:  None     Referring MD: Belva Agee, *   No chief complaint on file. CVA  History of Present Illness:    Maria Porter is a 46 y.o. female with a hx of CVA left PICA vascular territory, left medial cerebellar, left frontal/subcortical strokes, left MCA occlusion with residual right-sided hemiparesis 01/27/2020 (TEE at that time showed + PFO but too small for repair) . She has hx of DVT on eliquis, smoking as well. In May, of this year patient was found to have acute right occipital infarct, brain MRI revealed a large acute/subacute right cerebellar infarct, CTA with occlusion to the right superior cerebellar artery. She was admitted to [facility] in [place].  She's had prior negative hypercoag studies. She had ILR placed in early May and referral was sent for FU.  Today, she denies palpitations. Her blood pressure is well controlled today.  She denies chest pain. She has asthma. She's on aspirin and eliquis. She is pending LE [place] for DVT. She has noted prior cocaine use. She has hx of negative sleep study.  Past Medical History:  Diagnosis Date   Acute deep vein thrombosis of right iliac vein (HCC) 09/14/2020   Stroke [facility])     Past Surgical History:  Procedure Laterality Date   BUBBLE STUDY  01/29/2020   Procedure: BUBBLE STUDY;  Surgeon: Little Ishikawa, MD;  Location: [facility] ENDOSCOPY;  Service: Cardiovascular;;   TEE WITHOUT CARDIOVERSION N/A 01/29/2020   Procedure: TRANSESOPHAGEAL ECHOCARDIOGRAM (TEE);  Surgeon: Little Ishikawa, MD;  Location: [facility] ENDOSCOPY;  Service: Cardiovascular;  Laterality: N/A;    Current Medications: No outpatient medications have been marked as taking for the 11/14/21 encounter (Appointment) with Maisie Fus, MD.     Allergies:   Patient has no known  allergies.   Social History   Socioeconomic History   Marital status: Single    Spouse name: Not on file   Number of children: Not on file   Years of education: Not on file   Highest education level: Not on file  Occupational History   Not on file  Tobacco Use   Smoking status: Former    Packs/day: 0.50    Types: Cigarettes    Start date: 03/07/2020    Quit date: 08/30/2021    Years since quitting: 0.2   Smokeless tobacco: Never   Tobacco comments:    1 pk per day.   Stopped x 1 month age  Substance and Sexual Activity   Alcohol use: Yes   Drug use: Not on file   Sexual activity: Not on file  Other Topics Concern   Not on file  Social History Narrative   Lives with sister   Left Handed   Drinks 2-3 cups caffeine daily   Social Determinants of Health   Financial Resource Strain: Not on file  Food Insecurity: Not on file  Transportation Needs: Not on file  Physical Activity: Not on file  Stress: Not on file  Social Connections: Not on file     Family History: Mother and father with hx of heart disease as well as her maternal side  ROS:   Please see the history of present illness.     All other systems reviewed and are negative.  EKGs/Labs/Other Studies Reviewed:    The following studies  were reviewed today:   EKG:  EKG is  ordered today.  The ekg ordered today demonstrates   11/14/2021- NSR, inferior   Recent Labs: 05/23/2021: BUN 9; Creatinine, Ser 0.81; Potassium 4.4; Sodium 141  Recent Lipid Panel    Component Value Date/Time   CHOL 181 01/27/2020 0500   TRIG 69 01/27/2020 0500   HDL 42 01/27/2020 0500   CHOLHDL 4.3 01/27/2020 0500   VLDL 14 01/27/2020 0500   LDLCALC 125 (H) 01/27/2020 0500     Risk Assessment/Calculations:           Physical Exam:    VS:   Vitals:   11/14/21 1455  BP: 122/76  Pulse: 63     Wt Readings from Last 3 Encounters:  09/29/21 208 lb 1.6 oz (94.4 kg)  08/24/21 209 lb (94.8 kg)  07/20/21 201 lb 8 oz  (91.4 kg)     GEN:  Well nourished, well developed in no acute distress HEENT: Normal NECK: No JVD; No carotid bruits LYMPHATICS: No lymphadenopathy CARDIAC: RRR, no murmurs, rubs, gallops RESPIRATORY:  Clear to auscultation without rales, wheezing or rhonchi  ABDOMEN: Soft, non-tender, non-distended MUSCULOSKELETAL:  RUE weakness SKIN: Warm and dry NEUROLOGIC:  Alert and oriented x 3; aphasia PSYCHIATRIC:  Normal affect   ASSESSMENT:    ILR: attempted the clinic medtronic programmer which was not working. Will plan for referral for ILR interrogation to assess for AFIB.  Stroke: c/f cardioembolic. No source was identified. No LAA or LV thrombus. Her EF is normal. She had a small PFO not amenable to closure.. She has no evidence of afib at this time. Continue to encourage abstinence from cocaine. She's on asa and eliquis. Dc'd eliquis prior and had stroke on aspirin. Blood pressures is in good control. Managed by neurology  HLD: hx of multiple CVAs. Goal < 70 mg /dL. LDL 75 mg /dL 2/44/0102, will add zetia to lipitor 80 mg daily    PLAN:    In order of problems listed above:  EP referral/device clinic for ILR Follow up in 3 months      Medication Adjustments/Labs and Tests Ordered: Current medicines are reviewed at length with the patient today.  Concerns regarding medicines are outlined above.    Signed, Maisie Fus, MD  11/14/2021 7:55 AM    [facility]

## 2021-11-14 NOTE — Patient Instructions (Addendum)
Medication Instructions:  START: ZETIA 10mg  ONCE DAILY   *If you need a refill on your cardiac medications before your next appointment, please call your pharmacy*  Lab Work: None Ordered At This Time.  If you have labs (blood work) drawn today and your tests are completely normal, you will receive your results only by: MyChart Message (if you have MyChart) OR A paper copy in the mail If you have any lab test that is abnormal or we need to change your treatment, we will call you to review the results.  Testing/Procedures: None Ordered At This Time.   Follow-Up: At [facility], you and your health needs are our priority.  As part of our continuing mission to provide you with exceptional heart care, we have created designated Provider Care Teams.  These Care Teams include your primary Cardiologist (physician) and Advanced Practice Providers (APPs -  Physician Assistants and Nurse Practitioners) who all work together to provide you with the care you need, when you need it.  Your next appointment:   3 month(s)  The format for your next appointment:   In Person  Provider:   [facility], MD    Other Instructions REFERRAL TO Dr. Maisie Fus FOR LOOP RECORDER MANAGEMENT- SOMEONE WILL REACH OUT TO GET YOU SCHEDULED

## 2021-11-14 NOTE — Patient Instructions (Signed)
Thank you, Ms.Novella Olive for allowing [place] to provide your care today. Today we discussed.  Life alert - we will work to get you a life alert device  Pads incontinence DME order - I will place an order for these supplies  Hoverround - Physical therapy will need to evaluate you prior to being considered for this  4. Personal care services - I will fill out paperwork to have someone come to the house to help with things like showering bathing, etc.   I have ordered the following labs for you:  Lab Orders  No laboratory test(s) ordered today    Referrals ordered today:    Referral Orders         Ambulatory referral to Speech Therapy      I have ordered the following medication/changed the following medications:   Stop the following medications: Medications Discontinued During This Encounter  Medication Reason   levothyroxine (SYNTHROID) 150 MCG tablet Reorder   atorvastatin (LIPITOR) 80 MG tablet Reorder     Start the following medications: Meds ordered this encounter  Medications   levothyroxine (SYNTHROID) 150 MCG tablet    Sig: Take 1 tablet (150 mcg total) by mouth daily.    Dispense:  90 tablet    Refill:  2   atorvastatin (LIPITOR) 80 MG tablet    Sig: Take 1 tablet (80 mg total) by mouth daily.    Dispense:  90 tablet    Refill:  1    DX Code Needed  .   pantoprazole (PROTONIX) 40 MG tablet    Sig: Take 1 tablet (40 mg total) by mouth daily.    Dispense:  30 tablet    Refill:  1   ondansetron (ZOFRAN) 4 MG tablet    Sig: Take 1 tablet (4 mg total) by mouth every 8 (eight) hours as needed for nausea or vomiting.    Dispense:  20 tablet    Refill:  0     Follow up:  3 months     Should you have any questions or concerns please call the internal medicine clinic at [phone].    Thalia Bloodgood, D.O. [facility]

## 2021-11-14 NOTE — Patient Instructions (Signed)
Start PT/OT next week for hopeful ongoing recovery   Continue Eliquis and atorvastatin  for secondary stroke prevention  You will be called to schedule a lower extremity ultrasound to ensure prior clot resolved and there is no new clot present  Your loop recorder will need to be monitored by cardiology  Continue to follow up with PCP regarding cholesterol and blood pressure management  Maintain strict control of hypertension with blood pressure goal below 130/90 and cholesterol with LDL cholesterol (bad cholesterol) goal below 70 mg/dL.   Signs of a Stroke? Follow the BEFAST method:  Balance Watch for a sudden loss of balance, trouble with coordination or vertigo Eyes Is there a sudden loss of vision in one or both eyes? Or double vision?  Face: Ask the person to smile. Does one side of the face droop or is it numb?  Arms: Ask the person to raise both arms. Does one arm drift downward? Is there weakness or numbness of a leg? Speech: Ask the person to repeat a simple phrase. Does the speech sound slurred/strange? Is the person confused ? Time: If you observe any of these signs, call 911.    Followup in the future with me in 4 months or call earlier if needed       Thank you for coming to see [place] at [facility]. I hope we have been able to provide you high quality care today.  You may receive a patient satisfaction survey over the next few weeks. We would appreciate your feedback and comments so that we may continue to improve ourselves and the health of our patients.

## 2021-11-14 NOTE — Progress Notes (Signed)
CC: Follow up CVA  HPI:  Maria Porter is a 46 y.o. female living with a history stated below and presents today for follow up for her CVA. Please see problem based assessment and plan for additional details.  Past Medical History:  Diagnosis Date   Acute deep vein thrombosis of right iliac vein (HCC) 09/14/2020   Stroke [facility])     Current Outpatient Medications on File Prior to Visit  Medication Sig Dispense Refill   albuterol (PROVENTIL) (2.5 MG/3ML) 0.083% nebulizer solution Take 3 mLs (2.5 mg total) by nebulization every 4 (four) hours as needed for wheezing or shortness of breath. 75 mL 2   albuterol (VENTOLIN HFA) 108 (90 Base) MCG/ACT inhaler Inhale 1-2 puffs into the lungs every 6 (six) hours as needed for wheezing or shortness of breath. 8.5 g 3   apixaban (ELIQUIS) 5 MG TABS tablet Take 1 tablet (5 mg total) by mouth 2 (two) times daily. 180 tablet 1   aspirin EC 81 MG tablet Take 1 tablet (81 mg total) by mouth daily. Swallow whole. 30 tablet 11   ezetimibe (ZETIA) 10 MG tablet Take 1 tablet (10 mg total) by mouth daily. 90 tablet 3   polyethylene glycol (MIRALAX / GLYCOLAX) 17 g packet Take by mouth.     No current facility-administered medications on file prior to visit.    Family History  Problem Relation Age of Onset   Sleep apnea Neg Hx     Social History   Socioeconomic History   Marital status: Single    Spouse name: Not on file   Number of children: Not on file   Years of education: Not on file   Highest education level: Not on file  Occupational History   Not on file  Tobacco Use   Smoking status: Former    Packs/day: 0.50    Types: Cigarettes    Start date: 03/07/2020    Quit date: 08/30/2021    Years since quitting: 0.2   Smokeless tobacco: Never   Tobacco comments:    1 pk per day.   Stopped x 1 month age  Substance and Sexual Activity   Alcohol use: Yes   Drug use: Not on file   Sexual activity: Not on file  Other Topics Concern   Not  on file  Social History Narrative   Lives with sister   Left Handed   Drinks 2-3 cups caffeine daily   Social Determinants of Health   Financial Resource Strain: Not on file  Food Insecurity: Not on file  Transportation Needs: Not on file  Physical Activity: Not on file  Stress: Not on file  Social Connections: Not on file  Intimate Partner Violence: Not on file    Review of Systems: ROS negative except for what is noted on the assessment and plan.  Vitals:   11/14/21 1038  BP: 123/76  Pulse: 86  Temp: 98.3 F (36.8 C)  TempSrc: Oral  SpO2: 100%  Weight: 217 lb 14.4 oz (98.8 kg)  Height: 5\' 6"  (1.676 m)    Physical Exam: Constitutional: No acute distress HENT: normocephalic atraumatic Eyes: conjunctiva non-erythematous Neck: supple Cardiovascular: regular rate and rhythm, no m/r/g Pulmonary/Chest: normal work of breathing on room air MSK: normal bulk and tone Neurological: alert & oriented x 3, dysarthria and aphasic speech Skin: warm and dry Psych: normal mood  Assessment & Plan:   History of CVA with residual deficit Assessment: Patient presents today for follow-up of recent CVA 08/2021.  She has right-sided residual deficits as well as aphasia.  She starts physical therapy in the next week and would like a new referral to speech therapy. Echocardiogram during work up without LAA or LV thrombus. Found to a small PFO that is not amendable to closure. Cardiology to interrogate her ILR to assess for afib (regular rate and rhythm on my exam). Neurology repeating lower extremity ultrasound to assess evaluate for DVT. Last clinic visit she was started back on eliquis. Per Ms. Varian every time she has discontinued eliquis she has had a CVA. Hypercoagulable workup has been negative in the past and unable to be repeated at this time while on eliquis. Will follow up lower extremity dopplers and interrogation of her loop device.  Prior clinic visit referral to hematology placed  to assess for hypercoagulable state, however they were unable to get in contact with patient to schedule the appointment. She is also requesting a handicap parking sticker which I will fill out as well as a Hoveround.  Asked that she talk with physical therapy about the Hoveround that she will need a full physical therapy evaluation prior to having this.  Plan: -Continue Eliquis 5 mg twice daily and aspirin 81 daily -Follow-up lower extremity ultrasounds and interrogation of her Medtronic device to assess for atrial fibrillation -Continue to have ongoing discussions with neurology and cardiology about continuation of anticoagulation -Continue speech therapy and Occupational Therapy -Parking sticker ordered   Hyperlipemia Assessment: Last LDL of 75 on atorvastatin 80 mg daily, cardiology added zetia 10 mg. Goal LDL under 70.   Plan: -continue atorvastatin 80mg , start zetia 10 mg   Pre-diabetes Last A1c of 5.8%, continue to encourage lifestyle modifications.   Patient discussed with Dr. Dario Ave, D.O. [facility], PGY-3 Phone: [phone] Date 11/15/2021 Time 7:32 AM

## 2021-11-14 NOTE — Progress Notes (Signed)
[facility] [address] [place]. [place] [address] [phone]       OFFICE FOLLOW UP VISIT NOTE  [name] Date of Birth:  11-22-1975 Medical Record Number:  [id number]   Referring MD: Axel Filler  Reason for Referral: Stroke    Chief Complaint  Maria Porter presents with   Follow-up    Pt reports Maria Porter had a stroke a month ago. Maria Porter states that it affected her right arm, right leg and speech. Room 2 alone        HPI: Initial visit 11/01/2020 Maria Porter is a 46 year old African-American lady seen today for initial office consultation visit for stroke.  History is obtained from the Maria Porter and review of electronic medical records as well as Care Everywhere and I personally reviewed pertinent imaging films in PACS.  Maria Porter has past medical history of remote left frontal stroke, obesity, hyperlipidemia who presented initially on 01/26/2020 with sudden onset aphasia and right hemiparesis while at home.  CT scan of the head on admission in the ED showed old left frontal infarct but CT angiogram was negative for LVO and CT perfusion showed no mismatch.  Symptoms are improving initial NIH stroke scale was 9 and improved to 3.  tPA was not administered due to rapidly improving symptoms.  MRI scan showed left frontal cortical and subcortical infarct adjacent to the old infarct.  MRI also showed old bilateral frontal and left inferior cerebellar infarcts of remote age.  MRA of the brain and neck showed no significant large vessel stenosis or occlusion.  EEG showed no seizure activity.  2D echo showed ejection fraction of 55 to 60%.  TCD bubble studies was positive with a few hits noted only after Valsalva history of her foot reveals clinically insignificant PFO.  TEE also showed a small right-to-left shunt but no cardiac source of embolism.  Lower extremity Dopplers were negative for DVT.  Spinal tap was obtained which showed WBC 3, red cells 10, glucose 72 and protein 44  mg percent and no evidence of infection.  LDL cholesterol is 125 mg percent and hemoglobin A1c was 5.8.  Hypercoagulable panel labs are negative except for slightly elevated anticardiolipin IgM antibody of 14.  ESR and C-reactive protein B12 are normal.  Maria Porter was started on aspirin and Plavix for 3 weeks followed by Plavix alone.  Tested positive for sleep apnea on the NOx 3 monitor and participated in the sleep smart study and was randomized to the medical treatment.  Maria Porter states Maria Porter is Maria Porter was doing well after that but while in [place] Maria Porter developed again sudden onset of aphasia and right hemiparesis and Maria Porter was [facility]  on 09/16/2020 with NIH stroke scale 19.  Maria Porter received IV tPA and CT angiogram showed left M1 occlusion Maria Porter underwent mechanical thrombectomy which was successful after second pass with TICI 2b revascularization.  2D echo and TEE were repeated which were unremarkable.  Lower extremity venous Dopplers on 09/13/2020 showed acute DVT in the right external iliac vein.  Maria Porter was started on Eliquis since then.  Maria Porter states Maria Porter started Eliquis well without bruising or bleeding.  Maria Porter states aphasia is improving though Maria Porter still has significant hesitancy and struggles to speak and has to speak slowly.  Her comprehension is quite good.  Right-sided strength is improving though Maria Porter has still weakness in the right grip and intrinsic hand muscles.  Maria Porter is able to ambulate independently.  Maria Porter has moved recently to [place]  and is now living with her sister.  Maria Porter has had normal recurrent stroke or TIA symptoms. Update 05/16/2021 : Maria Porter returns for follow-up after last visit 6 months ago.  Maria Porter states Maria Porter is doing well and has not had any recurrent stroke or TIA symptoms.  Maria Porter is participating in outpatient physical occupational and has recently started speech therapy which seems to be helping her.  Maria Porter is met most of her goals.  Maria Porter still has a different  [place] accent which has not resolved.  Maria Porter can walk short distances indoors without a cane but uses a cane for long distances.  Maria Porter remains on Eliquis for DVT which Maria Porter is tolerating well without bruising or bleeding.  Her blood pressure is under good control today it is 116/84.  Maria Porter is tolerating Lipitor well without muscle aches and pains.  Maria Porter continues to smoke but is trying to quit and primary physician started her on NicoDerm patch.  Maria Porter is started eating healthy and is losing some weight.  Maria Porter did not make an appointment to see Dr. Brett Fairy or Rexene Alberts whom I referred her to at last visit for sleep apnea but is now willing to do so.  Maria Porter continues to have mild expressive aphasia and spastic right hemiplegia which is unchanged.  Maria Porter now wants to drive.   Update 11/14/2021 JM: Maria Porter returns for 6 month follow up unaccompanied.   Presented to ED in [place] on 4/29 for evaluation of acute onset left-sided numbness and slurred speech, MRI brain showed large acute/subacute right cerebellar infarct, CTA R SCA occlusion. TTE no evidence of thrombus. UDS + cocaine.  Had loop recorder placed. D/c'd 5/4 to inpatient rehab and d/c'd home on 5/16.   Since discharge, Maria Porter has since moved back in her own apartment, Maria Porter was initially staying with her daughter in [place]. Reports continued left sided numbness and worsening baseline right sided weakness and speech difficulty. Has initial evaluation with PT/OT on 7/19. Use of RW for ambulation. Does maintain ADLs and majority of IADLs independently but does have some difficulty. Maria Porter does not drive. Daughter checks on her routinely. Denies any additional stroke/TIA symptoms since discharge.   Denies any cocaine use despite +UDS back in [place].   Was started on Eliquis 5 mg twice daily in addition to aspirin by PCP due to recent stroke that occurred 1 month after d/c'ing Eliquis for DVT. Repeat LE ultrasound not completed prior to stopping [facility] nor during April admission.  Denies any SE on Eliquis. Compliant on atorvastatin, denies side effects.  Blood pressure today 124/81, monitors at home and typically stable. Completed sleep study which did not show any significant sleep apnea.   No further concerns at this time     ROS:   14 system review of systems is positive for those listed in HPI and all other systems negative    PMH:  Past Medical History:  Diagnosis Date   Acute deep vein thrombosis of right iliac vein (HCC) 09/14/2020   Stroke [facility])     Social History:  Social History   Socioeconomic History   Marital status: Single    Spouse name: Not on file   Number of children: Not on file   Years of education: Not on file   Highest education level: Not on file  Occupational History   Not on file  Tobacco Use   Smoking status: Former    Packs/day: 0.50    Types: Cigarettes    Start date: 03/07/2020  Quit date: 08/30/2021    Years since quitting: 0.2   Smokeless tobacco: Never   Tobacco comments:    1 pk per day.   Stopped x 1 month age  Substance and Sexual Activity   Alcohol use: Yes   Drug use: Not on file   Sexual activity: Not on file  Other Topics Concern   Not on file  Social History Narrative   Lives with sister   Left Handed   Drinks 2-3 cups caffeine daily   Social Determinants of Health   Financial Resource Strain: Not on file  Food Insecurity: Not on file  Transportation Needs: Not on file  Physical Activity: Not on file  Stress: Not on file  Social Connections: Not on file  Intimate Partner Violence: Not on file    Medications:   Current Outpatient Medications on File Prior to Visit  Medication Sig Dispense Refill   albuterol (PROVENTIL) (2.5 MG/3ML) 0.083% nebulizer solution Take 3 mLs (2.5 mg total) by nebulization every 4 (four) hours as needed for wheezing or shortness of breath. 75 mL 2   albuterol (VENTOLIN HFA) 108 (90 Base) MCG/ACT inhaler Inhale 1-2 puffs into the lungs every 6 (six) hours as needed  for wheezing or shortness of breath. 8.5 g 3   apixaban (ELIQUIS) 5 MG TABS tablet Take 1 tablet (5 mg total) by mouth 2 (two) times daily. 180 tablet 1   aspirin EC 81 MG tablet Take 1 tablet (81 mg total) by mouth daily. Swallow whole. 30 tablet 11   buPROPion (WELLBUTRIN XL) 150 MG 24 hr tablet Take 1 tablet (150 mg total) by mouth every morning. 90 tablet 1   No current facility-administered medications on file prior to visit.    Allergies:  No Known Allergies  Physical Exam Today's Vitals   11/14/21 1335  BP: 124/81  Pulse: 70  Weight: 218 lb 6 oz (99.1 kg)  Height: 5' 6" (1.676 m)   Body mass index is 35.25 kg/m.   General: Obese very pleasant middle-aged African-American lady, seated, in no evident distress Head: head normocephalic and atraumatic.   Neck: supple with no carotid or supraclavicular bruits Cardiovascular: regular rate and rhythm, no murmurs Musculoskeletal: no deformity Skin:  no rash/petichiae Vascular:  Normal pulses all extremities  Neurologic Exam Mental Status: Awake and fully alert. Oriented to place and time. Recent and remote memory intact. Attention span, concentration and fund of knowledge appropriate. Mood and affect appropriate.  Mild expressive aphasia with nonfluent speech and some word finding difficulties.  Mild dysarthria noted. Able to name repeat and comprehend well. Cranial Nerves: Pupils equal, briskly reactive to light. Extraocular movements full without nystagmus. Visual fields full to confrontation. Hearing intact.  Moderate right lower facial weakness.  Sensation intact. Face, tongue, palate moves normally and symmetrically.  Motor: Normal strength on the left side..  RUE: 4-/5 proximal and 3/5 distal although strength tested limited due to limited mobility with increased spasticity.  RLE: 4/5 throughout with slightly increased tone Sensory: decreased left upper and lower extremity compared to right side Coordination: Rapid  alternating movements normal on left side. Finger-to-nose and heel-to-shin performed accurately on left side. Gait and Station: Arises from chair with mild difficulty. Stance is normal. Gait demonstrates mild spastic ataxic gait with partial right foot drop and uses a RW.  Tandem walking heel toe not attempted Reflexes: 2+ and asymmetric and brisker on the right. Toes downgoing.       ASSESSMENT/PLAN: 46 year old lady with multiple recurrent  strokes (left frontal MCA branch infarct 01/2020, L MCA stroke 09/2020 s/p thrombectomy and most recent right cerebellar stroke 08/2021 s/p ILR) with resultant right spastic hemiparesis, speech/language impairment and left-sided numbness.  Vascular risk factors DVT 09/2020 tx'd with Eliquis which was d/c'd 1 month prior to most recent stroke, HTN, HLD and obesity. Recent sleep study negative for apnea. Recent admin UDS +cocaine although pt declines use.     -Start PT/OT next week as scheduled, discussed typical recovery time and use of AD at all times unless otherwise instructed -no clear indication for use of Eliquis without evidence of atrial fibrillation or thrombus. Prior hypercoagulable labs negative. Will repeat LE ultrasound, if new DVT found, may need to consider life long AC. If negative, will further discuss risk vs benefit with Dr. Leonie Man -continue atorvastatin 80 mg daily for secondary stroke prevention measures -Ensure close PCP follow-up for aggressive stroke risk factor management including BP goal<130/90 and HLD with LDL goal<70    Follow up in 4 months or call earlier if needed   CC:  Gregory provider: Dr. Brain Hilts, Vasilios, MD    I spent 46 minutes of face-to-face and non-face-to-face time with Maria Porter.  This included previsit chart review including review of recent hospitalization, lab review, study review, order entry, electronic health record documentation, Maria Porter education and discussion regarding recurrent strokes including  possible etiology and further evaluation, residual deficits and typical recovery time, secondary stroke prevention measures and aggressive stroke risk factor management and answered all questions to Maria Porter's satisfaction  Frann Rider, AGNP-BC  [facility] [address] [place], [place] [address]  Phone [phone] Fax [phone] Note: This document was prepared with digital dictation and possible smart phrase technology. Any transcriptional errors that result from this process are unintentional.

## 2021-11-15 ENCOUNTER — Other Ambulatory Visit: Payer: Self-pay | Admitting: Student

## 2021-11-15 ENCOUNTER — Telehealth: Payer: Self-pay

## 2021-11-15 DIAGNOSIS — F17200 Nicotine dependence, unspecified, uncomplicated: Secondary | ICD-10-CM

## 2021-11-15 NOTE — Assessment & Plan Note (Signed)
Assessment: Last LDL of 75 on atorvastatin 80 mg daily, cardiology added zetia 10 mg. Goal LDL under 70.   Plan: -continue atorvastatin 80mg , start zetia 10 mg

## 2021-11-15 NOTE — Telephone Encounter (Signed)
   Telephone encounter was:  Successful.  11/15/2021 Name: Maria Porter MRN: [id number] DOB: [birth year]  Maria Porter is a 46 y.o. year old female who is a primary care patient of Katsadouros, Heidi Dach, MD . The community resource team was consulted for assistance with Food Insecurity and Financial Difficulties related to financial strain  Care guide performed the following interventions: Patient provided with information about care guide support team and interviewed to confirm resource needs.Patient is having financial strain and can't afford to pay utilities and food. I have put in referrals for [employer] and mailing resources to the patient  Follow Up Plan:  Care guide will follow up with patient by phone over the next 2 weeks   [facility] Guide, Embedded Care Coordination [facility], Care Management  [phone] [address] [place], [place], [place] [address] Phone: [phone] Email: [email or web address]

## 2021-11-15 NOTE — Assessment & Plan Note (Signed)
Last A1c of 5.8%, continue to encourage lifestyle modifications.

## 2021-11-15 NOTE — Assessment & Plan Note (Addendum)
Assessment: Patient presents today for follow-up of recent CVA 08/2021. She has right-sided residual deficits as well as aphasia.  She starts physical therapy in the next week and would like a new referral to speech therapy. Echocardiogram during work up without LAA or LV thrombus. Found to a small PFO that is not amendable to closure. Cardiology to interrogate her ILR to assess for afib (regular rate and rhythm on my exam). Neurology repeating lower extremity ultrasound to assess evaluate for DVT. Last clinic visit she was started back on eliquis. Per Ms. Morandi every time she has discontinued eliquis she has had a CVA. Hypercoagulable workup has been negative in the past and unable to be repeated at this time while on eliquis. Will follow up lower extremity dopplers and interrogation of her loop device.  Prior clinic visit referral to hematology placed to assess for hypercoagulable state, however they were unable to get in contact with patient to schedule the appointment. She is also requesting a handicap parking sticker which I will fill out as well as a Hoveround.  Asked that she talk with physical therapy about the Hoveround that she will need a full physical therapy evaluation prior to having this.  Plan: -Continue Eliquis 5 mg twice daily and aspirin 81 daily -Follow-up lower extremity ultrasounds and interrogation of her Medtronic device to assess for atrial fibrillation -Continue to have ongoing discussions with neurology and cardiology about continuation of anticoagulation -Continue speech therapy and Occupational Therapy -Parking sticker ordered

## 2021-11-16 ENCOUNTER — Telehealth: Payer: Self-pay | Admitting: *Deleted

## 2021-11-16 NOTE — Telephone Encounter (Signed)
CALLED DAUGHTER TIANA / LVM. WAITING CALL BACK AS TO IF HER MOTHER GOT AN APPOINTMENT FOR HER IN HOME NURSE VISIT.  PATIENT TO RETURN CALL TO [phone] or  [phone].

## 2021-11-21 ENCOUNTER — Other Ambulatory Visit: Payer: Self-pay

## 2021-11-21 ENCOUNTER — Ambulatory Visit (HOSPITAL_COMMUNITY)
Admission: RE | Admit: 2021-11-21 | Discharge: 2021-11-21 | Disposition: A | Discharge status: Home / Self Care | Payer: Medicaid Other | Source: Ambulatory Visit | Attending: Internal Medicine | Admitting: Internal Medicine

## 2021-11-21 ENCOUNTER — Ambulatory Visit (HOSPITAL_COMMUNITY)
Admission: RE | Admit: 2021-11-21 | Discharge: 2021-11-21 | Disposition: A | Discharge status: Home / Self Care | Payer: Medicaid Other | Source: Ambulatory Visit | Attending: Adult Health | Admitting: Adult Health

## 2021-11-21 DIAGNOSIS — I82403 Acute embolism and thrombosis of unspecified deep veins of lower extremity, bilateral: Secondary | ICD-10-CM | POA: Insufficient documentation

## 2021-11-21 DIAGNOSIS — Z95818 Presence of other cardiac implants and grafts: Secondary | ICD-10-CM | POA: Insufficient documentation

## 2021-11-21 DIAGNOSIS — I6930 Unspecified sequelae of cerebral infarction: Secondary | ICD-10-CM | POA: Insufficient documentation

## 2021-11-21 NOTE — Progress Notes (Signed)
Bilateral lower extremity venous duplex completed. Refer to "CV Proc" under chart review to view preliminary results.  11/21/2021 9:40 AM Eula Fried., MHA, RVT, RDCS, RDMS

## 2021-11-22 ENCOUNTER — Encounter: Payer: Self-pay | Admitting: Occupational Therapy

## 2021-11-22 ENCOUNTER — Encounter: Payer: Self-pay | Admitting: Physical Therapy

## 2021-11-22 ENCOUNTER — Ambulatory Visit: Payer: Medicaid Other | Admitting: Physical Therapy

## 2021-11-22 ENCOUNTER — Ambulatory Visit: Payer: Medicaid Other | Attending: Nurse Practitioner | Admitting: Occupational Therapy

## 2021-11-22 ENCOUNTER — Ambulatory Visit: Payer: Medicaid Other | Admitting: Speech Pathology

## 2021-11-22 VITALS — BP 148/92 | HR 96

## 2021-11-22 DIAGNOSIS — M6281 Muscle weakness (generalized): Secondary | ICD-10-CM | POA: Insufficient documentation

## 2021-11-22 DIAGNOSIS — R278 Other lack of coordination: Secondary | ICD-10-CM | POA: Diagnosis present

## 2021-11-22 DIAGNOSIS — M25611 Stiffness of right shoulder, not elsewhere classified: Secondary | ICD-10-CM | POA: Diagnosis present

## 2021-11-22 DIAGNOSIS — R2681 Unsteadiness on feet: Secondary | ICD-10-CM | POA: Insufficient documentation

## 2021-11-22 DIAGNOSIS — M25511 Pain in right shoulder: Secondary | ICD-10-CM | POA: Diagnosis present

## 2021-11-22 DIAGNOSIS — I69353 Hemiplegia and hemiparesis following cerebral infarction affecting right non-dominant side: Secondary | ICD-10-CM | POA: Diagnosis present

## 2021-11-22 DIAGNOSIS — R4701 Aphasia: Secondary | ICD-10-CM | POA: Insufficient documentation

## 2021-11-22 DIAGNOSIS — G8929 Other chronic pain: Secondary | ICD-10-CM | POA: Insufficient documentation

## 2021-11-22 DIAGNOSIS — R471 Dysarthria and anarthria: Secondary | ICD-10-CM | POA: Diagnosis present

## 2021-11-22 DIAGNOSIS — R41840 Attention and concentration deficit: Secondary | ICD-10-CM | POA: Insufficient documentation

## 2021-11-22 DIAGNOSIS — R2689 Other abnormalities of gait and mobility: Secondary | ICD-10-CM

## 2021-11-22 DIAGNOSIS — R482 Apraxia: Secondary | ICD-10-CM | POA: Insufficient documentation

## 2021-11-22 NOTE — Therapy (Addendum)
OUTPATIENT PHYSICAL THERAPY NEURO EVALUATION   [name] MRN: [id number] DOB:[birth year], 46 y.o., female Today's Date: 11/23/2021   PCP: Belva Agee, MD REFERRING PROVIDER: Carrolyn Leigh, FNP    PT End of Session - 11/22/21 1414     Visit Number 1    Number of Visits 5   4+eval   Date for PT Re-Evaluation 01/19/22   pushed out due to scheduling multi-D appts   Authorization Type [facility] Medicaid - pt has 14 visits remaining for 2023 for combined disciplines    PT Start Time 1400    PT Stop Time 1445    PT Time Calculation (min) 45 min    Equipment Utilized During Treatment Gait belt    Activity Tolerance Patient tolerated treatment well;Treatment limited secondary to medical complications (Comment)   excess rest provided to prevent excessive inc in BP   Behavior During Therapy [facility] for tasks assessed/performed             Past Medical History:  Diagnosis Date   Acute deep vein thrombosis of right iliac vein (HCC) 09/14/2020   Stroke [facility])    Past Surgical History:  Procedure Laterality Date   BUBBLE STUDY  01/29/2020   Procedure: BUBBLE STUDY;  Surgeon: Little Ishikawa, MD;  Location: [facility] ENDOSCOPY;  Service: Cardiovascular;;   TEE WITHOUT CARDIOVERSION N/A 01/29/2020   Procedure: TRANSESOPHAGEAL ECHOCARDIOGRAM (TEE);  Surgeon: Little Ishikawa, MD;  Location: [facility] ENDOSCOPY;  Service: Cardiovascular;  Laterality: N/A;   Patient Active Problem List   Diagnosis Date Noted   GERD (gastroesophageal reflux disease) 07/20/2021   Right shoulder pain 05/23/2021   Healthcare maintenance 03/07/2021   History of DVT (deep vein thrombosis) 10/06/2020   Family history of premature coronary heart disease 09/14/2020   OSA (obstructive sleep apnea) 09/14/2020   Pre-diabetes 02/03/2020   Subclinical hypothyroidism 02/03/2020   Tobacco use disorder 01/27/2020   Hyperlipemia 01/27/2020   Obesity 01/27/2020   History of CVA with residual  deficit 01/26/2020   Extrinsic asthma 10/09/2004    ONSET DATE: 09/20/2021 (referral following most recent CVA in May)  REFERRING DIAG: Z78.9 (ICD-10-CM) - Other specified health status Z86.718 (ICD-10-CM) - Personal history of other venous thrombosis and embolism F14.91 (ICD-10-CM) - Cocaine use, unspecified, in remission Z74.09 (ICD-10-CM) - Impaired mobility I63.9 (ICD-10-CM) - Cerebral infarction, unspecified I63.541 (ICD-10-CM) - Cerebral infarction due to unspecified occlusion or stenosis of right cerebellar artery Z78.9 (ICD-10-CM) - Decreased independence with activities of daily living   THERAPY DIAG:  Unsteadiness on feet  Hemiplegia and hemiparesis following cerebral infarction affecting right non-dominant side (HCC)  Other abnormalities of gait and mobility  Muscle weakness (generalized)  Rationale for Evaluation and Treatment Rehabilitation  SUBJECTIVE:  SUBJECTIVE STATEMENT: "I walk without a walker at home, I have an issue with balance.  I use the walker when I need to like in the kitchen I use my rolling walker and I sit on it (rollator)."   Pt accompanied by: self  PERTINENT HISTORY: DVT 09/2020 on eliquis, financial strain, left PICA CVA, left medial cerebellar and frontal/subcortical CVAs, left MCA occlusion w/ hemiparesis (01/27/2020), smoking, right occipital CVA w/ concurrent right superior cerebellar artery infarct 09/2021, cocaine use, hyperlipidemia  PAIN:  Are you having pain? No  PRECAUTIONS: Fall  WEIGHT BEARING RESTRICTIONS No  FALLS: Has patient fallen in last 6 months? No  LIVING ENVIRONMENT: Lives with: lives alone Lives in: House/apartment Stairs: No Has following equipment at home: Single point cane, Environmental consultant - 2 wheeled, Environmental consultant - 4 wheeled, shower chair, bed  side commode, and Grab bars-in shower only  PLOF: Independent with basic ADLs, Independent with household mobility with device, Independent with household mobility without device, Independent with community mobility with device, Independent with transfers, Requires assistive device for independence, and pt has someone coming to clean her home next week  PATIENT GOALS "To have a cane."  OBJECTIVE:   DIAGNOSTIC FINDINGS: Imaging related to most recent stroke performed outside of [facility] ([facility]  COGNITION: Overall cognitive status: Impaired and pt states her processing speed is longer now and it takes her longer to figure out how to do things and then to actually do them, she uses a journal to write down reminders because she cannot always say them well.   SENSATION: Light touch: WFL Hot/Cold: Impaired -she has burned her RUE (hand and wrist) when cooking because she will bump into the inside of the oven putting in or taking something out and not realize it is hot until she has a burn (noted several small scars that appear to be healed)  COORDINATION: Heel-to-shin:  WFL bilaterally LE RAMPS:  slow and deliberate  EDEMA:  None noted bilaterally  MUSCLE TONE: RLE: Mild extensor tone noted and sustained when testing for clonus, not noted with modified ashworth testing  POSTURE: rounded shoulders and forward head  LOWER EXTREMITY ROM:     Active  Right Eval Left Eval  Hip flexion WFL  Hip extension    Hip abduction    Hip adduction    Hip internal rotation    Hip external rotation    Knee flexion WFL  Knee extension   Ankle dorsiflexion   Ankle plantarflexion    Ankle inversion    Ankle eversion     (Blank rows = not tested)  LOWER EXTREMITY MMT:    MMT Right Eval Left Eval  Hip flexion 4/5 4/5  Hip extension    Hip abduction    Hip adduction    Hip internal rotation    Hip external rotation    Knee flexion 4-/5 4-/5  Knee extension 4/5 4/5  Ankle dorsiflexion 4/5  4-/5  Ankle plantarflexion    Ankle inversion    Ankle eversion    (Blank rows = not tested)  BED MOBILITY:  Sit to supine Modified independence Supine to sit Modified independence  TRANSFERS: Assistive device utilized: Environmental consultant - 2 wheeled  Sit to stand: Modified independence Stand to sit: Modified independence Chair to chair: Modified independence  GAIT: Gait pattern: step through pattern, decreased stride length, shuffling, decreased trunk rotation, and trunk flexed Distance walked: various clinic distances Assistive device utilized: Walker - 2 wheeled Level of assistance: SBA and CGA Comments: Slow gait  speed, difficulty with appropriate approximation to AD during turning and approaching sitting.  FUNCTIONAL TESTs:  5 times sit to stand: 24.07 sec w/ BUE on RW 10 meter walk test: 14.97 sec w/ RW, pt dyspneic following = 0.67 m/sec OR 2.20 ft/sec  PATIENT SURVEYS:  FOTO N/A due to chronicity of stroke and recurrence.  TODAY'S TREATMENT:  N/A   PATIENT EDUCATION: Education details: PT POC, assessments used, goals set.  Discussed Medicaid limits in regards to multidisciplinary care, out-of-pocket purchase of hoveround vs wheelchair evaluation w/o visits left to cover that, and limits of wheelchair evaluation due to ambulatory status.  Discussed cortisone shot as pt states MD told her to ask therapists for opinion.  PT not endorsing steroid shot at this time as shoulder stiffness potentially related to tone rather than joint passive structures, instructed pt to follow-up with OT for further opinion. Person educated: Patient Education method: Explanation Education comprehension: verbalized understanding   HOME EXERCISE PROGRAM: To be established.  GOALS: Goals reviewed with patient? Yes  SHORT TERM GOALS: Target date: 12/15/2021  Pt will be independent with strength and balance HEP. Baseline:  To be established. Goal status: INITIAL  2.  Gait training w/ SPC will be  initiated to assess for pt safety with ambulatory distances <200 feet. Baseline: To be initiated. Goal status: INITIAL  LONG TERM GOALS: Target date: 01/05/2022  Pt will adhere to walking program 2-3 days per week using LRAD to promote aerobic gains and general wellness. Baseline: Sedentary due to mobility limitations. Goal status: INITIAL  2.  Pt will decrease 5xSTS to </=15 seconds w/ BUE support on chair in order to demonstrate decreased risk for falls and improved functional bilateral LE strength and power. Baseline: 24.07 sec w/ BUE on RW Goal status: INITIAL  3.  Pt will demonstrate a gait speed of >/=2.60 feet/sec w/ LRAD in order to decrease risk for falls. Baseline: 2.20 ft/sec w/ RW Goal status: INITIAL  4.  Pt will ambulate >/= 350' safely with LRAD to promote household and limited community distances and activity tolerance. Baseline: various clinic distances, reports inability to ambulate > Goal status: INITIAL  ASSESSMENT:  CLINICAL IMPRESSION: Patient is a 46 y.o. female who was seen today for physical therapy evaluation and treatment for mobility deficits following recurrent CVAs.  Pt has a significant PMH of DVT 09/2020 on eliquis, financial strain, left PICA CVA, left medial cerebellar and frontal/subcortical CVAs, left MCA occlusion w/ hemiparesis (01/27/2020), smoking, right occipital CVA w/ concurrent right superior cerebellar artery infarct 09/2021, cocaine use, and hyperlipidemia.  Identified impairments include decreased activity tolerance, gait deviations, mild RLE extensor tone, impaired sensation particularly to temperature resulting in prior injury, impaired cognition, and mildly impaired LE coordination.  Evaluation via the following assessment tools: 5xSTS and indicate fall risk.  She would benefit from skilled PT to address impairments as noted and progress towards long term goals.  OBJECTIVE IMPAIRMENTS Abnormal gait, decreased activity tolerance,  decreased balance, decreased cognition, decreased coordination, decreased endurance, decreased knowledge of use of DME, decreased mobility, decreased strength, decreased safety awareness, impaired sensation, impaired tone, and impaired UE functional use.   ACTIVITY LIMITATIONS carrying, lifting, bending, standing, stairs, and locomotion level  PARTICIPATION LIMITATIONS: cleaning, driving, and community activity  PERSONAL FACTORS Fitness, Past/current experiences, Social background, Time since onset of injury/illness/exacerbation, Transportation, and 3+ comorbidities: hx of recurrent CVAs, DVT risk, smoking, cocaine use, hyperlipidemia, and financial strain  are also affecting patient's functional outcome.   REHAB POTENTIAL: Fair  See personal factors  CLINICAL DECISION MAKING: Evolving/moderate complexity  EVALUATION COMPLEXITY: Moderate  PLAN: PT FREQUENCY: 1x/week  PT DURATION: 8 weeks-will not use every week due to visit limit  PLANNED INTERVENTIONS: Therapeutic exercises, Therapeutic activity, Neuromuscular re-education, Balance training, Gait training, Patient/Family education, Self Care, Stair training, Vestibular training, DME instructions, and Re-evaluation  PLAN FOR NEXT SESSION: Address gait training w/ RW vs SPC for safety, HEP for strength/static/dynamic balance, SciFit/NuStep for endurance  Managed medicaid CPT codes: 19147 - PT Re-evaluation, 97110- Therapeutic Exercise, [phone]- Neuro Re-education, [phone] - Gait Training, [phone] - Therapeutic Activities, and 97535 - Self Care  Sadie Haber, PT, DPT 11/23/2021, 12:54 PM

## 2021-11-22 NOTE — Therapy (Signed)
OUTPATIENT OCCUPATIONAL THERAPY NEURO EVALUATION  [name] MRN: [id number] DOB:1975-12-03, 46 y.o., female Today's Date: 11/22/2021  PCP: Belva Agee, MD REFERRING PROVIDER: Carrolyn Leigh, FNP   OT End of Session - 11/22/21 1343     Visit Number 1    Number of Visits 5    Date for OT Re-Evaluation 01/17/22   4 visits over 8 weeks d/t scheduling conflicts   Authorization Type [facility] Medicaid    Authorization Time Period VL: 27 combined - used 13 at time of Eval    OT Start Time 1315    OT Stop Time 1400    OT Time Calculation (min) 45 min    Activity Tolerance Patient tolerated treatment well    Behavior During Therapy [facility] for tasks assessed/performed             Past Medical History:  Diagnosis Date   Acute deep vein thrombosis of right iliac vein (HCC) 09/14/2020   Stroke [facility])    Past Surgical History:  Procedure Laterality Date   BUBBLE STUDY  01/29/2020   Procedure: BUBBLE STUDY;  Surgeon: Little Ishikawa, MD;  Location: [facility] ENDOSCOPY;  Service: Cardiovascular;;   TEE WITHOUT CARDIOVERSION N/A 01/29/2020   Procedure: TRANSESOPHAGEAL ECHOCARDIOGRAM (TEE);  Surgeon: Little Ishikawa, MD;  Location: [facility] ENDOSCOPY;  Service: Cardiovascular;  Laterality: N/A;   Patient Active Problem List   Diagnosis Date Noted   GERD (gastroesophageal reflux disease) 07/20/2021   Right shoulder pain 05/23/2021   Healthcare maintenance 03/07/2021   History of DVT (deep vein thrombosis) 10/06/2020   Family history of premature coronary heart disease 09/14/2020   OSA (obstructive sleep apnea) 09/14/2020   Pre-diabetes 02/03/2020   Subclinical hypothyroidism 02/03/2020   Tobacco use disorder 01/27/2020   Hyperlipemia 01/27/2020   Obesity 01/27/2020   History of CVA with residual deficit 01/26/2020   Extrinsic asthma 10/09/2004     Rationale for Evaluation and Treatment Rehabilitation    ONSET DATE: referral date  09/20/2021  REFERRING DIAG:  Z78.9 (ICD-10-CM) - Decreased independence with activities of daily living  I63.541 (ICD-10-CM) - Cerebral infarction due to unspecified occlusion or stenosis of right cerebellar artery  I63.9 (ICD-10-CM) - Cerebral infarction, unspecified  F14.91 (ICD-10-CM) - Cocaine use, unspecified, in remission  Z74.09 (ICD-10-CM) - Other reduced mobility  Z78.9 (ICD-10-CM) - Other specified health status    THERAPY DIAG:  Unsteadiness on feet  Hemiplegia and hemiparesis following cerebral infarction affecting right non-dominant side (HCC)  Chronic right shoulder pain  Stiffness of right shoulder, not elsewhere classified  Muscle weakness (generalized)  Other lack of coordination  Attention and concentration deficit  SUBJECTIVE:   SUBJECTIVE STATEMENT: Pt reports having another stroke. Pt has been at this facility prior.   Pt accompanied by: self  PERTINENT HISTORY: CVA 09/11/20   PMH:  hx of prior CVA (approx 4 yrs ago affecting L side, 01/2020 affecting R side, and 09/11/20 affecting R side); obesity, hyperlipidemia, sleep apnea   PRECAUTIONS: Fall, Loop Recorder, Aphasia  WEIGHT BEARING RESTRICTIONS No  PAIN:  Are you having pain? No  FALLS: Has patient fallen in last 6 months? No  LIVING ENVIRONMENT: Lives with: lives alone, no pets, daughter comes e/o weekend, CNAs will be coming but have not started. Lives in: House/apartment no stairs. Stairs:  tub/shower combo w/ tub bench  Has following equipment at home: Single point cane, Walker - 2 wheeled, Marine scientist  PLOF: Vocation/Vocational requirements: on disability, Leisure: crochet, and Independent before most  recent stroke  PATIENT GOALS "get RUE working and using cane vs walker"  OBJECTIVE:      HAND DOMINANCE: Left  ADLs: Overall ADLs: mod I right now Equipment: Transfer tub bench   IADLs: Shopping: online Light housekeeping: daughter completing laundry Meal Prep: completing  cooking Community mobility: relies on transportation Medication management: independent Financial management: independent Handwriting:  reports no changes.  MOBILITY STATUS: Needs Assist: uses rolling walker (2 wheel) with SBA   FUNCTIONAL OUTCOME MEASURES: FOTO: did not complete at check in  UE ROM     Active ROM Right 09/04/2021 Left 09/04/2021  Shoulder flexion 60* PROM No active noted [facility]  Shoulder abduction    Shoulder adduction    Shoulder extension    Shoulder internal rotation    Shoulder external rotation    Elbow flexion Active movement noted at evaluation   Elbow extension    Wrist flexion    Wrist extension    Wrist ulnar deviation    Wrist radial deviation    Wrist pronation    Wrist supination    (Blank rows = not tested)    HAND FUNCTION: Grip strength: Right: 20.9 lbs; Left: 52 lbs and Pt demonstrates 100% composite flexion/extension with BUE  COORDINATION: Finger Nose Finger test: RUE impaired d/t limited shoulder and dysmetria 9 Hole Peg test: Right: 79.31 sec; Left: 19.66 sec Box and Blocks:  Right 19blocks, Left 57blocks  SENSATION: Pt reports numbness in LUE   MUSCLE TONE: RUE: Severe in RUE shoulder  COGNITION: Overall cognitive status:  aphasia  VISION: Subjective report: denies any changes Baseline vision: Wears glasses for reading only Visual history:  none  VISION ASSESSMENT: Diplopia assessment: reports having diplopia before but has resolved.  PERCEPTION: WFL  PRAXIS: WFL  OBSERVATIONS: Pt with very limited active RUE shoulder flexion and severe tone.  TODAY'S TREATMENT:  11/22/21  None today - evaluation only.   PATIENT EDUCATION: Education details: Education on role and purpose of OT  Person educated: Patient Education method: Explanation Education comprehension: verbalized understanding   HOME EXERCISE PROGRAM: To Be Established    GOALS: Goals reviewed with patient? No  SHORT TERM GOALS: Target date:  12/20/2021    Status:  1 Pt will be independent with HEPs targeting RUE shoulder range of motion.  Baseline: review previous HEPs and update Initial  2 Pt will increase range of motion in RUE shoulder flexion by at least 10 degrees of PROM to progress towards ability to reach items at low level. Baseline: 60* PROM Initial      LONG TERM GOALS: Target date: 01/17/2022    Status:  1 Pt will be independent with updated HEPs targeting RUE shoulder ROM and coordination  Baseline: continue to update throughout POC Initial  2 Pt will demonstrate active shoulder flexion to at least 15* in RUE to progress towards low level reaching. Baseline: 60* PROM, 0* AROM Initial  3 Pt will increase fine motor coordination in RUE by improving 9 hole peg test to completing in 60 seconds or less. Baseline: RUE 79.31s, LUE 19.66s Initial  4 Pt will improve function use of RUE by increasing Box and Blocks score by at least 5 blocks Baseline: LUE 57, RUE 19 Initial    ASSESSMENT:  CLINICAL IMPRESSION: Patient is a 46 y.o. female who was seen today for occupational therapy evaluation for multiple strokes. Pt presents with deficits with RUE coordination, strength, range of motion and unsteadiness on feet. Skilled occupational therapy is recommended to target listed  areas of deficit and increase independence with ADLs and IADLs and decrease caregiver burden.     PERFORMANCE DEFICITS in functional skills including ADLs, IADLs, coordination, dexterity, tone, ROM, strength, flexibility, FMC, GMC, decreased knowledge of use of DME, and UE functional use, cognitive skills including attention  IMPAIRMENTS are limiting patient from ADLs, IADLs, and leisure.   COMORBIDITIES may have co-morbidities  that affects occupational performance. Patient will benefit from skilled OT to address above impairments and improve overall function.  MODIFICATION OR ASSISTANCE TO COMPLETE EVALUATION: Min-Moderate modification of tasks or  assist with assess necessary to complete an evaluation.  OT OCCUPATIONAL PROFILE AND HISTORY: Detailed assessment: Review of records and additional review of physical, cognitive, psychosocial history related to current functional performance.  CLINICAL DECISION MAKING: Moderate - several treatment options, min-mod task modification necessary  REHAB POTENTIAL: Good  EVALUATION COMPLEXITY: Moderate    PLAN: OT FREQUENCY: 1x/week  OT DURATION: 8 weeks  PLANNED INTERVENTIONS: self care/ADL training, therapeutic exercise, therapeutic activity, neuromuscular re-education, manual therapy, passive range of motion, functional mobility training, ultrasound, patient/family education, and DME and/or AE instructions  RECOMMENDED OTHER SERVICES: PT and ST eval today. Recommend both services however patient limited by visit limit  CONSULTED AND AGREED WITH PLAN OF CARE: Patient  PLAN FOR NEXT SESSION: review supine HEP and table slides for shoulder, RUE shoulder range of motion and gravity eliminated planes   Junious Dresser, OT 11/22/2021, 4:33 PM

## 2021-11-22 NOTE — Therapy (Signed)
OUTPATIENT SPEECH LANGUAGE PATHOLOGY APHASIA EVALUATION   [name] MRN: [id number] DOB:1975/09/10, 46 y.o., female Today's Date: 11/23/2021  PCP: Belva Agee, MD REFERRING PROVIDER: Tyson Alias, MD   End of Session - 11/23/21 [phone]     Visit Number 1    Number of Visits 5    Date for SLP Re-Evaluation 01/18/22    Authorization Type [employer] Medicaid - pt has 14 visits remaining for 2023 for combined disciplines    SLP Start Time 0330    SLP Stop Time  0615    SLP Time Calculation (min) 165 min    Activity Tolerance Patient tolerated treatment well             Past Medical History:  Diagnosis Date   Acute deep vein thrombosis of right iliac vein (HCC) 09/14/2020   Stroke [facility])    Past Surgical History:  Procedure Laterality Date   BUBBLE STUDY  01/29/2020   Procedure: BUBBLE STUDY;  Surgeon: Little Ishikawa, MD;  Location: [facility] ENDOSCOPY;  Service: Cardiovascular;;   TEE WITHOUT CARDIOVERSION N/A 01/29/2020   Procedure: TRANSESOPHAGEAL ECHOCARDIOGRAM (TEE);  Surgeon: Little Ishikawa, MD;  Location: [facility] ENDOSCOPY;  Service: Cardiovascular;  Laterality: N/A;   Patient Active Problem List   Diagnosis Date Noted   GERD (gastroesophageal reflux disease) 07/20/2021   Right shoulder pain 05/23/2021   Healthcare maintenance 03/07/2021   History of DVT (deep vein thrombosis) 10/06/2020   Family history of premature coronary heart disease 09/14/2020   OSA (obstructive sleep apnea) 09/14/2020   Pre-diabetes 02/03/2020   Subclinical hypothyroidism 02/03/2020   Tobacco use disorder 01/27/2020   Hyperlipemia 01/27/2020   Obesity 01/27/2020   History of CVA with residual deficit 01/26/2020   Extrinsic asthma 10/09/2004    ONSET DATE:  referral date 09/20/2021  REFERRING DIAG: I69.30 (ICD-10-CM) - History of CVA with residual deficit   THERAPY DIAG:  Aphasia  Dysarthria and anarthria  Rationale for Evaluation and Treatment  Rehabilitation  SUBJECTIVE:   SUBJECTIVE STATEMENT: "My speech was imperfect before"  Pt accompanied by: self  PERTINENT HISTORY: 46 year old lady with multiple recurrent strokes (left frontal MCA branch infarct 01/2020, L MCA stroke 09/2020 s/p thrombectomy and most recent right cerebellar stroke 08/2021 s/p ILR) with resultant right spastic hemiparesis, speech/language impairment and left-sided numbness.   PAIN:  Are you having pain? No  FALLS: Has patient fallen in last 6 months?  See PT evaluation for details  LIVING ENVIRONMENT: Lives with: lives alone Lives in: House/apartment  PLOF:  Level of assistance: Independent with IADLs Employment: On disability   PATIENT GOALS "to speak pleasant and appropriate the people"   OBJECTIVE:   COGNITION: Overall cognitive status: Difficulty to assess due to: Communication impairment and no family present Areas of impairment:  Memory: Impaired: Short term Prospective Functional deficits: is writing things down, then unable to recall where she wrote them; unable to further assess   AUDITORY COMPREHENSION: Overall auditory comprehension: Appears intact YES/NO questions: Appears intact Following directions: Appears intact Conversation: Moderately Complex Interfering components:  n/a Effective technique: extra processing time   READING COMPREHENSION: Intact  EXPRESSION: verbal and nonverbal- gestures  VERBAL EXPRESSION: Level of generative/spontaneous verbalization: word, phrase, sentence, and conversation Automatic speech: month of year: impaired noted to be laborious, with excessive pauses for presumed simplicity and knowledge of task Repetition: Impaired: Word and sentence increasing imprecision with mutisyllabic words or extended sentences Naming: Confrontation: 76-100% and Divergent: names x7 animals and x4 "c" words Pragmatics: Appears  intact Discourse task: SLP attempted to elicit personal narrative. Characterized by  vague language, frequent word finding episodes, and agrammatism and difficult to follow narrative. Given task to tell story of cinderella, pt able to relay story with all relevant components despite notable difficulty with discourse cohesion. X3 episodes of anomia, requiring extended time to repair.  Comments: SLP used dynamic assessment in convergent naming task, providing pt with initial instruction for use of self-cueing strategy of ID first letter of word, saying the sound to achieve verbal production when instance of anomia occurs. Following initial instruction, pt able to self-cue and achieve targeted word in 6/8 instances of anomia. X2 instances of semantic paraphasia present during naming task.  Interfering components: premorbid deficit Effective technique: phonemic cues, written cues, and articulatory cues Non-verbal means of communication: gestures and writing  WRITTEN EXPRESSION: Dominant hand: left  Written expression: Appears intact at word and sentence level  MOTOR SPEECH: Overall motor speech: impaired Level of impairment: Word, Phrase, Sentence, and Conversation Respiration: thoracic breathing Phonation: normal Resonance: WFL Articulation: Impaired: word, phrase, sentence, and conversation Intelligibility: Intelligibility reduced to approximately 95%.   Motor planning: Appears intact Motor speech errors: aware Interfering components:  Foreign accent syndrome Effective technique: slow rate, over articulate, pause, and pacing   ORAL MOTOR EXAMINATION Overall status: WFL Comments: unremarkable  STANDARDIZED ASSESSMENTS: QAB: Moderate   PATIENT REPORTED OUTCOME MEASURES (PROM): Communication Participation Item Bank: 0 Pt reports "very much" impact of all questions posed. ID increasing confidence in speaking when out in community would have biggest impact on patients communication confidence.   TODAY'S TREATMENT:  Initiated education and coaching on use anomia  compensations to aid in expressive language. Initiated home exercise program with emphasis on importance of d/t limited visits.    PATIENT EDUCATION: Education details: evaluation results, SLP recommendations, POC/goals Person educated: Patient Education method: Explanation, Demonstration, Verbal cues, and Handouts Education comprehension: verbalized understanding, returned demonstration, and needs further education   GOALS: Goals reviewed with patient? Yes  SHORT TERM GOALS: Target date: 12/21/2021  Pt will successfully utilize trained anomia compensations in structured language tasks, given occasional min-A in 75% of opportunities over 2 sessions.  Baseline: not using any compensations, avoiding communication Goal status: INITIAL  2.  Pt will name 10 items in x3 personally relevant categories over 2 sessions with occasional min-A.  Baseline: x5 items during divergent naming task Goal status: INITIAL  3.  Pt will identify x2 opportunities to speak to people outside of typically talked to family members, and have brief conversational exchange, self-reporting a score of 2/5 for communication effectiveness.  Baseline: 0/5 self-rating for efficacy when talking to people outside of immediate family members and best friend  Goal status: INITIAL   LONG TERM GOALS: Target date: 01/18/2022  Pt will successfully utilize trained anomia compensations in 15 minute moderately complex conversational exchange in 75% of opportunities, given rare min-A.  Baseline: not using anomia compensations Goal status: INITIAL  2.  Pt will improve her score on the Communication Participation Item Bank short form by 2 points  Baseline: initial score of 0 Goal status: INITIAL  3.  Pt will report carryover of anomia compensations, resulting in subjective feeling if improved communication effectiveness while out in community, over 1 week period.  Baseline: avoiding conversations, ID talking while out in  community as greatest challenge Goal status: INITIAL   ASSESSMENT:  CLINICAL IMPRESSION: Patient is a 46 y.o. F who was seen today for aphasia, in presence of comorbid foreign accent disorder  and dysarthria, following multiple strokes. Ms. Scheele reports to significant impact of aphasia on her quality of life. She is avoiding communication aside from talking to children and best friend, Varney Biles. Not currently employing any anomia compensations, aside from giving self extended periods of time, with is only intermittently successful in ultimate relaying of desired message. Based on evaluation results, detailed above, pt presents with moderate aphasia which is characterized by frequent anomia which increases with increasing of complexity and frequent demonstration of vague speech which impacts cohesive discourse abilities. Dynamic assessment demonstrates pt is stimulible to self-cueing to compensate for word level anomia occurences with min-A. Motor speech is affected by imprecise articulation which pt appears to be compensating for effectively using slowed rate and over-articulation. Pt would benefit from skilled ST to address aphasia to enable effective communication with friends, family members, and community members. Unfortunately, pt has limited visits remaining for year so therapy plan has been modified to accommodate that limitation despite likelihood that pt would benefit from more frequent and extended therapy course. Will compensate for limitation by proving robust HEP. Pt in agreement with POC.   OBJECTIVE IMPAIRMENTS include expressive language, aphasia, and dysarthria. These impairments are limiting patient from return to work and effectively communicating at home and in community. Factors affecting potential to achieve goals and functional outcome are previous level of function, financial resources, and family/community support. Patient will benefit from skilled SLP services to address above  impairments and improve overall function.  REHAB POTENTIAL: Fair limited visits available  PLAN: SLP FREQUENCY: 1x/week  SLP DURATION: 8 weeks  PLANNED INTERVENTIONS: Language facilitation, Cueing hierachy, Internal/external aids, Functional tasks, Multimodal communication approach, SLP instruction and feedback, Compensatory strategies, and Patient/family education   Managed medicaid CPT codes: [phone] - SLP treatment    Su Monks, CCC-SLP 11/23/2021, 10:38 AM

## 2021-11-22 NOTE — Patient Instructions (Signed)
Aphasia Therapy: How to Practice at Home   WORD LEVEL Go around the rooms in your home and name each object you see.  Go outside and name what you see. Open drawers, cabinets, closets and name the items you see. Look around and name items that begin with a certain letter. Look around and name items that are a certain color.   PHRASE/SENTENCE LEVEL Use a carrier phrase as you name items around your house/apartment, such as: "This is my _____", "I have _____", "Where is my _____".  Describe what you are doing during your daily routine or chores.  Bathroom: Brushing teeth, washing face Dressing: Items of clothing you want to wear Kitchen: Name the item you are putting away from the sink or dishwasher. Say the steps for making food.  Laundry: Name the item you are folding/putting away.  Look through pictures and describe who, what, where, when, and why for each picture.   READING Reading out loud is a good way to practice your speech. You can read: Newspapers, magazines, recipes, letters, bills, text messages/emails   WRITING Write the names of items around your house, to-do lists, grocery lists, medication logs, or daily activity logs. Write down your family member's names and organize it into a family tree Write down common personal phrases you usually say.  Write a summary of a news story or short video you watch online or on TV.   *Created by [employer] (2019)    [facility] [place] with Dr. Cynda Familia at [facility] - email [email or web address]     Provided by: Rolin Barry ST, [phone]

## 2021-11-22 NOTE — Progress Notes (Signed)
Internal Medicine Clinic Attending  Case discussed with the resident at the time of the visit.  We reviewed the resident's history and exam and pertinent patient test results.  I agree with the assessment, diagnosis, and plan of care documented in the resident's note.  

## 2021-11-23 ENCOUNTER — Encounter: Payer: Self-pay | Admitting: Speech Pathology

## 2021-11-27 ENCOUNTER — Telehealth: Payer: Self-pay | Admitting: Adult Health

## 2021-11-27 ENCOUNTER — Encounter: Payer: Medicaid Other | Admitting: Speech Pathology

## 2021-11-27 ENCOUNTER — Ambulatory Visit: Payer: Medicaid Other | Admitting: Physical Therapy

## 2021-11-27 ENCOUNTER — Telehealth: Payer: Self-pay | Admitting: Student

## 2021-11-27 NOTE — Telephone Encounter (Signed)
Pt states she has already called the pharmacy and has not been able to pick up her medications listed below:   atorvastatin (LIPITOR) 80 MG tablet  levothyroxine (SYNTHROID) 150 MCG tablet   [id number] - [place], [place] - [address] (Ph: [phone])

## 2021-11-27 NOTE — Telephone Encounter (Signed)
Please advise patient that recent lower extremity ultrasound did not show any evidence of residual or new DVT.    Further discussed with Dr. Pearlean Brownie regarding current use of Eliquis: "Agree there is no clear indication for long-term anticoagulation in patients with recurrent cryptogenic strokes in whom A-fib is not found.  There have been multiple studies comparing NOACs to aspirin in this clinical scenario which have not shown any benefit"  Would recommend discontinuing Eliquis as no clear indication for continued use and continue aspirin 81 mg daily for secondary stroke prevention measures.  She has follow-up with cardiology next week to establish care and monitoring of ILR.  If at some point ILR does show evidence of atrial fibrillation, will then be recommended to be on Eliquis but currently not indicated.

## 2021-11-28 ENCOUNTER — Telehealth: Payer: Self-pay

## 2021-11-28 NOTE — Telephone Encounter (Signed)
   Telephone encounter was:  Successful.  11/28/2021 Name: Maria Porter MRN: [id number] DOB: 11/24/1975  Maria Porter is a 46 y.o. year old female who is a primary care patient of Katsadouros, Heidi Dach, MD . The community resource team was consulted for assistance with Transportation Needs   Care guide performed the following interventions: Patient provided with information about care guide support team and interviewed to confirm resource needs.Follow up for mailed resources, the Patient did receive the resources   Follow Up Plan:  No further follow up planned at this time. The patient has been provided with needed resources.    Lenard Forth Care Guide, Embedded Care Coordination [facility], Care Management  [phone] [address] [place], [place], [place] [address] Phone: [phone] Email: [email or web address]

## 2021-11-29 NOTE — Therapy (Unsigned)
OUTPATIENT SPEECH LANGUAGE PATHOLOGY TREATMENT   [name] MRN: [id number] DOB:Dec 12, 1975, 46 y.o., female Today's Date: 11/30/2021  PCP: Maria Agee, MD REFERRING PROVIDER: Tyson Alias, MD   End of Session - 11/30/21 1434     Visit Number 2    Number of Visits 5    Date for SLP Re-Evaluation 01/18/22    Authorization Type [employer] Medicaid - pt has 14 visits remaining for 2023 for combined disciplines    SLP Start Time 1445    SLP Stop Time  1527    SLP Time Calculation (min) 42 min    Activity Tolerance Patient tolerated treatment well              Past Medical History:  Diagnosis Date   Acute deep vein thrombosis of right iliac vein (HCC) 09/14/2020   Stroke [facility])    Past Surgical History:  Procedure Laterality Date   BUBBLE STUDY  01/29/2020   Procedure: BUBBLE STUDY;  Surgeon: Maria Ishikawa, MD;  Location: [facility] ENDOSCOPY;  Service: Cardiovascular;;   TEE WITHOUT CARDIOVERSION N/A 01/29/2020   Procedure: TRANSESOPHAGEAL ECHOCARDIOGRAM (TEE);  Surgeon: Maria Ishikawa, MD;  Location: [facility] ENDOSCOPY;  Service: Cardiovascular;  Laterality: N/A;   Patient Active Problem List   Diagnosis Date Noted   GERD (gastroesophageal reflux disease) 07/20/2021   Right shoulder pain 05/23/2021   Healthcare maintenance 03/07/2021   History of DVT (deep vein thrombosis) 10/06/2020   Family history of premature coronary heart disease 09/14/2020   OSA (obstructive sleep apnea) 09/14/2020   Pre-diabetes 02/03/2020   Subclinical hypothyroidism 02/03/2020   Tobacco use disorder 01/27/2020   Hyperlipemia 01/27/2020   Obesity 01/27/2020   History of CVA with residual deficit 01/26/2020   Extrinsic asthma 10/09/2004    ONSET DATE:  referral date 09/20/2021  REFERRING DIAG: I69.30 (ICD-10-CM) - History of CVA with residual deficit   THERAPY DIAG:  Aphasia  Dysarthria and anarthria  Verbal apraxia  Rationale for Evaluation and  Treatment Rehabilitation  SUBJECTIVE:   SUBJECTIVE STATEMENT: "I've been good"  Pt accompanied by: self  PAIN:  Are you having pain? No  OBJECTIVE:   TODAY'S TREATMENT:  11-30-21: Pt reports being able to talk to driver today on way to therapy. Pt reports she is speaking more, as son is visiting. Pt able to teach back description strategy for anomia. Requires additional education on self-cueing with first letter which was successful in initial ST session. Targeted word finding, verbal apraxia, sentence generation with Semantic Feature Analysis. Given target words, pt able to generate accurate response for 10/12 features for x2 nouns, 12/12 features for x2 verbs with occasional mod-A faded to rare min-A. Increased cueing required for sequencing in "how" portion of verb SFA. Requires SLP cueing for anomia strategy usage when overt word finding occurs. Initial letter cueing intermittently successful this date.   PATIENT EDUCATION: Education details: evaluation results, SLP recommendations, POC/goals Person educated: Patient Education method: Explanation, Demonstration, Verbal cues, and Handouts Education comprehension: verbalized understanding, returned demonstration, and needs further education   GOALS: Goals reviewed with patient? Yes  SHORT TERM GOALS: Target date: 12/21/2021  Pt will successfully utilize trained anomia compensations in structured language tasks, given occasional min-A in 75% of opportunities over 2 sessions.  Baseline: not using any compensations, avoiding communication Goal status: IN PROGRESS  2.  Pt will name 10 items in x3 personally relevant categories over 2 sessions with occasional min-A.  Baseline: x5 items during divergent naming task Goal status: IN  PROGRESS  3.  Pt will identify x2 opportunities to speak to people outside of typically talked to family members, and have brief conversational exchange, self-reporting a score of 2/5 for communication  effectiveness.  Baseline: 0/5 self-rating for efficacy when talking to people outside of immediate family members and best friend  Goal status: IN PROGRESS   LONG TERM GOALS: Target date: 01/18/2022  Pt will successfully utilize trained anomia compensations in 15 minute moderately complex conversational exchange in 75% of opportunities, given rare min-A.  Baseline: not using anomia compensations Goal status: IN PROGRESS  2.  Pt will improve her score on the Communication Participation Item Bank short form by 2 points  Baseline: initial score of 0 Goal status: IN PROGRESS  3.  Pt will report carryover of anomia compensations, resulting in subjective feeling if improved communication effectiveness while out in community, over 1 week period.  Baseline: avoiding conversations, ID talking while out in community as greatest challenge Goal status: IN PROGRESS   ASSESSMENT:  CLINICAL IMPRESSION: Patient is a 46 y.o. F who was seen today for aphasia, in presence of comorbid foreign accent disorder and dysarthria, following multiple strokes. [name] reports to significant impact of aphasia on her quality of life. She is avoiding communication aside from talking to children and best friend, Maria Porter. Not currently employing any anomia compensations, aside from giving self extended periods of time, with is only intermittently successful in ultimate relaying of desired message. Based on evaluation results, detailed above, pt presents with moderate aphasia which is characterized by frequent anomia which increases with increasing of complexity and frequent demonstration of vague speech which impacts cohesive discourse abilities. Dynamic assessment demonstrates pt is stimulible to self-cueing to compensate for word level anomia occurences with min-A. Motor speech is affected by imprecise articulation which pt appears to be compensating for effectively using slowed rate and over-articulation. Pt would benefit  from skilled ST to address aphasia to enable effective communication with friends, family members, and community members. Unfortunately, pt has limited visits remaining for year so therapy plan has been modified to accommodate that limitation despite likelihood that pt would benefit from more frequent and extended therapy course. Will compensate for limitation by proving robust HEP. Pt in agreement with POC.   OBJECTIVE IMPAIRMENTS include expressive language, aphasia, and dysarthria. These impairments are limiting patient from return to work and effectively communicating at home and in community. Factors affecting potential to achieve goals and functional outcome are previous level of function, financial resources, and family/community support. Patient will benefit from skilled SLP services to address above impairments and improve overall function.  REHAB POTENTIAL: Fair limited visits available  PLAN: SLP FREQUENCY: 1x/week  SLP DURATION: 8 weeks  PLANNED INTERVENTIONS: Language facilitation, Cueing hierachy, Internal/external aids, Functional tasks, Multimodal communication approach, SLP instruction and feedback, Compensatory strategies, and Patient/family education   Managed medicaid CPT codes: 24235 - SLP treatment    Maia Breslow, CCC-SLP 11/30/2021, 3:27 PM

## 2021-11-30 ENCOUNTER — Ambulatory Visit: Payer: Medicaid Other | Admitting: Occupational Therapy

## 2021-11-30 ENCOUNTER — Ambulatory Visit: Payer: Medicaid Other | Admitting: Physical Therapy

## 2021-11-30 ENCOUNTER — Ambulatory Visit: Payer: Medicaid Other | Admitting: Speech Pathology

## 2021-11-30 ENCOUNTER — Encounter: Payer: Self-pay | Admitting: Occupational Therapy

## 2021-11-30 VITALS — BP 117/86 | HR 98

## 2021-11-30 DIAGNOSIS — R482 Apraxia: Secondary | ICD-10-CM

## 2021-11-30 DIAGNOSIS — M6281 Muscle weakness (generalized): Secondary | ICD-10-CM

## 2021-11-30 DIAGNOSIS — R2681 Unsteadiness on feet: Secondary | ICD-10-CM

## 2021-11-30 DIAGNOSIS — R471 Dysarthria and anarthria: Secondary | ICD-10-CM

## 2021-11-30 DIAGNOSIS — R2689 Other abnormalities of gait and mobility: Secondary | ICD-10-CM

## 2021-11-30 DIAGNOSIS — R4701 Aphasia: Secondary | ICD-10-CM

## 2021-11-30 DIAGNOSIS — M25611 Stiffness of right shoulder, not elsewhere classified: Secondary | ICD-10-CM

## 2021-11-30 DIAGNOSIS — G8929 Other chronic pain: Secondary | ICD-10-CM

## 2021-11-30 DIAGNOSIS — I69353 Hemiplegia and hemiparesis following cerebral infarction affecting right non-dominant side: Secondary | ICD-10-CM

## 2021-11-30 NOTE — Therapy (Signed)
OUTPATIENT PHYSICAL THERAPY NEURO TREATMENT   [name] MRN: [id number] DOB:07-Aug-1975, 46 y.o., female Today's Date: 11/30/2021   PCP: Riesa Pope, MD REFERRING PROVIDER: Barbie Haggis, FNP    PT End of Session - 11/30/21 1408     Visit Number 2    Number of Visits 5   4+eval   Date for PT Re-Evaluation 01/19/22   pushed out due to scheduling multi-D appts   Authorization Type [facility] Medicaid - pt has 14 visits remaining for 2023 for combined disciplines    PT Start Time 1402    PT Stop Time 1447    PT Time Calculation (min) 45 min    Equipment Utilized During Treatment Gait belt    Activity Tolerance Patient tolerated treatment well    Behavior During Therapy [facility] for tasks assessed/performed              Past Medical History:  Diagnosis Date   Acute deep vein thrombosis of right iliac vein ([place]) 09/14/2020   Stroke [facility])    Past Surgical History:  Procedure Laterality Date   BUBBLE STUDY  01/29/2020   Procedure: BUBBLE STUDY;  Surgeon: [name], MD;  Location: [place];  Service: Cardiovascular;;   TEE WITHOUT CARDIOVERSION N/A 01/29/2020   Procedure: TRANSESOPHAGEAL ECHOCARDIOGRAM (TEE);  Surgeon: [name], MD;  Location: [facility] ENDOSCOPY;  Service: Cardiovascular;  Laterality: N/A;   Patient Active Problem List   Diagnosis Date Noted   GERD (gastroesophageal reflux disease) 07/20/2021   Right shoulder pain 05/23/2021   Healthcare maintenance 03/07/2021   History of DVT (deep vein thrombosis) 10/06/2020   Family history of premature coronary heart disease 09/14/2020   OSA (obstructive sleep apnea) 09/14/2020   Pre-diabetes 02/03/2020   Subclinical hypothyroidism 02/03/2020   Tobacco use disorder 01/27/2020   Hyperlipemia 01/27/2020   Obesity 01/27/2020   History of CVA with residual deficit 01/26/2020   Extrinsic asthma 10/09/2004    ONSET DATE: 09/20/2021 (referral following most recent CVA in  May)  REFERRING DIAG: Z78.9 (ICD-10-CM) - Other specified health status Z86.718 (ICD-10-CM) - Personal history of other venous thrombosis and embolism F14.91 (ICD-10-CM) - Cocaine use, unspecified, in remission Z74.09 (ICD-10-CM) - Impaired mobility I63.9 (ICD-10-CM) - Cerebral infarction, unspecified I63.541 (ICD-10-CM) - Cerebral infarction due to unspecified occlusion or stenosis of right cerebellar artery Z78.9 (ICD-10-CM) - Decreased independence with activities of daily living   THERAPY DIAG:  Muscle weakness (generalized)  Unsteadiness on feet  Other abnormalities of gait and mobility  Rationale for Evaluation and Treatment Rehabilitation  SUBJECTIVE:  SUBJECTIVE STATEMENT: Pt reports things are going well, no changes since last visit.   Pt accompanied by: self  PERTINENT HISTORY: DVT 09/2020 on eliquis, financial strain, left PICA CVA, left medial cerebellar and frontal/subcortical CVAs, left MCA occlusion w/ hemiparesis (01/27/2020), smoking, right occipital CVA w/ concurrent right superior cerebellar artery infarct 09/2021, cocaine use, hyperlipidemia  PAIN:  Are you having pain? No   VITALS Vitals:   11/30/21 1409  BP: 117/86  Pulse: 98     PRECAUTIONS: Fall  PATIENT GOALS "To have a cane."  OBJECTIVE:   COGNITION: Overall cognitive status: Impaired and pt states her processing speed is longer now and it takes her longer to figure out how to do things and then to actually do them, she uses a journal to write down reminders because she cannot always say them well.   SENSATION: Light touch: WFL Hot/Cold: Impaired -she has burned her RUE (hand and wrist) when cooking because she will bump into the inside of the oven putting in or taking something out and not realize it is hot until  she has a burn (noted several small scars that appear to be healed)   TODAY'S TREATMENT:  Gait Training  Gait pattern:  ER of RLE, step through pattern, decreased arm swing- Right, decreased step length- Left, decreased stance time- Right, decreased hip/knee flexion- Right, and decreased ankle dorsiflexion- Right Distance walked: 230',  Assistive device utilized: Single point cane Level of assistance: SBA Comments: Practiced ambulating around clinic w/SPC on R side, but pt unable to place cane properly or use safely. Min cues to hold cane on L side and pt demonstrated increased stability and sequencing. Pt fatigued very quickly, reporting 5/10 fatigue following 230' and requesting seated rest break.   Ther Ex  Established and demonstrated initial HEP for increased BLE strength and endurance:  - Sit to Stand with Red Resistance Around Legs  - 2x10 reps, min cues to reduce bracing hands against legs throughout  - Standing Hip Abduction with Counter Support, x10 per side  -Standing Hip Extension with Counter Support, x10 per side   SciFit multi-peaks level 6 for 8 minutes using BUE/BLEs for neural priming for reciprocal movement, dynamic cardiovascular warmup and global strength. Noted difficulty holding onto handle w/RUE initially, requiring significant compensation strategies RPE of 7/10 following activity.    PATIENT EDUCATION: Education details: Initial HEP and walking program  Person educated: Patient Education method: Explanation and Handouts Education comprehension: verbalized understanding   HOME EXERCISE PROGRAM:  Access Code: [email or web address] URL: [email or web address] Date: 11/30/2021 Prepared by: Mickie Bail Eleesha Purkey  Program Notes Walking Program -Start by walking for 3 minutes at a time with your cane on the left side, rest for 2 minutes. Repeat x3 -Add one minute each week (walk for 4 minutes, then 5 minutes, etc.)   Exercises - Sit to Stand with Resistance Around  Legs  - 1 x daily - 7 x weekly - 3 sets - 10 reps - Standing Hip Abduction with Counter Support  - 1 x daily - 7 x weekly - 3 sets - 10 reps - Standing Hip Extension with Counter Support  - 1 x daily - 7 x weekly - 3 sets - 10 reps  GOALS: Goals reviewed with patient? Yes  SHORT TERM GOALS: Target date: 12/15/2021  Pt will be independent with strength and balance HEP. Baseline:  To be established. Goal status: INITIAL  2.  Gait training w/ SPC will be initiated to assess for pt safety with ambulatory  distances <200 feet. Baseline: To be initiated. Pt amb. 230' w/SPC on L side w/SBA on 7/27  Goal status: MET  LONG TERM GOALS: Target date: 01/05/2022  Pt will adhere to walking program 2-3 days per week using LRAD to promote aerobic gains and general wellness. Baseline: Sedentary due to mobility limitations. Goal status: INITIAL  2.  Pt will decrease 5xSTS to </=15 seconds w/ BUE support on chair in order to demonstrate decreased risk for falls and improved functional bilateral LE strength and power. Baseline: 24.07 sec w/ BUE on RW Goal status: INITIAL  3.  Pt will demonstrate a gait speed of >/=2.60 feet/sec w/ LRAD in order to decrease risk for falls. Baseline: 2.20 ft/sec w/ RW Goal status: INITIAL  4.  Pt will ambulate >/= 350' safely with LRAD to promote household and limited community distances and activity tolerance. Baseline: various clinic distances, reports inability to ambulate > 36mins Goal status: INITIAL  ASSESSMENT:  CLINICAL IMPRESSION: Emphasis of skilled PT session on establishing initial HEP and gait training w/SPC. Pt demonstrates safe ambulation when holding SPC on L side vs R side, pt verbalized agreement that she is more stable w/cane on L side. Pt able to demonstrate HEP well and verbalized understanding to prioritize walking program and endurance. Continue POC.   OBJECTIVE IMPAIRMENTS Abnormal gait, decreased activity tolerance, decreased balance, decreased  cognition, decreased coordination, decreased endurance, decreased knowledge of use of DME, decreased mobility, decreased strength, decreased safety awareness, impaired sensation, impaired tone, and impaired UE functional use.   ACTIVITY LIMITATIONS carrying, lifting, bending, standing, stairs, and locomotion level  PARTICIPATION LIMITATIONS: cleaning, driving, and community activity  PERSONAL FACTORS Fitness, Past/current experiences, Social background, Time since onset of injury/illness/exacerbation, Transportation, and 3+ comorbidities: hx of recurrent CVAs, DVT risk, smoking, cocaine use, hyperlipidemia, and financial strain  are also affecting patient's functional outcome.   REHAB POTENTIAL: Fair See personal factors  CLINICAL DECISION MAKING: Evolving/moderate complexity  EVALUATION COMPLEXITY: Moderate  PLAN: PT FREQUENCY: 1x/week  PT DURATION: 8 weeks-will not use every week due to visit limit  PLANNED INTERVENTIONS: Therapeutic exercises, Therapeutic activity, Neuromuscular re-education, Balance training, Gait training, Patient/Family education, Self Care, Stair training, Vestibular training, DME instructions, and Re-evaluation  PLAN FOR NEXT SESSION: Continue gait training w/SPC vs no AD, add to HEP for strength/static/dynamic balance, SciFit/NuStep for endurance, eccentric heel taps, treadmill?   Managed medicaid CPT codes: [phone] - PT Re-evaluation, [phone]- Therapeutic Exercise, [phone]- Neuro Re-education, [phone] - Gait Training, [phone] - Therapeutic Activities, and [phone] - [place] E Esli Jernigan, PT, DPT 11/30/2021, 2:48 PM

## 2021-11-30 NOTE — Therapy (Signed)
OUTPATIENT OCCUPATIONAL THERAPY TREATMENT NOTE   [name] MRN: [id number] DOB:13-Apr-1976, 46 y.o., female Today's Date: 11/30/2021  PCP:  Riesa Pope, MD  REFERRING PROVIDER:  Riesa Pope, MD   END OF SESSION:   OT End of Session - 11/30/21 1419     Visit Number 2    Number of Visits 5    Date for OT Re-Evaluation 01/17/22    Authorization Type [employer] Medicaid    Authorization Time Period VL: 27 combined - used 13 at time of Eval    OT Start Time 1322    OT Stop Time 1400    OT Time Calculation (min) 38 min    Activity Tolerance Patient tolerated treatment well    Behavior During Therapy [facility] for tasks assessed/performed             Past Medical History:  Diagnosis Date   Acute deep vein thrombosis of right iliac vein ([place]) 09/14/2020   Stroke [facility])    Past Surgical History:  Procedure Laterality Date   BUBBLE STUDY  01/29/2020   Procedure: BUBBLE STUDY;  Surgeon: [name], MD;  Location: [place];  Service: Cardiovascular;;   TEE WITHOUT CARDIOVERSION N/A 01/29/2020   Procedure: TRANSESOPHAGEAL ECHOCARDIOGRAM (TEE);  Surgeon: [name], MD;  Location: [place];  Service: Cardiovascular;  Laterality: N/A;   Patient Active Problem List   Diagnosis Date Noted   GERD (gastroesophageal reflux disease) 07/20/2021   Right shoulder pain 05/23/2021   Healthcare maintenance 03/07/2021   History of DVT (deep vein thrombosis) 10/06/2020   Family history of premature coronary heart disease 09/14/2020   OSA (obstructive sleep apnea) 09/14/2020   Pre-diabetes 02/03/2020   Subclinical hypothyroidism 02/03/2020   Tobacco use disorder 01/27/2020   Hyperlipemia 01/27/2020   Obesity 01/27/2020   History of CVA with residual deficit 01/26/2020   Extrinsic asthma 10/09/2004    ONSET DATE: referral date 09/20/2021   REFERRING DIAG: Z78.9 (ICD-10-CM) - Decreased independence with activities of daily  living I63.541 (ICD-10-CM) - Cerebral infarction due to unspecified occlusion or stenosis of right cerebellar artery  THERAPY DIAG:  Muscle weakness (generalized)  Chronic right shoulder pain  Stiffness of right shoulder, not elsewhere classified  Hemiplegia and hemiparesis following cerebral infarction affecting right non-dominant side ([facility])  Unsteadiness on feet  Rationale for Evaluation and Treatment Rehabilitation  PERTINENT HISTORY: CVA 09/11/20   PMH:  hx of prior CVA (approx 4 yrs ago affecting L side, 01/2020 affecting R side, and 09/11/20 affecting R side); obesity, hyperlipidemia, sleep apnea    PRECAUTIONS:  Fall, Loop Recorder, Aphasia   SUBJECTIVE: I can't do things like before with my right hand  PAIN:  Are you having pain? No     OBJECTIVE: HAND DOMINANCE: Left   ADLs: Overall ADLs: mod I right now Equipment: Transfer tub bench     IADLs: Shopping: online Light housekeeping: daughter completing laundry Meal Prep: completing cooking Community mobility: relies on transportation Medication management: independent Financial management: independent Handwriting:  reports no changes.   MOBILITY STATUS: Needs Assist: uses rolling walker (2 wheel) with SBA     FUNCTIONAL OUTCOME MEASURES: FOTO: did not complete at check in   UE ROM      Active ROM Right 09/04/2021 Left 09/04/2021  Shoulder flexion 60* PROM No active noted [facility]  Shoulder abduction     Shoulder adduction     Shoulder extension     Shoulder internal rotation     Shoulder external rotation  Elbow flexion Active movement noted at evaluation   Elbow extension    Wrist flexion     Wrist extension     Wrist ulnar deviation     Wrist radial deviation     Wrist pronation     Wrist supination     (Blank rows = not tested)       HAND FUNCTION: Grip strength: Right: 20.9 lbs; Left: 52 lbs and Pt demonstrates 100% composite flexion/extension with BUE   COORDINATION: Finger Nose Finger  test: RUE impaired d/t limited shoulder and dysmetria 9 Hole Peg test: Right: 79.31 sec; Left: 19.66 sec Box and Blocks:  Right 19blocks, Left 57blocks   SENSATION: Pt reports numbness in LUE     MUSCLE TONE: RUE: Severe in RUE shoulder   COGNITION: Overall cognitive status:  aphasia   VISION: Subjective report: denies any changes Baseline vision: Wears glasses for reading only Visual history:  none   VISION ASSESSMENT: Diplopia assessment: reports having diplopia before but has resolved.   PERCEPTION: WFL   PRAXIS: WFL   OBSERVATIONS: Pt with very limited active RUE shoulder flexion and severe tone.   TODAY'S TREATMENT: 11/30/21:   Patient agreeable to OT goals.  Patient excited to have exercises to assist with movement and coordination in RUE.  Patient reports no pain at rest, but pain when attempting supine exercises from prior episode of care.  Patient encouraged to not complete exercises that caused pain.  Patient with significant increase in tension and incorrect over-activation throughout proximal RUE with all attempts at active movement.  Encouraged patient to slide versus lift arm wherever possible.  Worked on use of body on arm movements to reduce muscle overactivation in RUE.  Worked on rolling toward Right side and use of RUE to aide with transition sidelying to sit..  Patient responded well to cueing for less activation.  In sitting worked on weight shifting with arm slide to right.        PATIENT EDUCATION: Education details: Education on role and purpose of OT  Person educated: Patient Education method: Explanation Education comprehension: verbalized understanding     HOME EXERCISE PROGRAM: To Be Established       GOALS: Goals reviewed with patient? No   SHORT TERM GOALS: Target date: 12/20/2021       Status:  1 Pt will be independent with HEPs targeting RUE shoulder range of motion.  Baseline: review previous HEPs and update Initial  2 Pt will  increase range of motion in RUE shoulder flexion by at least 10 degrees of PROM to progress towards ability to reach items at low level. Baseline: 60* PROM Initial          LONG TERM GOALS: Target date: 01/17/2022       Status:  1 Pt will be independent with updated HEPs targeting RUE shoulder ROM and coordination  Baseline: continue to update throughout POC ONGOING  2 Pt will demonstrate active shoulder flexion to at least 15* in RUE to progress towards low level reaching. Baseline: 60* PROM, 0* AROM ONGOING  3 Pt will increase fine motor coordination in RUE by improving 9 hole peg test to completing in 60 seconds or less. Baseline: RUE 79.31s, LUE 19.66s ONGOING  4 Pt will improve function use of RUE by increasing Box and Blocks score by at least 5 blocks Baseline: LUE 57, RUE 19 ONGOING         ASSESSMENT:   CLINICAL IMPRESSION: Patient is eager for less arm pain  and improved functional use of non dominant RUE    PERFORMANCE DEFICITS in functional skills including ADLs, IADLs, coordination, dexterity, tone, ROM, strength, flexibility, FMC, GMC, decreased knowledge of use of DME, and UE functional use, cognitive skills including attention   IMPAIRMENTS are limiting patient from ADLs, IADLs, and leisure.    COMORBIDITIES may have co-morbidities  that affects occupational performance. Patient will benefit from skilled OT to address above impairments and improve overall function.   MODIFICATION OR ASSISTANCE TO COMPLETE EVALUATION: Min-Moderate modification of tasks or assist with assess necessary to complete an evaluation.   OT OCCUPATIONAL PROFILE AND HISTORY: Detailed assessment: Review of records and additional review of physical, cognitive, psychosocial history related to current functional performance.   CLINICAL DECISION MAKING: Moderate - several treatment options, min-mod task modification necessary   REHAB POTENTIAL: Good   EVALUATION COMPLEXITY: Moderate        PLAN: OT FREQUENCY: 1x/week   OT DURATION: 8 weeks   PLANNED INTERVENTIONS: self care/ADL training, therapeutic exercise, therapeutic activity, neuromuscular re-education, manual therapy, passive range of motion, functional mobility training, ultrasound, patient/family education, and DME and/or AE instructions   RECOMMENDED OTHER SERVICES: PT and ST eval today. Recommend both services however patient limited by visit limit   CONSULTED AND AGREED WITH PLAN OF CARE: Patient   PLAN FOR NEXT SESSION: review supine HEP and table slides for shoulder, RUE shoulder range of motion and gravity eliminated planes       Collier Salina, OT 11/30/2021, 2:20 PM

## 2021-11-30 NOTE — Patient Instructions (Signed)
Journal about your day:   Freeform - write about whatever  Or use this template: Three things I'm grateful for  Two things that went well today One thing I want to change for tomorrow

## 2021-12-04 ENCOUNTER — Ambulatory Visit (INDEPENDENT_AMBULATORY_CARE_PROVIDER_SITE_OTHER): Payer: Medicaid Other | Admitting: Cardiovascular Disease

## 2021-12-04 ENCOUNTER — Encounter: Payer: Self-pay | Admitting: Cardiovascular Disease

## 2021-12-04 VITALS — BP 128/90 | HR 103 | Ht 66.0 in | Wt 221.8 lb

## 2021-12-04 DIAGNOSIS — I639 Cerebral infarction, unspecified: Secondary | ICD-10-CM

## 2021-12-04 DIAGNOSIS — Z95818 Presence of other cardiac implants and grafts: Secondary | ICD-10-CM

## 2021-12-04 NOTE — Progress Notes (Signed)
Cardiology Office Note:    Date:  12/05/2021   ID:  Maria Porter, DOB 05-05-1976, MRN [id number]  PCP:  Belva Agee, MD   [facility] Providers Cardiologist:  Maisie Fus, MD     Referring MD: Maisie Fus, MD   Chief Complaint  Patient presents with   cryptogenic stroke   device check   Cerebrovascular Accident  Establish ILR followup  History of Present Illness:    Maria Porter is a 46 y.o. female with a hx of multi-territory embolic strokes in September 2021 and May 2023, history of DVT, s/p ILR (Linq II) implanted in [place], [place]. She is on ASA and Eliquis. PFO present but "too small to repair".  The patient specifically denies any chest pain at rest exertion, dyspnea at rest or with exertion, orthopnea, paroxysmal nocturnal dyspnea, syncope, palpitations,  intermittent claudication, lower extremity edema, unexplained weight gain, cough, hemoptysis or wheezing. She has residual R hemiplegia, uses walker and "foreign accent syndrome".  No new focal neurological deficits.    Past Medical History:  Diagnosis Date   Acute deep vein thrombosis of right iliac vein (HCC) 09/14/2020   Stroke [facility])     Past Surgical History:  Procedure Laterality Date   BUBBLE STUDY  01/29/2020   Procedure: BUBBLE STUDY;  Surgeon: Little Ishikawa, MD;  Location: [facility] [place] ENDOSCOPY;  Service: Cardiovascular;;   TEE WITHOUT CARDIOVERSION N/A 01/29/2020   Procedure: TRANSESOPHAGEAL ECHOCARDIOGRAM (TEE);  Surgeon: Little Ishikawa, MD;  Location: [facility] ENDOSCOPY;  Service: Cardiovascular;  Laterality: N/A;    Current Medications: Current Meds  Medication Sig   albuterol (PROVENTIL) (2.5 MG/3ML) 0.083% nebulizer solution Take 3 mLs (2.5 mg total) by nebulization every 4 (four) hours as needed for wheezing or shortness of breath.   aspirin EC 81 MG tablet Take 1 tablet (81 mg total) by mouth daily. Swallow whole.   atorvastatin (LIPITOR) 80 MG tablet Take 1 tablet (80  mg total) by mouth daily.   cetirizine (ZYRTEC) 10 MG tablet Take 10 mg by mouth daily.   ezetimibe (ZETIA) 10 MG tablet Take 1 tablet (10 mg total) by mouth daily.   fluticasone (FLONASE) 50 MCG/ACT nasal spray Place 1 spray into both nostrils daily.   levothyroxine (SYNTHROID) 150 MCG tablet Take 1 tablet (150 mcg total) by mouth daily.   ondansetron (ZOFRAN) 4 MG tablet Take 1 tablet (4 mg total) by mouth every 8 (eight) hours as needed for nausea or vomiting.   pantoprazole (PROTONIX) 40 MG tablet Take 1 tablet (40 mg total) by mouth daily.   VENTOLIN HFA 108 (90 Base) MCG/ACT inhaler INHALE 1-2 PUFFS BY MOUTH EVERY 6 HOURS AS NEEDED FOR WHEEZE OR SHORTNESS OF BREATH     Allergies:   Patient has no known allergies.   Social History   Socioeconomic History   Marital status: Single    Spouse name: Not on file   Number of children: Not on file   Years of education: Not on file   Highest education level: Not on file  Occupational History   Not on file  Tobacco Use   Smoking status: Every Day    Packs/day: 0.50    Types: Cigarettes    Start date: 03/07/2020    Last attempt to quit: 08/30/2021    Years since quitting: 0.2   Smokeless tobacco: Never   Tobacco comments:    1 pk per day.   Stopped x 1 month age    12/04/2021 patient smokes 4  cigarettes daily  Substance and Sexual Activity   Alcohol use: Yes   Drug use: Not on file   Sexual activity: Not on file  Other Topics Concern   Not on file  Social History Narrative   Lives with sister   Left Handed   Drinks 2-3 cups caffeine daily   Social Determinants of Health   Financial Resource Strain: High Risk (11/15/2021)   Overall Financial Resource Strain (CARDIA)    Difficulty of Paying Living Expenses: Very hard  Food Insecurity: Food Insecurity Present (11/15/2021)   Hunger Vital Sign    Worried About Running Out of Food in the Last Year: Often true    Ran Out of Food in the Last Year: Often true  Transportation Needs:  Not on file  Physical Activity: Not on file  Stress: Not on file  Social Connections: Not on file     Family History: The patient's family history is negative for Sleep apnea. Or stroke  ROS:   Please see the history of present illness.     All other systems reviewed and are negative.  EKGs/Labs/Other Studies Reviewed:    The following studies were reviewed today: ILR download performed in the office shows that since device implantation there have been no episodes of atrial fibrillation or any other arrhythmic events.  Notes from [place]  TEE 01/09/2020  1. Left ventricular ejection fraction, by estimation, is 55 to 60%. The left ventricle has normal function.   2. Right ventricular systolic function is normal. The right ventricular size is normal.   3. No left atrial/left atrial appendage thrombus was detected.   4. The mitral valve is normal in structure. Trivial mitral valve  regurgitation.   5. The aortic valve is tricuspid. Aortic valve regurgitation is not visualized.   6. Agitated saline contrast bubble study was positive with shunting observed within 3-6 cardiac cycles suggestive of interatrial shunt.   Conclusion(s)/Recommendation(s): Positive bubble study consistent with PFO.   I reviewed the images and disagree with the findings.  I think the first bubbles of saline contrast appear in the left atrium after roughly 8 beats likely due to pulmonary circulation rather than a shunt.  There is no evidence of PFO or ASD by 2D or color Doppler imaging.   EKG:  EKG is not ordered today.  The ekg ordered 11/21/2021 demonstrates NSR, deep but sharp lateral Q waves  Recent Labs: 05/23/2021: BUN 9; Creatinine, Ser 0.81; Potassium 4.4; Sodium 141  Recent Lipid Panel    Component Value Date/Time   CHOL 181 01/27/2020 0500   TRIG 69 01/27/2020 0500   HDL 42 01/27/2020 0500   CHOLHDL 4.3 01/27/2020 0500   VLDL 14 01/27/2020 0500   LDLCALC 125 (H) 01/27/2020 0500     Risk  Assessment/Calculations:           Physical Exam:    VS:  BP 128/90 (BP Location: Left Arm, Patient Position: Sitting, Cuff Size: Large)   Pulse (!) 103   Ht 5\' 6"  (1.676 m)   Wt 221 lb 12.8 oz (100.6 kg)   SpO2 94%   BMI 35.80 kg/m     Wt Readings from Last 3 Encounters:  12/04/21 221 lb 12.8 oz (100.6 kg)  11/14/21 221 lb 9.6 oz (100.5 kg)  11/14/21 218 lb 6 oz (99.1 kg)     GEN: Obese, Well nourished, well developed in no acute distress HEENT: Normal NECK: No JVD; No carotid bruits LYMPHATICS: No lymphadenopathy CARDIAC: Healthy ILR site.  RRR, no murmurs, rubs, gallops RESPIRATORY:  Clear to auscultation without rales, wheezing or rhonchi  ABDOMEN: Soft, non-tender, non-distended MUSCULOSKELETAL:  No edema; No deformity  SKIN: Warm and dry NEUROLOGIC:  Alert and oriented x 3 PSYCHIATRIC:  Normal affect   ASSESSMENT:    1. Cryptogenic stroke ([place])   2. Implantable loop recorder present    PLAN:    In order of problems listed above:  Cryptogenic stroke: Unexplained.  To date we have not recorded atrial fibrillation in 2 months of loop recorder monitoring.  I disagree with the evaluation of the TEE performed in September 2021.  I do not think there is evidence of a PFO. ILR: normal device function, enroll in device clinic           Medication Adjustments/Labs and Tests Ordered: Current medicines are reviewed at length with the patient today.  Concerns regarding medicines are outlined above.  No orders of the defined types were placed in this encounter.  No orders of the defined types were placed in this encounter.   Patient Instructions  Medication Instructions:  No changes *If you need a refill on your cardiac medications before your next appointment, please call your pharmacy*   Lab Work: None ordered If you have labs (blood work) drawn today and your tests are completely normal, you will receive your results only by: [place] (if you have  MyChart) OR A paper copy in the mail If you have any lab test that is abnormal or we need to change your treatment, we will call you to review the results.   Testing/Procedures: None ordered   Follow-Up: At [facility], you and your health needs are our priority.  As part of our continuing mission to provide you with exceptional heart care, we have created designated Provider Care Teams.  These Care Teams include your primary Cardiologist (physician) and Advanced Practice Providers (APPs -  Physician Assistants and Nurse Practitioners) who all work together to provide you with the care you need, when you need it.  We recommend signing up for the patient portal called "MyChart".  Sign up information is provided on this After Visit Summary.  MyChart is used to connect with patients for Virtual Visits (Telemedicine).  Patients are able to view lab/test results, encounter notes, upcoming appointments, etc.  Non-urgent messages can be sent to your provider as well.   To learn more about what you can do with MyChart, go to [email or web address].    Your next appointment:   Keep follow up with Dr. Harl Bowie as planned  Important Information About Sugar         Signed, Sanda Klein, MD  12/05/2021 5:35 PM    [place]

## 2021-12-04 NOTE — Patient Instructions (Signed)
Medication Instructions:  No changes *If you need a refill on your cardiac medications before your next appointment, please call your pharmacy*   Lab Work: None ordered If you have labs (blood work) drawn today and your tests are completely normal, you will receive your results only by: MyChart Message (if you have MyChart) OR A paper copy in the mail If you have any lab test that is abnormal or we need to change your treatment, we will call you to review the results.   Testing/Procedures: None ordered   Follow-Up: At [facility], you and your health needs are our priority.  As part of our continuing mission to provide you with exceptional heart care, we have created designated Provider Care Teams.  These Care Teams include your primary Cardiologist (physician) and Advanced Practice Providers (APPs -  Physician Assistants and Nurse Practitioners) who all work together to provide you with the care you need, when you need it.  We recommend signing up for the patient portal called "MyChart".  Sign up information is provided on this After Visit Summary.  MyChart is used to connect with patients for Virtual Visits (Telemedicine).  Patients are able to view lab/test results, encounter notes, upcoming appointments, etc.  Non-urgent messages can be sent to your provider as well.   To learn more about what you can do with MyChart, go to [email or web address].    Your next appointment:   Keep follow up with Dr. Wyline Mood as planned  Important Information About Sugar

## 2021-12-05 ENCOUNTER — Telehealth: Payer: Self-pay

## 2021-12-05 ENCOUNTER — Ambulatory Visit: Payer: Medicaid Other | Admitting: Physical Therapy

## 2021-12-05 ENCOUNTER — Encounter: Payer: Medicaid Other | Admitting: Speech Pathology

## 2021-12-05 NOTE — Telephone Encounter (Signed)
I put the patient in [facility] and requested her release in [facility]. I will call her old clinic 12/06/2021 to ask them to release her to [place].

## 2021-12-05 NOTE — Telephone Encounter (Signed)
-----   Message from Lenor Coffin, RN sent at 12/04/2021 10:40 AM EDT -----  ----- Message ----- From: Sandi Mariscal, RN Sent: 12/04/2021  10:37 AM EDT To: [facility]  The patient is a new patient. Dr. Royann Shivers will be monitoring her loop recorder downloads. Can she be set up for monthly downloads, please. We did one today which we will have scanned.  Thanks, [facility]   Implantable loop recorder: Medtronic Linq II Model [id number], Serial# [id number] Implant Date: 09/04/21 Sensitivity: 0.035 mV  Parrameters: VT: 370 ms 24 beats VFT: 300 ms 18/24 beats Brady: 2000 ms 4 beats Asystole: 3.0 s AT/AF Detections: AT/AF

## 2021-12-06 ENCOUNTER — Other Ambulatory Visit: Payer: Self-pay | Admitting: Student

## 2021-12-06 DIAGNOSIS — I6930 Unspecified sequelae of cerebral infarction: Secondary | ICD-10-CM | POA: Diagnosis not present

## 2021-12-07 ENCOUNTER — Encounter: Payer: Medicaid Other | Admitting: Occupational Therapy

## 2021-12-07 ENCOUNTER — Ambulatory Visit: Payer: Medicaid Other | Admitting: Physical Therapy

## 2021-12-07 ENCOUNTER — Encounter: Payer: Medicaid Other | Admitting: Speech Pathology

## 2021-12-07 DIAGNOSIS — I6930 Unspecified sequelae of cerebral infarction: Secondary | ICD-10-CM | POA: Diagnosis not present

## 2021-12-08 DIAGNOSIS — I6930 Unspecified sequelae of cerebral infarction: Secondary | ICD-10-CM | POA: Diagnosis not present

## 2021-12-09 ENCOUNTER — Other Ambulatory Visit: Payer: Self-pay

## 2021-12-09 ENCOUNTER — Emergency Department (HOSPITAL_COMMUNITY): Payer: Medicaid Other

## 2021-12-09 ENCOUNTER — Encounter (HOSPITAL_COMMUNITY): Payer: Self-pay | Admitting: Emergency Medicine

## 2021-12-09 ENCOUNTER — Inpatient Hospital Stay (HOSPITAL_COMMUNITY)
Admission: EM | Admit: 2021-12-09 | Discharge: 2021-12-12 | DRG: 065 | Disposition: A | Discharge status: Home / Self Care | Payer: Medicaid Other | Attending: Internal Medicine | Admitting: Internal Medicine

## 2021-12-09 DIAGNOSIS — Z7982 Long term (current) use of aspirin: Secondary | ICD-10-CM

## 2021-12-09 DIAGNOSIS — G4489 Other headache syndrome: Secondary | ICD-10-CM | POA: Diagnosis not present

## 2021-12-09 DIAGNOSIS — I639 Cerebral infarction, unspecified: Secondary | ICD-10-CM | POA: Diagnosis not present

## 2021-12-09 DIAGNOSIS — Z6835 Body mass index (BMI) 35.0-35.9, adult: Secondary | ICD-10-CM

## 2021-12-09 DIAGNOSIS — G4733 Obstructive sleep apnea (adult) (pediatric): Secondary | ICD-10-CM | POA: Diagnosis present

## 2021-12-09 DIAGNOSIS — Z79899 Other long term (current) drug therapy: Secondary | ICD-10-CM

## 2021-12-09 DIAGNOSIS — I63511 Cerebral infarction due to unspecified occlusion or stenosis of right middle cerebral artery: Principal | ICD-10-CM

## 2021-12-09 DIAGNOSIS — Z8673 Personal history of transient ischemic attack (TIA), and cerebral infarction without residual deficits: Secondary | ICD-10-CM

## 2021-12-09 DIAGNOSIS — I10 Essential (primary) hypertension: Secondary | ICD-10-CM | POA: Diagnosis not present

## 2021-12-09 DIAGNOSIS — Z86718 Personal history of other venous thrombosis and embolism: Secondary | ICD-10-CM

## 2021-12-09 DIAGNOSIS — Z66 Do not resuscitate: Secondary | ICD-10-CM | POA: Diagnosis present

## 2021-12-09 DIAGNOSIS — Z20822 Contact with and (suspected) exposure to covid-19: Secondary | ICD-10-CM | POA: Diagnosis present

## 2021-12-09 DIAGNOSIS — E038 Other specified hypothyroidism: Secondary | ICD-10-CM | POA: Diagnosis present

## 2021-12-09 DIAGNOSIS — Z79890 Hormone replacement therapy (postmenopausal): Secondary | ICD-10-CM

## 2021-12-09 DIAGNOSIS — I6930 Unspecified sequelae of cerebral infarction: Secondary | ICD-10-CM

## 2021-12-09 DIAGNOSIS — E785 Hyperlipidemia, unspecified: Secondary | ICD-10-CM | POA: Diagnosis present

## 2021-12-09 DIAGNOSIS — I69351 Hemiplegia and hemiparesis following cerebral infarction affecting right dominant side: Secondary | ICD-10-CM

## 2021-12-09 DIAGNOSIS — F17210 Nicotine dependence, cigarettes, uncomplicated: Secondary | ICD-10-CM | POA: Diagnosis present

## 2021-12-09 DIAGNOSIS — R7303 Prediabetes: Secondary | ICD-10-CM | POA: Diagnosis present

## 2021-12-09 DIAGNOSIS — I69992 Facial weakness following unspecified cerebrovascular disease: Secondary | ICD-10-CM

## 2021-12-09 DIAGNOSIS — R29702 NIHSS score 2: Secondary | ICD-10-CM | POA: Diagnosis present

## 2021-12-09 DIAGNOSIS — Z8249 Family history of ischemic heart disease and other diseases of the circulatory system: Secondary | ICD-10-CM

## 2021-12-09 DIAGNOSIS — R471 Dysarthria and anarthria: Secondary | ICD-10-CM | POA: Diagnosis present

## 2021-12-09 DIAGNOSIS — E669 Obesity, unspecified: Secondary | ICD-10-CM | POA: Diagnosis present

## 2021-12-09 NOTE — ED Provider Notes (Signed)
[facility] EMERGENCY DEPARTMENT Provider Note  CSN: [id number] Arrival date & time: 12/09/21 2319  Chief Complaint(s) Aphasia  HPI Maria Porter is a 46 y.o. female with a past medical history listed below including cryptogenic strokes x3 in 2021, last of which was April of this year with residual aphasia and right-sided deficits (upper and lower extremity weakness), as well as left arm numbness who presents to the emergency department for worsening aphasia.  Patient reports that she had a headache beginning around 11 AM.  She began noticing worsening aphasia since.  Around 3 PM she went to the bathroom and noted left arm weakness that lasted a few minutes and resolved.  She reports that she has been sleeping most of the day and had a call from her friend this evening who noted the severe aphasia.  She endorses nausea without emesis.  No chest pain or shortness of breath.  No abdominal pain.  No urinary symptoms.  She denies recent fevers or infections.  She denies any recent alcohol use.  No drug use.  States that she lives here in [place] by herself in an apartment.  Her son is visiting for the month and staying with her.  Patient was on Eliquis for DVT but was taken off of it this past week after a [place] was clear.  The history is provided by the patient and medical records.    Past Medical History Past Medical History:  Diagnosis Date   Acute deep vein thrombosis of right iliac vein (HCC) 09/14/2020   Stroke [facility])    Patient Active Problem List   Diagnosis Date Noted   GERD (gastroesophageal reflux disease) 07/20/2021   Right shoulder pain 05/23/2021   Healthcare maintenance 03/07/2021   History of DVT (deep vein thrombosis) 10/06/2020   Family history of premature coronary heart disease 09/14/2020   OSA (obstructive sleep apnea) 09/14/2020   Pre-diabetes 02/03/2020   Subclinical hypothyroidism 02/03/2020   Tobacco use disorder 01/27/2020   Hyperlipemia  01/27/2020   Obesity 01/27/2020   History of CVA with residual deficit 01/26/2020   Extrinsic asthma 10/09/2004   Home Medication(s) Prior to Admission medications   Medication Sig Start Date End Date Taking? Authorizing Provider  albuterol (PROVENTIL) (2.5 MG/3ML) 0.083% nebulizer solution Take 3 mLs (2.5 mg total) by nebulization every 4 (four) hours as needed for wheezing or shortness of breath. 06/05/21 06/05/22  Rehman, Areeg N, DO  aspirin EC 81 MG tablet Take 1 tablet (81 mg total) by mouth daily. Swallow whole. 07/06/21   Belva Agee, MD  atorvastatin (LIPITOR) 80 MG tablet Take 1 tablet (80 mg total) by mouth daily. 11/14/21   Katsadouros, Vasilios, MD  cetirizine (ZYRTEC) 10 MG tablet Take 10 mg by mouth daily. 11/15/21   [provider]  ezetimibe (ZETIA) 10 MG tablet Take 1 tablet (10 mg total) by mouth daily. 11/14/21   Maisie Fus, MD  fluticasone (FLONASE) 50 MCG/ACT nasal spray Place 1 spray into both nostrils daily. 11/15/21   [provider]  levothyroxine (SYNTHROID) 150 MCG tablet Take 1 tablet (150 mcg total) by mouth daily. 11/14/21   Belva Agee, MD  ondansetron (ZOFRAN) 4 MG tablet Take 1 tablet (4 mg total) by mouth every 8 (eight) hours as needed for nausea or vomiting. 11/14/21   Katsadouros, Vasilios, MD  pantoprazole (PROTONIX) 40 MG tablet TAKE 1 TABLET BY MOUTH EVERY DAY 12/06/21   Katsadouros, Vasilios, MD  polyethylene glycol (MIRALAX / GLYCOLAX) 17 g packet Take  by mouth. Patient not taking: Reported on 12/04/2021 09/07/21   [provider]  VENTOLIN HFA 108 (90 Base) MCG/ACT inhaler INHALE 1-2 PUFFS BY MOUTH EVERY 6 HOURS AS NEEDED FOR WHEEZE OR SHORTNESS OF BREATH 11/16/21   Belva Agee, MD                                                                                                                                    Allergies Patient has no known allergies.  Review of Systems Review of Systems As noted in  HPI  Physical Exam Vital Signs  I have reviewed the triage vital signs BP 120/77   Pulse 84   Temp 98 F (36.7 C) (Oral)   Resp (!) 22   SpO2 97%   Physical Exam Vitals reviewed.  Constitutional:      General: She is not in acute distress.    Appearance: She is well-developed. She is not diaphoretic.  HENT:     Head: Normocephalic and atraumatic.     Nose: Nose normal.  Eyes:     General: No scleral icterus.       Right eye: No discharge.        Left eye: No discharge.     Conjunctiva/sclera: Conjunctivae normal.     Pupils: Pupils are unequal (right 62mm, left 75mm (baseline anisocoria s/p stroke per patient)).     Right eye: Pupil is round and reactive.     Left eye: Pupil is round and reactive.  Cardiovascular:     Rate and Rhythm: Normal rate and regular rhythm.     Heart sounds: No murmur heard.    No friction rub. No gallop.  Pulmonary:     Effort: Pulmonary effort is normal. No respiratory distress.     Breath sounds: Normal breath sounds. No stridor. No rales.  Abdominal:     General: There is no distension.     Palpations: Abdomen is soft.     Tenderness: There is no abdominal tenderness.  Musculoskeletal:        General: No tenderness.     Cervical back: Normal range of motion and neck supple.  Skin:    General: Skin is warm and dry.     Findings: No erythema or rash.  Neurological:     Mental Status: She is alert and oriented to person, place, and time.     Comments: Mental Status:  Alert and oriented to person, place, and time.  Attention and concentration normal.  Speech aphasic.  Recent memory is intact  Cranial Nerves:  II Visual Fields: Intact to confrontation. Visual fields intact. III, IV, VI: Pupils equal and reactive to light and near. Full eye movement without nystagmus  V Facial Sensation: Normal. No weakness of masticatory muscles  VII: No facial weakness or asymmetry  VIII Auditory Acuity: Grossly normal  IX/X: The uvula is midline; the  palate elevates symmetrically  XI: Normal  sternocleidomastoid and trapezius strength  XII: The tongue is midline. No atrophy or fasciculations.   Motor System: Muscle Strength: 3/5 in right upper and lower extremities. 5/5 in the left upper and lower extremities. No pronation or drift.  Muscle Tone: Tone and muscle bulk are normal in the upper and lower extremities.  Sensation: Intact to light touch. Gait: deferred.      ED Results and Treatments Labs (all labs ordered are listed, but only abnormal results are displayed) Labs Reviewed  COMPREHENSIVE METABOLIC PANEL - Abnormal; Notable for the following components:      Result Value   Glucose, Bld 133 (*)    All other components within normal limits  URINALYSIS, ROUTINE W REFLEX MICROSCOPIC - Abnormal; Notable for the following components:   Specific Gravity, Urine >1.046 (*)    All other components within normal limits  I-STAT CHEM 8, ED - Abnormal; Notable for the following components:   Glucose, Bld 129 (*)    All other components within normal limits  RESP PANEL BY RT-PCR (FLU A&B, COVID) ARPGX2  ETHANOL  PROTIME-INR  APTT  CBC  DIFFERENTIAL  RAPID URINE DRUG SCREEN, HOSP PERFORMED  I-STAT BETA HCG BLOOD, ED ([facility], [facility], [facility] ONLY)                                                                                                                         EKG  EKG Interpretation  Date/Time:  Saturday December 09 2021 23:25:06 EDT Ventricular Rate:  88 PR Interval:  136 QRS Duration: 95 QT Interval:  366 QTC Calculation: 443 R Axis:   102 Text Interpretation: Sinus rhythm Anterolateral infarct, age indeterminate No significant change was found Confirmed by Drema Pry [phone]) on 12/10/2021 1:24:20 AM       Radiology MR BRAIN WO CONTRAST  Result Date: 12/10/2021 CLINICAL DATA:  Headache, new or worsening EXAM: MRI HEAD WITHOUT CONTRAST TECHNIQUE: Multiplanar, multiecho pulse sequences of the brain and surrounding structures  were obtained without intravenous contrast. COMPARISON:  CTA from earlier today.  MRI 01/26/2020 FINDINGS: Brain: Small scattered acute infarcts along the right insular and frontal parietal cortex. Remote infarcts in the inferior medial left cerebellum, right superior cerebellum, anterior left frontal cortex and left basal ganglia. The basal ganglia and right superior cerebellar infarcts are new. No hemorrhage, hydrocephalus, or collection. Vascular: Major flow voids are preserved Skull and upper cervical spine: Normal marrow signal Sinuses/Orbits: Negative IMPRESSION: 1. Small acute cortical infarcts scattered in the right MCA distribution. 2. Multiple remote infarcts with progression from 2021, especially in the superior right cerebellum. Electronically Signed   By: Tiburcio Pea M.D.   On: 12/10/2021 06:01   CT ANGIO HEAD NECK W WO CM  Result Date: 12/10/2021 CLINICAL DATA:  Acute neurologic deficit EXAM: CT ANGIOGRAPHY HEAD AND NECK TECHNIQUE: Multidetector CT imaging of the head and neck was performed using the standard protocol during bolus administration of intravenous contrast. Multiplanar CT image reconstructions and MIPs were obtained to  evaluate the vascular anatomy. Carotid stenosis measurements (when applicable) are obtained utilizing NASCET criteria, using the distal internal carotid diameter as the denominator. RADIATION DOSE REDUCTION: This exam was performed according to the departmental dose-optimization program which includes automated exposure control, adjustment of the mA and/or kV according to patient size and/or use of iterative reconstruction technique. CONTRAST:  75mL OMNIPAQUE IOHEXOL 350 MG/ML SOLN COMPARISON:  None Available. FINDINGS: CT HEAD FINDINGS Brain: There is no mass, hemorrhage or extra-axial collection. The size and configuration of the ventricles and extra-axial CSF spaces are normal. There is no acute or chronic infarction. The brain parenchyma is normal. Skull: The  visualized skull base, calvarium and extracranial soft tissues are normal. Sinuses/Orbits: No fluid levels or advanced mucosal thickening of the visualized paranasal sinuses. No mastoid or middle ear effusion. The orbits are normal. CTA NECK FINDINGS SKELETON: There is no bony spinal canal stenosis. No lytic or blastic lesion. OTHER NECK: Normal pharynx, larynx and major salivary glands. No cervical lymphadenopathy. Unremarkable thyroid gland. UPPER CHEST: No pneumothorax or pleural effusion. No nodules or masses. AORTIC ARCH: There is no calcific atherosclerosis of the aortic arch. There is no aneurysm, dissection or hemodynamically significant stenosis of the visualized portion of the aorta. Conventional 3 vessel aortic branching pattern. The visualized proximal subclavian arteries are widely patent. RIGHT CAROTID SYSTEM: Normal without aneurysm, dissection or stenosis. LEFT CAROTID SYSTEM: Normal without aneurysm, dissection or stenosis. VERTEBRAL ARTERIES: Left dominant configuration. Both origins are clearly patent. There is no dissection, occlusion or flow-limiting stenosis to the skull base (V1-V3 segments). CTA HEAD FINDINGS POSTERIOR CIRCULATION: --Vertebral arteries: Normal V4 segments. --Inferior cerebellar arteries: Normal. --Basilar artery: Normal. --Superior cerebellar arteries: Normal. --Posterior cerebral arteries (PCA): Normal. ANTERIOR CIRCULATION: --Intracranial internal carotid arteries: Normal. --Anterior cerebral arteries (ACA): Normal. Both A1 segments are present. Patent anterior communicating artery (a-comm). --Middle cerebral arteries (MCA): Normal. VENOUS SINUSES: As permitted by contrast timing, patent. ANATOMIC VARIANTS: None Review of the MIP images confirms the above findings. IMPRESSION: Normal CTA of the head and neck. Electronically Signed   By: Deatra RobinsonKevin  Herman M.D.   On: 12/10/2021 00:45    Pertinent labs & imaging results that were available during my care of the patient were  reviewed by me and considered in my medical decision making (see MDM for details).  Medications Ordered in ED Medications  iohexol (OMNIPAQUE) 350 MG/ML injection 75 mL (75 mLs Intravenous Contrast Given 12/10/21 0016)  prochlorperazine (COMPAZINE) injection 10 mg (10 mg Intravenous Given 12/10/21 0037)  diazepam (VALIUM) injection 5 mg (5 mg Intravenous Given 12/10/21 0517)                                                                                                                                     Procedures Procedures  (including critical care time)  Medical Decision Making / ED Course  Medical Decision Making Problems Addressed: Acute ischemic right MCA stroke [facility](HCC): complicated  acute illness or injury  Amount and/or Complexity of Data Reviewed External Data Reviewed: radiology. Labs: ordered. Radiology: ordered and independent interpretation performed. ECG/medicine tests: ordered and independent interpretation performed.  Risk Prescription drug management. Decision regarding hospitalization.        ED Course:  Clinical Course as of 12/10/21 0752  Sat Dec 09, 2021  2350 Worsening of her prior deficits. Will need to determine whether this is worsening of old stroke or recrudescence. Will assess for electrolyte/metabolic derangements or evidence of infection. Discussed case with Dr. Derry Lory, from Neuro who agreed with obtaining CT and CTA. He also recommended MRI brain afterward.  [PC]  Sun Dec 10, 2021  8119  Imaging Studies ordered listed below with my independent interpretation: -CT head without evidence of large vessel occlusion or ICH [PC]  0340 Labs independently interpreted by me: -CBC without leukocytosis or anemia -Metabolic panel without significant electrolyte derangements or renal sufficiency -Alcohol and UDS negative -UA without evidence of infection [PC]  0515 On reassessment, patient's aphasia had improved.  She has her [place] accent that was  residual from stroke.  Still pending MRI [PC]  0710 MRI with new right MCA stroke. [PC]  [id number] Spoke with Dr. Selina Cooley from Neurology. She will review the MRI and provide recommendations [PC]  0751 Dr.Stack recommended admission. Spoke with IM who will admit.   [PC]    Clinical Course User Index [PC] Dereke Neumann, Amadeo Garnet, MD     Final Clinical Impression(s) / ED Diagnoses Final diagnoses:  Acute ischemic right MCA stroke [facility]           This chart was dictated using voice recognition software.  Despite best efforts to proofread,  errors can occur which can change the documentation meaning.    Nira Conn, MD 12/10/21 [phone]

## 2021-12-09 NOTE — ED Triage Notes (Signed)
Pt brought to ED by [facility] with c/o sudden onset of headache and slurred speech at approximately 11am today. Pt has slurred speech and weakness on right side from previous CVA, but pt states speech has worsened today. Pt initially hypertensive with EMS 160/100.   EMS Vitals BP 115/89 HR 90 RR 14 SPO2 97% RA CBG 129

## 2021-12-10 ENCOUNTER — Inpatient Hospital Stay (HOSPITAL_COMMUNITY): Payer: Medicaid Other

## 2021-12-10 ENCOUNTER — Emergency Department (HOSPITAL_COMMUNITY): Payer: Medicaid Other

## 2021-12-10 DIAGNOSIS — E785 Hyperlipidemia, unspecified: Secondary | ICD-10-CM | POA: Diagnosis not present

## 2021-12-10 DIAGNOSIS — R7303 Prediabetes: Secondary | ICD-10-CM | POA: Diagnosis present

## 2021-12-10 DIAGNOSIS — E038 Other specified hypothyroidism: Secondary | ICD-10-CM | POA: Diagnosis not present

## 2021-12-10 DIAGNOSIS — Z86718 Personal history of other venous thrombosis and embolism: Secondary | ICD-10-CM

## 2021-12-10 DIAGNOSIS — I6930 Unspecified sequelae of cerebral infarction: Secondary | ICD-10-CM

## 2021-12-10 DIAGNOSIS — Z8249 Family history of ischemic heart disease and other diseases of the circulatory system: Secondary | ICD-10-CM | POA: Diagnosis not present

## 2021-12-10 DIAGNOSIS — I639 Cerebral infarction, unspecified: Secondary | ICD-10-CM

## 2021-12-10 DIAGNOSIS — E039 Hypothyroidism, unspecified: Secondary | ICD-10-CM

## 2021-12-10 DIAGNOSIS — I69351 Hemiplegia and hemiparesis following cerebral infarction affecting right dominant side: Secondary | ICD-10-CM | POA: Diagnosis not present

## 2021-12-10 DIAGNOSIS — Z6835 Body mass index (BMI) 35.0-35.9, adult: Secondary | ICD-10-CM | POA: Diagnosis not present

## 2021-12-10 DIAGNOSIS — F17210 Nicotine dependence, cigarettes, uncomplicated: Secondary | ICD-10-CM

## 2021-12-10 DIAGNOSIS — Z79899 Other long term (current) drug therapy: Secondary | ICD-10-CM | POA: Diagnosis not present

## 2021-12-10 DIAGNOSIS — Z20822 Contact with and (suspected) exposure to covid-19: Secondary | ICD-10-CM | POA: Diagnosis present

## 2021-12-10 DIAGNOSIS — E7800 Pure hypercholesterolemia, unspecified: Secondary | ICD-10-CM | POA: Diagnosis not present

## 2021-12-10 DIAGNOSIS — E669 Obesity, unspecified: Secondary | ICD-10-CM | POA: Diagnosis present

## 2021-12-10 DIAGNOSIS — R471 Dysarthria and anarthria: Secondary | ICD-10-CM | POA: Diagnosis present

## 2021-12-10 DIAGNOSIS — I6389 Other cerebral infarction: Secondary | ICD-10-CM

## 2021-12-10 DIAGNOSIS — I10 Essential (primary) hypertension: Secondary | ICD-10-CM | POA: Diagnosis not present

## 2021-12-10 DIAGNOSIS — Z8673 Personal history of transient ischemic attack (TIA), and cerebral infarction without residual deficits: Secondary | ICD-10-CM | POA: Diagnosis not present

## 2021-12-10 DIAGNOSIS — I69992 Facial weakness following unspecified cerebrovascular disease: Secondary | ICD-10-CM | POA: Diagnosis not present

## 2021-12-10 DIAGNOSIS — Z66 Do not resuscitate: Secondary | ICD-10-CM | POA: Diagnosis present

## 2021-12-10 DIAGNOSIS — Z79890 Hormone replacement therapy (postmenopausal): Secondary | ICD-10-CM | POA: Diagnosis not present

## 2021-12-10 DIAGNOSIS — I63411 Cerebral infarction due to embolism of right middle cerebral artery: Secondary | ICD-10-CM | POA: Diagnosis not present

## 2021-12-10 DIAGNOSIS — I63511 Cerebral infarction due to unspecified occlusion or stenosis of right middle cerebral artery: Secondary | ICD-10-CM

## 2021-12-10 DIAGNOSIS — G4733 Obstructive sleep apnea (adult) (pediatric): Secondary | ICD-10-CM | POA: Diagnosis present

## 2021-12-10 DIAGNOSIS — R29702 NIHSS score 2: Secondary | ICD-10-CM | POA: Diagnosis present

## 2021-12-10 DIAGNOSIS — Z7982 Long term (current) use of aspirin: Secondary | ICD-10-CM | POA: Diagnosis not present

## 2021-12-10 DIAGNOSIS — R519 Headache, unspecified: Secondary | ICD-10-CM | POA: Diagnosis not present

## 2021-12-10 LAB — LIPID PANEL
Cholesterol: 125 mg/dL (ref 0–200)
HDL: 50 mg/dL (ref >40)
LDL Cholesterol: 61 mg/dL (ref 0–99)
Total CHOL/HDL Ratio: 2.5 RATIO
Triglycerides: 68 mg/dL (ref <150)
VLDL: 14 mg/dL (ref 0–40)

## 2021-12-10 LAB — PROTIME-INR
INR: 1 (ref 0.8–1.2)
Prothrombin Time: 13.6 seconds (ref 11.4–15.2)

## 2021-12-10 LAB — DIFFERENTIAL
Abs Immature Granulocytes: 0.02 10*3/uL (ref 0.00–0.07)
Basophils Absolute: 0 10*3/uL (ref 0.0–0.1)
Basophils Relative: 1 %
Eosinophils Absolute: 0.2 10*3/uL (ref 0.0–0.5)
Eosinophils Relative: 4 %
Immature Granulocytes: 0 %
Lymphocytes Relative: 35 %
Lymphs Abs: 2.3 10*3/uL (ref 0.7–4.0)
Monocytes Absolute: 0.4 10*3/uL (ref 0.1–1.0)
Monocytes Relative: 5 %
Neutro Abs: 3.6 10*3/uL (ref 1.7–7.7)
Neutrophils Relative %: 55 %

## 2021-12-10 LAB — CBC
HCT: 41.6 % (ref 36.0–46.0)
Hemoglobin: 13.6 g/dL (ref 12.0–15.0)
MCH: 29.1 pg (ref 26.0–34.0)
MCHC: 32.7 g/dL (ref 30.0–36.0)
MCV: 88.9 fL (ref 80.0–100.0)
Platelets: 344 10*3/uL (ref 150–400)
RBC: 4.68 MIL/uL (ref 3.87–5.11)
RDW: 12.4 % (ref 11.5–15.5)
WBC: 6.5 10*3/uL (ref 4.0–10.5)
nRBC: 0 % (ref 0.0–0.2)

## 2021-12-10 LAB — I-STAT CHEM 8, ED
BUN: 8 mg/dL (ref 6–20)
Calcium, Ion: 1.21 mmol/L (ref 1.15–1.40)
Chloride: 105 mmol/L (ref 98–111)
Creatinine, Ser: 0.8 mg/dL (ref 0.44–1.00)
Glucose, Bld: 129 mg/dL — ABNORMAL HIGH (ref 70–99)
HCT: 41 % (ref 36.0–46.0)
Hemoglobin: 13.9 g/dL (ref 12.0–15.0)
Potassium: 3.8 mmol/L (ref 3.5–5.1)
Sodium: 140 mmol/L (ref 135–145)
TCO2: 23 mmol/L (ref 22–32)

## 2021-12-10 LAB — COMPREHENSIVE METABOLIC PANEL
ALT: 34 U/L (ref 0–44)
AST: 26 U/L (ref 15–41)
Albumin: 3.8 g/dL (ref 3.5–5.0)
Alkaline Phosphatase: 93 U/L (ref 38–126)
Anion gap: 8 (ref 5–15)
BUN: 8 mg/dL (ref 6–20)
CO2: 23 mmol/L (ref 22–32)
Calcium: 9.6 mg/dL (ref 8.9–10.3)
Chloride: 107 mmol/L (ref 98–111)
Creatinine, Ser: 0.82 mg/dL (ref 0.44–1.00)
GFR, Estimated: >60 mL/min (ref >60)
Glucose, Bld: 133 mg/dL — ABNORMAL HIGH (ref 70–99)
Potassium: 3.8 mmol/L (ref 3.5–5.1)
Sodium: 138 mmol/L (ref 135–145)
Total Bilirubin: 0.5 mg/dL (ref 0.3–1.2)
Total Protein: 6.9 g/dL (ref 6.5–8.1)

## 2021-12-10 LAB — URINALYSIS, ROUTINE W REFLEX MICROSCOPIC
Bilirubin Urine: NEGATIVE
Glucose, UA: NEGATIVE mg/dL
Hgb urine dipstick: NEGATIVE
Ketones, ur: NEGATIVE mg/dL
Leukocytes,Ua: NEGATIVE
Nitrite: NEGATIVE
Protein, ur: NEGATIVE mg/dL
Specific Gravity, Urine: >1.046 — ABNORMAL HIGH (ref 1.005–1.030)
pH: 6 (ref 5.0–8.0)

## 2021-12-10 LAB — I-STAT BETA HCG BLOOD, ED (MC, WL, AP ONLY): I-stat hCG, quantitative: <5 m[IU]/mL (ref <5)

## 2021-12-10 LAB — ETHANOL: Alcohol, Ethyl (B): <10 mg/dL (ref <10)

## 2021-12-10 LAB — RAPID URINE DRUG SCREEN, HOSP PERFORMED
Amphetamines: NOT DETECTED
Barbiturates: NOT DETECTED
Benzodiazepines: NOT DETECTED
Cocaine: NOT DETECTED
Opiates: NOT DETECTED
Tetrahydrocannabinol: NOT DETECTED

## 2021-12-10 LAB — RESP PANEL BY RT-PCR (FLU A&B, COVID) ARPGX2
Influenza A by PCR: NEGATIVE
Influenza B by PCR: NEGATIVE
SARS Coronavirus 2 by RT PCR: NEGATIVE

## 2021-12-10 LAB — HEMOGLOBIN A1C
Hgb A1c MFr Bld: 6.2 % — ABNORMAL HIGH (ref 4.8–5.6)
Mean Plasma Glucose: 131.24 mg/dL

## 2021-12-10 LAB — TSH: TSH: 17.28 u[IU]/mL — ABNORMAL HIGH (ref 0.350–4.500)

## 2021-12-10 LAB — HIV ANTIBODY (ROUTINE TESTING W REFLEX): HIV Screen 4th Generation wRfx: NONREACTIVE

## 2021-12-10 LAB — ECHOCARDIOGRAM COMPLETE BUBBLE STUDY
Area-P 1/2: 3.21 cm2
S' Lateral: 2.5 cm

## 2021-12-10 LAB — APTT: aPTT: 25 seconds (ref 24–36)

## 2021-12-10 MED ORDER — ACETAMINOPHEN 325 MG PO TABS
650.0000 mg | ORAL_TABLET | Freq: Four times a day (QID) | ORAL | Status: DC | PRN
  Administered 2021-12-11: 650 mg via ORAL
  Filled 2021-12-10: qty 2

## 2021-12-10 MED ORDER — ASPIRIN 81 MG PO TBEC
81.0000 mg | DELAYED_RELEASE_TABLET | Freq: Every day | ORAL | Status: DC
  Filled 2021-12-10: qty 1

## 2021-12-10 MED ORDER — PROCHLORPERAZINE EDISYLATE 10 MG/2ML IJ SOLN
10.0000 mg | Freq: Once | INTRAMUSCULAR | Status: AC
  Administered 2021-12-10: 10 mg via INTRAVENOUS
  Filled 2021-12-10: qty 2

## 2021-12-10 MED ORDER — ATORVASTATIN CALCIUM 80 MG PO TABS
80.0000 mg | ORAL_TABLET | Freq: Every day | ORAL | Status: DC
  Administered 2021-12-11 – 2021-12-12 (×2): 80 mg via ORAL
  Filled 2021-12-10: qty 1
  Filled 2021-12-10: qty 2
  Filled 2021-12-10: qty 1

## 2021-12-10 MED ORDER — PANTOPRAZOLE SODIUM 40 MG PO TBEC
40.0000 mg | DELAYED_RELEASE_TABLET | Freq: Every day | ORAL | Status: DC
  Administered 2021-12-10 – 2021-12-12 (×3): 40 mg via ORAL
  Filled 2021-12-10 (×3): qty 1

## 2021-12-10 MED ORDER — ALBUTEROL SULFATE (2.5 MG/3ML) 0.083% IN NEBU: 2.5000 mg | INHALATION_SOLUTION | RESPIRATORY_TRACT | Status: DC | PRN

## 2021-12-10 MED ORDER — IOHEXOL 350 MG/ML SOLN
75.0000 mL | Freq: Once | INTRAVENOUS | Status: AC | PRN
  Administered 2021-12-10: 75 mL via INTRAVENOUS

## 2021-12-10 MED ORDER — ASPIRIN 81 MG PO TBEC: 81.0000 mg | DELAYED_RELEASE_TABLET | Freq: Every day | ORAL | Status: DC

## 2021-12-10 MED ORDER — LEVOTHYROXINE SODIUM 25 MCG PO TABS
125.0000 ug | ORAL_TABLET | Freq: Every day | ORAL | Status: DC
  Administered 2021-12-10: 125 ug via ORAL
  Filled 2021-12-10: qty 1

## 2021-12-10 MED ORDER — ENOXAPARIN SODIUM 40 MG/0.4ML IJ SOSY
40.0000 mg | PREFILLED_SYRINGE | INTRAMUSCULAR | Status: DC
  Administered 2021-12-10: 40 mg via SUBCUTANEOUS
  Filled 2021-12-10: qty 0.4

## 2021-12-10 MED ORDER — LEVOTHYROXINE SODIUM 75 MCG PO TABS
175.0000 ug | ORAL_TABLET | Freq: Every day | ORAL | Status: DC
  Administered 2021-12-11 – 2021-12-12 (×2): 175 ug via ORAL
  Filled 2021-12-10 (×2): qty 1

## 2021-12-10 MED ORDER — ACETAMINOPHEN 650 MG RE SUPP: 650.0000 mg | Freq: Four times a day (QID) | RECTAL | Status: DC | PRN

## 2021-12-10 MED ORDER — DIAZEPAM 5 MG/ML IJ SOLN
5.0000 mg | Freq: Once | INTRAMUSCULAR | Status: AC
  Administered 2021-12-10: 5 mg via INTRAVENOUS
  Filled 2021-12-10: qty 2

## 2021-12-10 MED ORDER — EZETIMIBE 10 MG PO TABS
10.0000 mg | ORAL_TABLET | Freq: Every day | ORAL | Status: DC
  Administered 2021-12-11 – 2021-12-12 (×2): 10 mg via ORAL
  Filled 2021-12-10 (×3): qty 1

## 2021-12-10 MED ORDER — LEVOTHYROXINE SODIUM 75 MCG PO TABS
150.0000 ug | ORAL_TABLET | Freq: Every day | ORAL | Status: DC
  Filled 2021-12-10: qty 2

## 2021-12-10 MED ORDER — ASPIRIN 325 MG PO TABS
325.0000 mg | ORAL_TABLET | Freq: Once | ORAL | Status: AC
Start: 2021-12-10 — End: 2021-12-10
  Administered 2021-12-10: 325 mg via ORAL
  Filled 2021-12-10: qty 1

## 2021-12-10 NOTE — ED Notes (Signed)
Pt up to bedside commode with minimal assistance

## 2021-12-10 NOTE — ED Notes (Signed)
To MRI

## 2021-12-10 NOTE — ED Notes (Addendum)
Patient was assisted to the bedside comode for urine sample. Patient is now back in bed, resting.

## 2021-12-10 NOTE — ED Notes (Signed)
Back from CT

## 2021-12-10 NOTE — ED Notes (Signed)
Pt given PO medication with applesauce per SLP recommendation

## 2021-12-10 NOTE — Consult Note (Signed)
Neurology Consultation  Reason for Consult: Right MCA distribution small cortical infarcts identified on MRI, recurrent cryptogenic strokes. Referring Physician: Dr. Antony Contras  CC: Slurred speech  History is obtained from: Patient, Chart review  HPI: Maria Porter is a 46 y.o. 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  In May of 2022, due to stroke following discontinuation of Eliquis for previous right external iliac DVT, patient was placed back on Eliquis which was again discontinued approximately 1 week ago.  Patient notes that yesterday morning 8/5 she had acute onset of headache at 11:00.  She states that her son came back to visit her yesterday evening around 17:00 and noted that she had slurred speech.  Patient states that she had not noticed any speech deficit prior to 17:00 and decided to come to the ED for further evaluation. MRI brain showed multiple acute small cortical infarcts in the R MCA distribution (personal review by Dr. Selina Cooley).  Of note patient has had unremarkable hypercoagulable work-up completed in the past due to multiple cryptogenic strokes except for slightly elevated anticardiolipin IgM antibody of 14.  ROS: A complete ROS was performed and is negative except as noted in the HPI.   Past Medical History:  Diagnosis Date   Acute deep vein thrombosis of right iliac vein (HCC) 09/14/2020   Stroke [facility])    Past Surgical History:  Procedure Laterality Date   BUBBLE STUDY  01/29/2020   Procedure: BUBBLE  STUDY;  Surgeon: Little Ishikawa, MD;  Location: [facility] ENDOSCOPY;  Service: Cardiovascular;;   TEE WITHOUT CARDIOVERSION N/A 01/29/2020   Procedure: TRANSESOPHAGEAL ECHOCARDIOGRAM (TEE);  Surgeon: Little Ishikawa, MD;  Location: [facility] ENDOSCOPY;  Service: Cardiovascular;  Laterality: N/A;   Family History  Problem Relation Age of Onset   Sleep apnea Neg Hx    Social History:   reports that she has been smoking cigarettes. She started smoking about 21 months ago. She has been smoking an average of .5 packs per day. She has never used smokeless tobacco. She reports current alcohol use. No history on file for drug use.  Medications  Current Facility-Administered Medications:    acetaminophen (TYLENOL) tablet 650 mg, 650 mg, Oral, Q6H PRN **OR** acetaminophen (TYLENOL) suppository 650 mg, 650 mg, Rectal, Q6H PRN, Doran Stabler, DO   albuterol (PROVENTIL) (2.5 MG/3ML) 0.083% nebulizer solution 2.5 mg, 2.5 mg, Nebulization, Q2H PRN, Doran Stabler, DO   aspirin EC tablet 81 mg, 81 mg, Oral, Daily, Doran Stabler, DO   atorvastatin (LIPITOR) tablet 80 mg, 80 mg, Oral, Daily, Doran Stabler, DO   ezetimibe (ZETIA) tablet 10 mg, 10 mg, Oral, Daily, Doran Stabler, DO   levothyroxine (SYNTHROID) tablet 150 mcg, 150 mcg, Oral, Daily, Doran Stabler, DO  Current Outpatient Medications:    albuterol (PROVENTIL) (2.5 MG/3ML) 0.083% nebulizer solution, Take 3 mLs (2.5 mg total) by nebulization every 4 (four) hours as needed for wheezing or shortness of breath., Disp: 75 mL, Rfl: 2   aspirin EC 81 MG tablet, Take 1 tablet (81 mg total) by mouth daily. Swallow whole. (Patient taking differently: Take 81 mg by mouth daily.), Disp:  30 tablet, Rfl: 11   atorvastatin (LIPITOR) 80 MG tablet, Take 1 tablet (80 mg total) by mouth daily., Disp: 90 tablet, Rfl: 1   cetirizine (ZYRTEC) 10 MG tablet, Take 10 mg by mouth daily., Disp: , Rfl:    ezetimibe (ZETIA) 10 MG tablet, Take 1 tablet (10 mg total) by mouth daily.,  Disp: 90 tablet, Rfl: 3   fluticasone (FLONASE) 50 MCG/ACT nasal spray, Place 1 spray into both nostrils daily., Disp: , Rfl:    levothyroxine (SYNTHROID) 150 MCG tablet, Take 1 tablet (150 mcg total) by mouth daily., Disp: 90 tablet, Rfl: 2   ondansetron (ZOFRAN) 4 MG tablet, Take 1 tablet (4 mg total) by mouth every 8 (eight) hours as needed for nausea or vomiting., Disp: 20 tablet, Rfl: 0   pantoprazole (PROTONIX) 40 MG tablet, TAKE 1 TABLET BY MOUTH EVERY DAY, Disp: 30 tablet, Rfl: 1   polyethylene glycol (MIRALAX / GLYCOLAX) 17 g packet, Take 17 g by mouth daily as needed for mild constipation or moderate constipation., Disp: , Rfl:    VENTOLIN HFA 108 (90 Base) MCG/ACT inhaler, INHALE 1-2 PUFFS BY MOUTH EVERY 6 HOURS AS NEEDED FOR WHEEZE OR SHORTNESS OF BREATH (Patient taking differently: Inhale 1-2 puffs into the lungs every 6 (six) hours as needed for wheezing or shortness of breath.), Disp: 18 each, Rfl: 3  Exam: Current vital signs: BP 131/81 (BP Location: Left Arm)   Pulse 83   Temp 97.8 F (36.6 C) (Oral)   Resp 20   Ht 5\' 6"  (1.676 m)   Wt 100.2 kg   SpO2 100%   BMI 35.67 kg/m  Vital signs in last 24 hours: Temp:  [97.8 F (36.6 C)-98.4 F (36.9 C)] 97.8 F (36.6 C) (08/06 0924) Pulse Rate:  [63-95] 83 (08/06 0924) Resp:  [16-25] 20 (08/06 0924) BP: (112-148)/(62-113) 131/81 (08/06 0924) SpO2:  [95 %-100 %] 100 % (08/06 0924) Weight:  [100.2 kg] 100.2 kg (08/06 0924)  GENERAL: Awake, alert, in no acute distress Psych: Affect appropriate for situation, patient is calm and cooperative with examination Head: Normocephalic and atraumatic, without obvious abnormality EENT: Normal conjunctivae, dry mucous membranes, no OP obstruction LUNGS: Normal respiratory effort. Non-labored breathing on room air CV: Regular rate and rhythm on telemetry ABDOMEN: Soft, non-tender, rounded Extremities: Warm, well perfused, without obvious deformity  NEURO:  Mental Status: Awake,  alert, and oriented to person, place, time, and situation. She provides variable details regarding history of current presentation.  Speech/Language: speech is dysarthric. Naming, repetition, fluency, and comprehension intact without evidence of aphasia. No neglect is noted Cranial Nerves:  II: PERRL 4 mm/brisk. Visual fields full.  III, IV, VI: EOMI without gaze preference, nystagmus, ptosis V: Decree sensation to the left face, reported as chronic VII: Right lower facial weakness noted VIII: Hearing is intact to voice IX, X: Palate elevation is symmetric. Phonation normal.  XI: Normal sternocleidomastoid and trapezius muscle strength XII: Tongue protrudes midline without fasciculations.   Motor: Left upper and lower extremity 5/5 strength without vertical drift.   Right lower extremity is without vertical drift on assessment with mildly increased tone. Right upper extremity patient complains of right shoulder tightness and limited ROM. Right upper extremity spasticity noted proximally > distally. RUE 4/5 proximally, 4+/5 distally.  Bulk is normal.  Sensation: Decreased sensation to the left face, arm, leg compared to the right.  Chronic per patient. Coordination: No overt ataxia noted.  Right upper extremity ataxia assessment limited due to impaired ROM. DTRs:  1+ and symmetric patellae Gait: Deferred  NIHSS: 1a Level of Conscious.: 0 1b LOC Questions: 0 1c LOC Commands: 0 2 Best Gaze: 0 3 Visual: 0 4 Facial Palsy: 1 5a Motor Arm - left: 0 5b Motor Arm - Right: 1 6a Motor Leg - Left: 0 6b Motor Leg - Right: 0 7 Limb Ataxia: 0 8 Sensory: 2 9 Best Language: 0 10 Dysarthria: 1 11 Extinct. and Inatten.: 0 TOTAL: 3  Labs I have reviewed labs in epic and the results pertinent to this consultation are: CBC    Component Value Date/Time   WBC 6.5 12/09/2021 2352   RBC 4.68 12/09/2021 2352   HGB 13.9 12/10/2021 0000   HGB 14.2 02/03/2020 1030   HCT 41.0 12/10/2021 0000   HCT  43.4 02/03/2020 1030   PLT 344 12/09/2021 2352   PLT 307 02/03/2020 1030   MCV 88.9 12/09/2021 2352   MCV 90 02/03/2020 1030   MCH 29.1 12/09/2021 2352   MCHC 32.7 12/09/2021 2352   RDW 12.4 12/09/2021 2352   RDW 12.3 02/03/2020 1030   LYMPHSABS 2.3 12/09/2021 2352   MONOABS 0.4 12/09/2021 2352   EOSABS 0.2 12/09/2021 2352   BASOSABS 0.0 12/09/2021 2352   CMP     Component Value Date/Time   NA 140 12/10/2021 0000   NA 141 05/23/2021 1013   K 3.8 12/10/2021 0000   CL 105 12/10/2021 0000   CO2 23 12/09/2021 2352   GLUCOSE 129 (H) 12/10/2021 0000   BUN 8 12/10/2021 0000   BUN 9 05/23/2021 1013   CREATININE 0.80 12/10/2021 0000   CALCIUM 9.6 12/09/2021 2352   PROT 6.9 12/09/2021 2352   PROT 7.6 02/03/2020 1030   ALBUMIN 3.8 12/09/2021 2352   ALBUMIN 4.3 02/03/2020 1030   AST 26 12/09/2021 2352   ALT 34 12/09/2021 2352   ALKPHOS 93 12/09/2021 2352   BILITOT 0.5 12/09/2021 2352   BILITOT <0.2 02/03/2020 1030   GFRNONAA >60 12/09/2021 2352   GFRAA 98 02/03/2020 1030   Lipid Panel     Component Value Date/Time   CHOL 181 01/27/2020 0500   TRIG 69 01/27/2020 0500   HDL 42 01/27/2020 0500   CHOLHDL 4.3 01/27/2020 0500   VLDL 14 01/27/2020 0500   LDLCALC 125 (H) 01/27/2020 0500   Lab Results  Component Value Date   HGBA1C 6.2 (H) 12/10/2021   Drugs of Abuse     Component Value Date/Time   LABOPIA NONE DETECTED 12/10/2021 0214   COCAINSCRNUR NONE DETECTED 12/10/2021 0214   LABBENZ NONE DETECTED 12/10/2021 0214   AMPHETMU NONE DETECTED 12/10/2021 0214   THCU NONE DETECTED 12/10/2021 0214   LABBARB NONE DETECTED 12/10/2021 0214    Imaging I have reviewed the images obtained:  CT angio head neck wwo 8/6: Normal CTA of the head and neck.  MRI examination of the brain 8/6: 1. Small acute cortical infarcts scattered in the right MCA distribution. 2. Multiple remote infarcts with progression from 2021, especially in the superior right cerebellum.  Assessment:  46 year old female with past medical history of multiple cryptogenic strokes with recent discontinuation of Eliquis for history of DVT who presented to the ED for evaluation of acute onset of dysarthria.  MRI imaging revealed small acute cortical infarcts in the right MCA distribution with multiple remote infarct progression since 2021. -Examination reveals patient with chronic right upper extremity spastic hemiparesis, left hemibody sensory deficit, right lower facial weakness, and reported new onset of dysarthria.  -Imaging reveals progression  of cerebellar infarcts (non-acute), acute cortical right MCA distribution infarcts. -Of note, in the past patient had stroke recurrence 1 month after discontinuation of Eliquis in 2022.  Patient also with recent discontinuation of Eliquis at the end of July 2023 with stroke recurrence as evidenced by acute cortical infarcts identified on MRI.  Hypercoagulable work-up in the past is unremarkable.  ILR in place without evidence of atrial fibrillation x2 mos. -Stroke risk factors include obesity, history of tobacco use and cocaine use, history of CVA, obesity, hyperlipidemia, and history of DVT.  Impression: Acute cortical right MCA distribution infarcts Progression of remote infarctions History of cryptogenic CVAs  Recommendations: - Permissive HTN x48 hrs from sx onset or until stroke ruled out by MRI goal BP <220/110. PRN labetalol or hydralazine if BP above these parameters. Avoid oral antihypertensives. - CTA or MRA H&N - TTE w/ bubble - Consider consult to cardiology for re-evaluation of possible PFO associated stroke - Check A1c and LDL + add statin per guidelines - ASA 81mg  daily for now. Patient has now had 4 cryptogenic strokes (2 of the 4 associated with cocaine (+) UDS but not all). 2 of these strokes occurred within 1-4 weeks of discontinuing anticoagulation. Will defer decision re: empiric anticoagulation to stroke team who will provide further  guidance tomorrow. She is f/b Dr. as outpatient.  - q4 hr neuro checks - STAT head CT for any change in neuro exam - Tele - PT/OT/SLP - Stroke education - F/u with established outpatient neurologist Dr. Pearlean Brownie after hospital discharge  Stroke team will f/u tmrw  Pt seen by NP/Neuro and later by MD. Note/plan to be edited by MD as needed.  Pearlean Brownie, AGAC-NP [facility] Pager: [phone]   Attending Neurohospitalist Addendum Patient seen and examined with APP/Resident. Agree with the history and physical as documented above. Agree with the plan as documented, which I helped formulate. I have edited the note above to reflect my full findings and recommendations. I have independently reviewed the chart, obtained history, review of systems and examined the patient.I have personally reviewed pertinent head/neck/spine imaging (CT/MRI). Please feel free to call with any questions.  -- [phone], MD [facility] [phone]  If 7pm- 7am, please page neurology on call as listed in AMION.

## 2021-12-10 NOTE — Progress Notes (Signed)
Pt arrived to the floor around 1725 from ED via stretcher. Pt alert and oriented x4 in no acute. Pt ambulatory with assistance from stretcher to hospital bed. VSS. Respirations even and unlabored on room air. Pt oriented to room. Pt encouraged to call for assistance and not get out of bed without assistance. Bed in low position. Call bell within reach. Bed alarm on.

## 2021-12-10 NOTE — ED Notes (Signed)
Echo at bedside

## 2021-12-10 NOTE — ED Notes (Signed)
With CT 

## 2021-12-10 NOTE — H&P (Addendum)
Date: 12/10/2021               Patient Name:  Maria Porter MRN: [id number]  DOB: [birth year] Age / Sex: 46 y.o., female   PCP: Belva Agee, MD         Medical Service: Internal Medicine Teaching Service         Attending Physician: Dr. Reymundo Poll, MD    First Contact: Dr. Rocky Morel Pager: [phone]  Second Contact: Dr. Elenore Paddy Pager: [phone]       After Hours (After 5p/  First Contact Pager: [phone]  weekends / holidays): Second Contact Pager: [phone]   Chief Concern: Slurred speech  History of Present Illness:   Maria Porter is a 46 year old female with recurrent cryptogenic CVAs, history of DVTs, hypothyroidism, hyperlipidemia who presents to the hospital for symptoms of slurred speech and right arm weakness.  Patient reports symptoms started at 11 AM on 8/5.  She did not think that she had a stroke into her family member noticed and called ambulance.  Patient did not arrive to the hospital until 11 PM.  She states that her symptom has significantly improved.  She said dysarthria was main symptom.  She reports preserved strength of bilateral upper and lower extremities.  She has right arm weakness which is a residual neurological deficit from prior stroke but no acute change.  She has more sensation on the left side of her body but denies acute change.  She states that her speech is almost back to normal.  Otherwise she denies chest pain, shortness of breath, abdominal pain, issue with bowel meant or urination.  Endorses good p.o. intake. Patient report adherence to her medications.  Her Eliquis was stopped per neurology recommendation on 7/24.  10/2016: Left cerebellar CVA -Hypercoagulable workup: negative Lupus, factor V, Pro C 01/2020: Left frontal cortical CVA. TEE small PFO -Hypercoagulable workup: negative RPR, VDRL, Lupus, prothrombin, factor V, Pro C/S, antithrombin 3, ANA, ANCA.  09/2020: Left frontal/caudate/ putamen + Right frontal  CVA -S/p mechanical thrombectomy of Left MCA -Acute DVT: start Eliquis 5 mg BID 09/02/2021: Right cerebellar CVA. Right superior cerebellar artery occlusion -Patient stopped Eliquis 1 month prior to this admission 09/29/2021: Resume Eliquis at [facility]. Patient did not follow up with Hematology 11/27/2021: Stop eliquis.   In the ED, brain MRI showed a small acute cortical infarcts scattered in the right MCA.  CTA head and neck was unremarkable with no significant stenosis.  Meds:  Current Meds  Medication Sig   albuterol (PROVENTIL) (2.5 MG/3ML) 0.083% nebulizer solution Take 3 mLs (2.5 mg total) by nebulization every 4 (four) hours as needed for wheezing or shortness of breath.   aspirin EC 81 MG tablet Take 1 tablet (81 mg total) by mouth daily. Swallow whole. (Patient taking differently: Take 81 mg by mouth daily.)   atorvastatin (LIPITOR) 80 MG tablet Take 1 tablet (80 mg total) by mouth daily.   cetirizine (ZYRTEC) 10 MG tablet Take 10 mg by mouth daily.   ezetimibe (ZETIA) 10 MG tablet Take 1 tablet (10 mg total) by mouth daily.   fluticasone (FLONASE) 50 MCG/ACT nasal spray Place 1 spray into both nostrils daily.   levothyroxine (SYNTHROID) 150 MCG tablet Take 1 tablet (150 mcg total) by mouth daily.   ondansetron (ZOFRAN) 4 MG tablet Take 1 tablet (4 mg total) by mouth every 8 (eight) hours as needed for nausea or vomiting.   pantoprazole (PROTONIX) 40 MG tablet TAKE 1 TABLET BY MOUTH  EVERY DAY   polyethylene glycol (MIRALAX / GLYCOLAX) 17 g packet Take 17 g by mouth daily as needed for mild constipation or moderate constipation.   VENTOLIN HFA 108 (90 Base) MCG/ACT inhaler INHALE 1-2 PUFFS BY MOUTH EVERY 6 HOURS AS NEEDED FOR WHEEZE OR SHORTNESS OF BREATH (Patient taking differently: Inhale 1-2 puffs into the lungs every 6 (six) hours as needed for wheezing or shortness of breath.)     Allergies: Allergies as of 12/09/2021   (No Known Allergies)   Past Medical History:  Diagnosis  Date   Acute deep vein thrombosis of right iliac vein (HCC) 09/14/2020   Stroke [facility])     Family History:  Mother: Hypertension, passed away of heart attack Father: Passed away of heart attack  Social History:  Lives with her son right now.  Her son is visiting her for 1 month Independent of ADLs Do not work.  Used to be a Engineer, civil (consulting) for 14 years Denies alcohol use Smoke about 4 cigarettes a day No other substance use PCP: Dr. Neldon Newport  Review of Systems: A complete ROS was negative except as per HPI.   Physical Exam: Blood pressure 131/81, pulse 83, temperature 97.8 F (36.6 C), temperature source Oral, resp. rate 20, height 5\' 6"  (1.676 m), weight 100.2 kg, SpO2 100 %. Physical Exam HENT:     Head: Normocephalic.  Cardiovascular:     Rate and Rhythm: Normal rate and regular rhythm.     Heart sounds: No murmur heard.    Comments: No LE edema Pulmonary:     Effort: Pulmonary effort is normal. No respiratory distress.     Breath sounds: Normal breath sounds. No wheezing.  Abdominal:     General: Bowel sounds are normal. There is no distension.     Palpations: Abdomen is soft.     Tenderness: There is no abdominal tenderness.  Musculoskeletal:     Cervical back: Normal range of motion.  Skin:    General: Skin is warm.  Neurological:     Mental Status: She is alert and oriented to person, place, and time.     Comments: Mental status/Cognition: Alert, oriented, good attention. Speech/language: Dysarthric.  Cranial nerves:   CN II Pupils equal and reactive to light, no VF deficits  CN III,IV,VI EOM intact, no gaze preference or deviation, no nystagmus  CN V less sensation on the right side  CN VII  no asymmetry, no nasolabial fold flattening  CN VIII normal hearing to speech  CN IX & X No visualization of uvula  CN XI  5/5 head turn.  4/5 shoulder rug on the right and 5/5 on the left  CN XII  midline tongue protrusion 5/5 strength LUE 4/5 strength right grip, bicep and  tricep.  Unable to move her right shoulder 5/5 strength in bilateral lower extremity. Less sensation on the right side, which is not acute  Psychiatric:        Mood and Affect: Mood normal.      EKG: personally reviewed my interpretation is normal sinus rhythm   Assessment & Plan by Problem: Principal Problem:   CVA (cerebral vascular accident) (HCC) Active Problems:   History of CVA with residual deficit   Hyperlipemia   Subclinical hypothyroidism   History of DVT (deep vein thrombosis)   Samarra Ridgely is a 46 year old female with recurrent cryptogenic CVAs, history of DVTs, hypothyroidism, hyperlipidemia who presents to the hospital for symptoms of slurred speech and right arm weakness, admitted for an acute  infarct of the right MCA distribution.  Acute infarcts of MCA distribution Patient has has recurrent cryptogenic strokes.  Hypercoagulable work-up negative in 2018 and 2021.  Patient was not on anticoagulations during the test.  Only positive test for anticardiolipin ING of 14.  No atrial fibrillation has been captured on loop recorder.  TTE in 2021 showed a small PFO that is clinically insignificant.  Cardiology evaluated patient last month and thought there was no PFO. Unclear cause of her recurrent CVAs, but I can see a pattern of having a stroke after stopping Eliquis.  Scattered infarcts consistent with embolic etiology.  I suspect patient may need lifelong anticoagulation.  She will also need to follow-up with hematology for further work-up. -Pending neurology recommendations -Pending echocardiogram.  May need to get in touch with cardiology about the PFO if high suspicion on TTE. -Allow permissive hypertension -LDL and A1c at goal. Resume Lipitor and Zetia -Holding off on resuming Eliquis at this time to avoid hemorrhagic conversion -PT/OT/SLP.  N.p.o. for now -Telemetry  Hypothyroidism TSH elevated at 17. -Increase levothyroxine to 175 mcg daily -Will need f/u TSH  in 4-6 weeks.  Hyperlipidemia Resume atorvastatin and Zetia  CODE STATUS: DNR.  Patient states that this is consistent to her prior wishes as well. Diet: N.p.o. until SLP eval IVF: N/A DVT: SCDs for now.  Pending neuro recs about anticoagulation  Dispo: Admit patient to Inpatient with expected length of stay greater than 2 midnights.  SignedDoran Stabler, DO 12/10/2021, 10:15 AM  Pager: [phone] After 5pm on weekdays and 1pm on weekends: On Call pager: [phone]

## 2021-12-10 NOTE — ED Notes (Signed)
Back from MRI.

## 2021-12-10 NOTE — ED Notes (Addendum)
Patient states she has had 4 strokes in the past. Patient has right sided weakness in the arm from previous stroke and left sided numbness in the face, arm and leg from previous stroke. Patient states before today she only had slight slurring of speech from a previous stroke. Patient states today she feels like her tongue is heavier. Patient also has pupil changes from a previous stroke.

## 2021-12-10 NOTE — Evaluation (Signed)
Occupational Therapy Evaluation Patient Details Name: Maria Porter MRN: [id number] DOB: [birth year] Today's Date: 12/10/2021   History of Present Illness 46 year old female with recurrent cryptogenic CVAs, history of DVTs, hypothyroidism, hyperlipidemia who presents to the hospital for symptoms of slurred speech and right arm weakness.   Clinical Impression   Patient admitted for the diagnosis above.  PTA she lives alone, but can arrange initial 24 hour assist as needed.  She normally uses a RW in the community, relies on public transportation, and can perform her own ADL, iADL.  Patient endorses that she believes she is at her baseline, and most symptoms have resolved.  She would like to continue outpatient OT once discharged.  OT will follow in the acute setting.        Recommendations for follow up therapy are one component of a multi-disciplinary discharge planning process, led by the attending physician.  Recommendations may be updated based on patient status, additional functional criteria and insurance authorization.   Follow Up Recommendations  Outpatient OT    Assistance Recommended at Discharge Set up Supervision/Assistance  Patient can return home with the following Assist for transportation    Functional Status Assessment  Patient has had a recent decline in their functional status and demonstrates the ability to make significant improvements in function in a reasonable and predictable amount of time.  Equipment Recommendations  None recommended by OT    Recommendations for Other Services       Precautions / Restrictions Precautions Precautions: Fall Restrictions Weight Bearing Restrictions: No      Mobility Bed Mobility Overal bed mobility: Modified Independent                  Transfers Overall transfer level: Needs assistance   Transfers: Sit to/from Stand Sit to Stand: Modified independent (Device/Increase time)                  Balance  Overall balance assessment: Mild deficits observed, not formally tested                                         ADL either performed or assessed with clinical judgement   ADL       Grooming: Wash/dry hands;Supervision/safety;Standing               Lower Body Dressing: Supervision/safety;Sit to/from stand   Toilet Transfer: Supervision/safety;Rolling walker (2 wheels);Ambulation;Regular Toilet                   Vision Patient Visual Report: No change from baseline       Perception Perception Perception: Within Functional Limits   Praxis Praxis Praxis: Intact    Pertinent Vitals/Pain Pain Assessment Pain Assessment: No/denies pain     Hand Dominance Left   Extremity/Trunk Assessment Upper Extremity Assessment Upper Extremity Assessment: RUE deficits/detail RUE Deficits / Details: limited active shoulder flexion, can bring her hand to her mouth RUE Sensation: decreased light touch RUE Coordination: decreased fine motor;decreased gross motor   Lower Extremity Assessment Lower Extremity Assessment: Defer to PT evaluation   Cervical / Trunk Assessment Cervical / Trunk Assessment: Normal   Communication Communication Communication: Expressive difficulties   Cognition Arousal/Alertness: Awake/alert Behavior During Therapy: WFL for tasks assessed/performed Overall Cognitive Status: No family/caregiver present to determine baseline cognitive functioning  General Comments: following commands well, demonstrates good safety, A&O     General Comments   VSS on RA    Exercises     Shoulder Instructions      Home Living Family/patient expects to be discharged to:: Private residence Living Arrangements: Alone Available Help at Discharge: Family;Available 24 hours/day Type of Home: House Home Access: Stairs to enter [employer] of Steps: 3 Entrance Stairs-Rails: Left Home Layout:  One level     Bathroom Shower/Tub: Chief Strategy Officer: Standard Bathroom Accessibility: Yes How Accessible: Accessible via walker Home Equipment: Rolling Walker (2 wheels);BSC/3in1;Tub bench   Additional Comments: does not use 3n1      Prior Functioning/Environment Prior Level of Function : Independent/Modified Independent             Mobility Comments: patient uses her RW in the community, does not use the RW in the home ADLs Comments: Completes her own ADL and iADL, no longer drives, but arranges transportation, has groceries delivered.  Manages her medications.        OT Problem List: Decreased strength;Decreased range of motion;Impaired UE functional use      OT Treatment/Interventions: Self-care/ADL training;Therapeutic exercise;Therapeutic activities    OT Goals(Current goals can be found in the care plan section) Acute Rehab OT Goals Patient Stated Goal: Keep getting stronger OT Goal Formulation: With patient Time For Goal Achievement: 12/25/21 Potential to Achieve Goals: Good ADL Goals Pt Will Perform Lower Body Dressing: Independently;sit to/from stand Pt Will Transfer to Toilet: Independently;ambulating;regular height toilet Pt/caregiver will Perform Home Exercise Program: Increased strength;Increased ROM;Right Upper extremity;With minimal assist  OT Frequency: Min 2X/week    Co-evaluation              AM-PAC OT "6 Clicks" Daily Activity     Outcome Measure Help from another person eating meals?: None Help from another person taking care of personal grooming?: None Help from another person toileting, which includes using toliet, bedpan, or urinal?: A Little Help from another person bathing (including washing, rinsing, drying)?: A Little Help from another person to put on and taking off regular upper body clothing?: None Help from another person to put on and taking off regular lower body clothing?: A Little 6 Click Score: 21   End  of Session Equipment Utilized During Treatment: Rolling walker (2 wheels) Nurse Communication: Mobility status  Activity Tolerance: Patient tolerated treatment well Patient left: in bed;with call bell/phone within reach  OT Visit Diagnosis: Muscle weakness (generalized) (M62.81)                Time: 7846-9629 OT Time Calculation (min): 22 min Charges:  OT General Charges $OT Visit: 1 Visit OT Evaluation $OT Eval Moderate Complexity: 1 Mod  12/10/2021  RP, OTR/L  Acute Rehabilitation Services  Office:  [phone]   Suzanna Obey 12/10/2021, 3:35 PM

## 2021-12-10 NOTE — ED Notes (Signed)
SLP at bedside for evaluation.

## 2021-12-10 NOTE — Evaluation (Signed)
Speech Language Pathology Evaluation Patient Details Name: Maria Porter MRN: [id number] DOB: 07/29/1975 Today's Date: 12/10/2021 Time: 2542-7062 SLP Time Calculation (min) (ACUTE ONLY): 23 min  Problem List:  Patient Active Problem List   Diagnosis Date Noted   CVA (cerebral vascular accident) (HCC) 12/10/2021   GERD (gastroesophageal reflux disease) 07/20/2021   Right shoulder pain 05/23/2021   Healthcare maintenance 03/07/2021   History of DVT (deep vein thrombosis) 10/06/2020   Family history of premature coronary heart disease 09/14/2020   OSA (obstructive sleep apnea) 09/14/2020   Pre-diabetes 02/03/2020   Subclinical hypothyroidism 02/03/2020   Tobacco use disorder 01/27/2020   Hyperlipemia 01/27/2020   Obesity 01/27/2020   History of CVA with residual deficit 01/26/2020   Extrinsic asthma 10/09/2004   Past Medical History:  Past Medical History:  Diagnosis Date   Acute deep vein thrombosis of right iliac vein (HCC) 09/14/2020   Stroke [facility])    Past Surgical History:  Past Surgical History:  Procedure Laterality Date   BUBBLE STUDY  01/29/2020   Procedure: BUBBLE STUDY;  Surgeon: Little Ishikawa, MD;  Location: [facility] ENDOSCOPY;  Service: Cardiovascular;;   TEE WITHOUT CARDIOVERSION N/A 01/29/2020   Procedure: TRANSESOPHAGEAL ECHOCARDIOGRAM (TEE);  Surgeon: Little Ishikawa, MD;  Location: [facility] ENDOSCOPY;  Service: Cardiovascular;  Laterality: N/A;   HPI:  Pt is a 46 year old female who presented to the ED with slurred speech and right arm weakness. MRI brain: Small acute cortical infarcts scattered in the right MCA  distribution. PMH: recurrent cryptogenic CVAs, history of DVTs, hypothyroidism, hyperlipidemia. Pt currently receiving outpatient SLP services with most recent with diagnoses of aphasia, dysarthria, foreign accent syndrome as of 11/30/21.   Assessment / Plan / Recommendation Clinical Impression  Pt participated in speech-language-cognition  evaluation. Pt reported that she has an associate's degreee and was previously employed as an Public house manager. Pt stated that she typically has word retrieval difficulty and has been diagnosed with foreign accent syndrome, but that her speech is typically more intelligible and she has been having increased difficulty with memory. The [facility] Mental Status Examination was completed to evaluate the pt's cognitive-linguistic skills. Sh achieved a score of 21/30 which is below the normal limits of 27 or more out of 30 and is suggestive of a mild impairment. She exhibited difficulty in the areas of memory, and executive function. She also presented with moderate to severe dysarthria characterized by reduced articulatory precision and a mildly hoarse vocal quality which negatively impacted speech intelligibility. Word retrieval difficulty was intermittently noted during conversation, but pt expressed that her language skills were at baseline. Skilled SLP services are clinically indicated at this time to improve motor speech and cognitive-linguistic function.    SLP Assessment  SLP Recommendation/Assessment: Patient needs continued Speech Lanaguage Pathology Services SLP Visit Diagnosis: Cognitive communication deficit (R41.841);Dysarthria and anarthria (R47.1);Other (comment) (foreign accent syndrome)    Recommendations for follow up therapy are one component of a multi-disciplinary discharge planning process, led by the attending physician.  Recommendations may be updated based on patient status, additional functional criteria and insurance authorization.    Follow Up Recommendations   (Continued SLP services at level of care recommended by PT/OT)    Assistance Recommended at Discharge  Intermittent Supervision/Assistance  Functional Status Assessment    Frequency and Duration min 2x/week  2 weeks      SLP Evaluation Cognition  Overall Cognitive Status: Impaired/Different from  baseline Arousal/Alertness: Awake/alert Orientation Level: Oriented X4 Year: 2023 Month: August Day of  Week: Correct Attention: Focused;Sustained Focused Attention: Appears intact Sustained Attention: Appears intact Memory: Impaired Memory Impairment: Retrieval deficit (Immediate: 5/5 with repetition x3; delayed: 4/5; paragraph: 4/8) Awareness: Appears intact Problem Solving: Impaired Problem Solving Impairment: Verbal complex (time: 1/2; money: 1/3) Executive Function: Sequencing;Organizing Sequencing:  (clock: 2/4) Organizing:  (backward digit span: 2/2)       Comprehension  Auditory Comprehension Yes/No Questions: Impaired Basic Immediate Environment Questions:  (5/5) Complex Questions:  (4/5) Commands: Within Functional Limits Two Step Basic Commands:  (4/4) Multistep Basic Commands:  (2/2)    Expression Expression Primary Mode of Expression: Verbal Verbal Expression Overall Verbal Expression: Impaired at baseline Initiation: Impaired Level of Generative/Spontaneous Verbalization: Conversation Naming: Impairment Responsive:  (5/5 with additional processing time) Confrontation:  (5/5) Divergent:  (5 in one minute)   Oral / Motor  Oral Motor/Sensory Function Overall Oral Motor/Sensory Function: Mild impairment Facial ROM: Reduced right;Suspected CN VII (facial) dysfunction Facial Symmetry: Within Functional Limits Facial Strength: Reduced right;Suspected CN VII (facial) dysfunction Lingual Symmetry: Within Functional Limits Lingual Strength: Reduced;Suspected CN XII (hypoglossal) dysfunction Motor Speech Overall Motor Speech: Impaired Respiration: Within functional limits Phonation: Hoarse Resonance: Within functional limits Articulation: Impaired Intelligibility: Intelligibility reduced Word: 75-100% accurate Phrase: 75-100% accurate Sentence: 50-74% accurate Conversation: 50-74% accurate Motor Planning: Witnin functional limits Motor Speech Errors:  Aware;Consistent           Jeramine Delis I. Vear Clock, MS, CCC-SLP Acute Rehabilitation Services Office number [phone]  Scheryl Marten 12/10/2021, 1:48 PM

## 2021-12-10 NOTE — ED Notes (Signed)
Patient given warm blankets.

## 2021-12-10 NOTE — Hospital Course (Addendum)
Problem-based hospital course:  Acute infarcts in the R MCA distribution Progression of remote infarcts H/o recurrent cryptogenic CVAs Patient presented on 8/6 with new onset dysarthria. She also has chronic neuro deficits from her prior infarcts including right facial weakness, RUE hemiparesis, and left-sided sensory deficits of the body. She was otherwise hemodynamically and neurologically stable this admission. MRI brain on 8/6 showed small acute cortical infarcts in the right MCA distribution with multiple remote infarct progression since 2021. Labs were unremarkable. Bubble echo on 8/6 showed no shunting. ILR was checked and showed no episodes of afib. Given that her eliquis was recently stopped and she has previously had CVAs after stopping anticoagulation, there remains concern that the underlying etiology is embolic although she has undergone extensive workup without revealing a source. It is unclear if her TEE in 2021 truly showed a small PFO. Neurology was consulted and ordered recheck of her hypercoagulability workup. Patient will likely need lifelong anticoagulation. She was discharged on aspirin and eliquis and will have close follow-up with neurology as outpatient.   Hypothyroidism  TSH 17 on 8/6. Her levothyroxine was increased to 175 mcg daily. TSH should be rechecked in 4-6 weeks.  Hyperlipidemia Home atorvastatin and zetia were continued.  ---------------------------------  Subjective NAEON, VSS. Patient reports feeling generally well today. Her speech has returned to how it was before this hospitalization.She denies any dizziness, lightheadedness, headaches, chest pain, abdominal pain, N/V, or shortness of breath.    Objective BP 127/83   Pulse 81   Temp 98.5 F (36.9 C)   Resp 16   Ht 5\' 6"  (1.676 m)   Wt 98.2 kg   SpO2 100%   BMI 34.94 kg/m   Constitutional: Alert and oriented. Well appearing. No acute distress. Converses and answers questions appropriately. Eyes:  Sclera anicteric. EOMI grossly intact. HENT: Normocephalic and atraumatic. MMM. Neck: No cervical lymphadenopathy or thyromegaly. No JVD. Cardiovascular: RRR. No M/R/G. Respiratory: CTAB. No crackles or wheezes. No use of accessory muscles. Gastrointestinal: Soft. NT/ND. Normoactive bowel sounds. No rebound or guarding. Musculoskeletal: No clubbing, cyanosis, or edema. +2 pulses in bilateral upper and lower extremities. Skin: Skin is warm, dry, and intact. No rashes or lesions noted on clothed exam. Psychiatric: Normal mood and affect. Normal speech and behavior. Neuro:  - Mental status: Alert and oriented x4.  - Speech and language: Dysarthria, chronic. No aphasia.  - CN II: PERRL.  - CN III, IV, VI: EOMI. No nystagmus.  - CN V: Decreased sensation of the L face, chronic. - CN VII: R lower facial weakness, chronic.  - CN VIII: Hearing intact to voice. - CN IX, X: Symmetric palate elevation. Normal phonation.  - CN XI: 4/5 strength of the R side, chronic. 5/5 strength of the L. - CN XII: Tongue protrusion at midline, no fasciculations.  - Motor: 4/5 strength of the RUE, limited ROM, chronic. 5/5 Strength of the LUE, no vertical drift. RLE and LLE w/o vertical drift.  - Sensation: Decreased sensation of the L face, arm, and leg, chronic.  - Coordination: No overt ataxia noted.  - DTRs: Deferred - Gait: Deferred

## 2021-12-10 NOTE — Progress Notes (Signed)
  Echocardiogram 2D Echocardiogram has been performed.  Maria Porter 12/10/2021, 4:33 PM

## 2021-12-10 NOTE — Evaluation (Signed)
Clinical/Bedside Swallow Evaluation Patient Details  Name: Maria Porter MRN: [id number] Date of Birth: 1975-08-10  Today's Date: 12/10/2021 Time: SLP Start Time (ACUTE ONLY): 1202 SLP Stop Time (ACUTE ONLY): 1218 SLP Time Calculation (min) (ACUTE ONLY): 16 min  Past Medical History:  Past Medical History:  Diagnosis Date   Acute deep vein thrombosis of right iliac vein (HCC) 09/14/2020   Stroke [facility])    Past Surgical History:  Past Surgical History:  Procedure Laterality Date   BUBBLE STUDY  01/29/2020   Procedure: BUBBLE STUDY;  Surgeon: Maria Ishikawa, MD;  Location: [facility] ENDOSCOPY;  Service: Cardiovascular;;   TEE WITHOUT CARDIOVERSION N/A 01/29/2020   Procedure: TRANSESOPHAGEAL ECHOCARDIOGRAM (TEE);  Surgeon: Maria Ishikawa, MD;  Location: [facility] ENDOSCOPY;  Service: Cardiovascular;  Laterality: N/A;   HPI:  Pt is a 46 year old female who presented to the ED with slurred speech and right arm weakness. MRI brain: Small acute cortical infarcts scattered in the right MCA  distribution. PMH: recurrent cryptogenic CVAs, history of DVTs, hypothyroidism, hyperlipidemia. Pt currently receiving outpatient SLP services with most recent with diagnoses of aphasia, dysarthria, foreign accent disorder as of 11/30/21.    Assessment / Plan / Recommendation  Clinical Impression  Pt was seen in the ED for bedside swallow evaluation. She reported that she has a history of dysphagia since CVA in 2022 and has been instructed (by SLPs in [place]) to only drink from a straw, but this recommendation could not be found in Care Everywhere. She tolerated all solids and liquids without immediate symptoms of oropharyngeal dysphagia. A mildly wet vocal quality was noted during the communication evaluation that immediately proceeded the swallow evaluation, but this could not be replicated with provision of additional trials. A regular texture diet with thin liquids is recommended at this time and SLP  will follow briefly to ensure tolerance. SLP Visit Diagnosis: Dysphagia, unspecified (R13.10)    Aspiration Risk  Mild aspiration risk    Diet Recommendation Regular;Thin liquid   Liquid Administration via: Cup;Straw Medication Administration: Whole meds with liquid Supervision: Patient able to self feed;Intermittent supervision to cue for compensatory strategies Compensations: Slow rate;Small sips/bites Postural Changes: Seated upright at 90 degrees    Other  Recommendations Oral Care Recommendations: Oral care BID    Recommendations for follow up therapy are one component of a multi-disciplinary discharge planning process, led by the attending physician.  Recommendations may be updated based on patient status, additional functional criteria and insurance authorization.  Follow up Recommendations  (Continued SLP services at level of care recommended by PT/OT)      Assistance Recommended at Discharge Intermittent Supervision/Assistance  Functional Status Assessment    Frequency and Duration min 1 x/week  1 week       Prognosis Prognosis for Safe Diet Advancement: Good      Swallow Study   General Date of Onset: 12/09/21 HPI: Pt is a 46 year old female who presented to the ED with slurred speech and right arm weakness. MRI brain: Small acute cortical infarcts scattered in the right MCA  distribution. PMH: recurrent cryptogenic CVAs, history of DVTs, hypothyroidism, hyperlipidemia. Pt currently receiving outpatient SLP services with most recent with diagnoses of aphasia, dysarthria, foreign accent disorder as of 11/30/21. Type of Study: Bedside Swallow Evaluation Previous Swallow Assessment: None Diet Prior to this Study: NPO Temperature Spikes Noted: No Respiratory Status: Room air History of Recent Intubation: No Behavior/Cognition: Alert;Cooperative;Pleasant mood Oral Cavity Assessment: Within Functional Limits Oral Care Completed by SLP: No Oral Cavity -  Dentition:  Missing dentition;Poor condition Vision: Functional for self-feeding Self-Feeding Abilities: Able to feed self Patient Positioning: Upright in bed;Postural control adequate for testing Baseline Vocal Quality: Low vocal intensity Volitional Cough: Strong Volitional Swallow: Able to elicit    Oral/Motor/Sensory Function Overall Oral Motor/Sensory Function: Mild impairment Facial ROM: Reduced right;Suspected CN VII (facial) dysfunction Facial Symmetry: Within Functional Limits Facial Strength: Reduced right;Suspected CN VII (facial) dysfunction Lingual Symmetry: Within Functional Limits Lingual Strength: Reduced;Suspected CN XII (hypoglossal) dysfunction   Ice Chips Ice chips: Within functional limits Presentation: Spoon   Thin Liquid Thin Liquid: Within functional limits Presentation: Straw;Cup    Nectar Thick Nectar Thick Liquid: Not tested   Honey Thick Honey Thick Liquid: Not tested   Puree Puree: Within functional limits Presentation: Spoon   Solid     Solid: Within functional limits Presentation: Self Fed     Maria Porter I. Vear Clock, MS, CCC-SLP Acute Rehabilitation Services Office number [phone]  Maria Porter 12/10/2021,1:15 PM

## 2021-12-11 DIAGNOSIS — E038 Other specified hypothyroidism: Secondary | ICD-10-CM

## 2021-12-11 DIAGNOSIS — E7800 Pure hypercholesterolemia, unspecified: Secondary | ICD-10-CM | POA: Diagnosis not present

## 2021-12-11 DIAGNOSIS — I63411 Cerebral infarction due to embolism of right middle cerebral artery: Secondary | ICD-10-CM | POA: Diagnosis not present

## 2021-12-11 DIAGNOSIS — I6930 Unspecified sequelae of cerebral infarction: Secondary | ICD-10-CM | POA: Diagnosis not present

## 2021-12-11 DIAGNOSIS — Z86718 Personal history of other venous thrombosis and embolism: Secondary | ICD-10-CM | POA: Diagnosis not present

## 2021-12-11 LAB — CBC
HCT: 40.8 % (ref 36.0–46.0)
Hemoglobin: 13.5 g/dL (ref 12.0–15.0)
MCH: 28.9 pg (ref 26.0–34.0)
MCHC: 33.1 g/dL (ref 30.0–36.0)
MCV: 87.4 fL (ref 80.0–100.0)
Platelets: 360 10*3/uL (ref 150–400)
RBC: 4.67 MIL/uL (ref 3.87–5.11)
RDW: 12.5 % (ref 11.5–15.5)
WBC: 7.1 10*3/uL (ref 4.0–10.5)
nRBC: 0 % (ref 0.0–0.2)

## 2021-12-11 LAB — BASIC METABOLIC PANEL
Anion gap: 9 (ref 5–15)
BUN: 11 mg/dL (ref 6–20)
CO2: 22 mmol/L (ref 22–32)
Calcium: 9.5 mg/dL (ref 8.9–10.3)
Chloride: 106 mmol/L (ref 98–111)
Creatinine, Ser: 0.88 mg/dL (ref 0.44–1.00)
GFR, Estimated: >60 mL/min (ref >60)
Glucose, Bld: 121 mg/dL — ABNORMAL HIGH (ref 70–99)
Potassium: 3.9 mmol/L (ref 3.5–5.1)
Sodium: 137 mmol/L (ref 135–145)

## 2021-12-11 LAB — ANTITHROMBIN III: AntiThromb III Func: 115 % (ref 75–120)

## 2021-12-11 MED ORDER — ASPIRIN 325 MG PO TBEC
325.0000 mg | DELAYED_RELEASE_TABLET | Freq: Every day | ORAL | Status: DC
  Administered 2021-12-11 – 2021-12-12 (×2): 325 mg via ORAL
  Filled 2021-12-11 (×2): qty 1

## 2021-12-11 MED ORDER — ENOXAPARIN SODIUM 60 MG/0.6ML IJ SOSY
50.0000 mg | PREFILLED_SYRINGE | INTRAMUSCULAR | Status: DC
Start: 2021-12-11 — End: 2021-12-12
  Administered 2021-12-11: 50 mg via SUBCUTANEOUS
  Filled 2021-12-11: qty 0.6

## 2021-12-11 NOTE — Progress Notes (Signed)
SUBJECTIVE  Chief complaint Slurred speech  Summary Cythina Mickelsen is a 46 y.o. female with PMHx of recurrent cryptogenic CVAs who presented to [facility] ED on 12/09/2021 with slurred speech concerning for stroke.   Overnight events NAEON, VSS.   Today's interview Pt reports she feels generally well today. Her speech has seemed to return to how it was before yesterday's event. She denies any dizziness, lightheadedness, headaches, chest pain, abdominal pain, N/V, or shortness of breath.   Review of systems A complete ROS was negative except as per HPI.  OBJECTIVE  Vitals BP 105/68 (BP Location: Left Arm)   Pulse 69   Temp 97.9 F (36.6 C) (Oral)   Resp 16   Ht 5\' 6"  (1.676 m)   Wt 100.2 kg   SpO2 100%   BMI 35.65 kg/m   Physical exam Constitutional: Alert and oriented. Well appearing. No acute distress. Converses and answers questions appropriately. Eyes: Sclera anicteric. EOMI grossly intact. HENT: Normocephalic and atraumatic. MMM. Neck: No cervical lymphadenopathy or thyromegaly. No JVD. Cardiovascular: RRR. No M/R/G. Respiratory: CTAB. No crackles or wheezes. No use of accessory muscles. Gastrointestinal: Soft. NT/ND. Normoactive bowel sounds. No rebound or guarding. Musculoskeletal: No clubbing, cyanosis, or edema. +2 pulses in bilateral upper and lower extremities. Skin: Skin is warm, dry, and intact. No rashes or lesions noted on clothed exam. Psychiatric: Normal mood and affect. Normal speech and behavior. Neuro:  - Mental status: Alert and oriented x4.  - Speech and language: Dysarthria, chronic. No aphasia.  - CN II: PERRL.  - CN III, IV, VI: EOMI. No nystagmus.  - CN V: Decreased sensation of the L face, chronic. - CN VII: R lower facial weakness, chronic.  - CN VIII: Hearing intact to voice. - CN IX, X: Symmetric palate elevation. Normal phonation.  - CN XI: 4/5 strength of the R side, chronic. 5/5 strength of the L. - CN XII: Tongue protrusion at  midline, no fasciculations.  - Motor: 4/5 strength of the RUE, limited ROM, chronic. 5/5 Strength of the LUE, no vertical drift. RLE and LLE w/o vertical drift.  - Sensation: Decreased sensation of the L face, arm, and leg, chronic.  - Coordination: No overt ataxia noted.  - DTRs: Deferred - Gait: Deferred  Labs, studies, and imaging Labs, studies, and imaging from the last 24 hours per EMR and personally reviewed.  Results for orders placed or performed during the hospital encounter of 12/09/21 (from the past 24 hour(s))  Basic metabolic panel     Status: Abnormal   Collection Time: 12/11/21  8:46 AM  Result Value Ref Range   Sodium 137 135 - 145 mmol/L   Potassium 3.9 3.5 - 5.1 mmol/L   Chloride 106 98 - 111 mmol/L   CO2 22 22 - 32 mmol/L   Glucose, Bld 121 (H) 70 - 99 mg/dL   BUN 11 6 - 20 mg/dL   Creatinine, Ser 02/10/22 0.44 - 1.00 mg/dL   Calcium 9.5 8.9 - 6.21 mg/dL   GFR, Estimated 30.8 >65 mL/min   Anion gap 9 5 - 15  CBC     Status: None   Collection Time: 12/11/21  8:46 AM  Result Value Ref Range   WBC 7.1 4.0 - 10.5 K/uL   RBC 4.67 3.87 - 5.11 MIL/uL   Hemoglobin 13.5 12.0 - 15.0 g/dL   HCT 02/10/22 46.9 - 62.9 %   MCV 87.4 80.0 - 100.0 fL   MCH 28.9 26.0 - 34.0 pg  MCHC 33.1 30.0 - 36.0 g/dL   RDW 38.7 56.4 - 33.2 %   Platelets 360 150 - 400 K/uL   nRBC 0.0 0.0 - 0.2 %  Antithrombin III     Status: None   Collection Time: 12/11/21  8:46 AM  Result Value Ref Range   AntiThromb III Func 115 75 - 120 %   Echocardiogram bubble study, 12/10/2021: 1. Left ventricular ejection fraction, by estimation, is 60 to 65%. The left ventricle has normal function. The left ventricle has no regional wall motion abnormalities. There is mild left ventricular hypertrophy of the basal-septal segment. Left ventricular diastolic parameters were normal. 2. Right ventricular systolic function is normal. The right ventricular size is normal. 3. The mitral valve is normal in structure. No evidence of  mitral valve regurgitation. No evidence of mitral stenosis. 4. The aortic valve is tricuspid. Aortic valve regurgitation is not visualized. No aortic stenosis is present. 5. Agitated saline contrast bubble study was negative, with no evidence of any interatrial shunt.   ASSESSMENT & PLAN  Ahnna Dungan is a 46 y.o. female with PMHx of recurrent cryptogenic CVA, prior R external iliac DVT, hyperlipidemia, prediabetes, tobacco use, and cocaine use who is admitted for workup and management of acute cortical infarcts in the R MCA distribution.   Hospital day: 2  Principal Problem:   CVA (cerebral vascular accident) [facility]) Active Problems:   History of CVA with residual deficit   Hyperlipemia   Subclinical hypothyroidism   History of DVT (deep vein thrombosis)  Acute infarcts in the R MCA distribution Progression of remote infarcts H/o recurrent cryptogenic CVAs Pt presented on 8/6 with new onset dysarthria. She also has chronic neuro deficits from her prior infarcts including right facial weakness, RUE hemiparesis, and left-sided sensory deficits of the body. She has otherwise been stable. MRI brain on 8/6 showed small acute cortical infarcts in the right MCA distribution with multiple remote infarct progression since 2021. Labs were unremarkable. Bubble echo on 8/6 showed no shunting. ILR was checked today and showed no episodes of afib. Given that her eliquis was recently stopped and she has previously had CVAs after stopping anticoagulation, there remains concern that the underlying etiology is embolic although she has undergone extensive workup without revealing a source. It is unclear if her TEE in 2021 truly showed a small PFO. We will continue workup for her recurrent cryptogenic CVAs this admission and plan for outpatient management.  - Neurology consulted, appreciate recs - Ordered recheck of coagulation labs  - Plan for lifelong anticoagulation - regimen TBD. Continue ASA 81 mg daily at  this time - Consider cardiology consult for re-evaluation of possible PFO - Close outpatient f/u  - Telemetry - PT/OT/SLP  Chronic/ stable problems Hypothyroidism: TSH 17 on 8/6. Increased levothyroxine to 175 mcg daily. F/u TSH in 4-6 weeks Hyperlipidemia: Resumed atorvastatin and zetia  Checklist Diet: Regular IVF: None DVT PPx: SCDs Electrolytes: Replete as indicated Code status: DNR Dispo: Continue admission on floor  ------------------------  Kalman Drape, MS4 12/11/2021 Pager: [phone]

## 2021-12-11 NOTE — Progress Notes (Addendum)
STROKE TEAM PROGRESS NOTE   INTERVAL HISTORY Her sister and son are at the bedside. No acute events overnight. Patient reports she stopped taking eliquis 2 weeks ago after imaging (7/18) did not reveal DVTs. She was admitted after her son noticed slurred speech. Patient notes CVA whenever she discontinues AC. Discussed that we would repeat hypercoagulability w/u given recurrent stroke. Discussed possible need for lifelong AC.   MRI with multiple acute small cortical infarcts in R MCA distribution.   Vitals:   12/10/21 1745 12/11/21 0000 12/11/21 0400 12/11/21 0845  BP:  132/82 105/68 (!) 122/101  Pulse:  93 69 93  Resp:  16 16 17   Temp:  97.7 F (36.5 C) 97.9 F (36.6 C) 97.7 F (36.5 C)  TempSrc:  Oral Oral Oral  SpO2:  100% 100% 100%  Weight: 100.2 kg     Height: 5\' 6"  (1.676 m)      CBC:  Recent Labs  Lab 12/09/21 2352 12/10/21 0000 12/11/21 0846  WBC 6.5  --  7.1  NEUTROABS 3.6  --   --   HGB 13.6 13.9 13.5  HCT 41.6 41.0 40.8  MCV 88.9  --  87.4  PLT 344  --  360   Basic Metabolic Panel:  Recent Labs  Lab 12/09/21 2352 12/10/21 0000 12/11/21 0846  NA 138 140 137  K 3.8 3.8 3.9  CL 107 105 106  CO2 23  --  22  GLUCOSE 133* 129* 121*  BUN 8 8 11   CREATININE 0.82 0.80 0.88  CALCIUM 9.6  --  9.5   Lipid Panel:  Recent Labs  Lab 12/10/21 0954  CHOL 125  TRIG 68  HDL 50  CHOLHDL 2.5  VLDL 14  LDLCALC 61   HgbA1c:  Recent Labs  Lab 12/10/21 0954  HGBA1C 6.2*   Urine Drug Screen:  Recent Labs  Lab 12/10/21 0214  LABOPIA NONE DETECTED  COCAINSCRNUR NONE DETECTED  LABBENZ NONE DETECTED  AMPHETMU NONE DETECTED  THCU NONE DETECTED  LABBARB NONE DETECTED    Alcohol Level  Recent Labs  Lab 12/09/21 2352  ETH <10    IMAGING past 24 hours ECHOCARDIOGRAM COMPLETE BUBBLE STUDY  Result Date: 12/10/2021    ECHOCARDIOGRAM REPORT   Patient Name:   ROSELYNE STALNAKER Date of Exam: 12/10/2021 Medical Rec #:  02/09/2022       Height:       66.0 in Accession  #:    Novella Olive      Weight:       221.0 lb Date of Birth:  1975-08-17       BSA:          2.086 m Patient Age:    46 years        BP:           138/90 mmHg Patient Gender: F               HR:           84 bpm. Exam Location:  Inpatient Procedure: 2D Echo, Cardiac Doppler, Color Doppler and Saline Contrast Bubble            Study Indications:    CVA  History:        Patient has prior history of Echocardiogram examinations. TEE                 with bubble 01/29/20. TCD with bubble 01/28/20. Multiple strokes.  Sonographer:    01-08-2006 RDCS Referring Phys:  [id number] Kara Mead IMPRESSIONS  1. Left ventricular ejection fraction, by estimation, is 60 to 65%. The left ventricle has normal function. The left ventricle has no regional wall motion abnormalities. There is mild left ventricular hypertrophy of the basal-septal segment. Left ventricular diastolic parameters were normal.  2. Right ventricular systolic function is normal. The right ventricular size is normal.  3. The mitral valve is normal in structure. No evidence of mitral valve regurgitation. No evidence of mitral stenosis.  4. The aortic valve is tricuspid. Aortic valve regurgitation is not visualized. No aortic stenosis is present.  5. Agitated saline contrast bubble study was negative, with no evidence of any interatrial shunt. FINDINGS  Left Ventricle: Left ventricular ejection fraction, by estimation, is 60 to 65%. The left ventricle has normal function. The left ventricle has no regional wall motion abnormalities. The left ventricular internal cavity size was normal in size. There is  mild left ventricular hypertrophy of the basal-septal segment. Left ventricular diastolic parameters were normal. Right Ventricle: The right ventricular size is normal. Right ventricular systolic function is normal. Left Atrium: Left atrial size was normal in size. Right Atrium: Right atrial size was normal in size. Pericardium: Trivial pericardial effusion is present.  Mitral Valve: The mitral valve is normal in structure. Mild mitral annular calcification. No evidence of mitral valve regurgitation. No evidence of mitral valve stenosis. Tricuspid Valve: The tricuspid valve is normal in structure. Tricuspid valve regurgitation is trivial. No evidence of tricuspid stenosis. Aortic Valve: The aortic valve is tricuspid. Aortic valve regurgitation is not visualized. No aortic stenosis is present. Pulmonic Valve: The pulmonic valve was normal in structure. Pulmonic valve regurgitation is not visualized. No evidence of pulmonic stenosis. Aorta: The aortic root is normal in size and structure. Venous: The inferior vena cava was not well visualized. IAS/Shunts: No atrial level shunt detected by color flow Doppler. Agitated saline contrast was given intravenously to evaluate for intracardiac shunting. Agitated saline contrast bubble study was negative, with no evidence of any interatrial shunt.  LEFT VENTRICLE PLAX 2D LVIDd:         3.70 cm   Diastology LVIDs:         2.50 cm   LV e' medial:    8.59 cm/s LV PW:         1.10 cm   LV E/e' medial:  7.7 LV IVS:        1.30 cm   LV e' lateral:   10.60 cm/s LVOT diam:     2.10 cm   LV E/e' lateral: 6.2 LV SV:         84 LV SV Index:   40 LVOT Area:     3.46 cm  RIGHT VENTRICLE RV Basal diam:  2.40 cm RV S prime:     14.70 cm/s TAPSE (M-mode): 2.4 cm LEFT ATRIUM             Index        RIGHT ATRIUM           Index LA diam:        3.60 cm 1.73 cm/m   RA Area:     11.40 cm LA Vol (A2C):   78.5 ml 37.62 ml/m  RA Volume:   21.50 ml  10.30 ml/m LA Vol (A4C):   41.5 ml 19.89 ml/m LA Biplane Vol: 58.2 ml 27.89 ml/m  AORTIC VALVE LVOT Vmax:   127.00 cm/s LVOT Vmean:  85.400 cm/s LVOT VTI:    0.242  m  AORTA Ao Root diam: 3.00 cm MITRAL VALVE MV Area (PHT): 3.21 cm    SHUNTS MV Decel Time: 236 msec    Systemic VTI:  0.24 m MV E velocity: 66.00 cm/s  Systemic Diam: 2.10 cm MV A velocity: 81.20 cm/s MV E/A ratio:  0.81 Olga Millers MD  Electronically signed by Olga Millers MD Signature Date/Time: 12/10/2021/4:48:25 PM    Final     PHYSICAL EXAM  Physical Exam  Constitutional: Appears well-developed and well-nourished.   Cardiovascular: Normal rate and regular rhythm.  Respiratory: Effort normal, non-labored breathing  Neuro: Mental Status: Patient is awake, alert, oriented to situation. Patient is dysarthric. Naming, repetition, fluency, and comprehension intact.   Cranial Nerves: II: Visual Fields are full.  III,IV, VI: EOMI without ptosis or diploplia.  V: Decreased L facial sensation, reported as chronic  VII: R facial droop VIII: Hearing is intact to voice X: Palate elevates symmetrically XII: Tongue protrudes midline without atrophy or fasciculations.  Motor: Tone is normal. Bulk is normal. Limited ROM with RUE proximally. 4/5 strength RUE proximal, 4+/5 distally. 5/5 strength in LUE and LLE.  Sensory: Subjective decreased sensation to L face, arm, and leg. Reported as chronic.   ASSESSMENT/PLAN Ms. Caroleann Casler is a 46 y.o. female with history of multiple cryptogenic strokes with recent discontinuation of Eliquis for history of DVT who presented to the ED for evaluation of acute onset of dysarthria. Will repeat hypercoagulable w/u given concern for underlying unspecified hypercoagulable state. Noted that she has had prior hypercoagulable w/u that has been negative. No evidence of PFO or pAfib to explain etiology of her stroke. Suspect patient likely with underlying pro-thrombotic state. Secondary thrombosis prevention with long-term anticoagulation is indicated given her recurrent cryptogenic strokes with discontinuation of AC. In patients with antiphospholipid syndrome, trials support anticoagulation with warfarin given increased risk of recurrent thrombosis with DOAC. There is limited data for APS patients with arterial thrombosis. Options include standard warfarin, standard warfarin + ASA, or high intensity  warfarin. A systematic review of mainly retrospective studies observed fewer recurrent thrombotic events at INR >3 vs INR <3. Recommendation from the systematic review was for prolonged warfarin therapy INR 2-3 in APS patients with first venous events and INR >3 for patients with recurrent and/or arterial events. We will defer starting anticoagulation at this time pending shared decision making with patient.   Stroke:  small, acute cortical infarcts in R MCA, etiology unclear, but concerning for undetermined thrombophilia  MRI: Small acute cortical infarcts scattered in the right MCA distribution. Multiple remote infarcts  CTA head & neck: Normal CTA of the head and neck    2D Echo EF 60-65%. No evidence of interatrial shunt.  Loop recorder interrogation no A-fib LDL 61 HgbA1c 6.2 VTE prophylaxis - Lovenox  Repeat hypercoagulable w/u pending ASA 81 prior to admission, now on aspirin 325 mg daily. Pt will needs lifelong anticoagulation give hx of venous and arterial thrombosis. Will decide on [facility] region after discussing with pt Therapy recommendations:  Outpatient OT Disposition:  Pending  History of strokes Remote bilateral frontal and left cerebellar infarct 01/2020 admitted for right-sided weakness, aphasia and right facial/arm numbness.  CT showed subacute left frontal infarct.  CTA head and neck and CT perfusion negative.  MRI showed left frontal cortical and subcortical infarcts.  MRA head and neck negative.  EEG negative.  EF 55 to 60%.  TCD insignificant HITS.  LE venous Doppler no DVT.  TEE showed very small PFO.  CSF  analysis negative.  LDL 125, A1c 5.8.  Hypercoag workup only showed antiphospholipid IgM 14, slightly elevated.  Discussed with cardiology, no indication for PFO closure.  Patient discharged on DAPT and Lipitor 40. 09/2020 admitted in [place] for aphasia and right-sided weakness.  Status post tPA.  CTA head and neck left M1 occlusion.  Status post IR with TICI2b  reperfusion.  2D echo and TEE unremarkable.  However, LE venous Doppler showed positive right external iliac vein thrombosis.  Put on Eliquis.  Patient had residual right-sided weakness and speech difficulty. 08/2021 patient was off Eliquis for 1 months and then developed left-sided numbness and slurred speech.  MRI showed large right cerebellar infarct.  CTA neck showed right SCA occlusion.  TTE unremarkable.  UDS positive for cocaine.  Loop recorder placed. 11/14/2021 follow-up [facility] with Ihor Austin, repeat LE vein Doppler negative for DVT.  Patient was off Eliquis for the last 2 weeks. 12/04/2021 follow-up with Dr. Rubie Maid cardiology, loop recorder no A-fib and reviewed TEE in the past showed no PFO.  Hyperlipidemia Home meds:  lipitor 80mg  and zetia 10mg , resumed in hospital LDL 61, goal < 70 Continue statin at discharge  Tobacco abuse Current smoker Smoking cessation counseling provided Pt is willing to quit  Other Stroke Risk Factors Obesity, Body mass index is 35.65 kg/m., BMI >/= 30 associated with increased stroke risk, recommend weight loss, diet and exercise as appropriate  Prior Hx cocaine use  OSA  Other Active Problems Hypothyroidism (TSH 17.28) Levothyroxine increased to 175mg    Hospital day # 1  , MD, PGY-1  ATTENDING NOTE: I reviewed above note and agree with the assessment and plan. Pt was seen and examined.   46 year old female with history of hyperlipidemia, smoker, cocaine use, obesity, multiple strokes and DVT, OSA admitted for headache, slurred speech.  She was off Eliquis for the last 2 weeks.  MRI showed right MCA territory multiple small cortical infarcts.  CTA head and neck unremarkable.  EF 60 to 60%.  LDL 61, A1c 6.2.  UDS negative.  Repeat hypercoagulable workup pending.  TSH elevated.  Creatinine 0.80.  On exam, sister at bedside.  Patient awake alert, orientated x 3, visual fields full, no gaze palsy, left facial droop.  Right upper  extremity proximal 1-2/5, however distal 5/5.  Bilateral lower extremity 4/5.  Sensation decreased on the left.  Bilateral FTN grossly intact.  Etiology for patient's DVT and recurrent strokes involving both venous and arterial thrombosis (see above) concerning for thrombophilia.  Hypercoagulable workup in 2021 showed slightly elevated anticardiolipin IgM.  Repeat hypercoagulable workup pending.  However, lifelong anticoagulation is reasonable for further stroke prevention.  Currently she is on aspirin 325, will decide anticoagulant regimen after discussing with her in AM.  Continue Lipitor and Zetia.  Aggressive risk factor modification including smoking cessation, cocaine cessation, treat for OSA and lifestyle changes for obesity management.  Continue follow-up with Karie Fetch at [facility].  For detailed assessment and plan, please refer to above/below as I have made changes wherever appropriate.   2022, MD PhD Stroke Neurology 12/11/2021 5:59 PM    To contact Stroke Continuity provider, please refer to [facility]. After hours, contact General Neurology

## 2021-12-11 NOTE — Progress Notes (Signed)
Physical Therapy Evaluation Patient Details Name: Maria Porter MRN: [id number] DOB: [birth year] Today's Date: 12/11/2021  History of Present Illness  46 year old female with recurrent cryptogenic CVAs, history of DVTs, hypothyroidism, hyperlipidemia who presents 8/5 to the hospital for symptoms of slurred speech and right arm weakness.  Has R MCA infarcts and dysarthria.  Clinical Impression  Pt was seen for mobility and is asking about a power wheelchair, which was previously discussed with her outpatient therapy team. Pt is aware that she may not qualify, but did encourage her to ask about this with the planning team staff.  Will recommend her to outpatient therapy with home support as family describes, with around the clock care for the next several weeks.  Her goals for acute PT support the plan, and will encourage OOB to chair, increase standing time with walker and with balance skills, and recommend her to get extra help to walk and practice with devices to ensure her best options are being identified.  Follow for goals of acute PT as are outlined below.     Recommendations for follow up therapy are one component of a multi-disciplinary discharge planning process, led by the attending physician.  Recommendations may be updated based on patient status, additional functional criteria and insurance authorization.  Follow Up Recommendations Outpatient PT      Assistance Recommended at Discharge Intermittent Supervision/Assistance  Patient can return home with the following  A little help with walking and/or transfers;A little help with bathing/dressing/bathroom;Assistance with cooking/housework;Assist for transportation;Help with stairs or ramp for entrance    Equipment Recommendations Rolling walker (2 wheels)  Recommendations for Other Services       Functional Status Assessment Patient has had a recent decline in their functional status and demonstrates the ability to make significant  improvements in function in a reasonable and predictable amount of time.     Precautions / Restrictions Precautions Precautions: Fall Restrictions Weight Bearing Restrictions: No      Mobility  Bed Mobility Overal bed mobility: Needs Assistance Bed Mobility: Supine to Sit, Sit to Supine     Supine to sit: Min guard Sit to supine: Min assist   General bed mobility comments: min assist to return legs to bed    Transfers Overall transfer level: Needs assistance Equipment used: Rolling walker (2 wheels), 1 person hand held assist Transfers: Sit to/from Stand Sit to Stand: Min guard           General transfer comment: min guard for safety, tired from getting a bath with family    Ambulation/Gait Ambulation/Gait assistance: Min guard Gait Distance (Feet): 140 Feet (70 x 2) Assistive device: Rolling walker (2 wheels), 1 person hand held assist Gait Pattern/deviations: Step-through pattern, Decreased stride length, Decreased weight shift to right Gait velocity: reduced Gait velocity interpretation: <1.31 ft/sec, indicative of household ambulator Pre-gait activities: standing and dynamic standing balance General Gait Details: giving out on RLE with mild ER posture with no AD, with RW is controlled and able to WB with better control of hip and knee  Stairs            Wheelchair Mobility    Modified Rankin (Stroke Patients Only) Modified Rankin (Stroke Patients Only) Pre-Morbid Rankin Score: Slight disability Modified Rankin: Moderate disability     Balance Overall balance assessment: Needs assistance Sitting-balance support: Feet supported, Single extremity supported Sitting balance-Leahy Scale: Fair     Standing balance support: Bilateral upper extremity supported, During functional activity Standing balance-Leahy Scale: Poor  Pertinent Vitals/Pain Pain Assessment Pain Assessment: No/denies pain    Home  Living Family/patient expects to be discharged to:: Private residence Living Arrangements: Alone Available Help at Discharge: Family;Available 24 hours/day Type of Home: House Home Access: Stairs to enter Entrance Stairs-Rails: Left Entrance Stairs-Number of Steps: 3   Home Layout: One level Home Equipment: Agricultural consultant (2 wheels);BSC/3in1;Tub bench      Prior Function Prior Level of Function : Independent/Modified Independent             Mobility Comments: RW to go out only at baseline       Hand Dominance   Dominant Hand: Left (to feed herself and describes R hand function for some things)    Extremity/Trunk Assessment   Upper Extremity Assessment Upper Extremity Assessment: Defer to OT evaluation    Lower Extremity Assessment Lower Extremity Assessment: Generalized weakness;RLE deficits/detail RLE Deficits / Details: R side is 3+ to 4- RLE Coordination: decreased gross motor    Cervical / Trunk Assessment Cervical / Trunk Assessment: Normal  Communication   Communication: Expressive difficulties  Cognition Arousal/Alertness: Awake/alert Behavior During Therapy: WFL for tasks assessed/performed Overall Cognitive Status: Within Functional Limits for tasks assessed                                 General Comments: alert and oriented but family did comment on a couple things to be sure PT knew, thought pt may forget        General Comments General comments (skin integrity, edema, etc.): pt is more fatigued than during previous OT eval as she had just moved with family and had a bath.  Will anticipate help at home to be sufficient to assist along with outpatient therapy    Exercises Other Exercises Other Exercises: 3+ to4 - RLE   Assessment/Plan    PT Assessment Patient needs continued PT services  PT Problem List Decreased strength;Decreased activity tolerance;Decreased balance;Decreased coordination;Decreased knowledge of use of DME        PT Treatment Interventions DME instruction;Gait training;Stair training;Functional mobility training;Therapeutic activities;Therapeutic exercise;Balance training;Neuromuscular re-education;Patient/family education    PT Goals (Current goals can be found in the Care Plan section)  Acute Rehab PT Goals Patient Stated Goal: to get home with family and resume PT PT Goal Formulation: With patient/family Time For Goal Achievement: 12/25/21 Potential to Achieve Goals: Good    Frequency Min 4X/week     Co-evaluation               AM-PAC PT "6 Clicks" Mobility  Outcome Measure Help needed turning from your back to your side while in a flat bed without using bedrails?: None Help needed moving from lying on your back to sitting on the side of a flat bed without using bedrails?: A Little Help needed moving to and from a bed to a chair (including a wheelchair)?: A Little Help needed standing up from a chair using your arms (e.g., wheelchair or bedside chair)?: A Little Help needed to walk in hospital room?: A Little Help needed climbing 3-5 steps with a railing? : Total 6 Click Score: 17    End of Session Equipment Utilized During Treatment: Gait belt Activity Tolerance: Patient tolerated treatment well Patient left: in bed;with call bell/phone within reach;with bed alarm set Nurse Communication: Mobility status PT Visit Diagnosis: Unsteadiness on feet (R26.81);Muscle weakness (generalized) (M62.81);Difficulty in walking, not elsewhere classified (R26.2)    Time: 2671-2458 PT Time  Calculation (min) (ACUTE ONLY): 26 min   Charges:   PT Evaluation $PT Eval Moderate Complexity: 1 Mod PT Treatments $Gait Training: 8-22 mins       Ivar Drape 12/11/2021, 4:15 PM  Samul Dada, PT PhD Acute Rehab Dept. Number: [facility] [id number] and [facility] [phone]

## 2021-12-11 NOTE — Progress Notes (Signed)
  Transition of Care [facility]) Screening Note   Patient Details  Name: Hadeel Hillebrand Date of Birth: 1976-03-14   Transition of Care [facility]) CM/SW Contact:    Harriet Masson, RN Phone Number: 12/11/2021, 8:43 AM    Transition of Care Department [facility]) has reviewed patient and no TOC needs have been identified at this time. We will continue to monitor patient advancement through interdisciplinary progression rounds. If new patient transition needs arise, please place a TOC consult.

## 2021-12-11 NOTE — TOC Initial Note (Signed)
Transition of Care [facility]) - Initial/Assessment Note    Patient Details  Name: Maria Porter MRN: [id number] Date of Birth: [birth year]  Transition of Care [facility]) CM/SW Contact:    Harriet Masson, RN Phone Number: 12/11/2021, 4:01 PM  Clinical Narrative:                 Spoke to patient regarding therapy recommendations.  Patient currently doing therapy at [facility] neuro rehab on [address]. Resumption Referral sent for PT/OT/SLP. TOC will continue to follow for needs.  Expected Discharge Plan: OP Rehab Barriers to Discharge: Continued Medical Work up   Patient Goals and CMS Choice Patient states their goals for this hospitalization and ongoing recovery are:: return home      Expected Discharge Plan and Services Expected Discharge Plan: OP Rehab       Living arrangements for the past 2 months: Apartment                                      Prior Living Arrangements/Services Living arrangements for the past 2 months: Apartment                     Activities of Daily Living Home Assistive Devices/Equipment: None ADL Screening (condition at time of admission) Patient's cognitive ability adequate to safely complete daily activities?: Yes Is the patient deaf or have difficulty hearing?: No Does the patient have difficulty seeing, even when wearing glasses/contacts?: No Does the patient have difficulty concentrating, remembering, or making decisions?: No Patient able to express need for assistance with ADLs?: Yes Does the patient have difficulty dressing or bathing?: No Independently performs ADLs?: Yes (appropriate for developmental age) Does the patient have difficulty walking or climbing stairs?: Yes Weakness of Legs: Right Weakness of Arms/Hands: Right  Permission Sought/Granted                  Emotional Assessment              Admission diagnosis:  CVA (cerebral vascular accident) (HCC) [I63.9] Acute ischemic right MCA stroke  (HCC) [I63.511] Patient Active Problem List   Diagnosis Date Noted   CVA (cerebral vascular accident) (HCC) 12/10/2021   GERD (gastroesophageal reflux disease) 07/20/2021   Right shoulder pain 05/23/2021   Healthcare maintenance 03/07/2021   History of DVT (deep vein thrombosis) 10/06/2020   Family history of premature coronary heart disease 09/14/2020   OSA (obstructive sleep apnea) 09/14/2020   Pre-diabetes 02/03/2020   Subclinical hypothyroidism 02/03/2020   Tobacco use disorder 01/27/2020   Hyperlipemia 01/27/2020   Obesity 01/27/2020   History of CVA with residual deficit 01/26/2020   Extrinsic asthma 10/09/2004   PCP:  Belva Agee, MD Pharmacy:   [id number] - [place], [place] - [address] [place] [address] Phone: [phone] Fax: [phone]     Social Determinants of Health (SDOH) Interventions    Readmission Risk Interventions     No data to display

## 2021-12-12 ENCOUNTER — Ambulatory Visit: Payer: Medicaid Other | Admitting: Occupational Therapy

## 2021-12-12 ENCOUNTER — Ambulatory Visit: Payer: Medicaid Other | Admitting: Speech Pathology

## 2021-12-12 ENCOUNTER — Other Ambulatory Visit: Payer: Self-pay | Admitting: Student

## 2021-12-12 ENCOUNTER — Encounter: Payer: Self-pay | Admitting: Physical Therapy

## 2021-12-12 ENCOUNTER — Ambulatory Visit: Payer: Medicaid Other | Admitting: Physical Therapy

## 2021-12-12 DIAGNOSIS — Z86718 Personal history of other venous thrombosis and embolism: Secondary | ICD-10-CM | POA: Diagnosis not present

## 2021-12-12 DIAGNOSIS — I6930 Unspecified sequelae of cerebral infarction: Secondary | ICD-10-CM

## 2021-12-12 DIAGNOSIS — I63511 Cerebral infarction due to unspecified occlusion or stenosis of right middle cerebral artery: Secondary | ICD-10-CM | POA: Diagnosis not present

## 2021-12-12 DIAGNOSIS — E7800 Pure hypercholesterolemia, unspecified: Secondary | ICD-10-CM | POA: Diagnosis not present

## 2021-12-12 LAB — BETA-2-GLYCOPROTEIN I ABS, IGG/M/A
Beta-2 Glyco I IgG: <9 GPI IgG units (ref 0–20)
Beta-2-Glycoprotein I IgA: <9 GPI IgA units (ref 0–25)
Beta-2-Glycoprotein I IgM: <9 GPI IgM units (ref 0–32)

## 2021-12-12 LAB — HOMOCYSTEINE: Homocysteine: 11.7 umol/L (ref 0.0–14.5)

## 2021-12-12 MED ORDER — APIXABAN 5 MG PO TABS: 5.0000 mg | ORAL_TABLET | Freq: Two times a day (BID) | ORAL | 2 refills | Status: DC

## 2021-12-12 MED ORDER — LEVOTHYROXINE SODIUM 175 MCG PO TABS: 175.0000 ug | ORAL_TABLET | Freq: Every day | ORAL | 2 refills | Status: DC

## 2021-12-12 MED ORDER — APIXABAN 5 MG PO TABS
5.0000 mg | ORAL_TABLET | Freq: Two times a day (BID) | ORAL | Status: DC
Start: 2021-12-12 — End: 2021-12-12
  Administered 2021-12-12: 5 mg via ORAL
  Filled 2021-12-12: qty 1

## 2021-12-12 MED ORDER — ASPIRIN 81 MG PO TBEC: 81.0000 mg | DELAYED_RELEASE_TABLET | Freq: Every day | ORAL | Status: DC

## 2021-12-12 MED ORDER — ACETAMINOPHEN 325 MG PO TABS: 650.0000 mg | ORAL_TABLET | Freq: Four times a day (QID) | ORAL | 2 refills | Status: DC | PRN

## 2021-12-12 NOTE — Telephone Encounter (Signed)
The patient is now in our [facility] system but it does not look like she has a monitor. I will call her once she is released from the hospital to see if she has a monitor and/or order her a new one.  I left a voicemail on her phone. Shari Prows, [employer] rep states he will try to track her down at the hospital to see if she has a monitor or not.

## 2021-12-12 NOTE — Progress Notes (Signed)
Occupational Therapy Treatment Patient Details Name: Maria Porter MRN: [id number] DOB: 16-Jul-1975 Today's Date: 12/12/2021   History of present illness 46 year old female with recurrent cryptogenic CVAs, history of DVTs, hypothyroidism, hyperlipidemia who presents 8/5 to the hospital for symptoms of slurred speech and right arm weakness.  Has R MCA infarcts and dysarthria.   OT comments  Pt at this time reviewed RUE therapeutic exercises to decrease in frozen shoulder or subluxation while increase in AROM and FM activities. Pt voiced an understanding and completed RUE therapeutic exercises. Pt at this time did not have further questions about HEP. Pt currently with functional limitations due to the deficits listed below (see OT Problem List).  Pt will benefit from skilled OT to increase their safety and independence with ADL and functional mobility for ADL to facilitate discharge to venue listed below.     Recommendations for follow up therapy are one component of a multi-disciplinary discharge planning process, led by the attending physician.  Recommendations may be updated based on patient status, additional functional criteria and insurance authorization.    Follow Up Recommendations  Outpatient OT    Assistance Recommended at Discharge Set up Supervision/Assistance  Patient can return home with the following  Assist for transportation   Equipment Recommendations  None recommended by OT    Recommendations for Other Services      Precautions / Restrictions Precautions Precautions: Fall Restrictions Weight Bearing Restrictions: No       Mobility Bed Mobility Overal bed mobility:  (Pt presented at EOB)                  Transfers Overall transfer level: Needs assistance Equipment used: 1 person hand held assist Transfers: Sit to/from Stand Sit to Stand: Supervision                 Balance Overall balance assessment: Needs assistance Sitting-balance support:  No upper extremity supported Sitting balance-Leahy Scale: Good     Standing balance support: Bilateral upper extremity supported Standing balance-Leahy Scale: Fair                             ADL either performed or assessed with clinical judgement   ADL Overall ADL's : Needs assistance/impaired Eating/Feeding: Set up;Sitting   Grooming: Wash/dry hands;Supervision/safety;Standing   Upper Body Bathing: Set up;Sitting   Lower Body Bathing: Supervison/ safety;Sit to/from stand   Upper Body Dressing : Set up;Sitting   Lower Body Dressing: Supervision/safety;Sit to/from stand   Toilet Transfer: Supervision/safety;Cueing for safety;Cueing for sequencing                  Extremity/Trunk Assessment Upper Extremity Assessment Upper Extremity Assessment: RUE deficits/detail RUE Deficits / Details: limited active shoulder flexion, can bring her hand to her mouth RUE Sensation: WNL RUE Coordination: decreased fine motor;decreased gross motor   Lower Extremity Assessment Lower Extremity Assessment: Defer to PT evaluation        Vision       Perception Perception Perception: Within Functional Limits   Praxis Praxis Praxis: Intact    Cognition Arousal/Alertness: Awake/alert Behavior During Therapy: WFL for tasks assessed/performed Overall Cognitive Status: Within Functional Limits for tasks assessed                                          Exercises Exercises: General Upper Extremity, Hand activities  General Exercises - Upper Extremity Shoulder Flexion: AAROM, 5 reps, Seated Shoulder Extension: AAROM, 5 reps, Seated Shoulder ABduction: AAROM, 5 reps, Seated Shoulder ADduction: AAROM, 5 reps, Seated Shoulder Horizontal ABduction: AAROM, 5 reps, Seated Shoulder Horizontal ADduction: AAROM, 5 reps, Seated Elbow Flexion: 5 reps, Seated, AROM Elbow Extension: AROM, 5 reps, Seated    Shoulder Instructions       General Comments       Pertinent Vitals/ Pain       Pain Assessment Pain Assessment: No/denies pain  Home Living                                          Prior Functioning/Environment              Frequency  Min 2X/week        Progress Toward Goals  OT Goals(current goals can now be found in the care plan section)  Progress towards OT goals: Progressing toward goals  Acute Rehab OT Goals Patient Stated Goal: to go back to OP therapy OT Goal Formulation: With patient Time For Goal Achievement: 12/25/21 Potential to Achieve Goals: Good ADL Goals Pt Will Perform Lower Body Dressing: Independently;sit to/from stand Pt Will Transfer to Toilet: Independently;ambulating;regular height toilet Pt/caregiver will Perform Home Exercise Program: Increased strength;Increased ROM;Right Upper extremity;With minimal assist  Plan Discharge plan remains appropriate    Co-evaluation                 AM-PAC OT "6 Clicks" Daily Activity     Outcome Measure   Help from another person eating meals?: None Help from another person taking care of personal grooming?: None Help from another person toileting, which includes using toliet, bedpan, or urinal?: A Little Help from another person bathing (including washing, rinsing, drying)?: A Little Help from another person to put on and taking off regular upper body clothing?: None Help from another person to put on and taking off regular lower body clothing?: A Little 6 Click Score: 21    End of Session    OT Visit Diagnosis: Muscle weakness (generalized) (M62.81)   Activity Tolerance Patient tolerated treatment well   Patient Left in bed;with call bell/phone within reach;with bed alarm set   Nurse Communication          Time: 5697-9480 OT Time Calculation (min): 19 min  Charges: OT General Charges $OT Visit: 1 Visit OT Treatments $Therapeutic Exercise: 8-22 mins  Alphia Moh OTR/L  Acute Rehab Services  [phone]  office number [phone] pager number   Alphia Moh 12/12/2021, 11:45 AM

## 2021-12-12 NOTE — Discharge Summary (Signed)
Name: Maria Porter MRN: [id number] DOB: March 01, 1976 46 y.o. PCP: Belva Agee, MD  Date of Admission: 12/09/2021 11:19 PM Date of Discharge: 12/12/2021  Attending Physician: Dr. Criselda Peaches  Discharge Diagnosis: Principal Problem:   Acute ischemic right MCA stroke [facility]) Active Problems:   History of CVA with residual deficit   Hyperlipemia   Subclinical hypothyroidism   History of DVT (deep vein thrombosis)   Discharge Medications: Allergies as of 12/12/2021   No Known Allergies      Medication List     TAKE these medications    acetaminophen 325 MG tablet Commonly known as: TYLENOL Take 2 tablets (650 mg total) by mouth every 6 (six) hours as needed for mild pain (or Fever >/= 101).   albuterol (2.5 MG/3ML) 0.083% nebulizer solution Commonly known as: PROVENTIL Take 3 mLs (2.5 mg total) by nebulization every 4 (four) hours as needed for wheezing or shortness of breath. What changed: Another medication with the same name was changed. Make sure you understand how and when to take each.   Ventolin HFA 108 (90 Base) MCG/ACT inhaler Generic drug: albuterol INHALE 1-2 PUFFS BY MOUTH EVERY 6 HOURS AS NEEDED FOR WHEEZE OR SHORTNESS OF BREATH What changed: See the new instructions.   apixaban 5 MG Tabs tablet Commonly known as: ELIQUIS Take 1 tablet (5 mg total) by mouth 2 (two) times daily.   aspirin EC 81 MG tablet Take 1 tablet (81 mg total) by mouth daily. Swallow whole. What changed: additional instructions   atorvastatin 80 MG tablet Commonly known as: LIPITOR Take 1 tablet (80 mg total) by mouth daily.   cetirizine 10 MG tablet Commonly known as: ZYRTEC Take 10 mg by mouth daily.   ezetimibe 10 MG tablet Commonly known as: ZETIA Take 1 tablet (10 mg total) by mouth daily.   fluticasone 50 MCG/ACT nasal spray Commonly known as: FLONASE Place 1 spray into both nostrils daily.   levothyroxine 175 MCG tablet Commonly known as: SYNTHROID Take 1 tablet  (175 mcg total) by mouth daily at 6 (six) AM. Start taking on: December 13, 2021 What changed:  medication strength how much to take when to take this   ondansetron 4 MG tablet Commonly known as: Zofran Take 1 tablet (4 mg total) by mouth every 8 (eight) hours as needed for nausea or vomiting.   pantoprazole 40 MG tablet Commonly known as: PROTONIX TAKE 1 TABLET BY MOUTH EVERY DAY   polyethylene glycol 17 g packet Commonly known as: MIRALAX / GLYCOLAX Take 17 g by mouth daily as needed for mild constipation or moderate constipation.        Disposition and follow-up:   Maria Porter was discharged from [facility] in Stable condition.  At the hospital follow up visit please address:  1.  Follow-up:  a. CVA - follow up deficits, home needs, and adherence to eliquis and aspirin    b. Tobacco use disorder - follow up smoking cessation   c. Hypothyroidism - adjusted levothyroxine  2.  Labs / imaging needed at time of follow-up: Follow up TSH in 4 weeks  3.  Pending labs/ test needing follow-up: protein C, S, beta-2-glycoprotein, homocysteine, factor 5 leiden, prothrombin, antiphospholipid antibodies  Follow-up Appointments:  Follow-up Information     [facility] Follow up.   Specialty: Rehabilitation Why: Call to schedule apt for PT/OT/SLP Contact information: [address] [id number] mc [place] [address] [phone]        [place],  Shanda Bumps, NP. Schedule an appointment as soon as possible for a visit in 1 month(s).   Specialty: Neurology Why: stroke clinic Contact information: [address] [place] [address] [phone]                 Hospital Course by problem list: At time of hospital follow-up visit, please address the following: - Neurology follow up for recurrent cryptogenic strokes - Hypercoagulability workup - Anticoagulation tolerance - TSH recheck  in 4-6 weeks - PT/OT, access to mobility aids, and other home resources  ---------------------------------  Maria Porter is a 46 y.o. female with PMHx of recurrent cryptogenic CVA, prior R external iliac DVT, hyperlipidemia, prediabetes, tobacco use, and cocaine use who was admitted to [facility] on 12/10/2021 for workup and management of acute cortical infarcts in the R MCA distribution.   Problem-based hospital course:  Acute infarcts in the R MCA distribution Progression of remote infarcts H/o recurrent cryptogenic CVAs Patient presented on 8/6 with new onset dysarthria. She also has chronic neuro deficits from her prior infarcts including right facial weakness, RUE hemiparesis, and left-sided sensory deficits of the body. She was otherwise hemodynamically and neurologically stable this admission. MRI brain on 8/6 showed small acute cortical infarcts in the right MCA distribution with multiple remote infarct progression since 2021. Labs were unremarkable. Bubble echo on 8/6 showed no shunting. ILR was checked and showed no episodes of afib. Given that her eliquis was recently stopped and she has previously had CVAs after stopping anticoagulation, there remains concern that the underlying etiology is embolic although she has undergone extensive workup without revealing a source. It is unclear if her TEE in 2021 truly showed a small PFO. Neurology was consulted and ordered recheck of her hypercoagulability workup. Patient will likely need lifelong anticoagulation. She was discharged on aspirin and eliquis and will have close follow-up with neurology as outpatient.   Hypothyroidism  TSH 17 on 8/6. Her levothyroxine was increased to 175 mcg daily. TSH should be rechecked in 4-6 weeks.  Hyperlipidemia Home atorvastatin and zetia were continued.  ---------------------------------  Subjective NAEON, VSS. Patient reports feeling generally well today. Her speech has returned to  how it was before this hospitalization.She denies any dizziness, lightheadedness, headaches, chest pain, abdominal pain, N/V, or shortness of breath.    Objective BP 127/83   Pulse 81   Temp 98.5 F (36.9 C)   Resp 16   Ht 5\' 6"  (1.676 m)   Wt 98.2 kg   SpO2 100%   BMI 34.94 kg/m   Constitutional: Alert and oriented. Well appearing. No acute distress. Converses and answers questions appropriately. Eyes: Sclera anicteric. EOMI grossly intact. HENT: Normocephalic and atraumatic. MMM. Neck: No cervical lymphadenopathy or thyromegaly. No JVD. Cardiovascular: RRR. No M/R/G. Respiratory: CTAB. No crackles or wheezes. No use of accessory muscles. Gastrointestinal: Soft. NT/ND. Normoactive bowel sounds. No rebound or guarding. Musculoskeletal: No clubbing, cyanosis, or edema. +2 pulses in bilateral upper and lower extremities. Skin: Skin is warm, dry, and intact. No rashes or lesions noted on clothed exam. Psychiatric: Normal mood and affect. Normal speech and behavior. Neuro:  - Mental status: Alert and oriented x4.  - Speech and language: Dysarthria, chronic. No aphasia.  - CN II: PERRL.  - CN III, IV, VI: EOMI. No nystagmus.  - CN V: Decreased sensation of the L face, chronic. - CN VII: R lower facial weakness, chronic.  - CN VIII: Hearing intact to voice. - CN IX, X: Symmetric palate  elevation. Normal phonation.  - CN XI: 4/5 strength of the R side, chronic. 5/5 strength of the L. - CN XII: Tongue protrusion at midline, no fasciculations.  - Motor: 4/5 strength of the RUE, limited ROM, chronic. 5/5 Strength of the LUE, no vertical drift. RLE and LLE w/o vertical drift.  - Sensation: Decreased sensation of the L face, arm, and leg, chronic.  - Coordination: No overt ataxia noted.  - DTRs: Deferred - Gait: Deferred     Pertinent Labs, Studies, and Procedures:     Latest Ref Rng & Units 12/11/2021    8:46 AM 12/10/2021   12:00 AM 12/09/2021   11:52 PM  CBC  WBC 4.0 - 10.5 K/uL  7.1   6.5   Hemoglobin 12.0 - 15.0 g/dL 00.3  70.4  88.8   Hematocrit 36.0 - 46.0 % 40.8  41.0  41.6   Platelets 150 - 400 K/uL 360   344        Latest Ref Rng & Units 12/11/2021    8:46 AM 12/10/2021   12:00 AM 12/09/2021   11:52 PM  CMP  Glucose 70 - 99 mg/dL 916  945  038   BUN 6 - 20 mg/dL 11  8  8    Creatinine 0.44 - 1.00 mg/dL  8.82  8.00   Sodium 135 - 145 mmol/L 137  140  138   Potassium 3.5 - 5.1 mmol/L 3.9  3.8  3.8   Chloride 98 - 111 mmol/L 106  105  107   CO2 22 - 32 mmol/L 22   23   Calcium 8.9 - 10.3 mg/dL 9.5   9.6   Total Protein 6.5 - 8.1 g/dL   6.9   Total Bilirubin 0.3 - 1.2 mg/dL   0.5   Alkaline Phos 38 - 126 U/L   93   AST 15 - 41 U/L   26   ALT 0 - 44 U/L   34     ECHOCARDIOGRAM COMPLETE BUBBLE STUDY  Result Date: 12/10/2021    ECHOCARDIOGRAM REPORT   Patient Name:   ELINORE SHULTS Date of Exam: 12/10/2021 Medical Rec #:  02/09/2022       Height:       66.0 in Accession #:    [id number]      Weight:       221.0 lb Date of Birth:  1975/10/02       BSA:          2.086 m Patient Age:    46 years        BP:           138/90 mmHg Patient Gender: F               HR:           84 bpm. Exam Location:  Inpatient Procedure: 2D Echo, Cardiac Doppler, Color Doppler and Saline Contrast Bubble            Study Indications:    CVA  History:        Patient has prior history of Echocardiogram examinations. TEE                 with bubble 01/29/20. TCD with bubble 01/28/20. Multiple strokes.  Sonographer:    01/30/20 RDCS Referring Phys: Roosvelt Maser [id number] [facility] IMPRESSIONS  1. Left ventricular ejection fraction, by estimation, is 60 to 65%. The left ventricle has normal function. The left ventricle has no regional  wall motion abnormalities. There is mild left ventricular hypertrophy of the basal-septal segment. Left ventricular diastolic parameters were normal.  2. Right ventricular systolic function is normal. The right ventricular size is normal.  3. The mitral valve is normal in  structure. No evidence of mitral valve regurgitation. No evidence of mitral stenosis.  4. The aortic valve is tricuspid. Aortic valve regurgitation is not visualized. No aortic stenosis is present.  5. Agitated saline contrast bubble study was negative, with no evidence of any interatrial shunt. FINDINGS  Left Ventricle: Left ventricular ejection fraction, by estimation, is 60 to 65%. The left ventricle has normal function. The left ventricle has no regional wall motion abnormalities. The left ventricular internal cavity size was normal in size. There is  mild left ventricular hypertrophy of the basal-septal segment. Left ventricular diastolic parameters were normal. Right Ventricle: The right ventricular size is normal. Right ventricular systolic function is normal. Left Atrium: Left atrial size was normal in size. Right Atrium: Right atrial size was normal in size. Pericardium: Trivial pericardial effusion is present. Mitral Valve: The mitral valve is normal in structure. Mild mitral annular calcification. No evidence of mitral valve regurgitation. No evidence of mitral valve stenosis. Tricuspid Valve: The tricuspid valve is normal in structure. Tricuspid valve regurgitation is trivial. No evidence of tricuspid stenosis. Aortic Valve: The aortic valve is tricuspid. Aortic valve regurgitation is not visualized. No aortic stenosis is present. Pulmonic Valve: The pulmonic valve was normal in structure. Pulmonic valve regurgitation is not visualized. No evidence of pulmonic stenosis. Aorta: The aortic root is normal in size and structure. Venous: The inferior vena cava was not well visualized. IAS/Shunts: No atrial level shunt detected by color flow Doppler. Agitated saline contrast was given intravenously to evaluate for intracardiac shunting. Agitated saline contrast bubble study was negative, with no evidence of any interatrial shunt.  LEFT VENTRICLE PLAX 2D LVIDd:         3.70 cm   Diastology LVIDs:         2.50  cm   LV e' medial:    8.59 cm/s LV PW:         1.10 cm   LV E/e' medial:  7.7 LV IVS:        1.30 cm   LV e' lateral:   10.60 cm/s LVOT diam:     2.10 cm   LV E/e' lateral: 6.2 LV SV:         84 LV SV Index:   40 LVOT Area:     3.46 cm  RIGHT VENTRICLE RV Basal diam:  2.40 cm RV S prime:     14.70 cm/s TAPSE (M-mode): 2.4 cm LEFT ATRIUM             Index        RIGHT ATRIUM           Index LA diam:        3.60 cm 1.73 cm/m   RA Area:     11.40 cm LA Vol (A2C):   78.5 ml 37.62 ml/m  RA Volume:   21.50 ml  10.30 ml/m LA Vol (A4C):   41.5 ml 19.89 ml/m LA Biplane Vol: 58.2 ml 27.89 ml/m  AORTIC VALVE LVOT Vmax:   127.00 cm/s LVOT Vmean:  85.400 cm/s LVOT VTI:    0.242 m  AORTA Ao Root diam: 3.00 cm MITRAL VALVE MV Area (PHT): 3.21 cm    SHUNTS MV Decel Time: 236 msec    Systemic  VTI:  0.24 m MV E velocity: 66.00 cm/s  Systemic Diam: 2.10 cm MV A velocity: 81.20 cm/s MV E/A ratio:  0.81 Olga Millers MD Electronically signed by Olga Millers MD Signature Date/Time: 12/10/2021/4:48:25 PM    Final    MR BRAIN WO CONTRAST  Result Date: 12/10/2021 CLINICAL DATA:  Headache, new or worsening EXAM: MRI HEAD WITHOUT CONTRAST TECHNIQUE: Multiplanar, multiecho pulse sequences of the brain and surrounding structures were obtained without intravenous contrast. COMPARISON:  CTA from earlier today.  MRI 01/26/2020 FINDINGS: Brain: Small scattered acute infarcts along the right insular and frontal parietal cortex. Remote infarcts in the inferior medial left cerebellum, right superior cerebellum, anterior left frontal cortex and left basal ganglia. The basal ganglia and right superior cerebellar infarcts are new. No hemorrhage, hydrocephalus, or collection. Vascular: Major flow voids are preserved Skull and upper cervical spine: Normal marrow signal Sinuses/Orbits: Negative IMPRESSION: 1. Small acute cortical infarcts scattered in the right MCA distribution. 2. Multiple remote infarcts with progression from 2021, especially  in the superior right cerebellum. Electronically Signed   By: Tiburcio Pea M.D.   On: 12/10/2021 06:01   CT ANGIO HEAD NECK W WO CM  Result Date: 12/10/2021 CLINICAL DATA:  Acute neurologic deficit EXAM: CT ANGIOGRAPHY HEAD AND NECK TECHNIQUE: Multidetector CT imaging of the head and neck was performed using the standard protocol during bolus administration of intravenous contrast. Multiplanar CT image reconstructions and MIPs were obtained to evaluate the vascular anatomy. Carotid stenosis measurements (when applicable) are obtained utilizing NASCET criteria, using the distal internal carotid diameter as the denominator. RADIATION DOSE REDUCTION: This exam was performed according to the departmental dose-optimization program which includes automated exposure control, adjustment of the mA and/or kV according to patient size and/or use of iterative reconstruction technique. CONTRAST:  58mL OMNIPAQUE IOHEXOL 350 MG/ML SOLN COMPARISON:  None Available. FINDINGS: CT HEAD FINDINGS Brain: There is no mass, hemorrhage or extra-axial collection. The size and configuration of the ventricles and extra-axial CSF spaces are normal. There is no acute or chronic infarction. The brain parenchyma is normal. Skull: The visualized skull base, calvarium and extracranial soft tissues are normal. Sinuses/Orbits: No fluid levels or advanced mucosal thickening of the visualized paranasal sinuses. No mastoid or middle ear effusion. The orbits are normal. CTA NECK FINDINGS SKELETON: There is no bony spinal canal stenosis. No lytic or blastic lesion. OTHER NECK: Normal pharynx, larynx and major salivary glands. No cervical lymphadenopathy. Unremarkable thyroid gland. UPPER CHEST: No pneumothorax or pleural effusion. No nodules or masses. AORTIC ARCH: There is no calcific atherosclerosis of the aortic arch. There is no aneurysm, dissection or hemodynamically significant stenosis of the visualized portion of the aorta. Conventional 3  vessel aortic branching pattern. The visualized proximal subclavian arteries are widely patent. RIGHT CAROTID SYSTEM: Normal without aneurysm, dissection or stenosis. LEFT CAROTID SYSTEM: Normal without aneurysm, dissection or stenosis. VERTEBRAL ARTERIES: Left dominant configuration. Both origins are clearly patent. There is no dissection, occlusion or flow-limiting stenosis to the skull base (V1-V3 segments). CTA HEAD FINDINGS POSTERIOR CIRCULATION: --Vertebral arteries: Normal V4 segments. --Inferior cerebellar arteries: Normal. --Basilar artery: Normal. --Superior cerebellar arteries: Normal. --Posterior cerebral arteries (PCA): Normal. ANTERIOR CIRCULATION: --Intracranial internal carotid arteries: Normal. --Anterior cerebral arteries (ACA): Normal. Both A1 segments are present. Patent anterior communicating artery (a-comm). --Middle cerebral arteries (MCA): Normal. VENOUS SINUSES: As permitted by contrast timing, patent. ANATOMIC VARIANTS: None Review of the MIP images confirms the above findings. IMPRESSION: Normal CTA of the head and neck. Electronically Signed  By: Deatra RobinsonKevin  Herman M.D.   On: 12/10/2021 00:45     Discharge Instructions: Discharge Instructions     Ambulatory referral to Neurology   Complete by: As directed    [facility]. Thanks.   Ambulatory referral to Occupational Therapy   Complete by: As directed    Ambulatory referral to Physical Therapy   Complete by: As directed    Ambulatory referral to Speech Therapy   Complete by: As directed    Call MD for:  difficulty breathing, headache or visual disturbances   Complete by: As directed    Call MD for:  extreme fatigue   Complete by: As directed    Call MD for:  persistant dizziness or light-headedness   Complete by: As directed    Call MD for:  persistant nausea and vomiting   Complete by: As directed    Call MD for:  redness, tenderness, or signs of infection (pain, swelling, redness,  odor or green/yellow discharge around incision site)   Complete by: As directed    Call MD for:  severe uncontrolled pain   Complete by: As directed    Call MD for:  temperature >100.4   Complete by: As directed    Diet - low sodium heart healthy   Complete by: As directed    Increase activity slowly   Complete by: As directed        Signed: Belva AgeeKatsadouros, Zynia Wojtowicz, MD 12/12/2021, 6:59 PM   Pager: [phone]

## 2021-12-12 NOTE — Therapy (Signed)
[facility] [address] [place], [place], [address] Phone: [phone]   Fax:  [phone]  Patient Details  Name: Maria Porter MRN: [id number] Date of Birth: 01-29-1976 Referring Provider:  No ref. provider found  Encounter Date: 12/12/2021  PHYSICAL THERAPY DISCHARGE SUMMARY  Visits from Start of Care: 2  Current functional level related to goals / functional outcomes: Unable to assess due to new CVA since last PT visit and pt admitted into hospital.   Remaining deficits: Unable to assess due to new CVA   Education / Equipment: HEP    Patient agrees to discharge. Patient goals were not met. Patient is being discharged due to a change in medical status.    Cruzita Lederer Kiante Petrovich, PT, DPT 12/12/2021, 3:00 PM  [facility] [address] [place], [place], [address] Phone: [phone]   Fax:  [phone]

## 2021-12-12 NOTE — Telephone Encounter (Signed)
The patient states she do have a monitor. When she get home she is going to call me with the serial number.

## 2021-12-12 NOTE — Telephone Encounter (Signed)
Pt returned call. Transferred to [facility].

## 2021-12-12 NOTE — Discharge Instructions (Addendum)
Maria Porter,   You were hospitalized at [facility] for a new stroke, which you experienced as changes in your speech. Your speech changes improved while you were in the hospital. You were seen by the neurology team and will continue to follow up with them. You were also started on blood thinners which you will stay on for the foreseeable future. **  We also recommend the following:  - See physical therapy and occupational therapy  If you experience any of the following symptoms - new changes in speech, new weakness or loss of sensation, chest pain, shortness of breath - please call your primary care doctor to determine if you need to be seen. If your symptoms are very severe or you feel very sick, please visit to the nearest emergency department to be evaluated.  You may call our clinic if you have any questions or concerns; we may be able to help and keep you from a long and expensive emergency department wait. Our clinic and after hours phone number is [phone]. The best time to call is Monday through Friday, 9 AM to 4 PM, but there is always someone available 24/7 if you have an emergency.   If you need medication refills, please notify your pharmacy one week in advance and they will send [place] a request.   Thank you for allowing [place] to be a part of your care. Do not hesitate to reach out - we are here to help.  Sincerely, [facility]     Information on my medicine - ELIQUIS (apixaban)  Why was Eliquis prescribed for you? Eliquis was prescribed for you to reduce the risk of forming blood clots that can cause a stroke if you have a medical condition called atrial fibrillation (a type of irregular heartbeat) OR to reduce the risk of a blood clots forming after orthopedic surgery.  What do You need to know about Eliquis ? Take your Eliquis TWICE DAILY - one tablet in the morning and one tablet in the evening with or without  food.  It would be best to take the doses about the same time each day.  If you have difficulty swallowing the tablet whole please discuss with your pharmacist how to take the medication safely.  Take Eliquis exactly as prescribed by your doctor and DO NOT stop taking Eliquis without talking to the doctor who prescribed the medication.  Stopping may increase your risk of developing a new clot or stroke.  Refill your prescription before you run out.  After discharge, you should have regular check-up appointments with your healthcare provider that is prescribing your Eliquis.  In the future your dose may need to be changed if your kidney function or weight changes by a significant amount or as you get older.  What do you do if you miss a dose? If you miss a dose, take it as soon as you remember on the same day and resume taking twice daily.  Do not take more than one dose of ELIQUIS at the same time.  Important Safety Information A possible side effect of Eliquis is bleeding. You should call your healthcare provider right away if you experience any of the following: Bleeding from an injury or your nose that does not stop. Unusual colored urine (red or dark brown) or unusual colored stools (red or black). Unusual bruising for unknown reasons. A serious fall or if you hit your head (even if there is no bleeding).  Some medicines may interact with Eliquis and might increase your risk of bleeding or clotting while on Eliquis. To help avoid this, consult your healthcare provider or pharmacist prior to using any new prescription or non-prescription medications, including herbals, vitamins, non-steroidal anti-inflammatory drugs (NSAIDs) and supplements.  This website has more information on Eliquis (apixaban): [email or web address].com.cy.

## 2021-12-12 NOTE — Progress Notes (Signed)
Pt alert and oriented x 4. NIH 2. NO changes in mental or neuro status during overnight. Voided x 1. Ambulated with walker and one assist to BR. Vitals stable.

## 2021-12-12 NOTE — Progress Notes (Addendum)
STROKE TEAM PROGRESS NOTE   INTERVAL HISTORY She is seen laying in bed in her room. No acute events overnight. Long discussion with patient re long term anticoagulation. Discussed options including warfarin, warfarin + aspirin, eliquis + aspirin. Discussed her preference, access to get to clinic appointments, and her prior response with eliquis. Patient notes that she often has to travel to see her kids so eliquis would be more convenient for her. Patient also notes no hx of CVA when she was on eliquis previously. Discussed that we could continue with eliquis and add low dose ASA. Discussed possible need to transition to warfarin if any of the hypercoagulable work-up comes back positive.   MRI with multiple acute small cortical infarcts in R MCA distribution.   Vitals:   12/11/21 2031 12/12/21 0001 12/12/21 0500 12/12/21 0747  BP: (!) 128/90 127/83  127/76  Pulse: 86 81  100  Resp: 16   20  Temp: 98.3 F (36.8 C) 98.5 F (36.9 C)  98.2 F (36.8 C)  TempSrc:    Oral  SpO2: 100% 100%  100%  Weight:   98.2 kg   Height:       CBC:  Recent Labs  Lab 12/09/21 2352 12/10/21 0000 12/11/21 0846  WBC 6.5  --  7.1  NEUTROABS 3.6  --   --   HGB 13.6 13.9 13.5  HCT 41.6 41.0 40.8  MCV 88.9  --  87.4  PLT 344  --  360   Basic Metabolic Panel:  Recent Labs  Lab 12/09/21 2352 12/10/21 0000 12/11/21 0846  NA 138 140 137  K 3.8 3.8 3.9  CL 107 105 106  CO2 23  --  22  GLUCOSE 133* 129* 121*  BUN 8 8 11   CREATININE 0.82 0.80 0.88  CALCIUM 9.6  --  9.5   Lipid Panel:  Recent Labs  Lab 12/10/21 0954  CHOL 125  TRIG 68  HDL 50  CHOLHDL 2.5  VLDL 14  LDLCALC 61   HgbA1c:  Recent Labs  Lab 12/10/21 0954  HGBA1C 6.2*   Urine Drug Screen:  Recent Labs  Lab 12/10/21 0214  LABOPIA NONE DETECTED  COCAINSCRNUR NONE DETECTED  LABBENZ NONE DETECTED  AMPHETMU NONE DETECTED  THCU NONE DETECTED  LABBARB NONE DETECTED    Alcohol Level  Recent Labs  Lab 12/09/21 2352  ETH  <10    IMAGING past 24 hours No results found.  PHYSICAL EXAM  Physical Exam  Constitutional: Appears well-nourished. Poor dentition.   Cardiovascular: Normal rate and regular rhythm.  Respiratory: Effort normal, non-labored breathing  Neuro: Mental Status: Patient is awake, alert, oriented to situation. Patient is dysarthric. Naming, repetition, fluency, and comprehension intact.   Cranial Nerves: II: Visual Fields are full.  III,IV, VI: EOMI without ptosis or diploplia.  V: Decreased L facial sensation, reported as chronic  VII: L facial droop VIII: Hearing is intact to voice X: Palate elevates symmetrically XII: Tongue protrudes midline without atrophy or fasciculations.  Motor: Limited ROM with RUE proximally. 1-2/5 strength RUE proximal, 5/5 distally. 5/5 strength in LUE and LLE.  Sensory: Subjective decreased sensation to L face, arm, and leg. Reported as chronic.  Cerebellar:  Bilateral FTN grossly intact.   ASSESSMENT/PLAN Maria Porter is a 46 y.o. female with history of multiple cryptogenic strokes with recent discontinuation of Eliquis for history of DVT who presented to the ED for evaluation of acute onset of dysarthria. Will repeat hypercoagulable w/u given concern for underlying unspecified  hypercoagulable state. Noted that she has had prior hypercoagulable w/u that has been largely unremarkable. No evidence of PFO or pAfib to explain etiology of her stroke. Suspect patient likely with underlying unspecified thrombophilia. Secondary thrombosis prevention with long-term anticoagulation is indicated given her recurrent cryptogenic strokes with discontinuation of AC. Shared decision making with patient to start eliquis and low-dose aspirin indefinitely.   Stroke:  small, acute cortical infarcts in R MCA, etiology unclear, but concerning for undetermined thrombophilia  MRI: Small acute cortical infarcts scattered in the right MCA distribution. Multiple remote  infarcts  CTA head & neck: Normal CTA of the head and neck    2D Echo EF 60-65%. No evidence of interatrial shunt.  Loop recorder interrogation no A-fib LDL 61 HgbA1c 6.2 VTE prophylaxis - Lovenox  Repeat hypercoagulable w/u pending  Negative: ATIII Pending: protein C, S, beta-2-glycoprotein, homocysteine, factor 5 leiden, prothrombin, antiphospholipid antibodies ASA 81 prior to admission, now on aspirin 81 mg daily and Eliquis (apixaban) daily. Pt will needs lifelong anticoagulation give hx of venous and arterial thrombosis.  Therapy recommendations:  Outpatient PT/OT Disposition:  Home Follow[facility]up with Frann R[facility] at [facility].  History of strokes Remote bilateral frontal and left cerebellar infarct 01/2020 admitted for right-sided weakness, aphasia and right facial/arm numbness.  CT showed subacute left frontal infarct.  CTA head and neck and CT perfusion negative.  MRI showed left frontal cortical and subcortical infarcts.  MRA head and neck negative.  EEG negative.  EF 55 to 60%.  TCD insignificant HITS.  LE venous Doppler no DVT.  TEE showed very small PFO.  CSF analysis negative.  LDL 125, A1c 5.8.  Hypercoag workup only showed antiphospholipid IgM 14, slightly elevated.  Discussed with cardiology, no indication for PFO closure.  Patient discharged on DAPT and Lipitor 40. 09/2020 admitted in [place] for aphasia and right-sided weakness.  Status post tPA.  CTA head and neck left M1 occlusion.  Status post IR with TICI2b reperfusion.  2D echo and TEE unremarkable.  However, LE venous Doppler showed positive right external iliac vein thrombosis.  Put on Eliquis.  Patient had residual right-sided weakness and speech difficulty. 08/2021 patient was off Eliquis for 1 months and then developed left-sided numbness and slurred speech.  MRI showed large right cerebellar infarct.  CTA neck showed right SCA occlusion.  TTE unremarkable.  UDS positive for cocaine.  Loop recorder placed. 11/14/2021  follow-up [facility] with Frann Rider, repeat LE vein Doppler negative for DVT.  Patient was off Eliquis for the last 2 weeks. 12/04/2021 follow-up with Dr. Recardo Evangelist cardiology, loop recorder no A-fib and reviewed TEE in the past showed no PFO.  Hyperlipidemia Home meds:  lipitor 80mg  and zetia 10mg , resumed in hospital LDL 61, goal < 70 Continue statin at discharge  Tobacco abuse Current smoker Smoking cessation counseling provided On nicotine patch Pt is willing to quit  Other Stroke Risk Factors Obesity, Body mass index is 34.94 kg/m., BMI >/= 30 associated with increased stroke risk, recommend weight loss, diet and exercise as appropriate  Prior Hx cocaine use  OSA  Other Active Problems Hypothyroidism (TSH 17.28) Levothyroxine increased to 175mg    Hospital day # 2  Rolanda Lundborg, MD, PGY-1  12/12/2021 11:58 AM  ATTENDING NOTE: I reviewed above note and agree with the assessment and plan. Pt was seen and examined.   No family at the bedside. Pt lying in bed, no complains. Neuro stable, no change. Discussed with pt about antithrombotic regimen. He has frequent travel  and needed Medicaid transport for appointment and not feel comfortable with coumadin. She has been on eliquis before and tolerating well. We decided to go with eliquis 5mg  bid along with ASA 81 for her stroke prevention given her hx of DVT and recurrent strokes while off eliquis. PT/OT recommend outpt therapy.   For detailed assessment and plan, please refer to above/below as I have made changes wherever appropriate.   Neurology will sign off. Please call with questions. Pt will follow up with stroke clinic NP at [facility] in about 4 weeks. Thanks for the consult.  [facility], MD PhD Stroke Neurology 12/12/2021 2:41 PM     To contact Stroke Continuity provider, please refer to 02/11/2022. After hours, contact General Neurology

## 2021-12-13 ENCOUNTER — Encounter: Payer: Self-pay | Admitting: Speech Pathology

## 2021-12-13 DIAGNOSIS — I6930 Unspecified sequelae of cerebral infarction: Secondary | ICD-10-CM | POA: Diagnosis not present

## 2021-12-13 LAB — ANTIPHOSPHOLIPID SYNDROME EVAL, BLD
Anticardiolipin IgA: <9 APL U/mL (ref 0–11)
Anticardiolipin IgG: <9 GPL U/mL (ref 0–14)
Anticardiolipin IgM: <9 MPL U/mL (ref 0–12)
DRVVT: 31 s (ref 0.0–47.0)
PTT Lupus Anticoagulant: 32 s (ref 0.0–43.5)
Phosphatydalserine, IgA: <1 APS Units (ref 0–19)
Phosphatydalserine, IgG: 9 Units (ref 0–30)
Phosphatydalserine, IgM: 16 Units (ref 0–30)

## 2021-12-13 LAB — PROTEIN C ACTIVITY: Protein C Activity: 124 % (ref 73–180)

## 2021-12-13 LAB — PROTEIN S, TOTAL: Protein S Ag, Total: 86 % (ref 60–150)

## 2021-12-13 LAB — PROTEIN S ACTIVITY: Protein S Activity: 79 % (ref 63–140)

## 2021-12-13 NOTE — Therapy (Signed)
[facility] [address] [place], [place], [address] Phone: [phone]   Fax:  [phone]  Patient Details  Name: Maria Porter MRN: [id number] Date of Birth: 1975/09/02 Referring Provider:  No ref. provider found  Encounter Date: 12/13/2021   SPEECH THERAPY DISCHARGE SUMMARY  Visits from Start of Care: 2  Current functional level related to goals / functional outcomes: Unable to assess d/t new stroke since last ST visit, pt admitted to hospital 12/09/2021. Limited progress made towards goals d/t only 1 therapy session following evaluation.    Remaining deficits: Expressive aphasia, dysarthria, foreign accent disorder   Education / Equipment: Self cueing, anomia strategies, HEP   Patient agrees to discharge. Patient goals were not met. Patient is being discharged due to a change in medical status.  GOALS: Goals reviewed with patient? Yes   SHORT TERM GOALS: Target date: 12/21/2021   Pt will successfully utilize trained anomia compensations in structured language tasks, given occasional min-A in 75% of opportunities over 2 sessions.  Baseline: not using any compensations, avoiding communication Goal status: NOT MET   2.  Pt will name 10 items in x3 personally relevant categories over 2 sessions with occasional min-A.  Baseline: x5 items during divergent naming task Goal status: NOT MET   3.  Pt will identify x2 opportunities to speak to people outside of typically talked to family members, and have brief conversational exchange, self-reporting a score of 2/5 for communication effectiveness.  Baseline: 0/5 self-rating for efficacy when talking to people outside of immediate family members and best friend  Goal status: NOT MET     LONG TERM GOALS: Target date: 01/18/2022   Pt will successfully utilize trained anomia compensations in 15 minute moderately complex conversational exchange in 75% of opportunities, given  rare min-A.  Baseline: not using anomia compensations Goal status: NOT MET   2.  Pt will improve her score on the Communication Participation Item Bank short form by 2 points  Baseline: initial score of 0 Goal status: NOT MET   3.  Pt will report carryover of anomia compensations, resulting in subjective feeling if improved communication effectiveness while out in community, over 1 week period.  Baseline: avoiding conversations, ID talking while out in community as greatest challenge Goal status: NOT MET   Su Monks, CCC-SLP 12/13/2021, 10:48 AM  [facility] [address] [place], [place], [address] Phone: [phone]   Fax:  [phone]

## 2021-12-13 NOTE — Progress Notes (Signed)
Message sent to Jeannett Senior at [facility] for incontinence supplies.

## 2021-12-14 DIAGNOSIS — I6930 Unspecified sequelae of cerebral infarction: Secondary | ICD-10-CM | POA: Diagnosis not present

## 2021-12-14 LAB — FACTOR 5 LEIDEN

## 2021-12-14 LAB — PROTEIN C, TOTAL: Protein C, Total: 110 % (ref 60–150)

## 2021-12-15 ENCOUNTER — Ambulatory Visit: Payer: Medicaid Other | Admitting: Physical Therapy

## 2021-12-15 ENCOUNTER — Other Ambulatory Visit: Payer: Self-pay

## 2021-12-15 ENCOUNTER — Encounter: Payer: Medicaid Other | Admitting: Speech Pathology

## 2021-12-15 DIAGNOSIS — I6930 Unspecified sequelae of cerebral infarction: Secondary | ICD-10-CM | POA: Diagnosis not present

## 2021-12-15 NOTE — Patient Outreach (Signed)
[facility]) Care Management  12/15/2021  Maria Porter Feb 28, 1976 [id number]    EMMI-STROKE RED ON EMMI ALERT Day # 1 Date: 12/14/2021 Red Alert Reason: "Problems setting up rehab? Yes"   Outreach attempt # 1 to patient. Spoke with patient who denies any cute issues or concerns a present. States she is doing well. Sh has supportive CNA that is in the home helping her out. Reviewed and addressed red alert. Patient voices errors in response. Sh confirms that she starts rehab next week. She has transportation to get to appts. She also goes for PCP follow up appt next week. Denies any issues with meds. Advised patient that they would continue to get automated EMMI-Stroke post discharge calls to assess how they are doing following recent hospitalization and will receive a call from a nurse if any of their responses were abnormal. Patient voiced understanding and was appreciative of f/u call.    Plan: RN CM will close case.   Maria Fairy, RN,BSN,CCM [facility] Care Management Telephonic Care Management Coordinator Direct Phone: [phone] Toll Free: [phone] Fax: [phone]

## 2021-12-18 DIAGNOSIS — I6930 Unspecified sequelae of cerebral infarction: Secondary | ICD-10-CM | POA: Diagnosis not present

## 2021-12-19 ENCOUNTER — Ambulatory Visit: Payer: Medicaid Other | Admitting: Physical Therapy

## 2021-12-19 ENCOUNTER — Encounter: Payer: Medicaid Other | Admitting: Speech Pathology

## 2021-12-19 ENCOUNTER — Ambulatory Visit (INDEPENDENT_AMBULATORY_CARE_PROVIDER_SITE_OTHER): Payer: Medicaid Other | Admitting: Student

## 2021-12-19 DIAGNOSIS — Z0000 Encounter for general adult medical examination without abnormal findings: Secondary | ICD-10-CM

## 2021-12-19 DIAGNOSIS — F17200 Nicotine dependence, unspecified, uncomplicated: Secondary | ICD-10-CM

## 2021-12-19 DIAGNOSIS — I6930 Unspecified sequelae of cerebral infarction: Secondary | ICD-10-CM

## 2021-12-19 DIAGNOSIS — E038 Other specified hypothyroidism: Secondary | ICD-10-CM

## 2021-12-19 DIAGNOSIS — F17210 Nicotine dependence, cigarettes, uncomplicated: Secondary | ICD-10-CM | POA: Diagnosis not present

## 2021-12-19 DIAGNOSIS — R7303 Prediabetes: Secondary | ICD-10-CM

## 2021-12-19 LAB — PROTHROMBIN GENE MUTATION

## 2021-12-19 MED ORDER — NICOTINE 7 MG/24HR TD PT24: 7.0000 mg | MEDICATED_PATCH | TRANSDERMAL | 1 refills | Status: DC

## 2021-12-19 NOTE — Assessment & Plan Note (Addendum)
Patient presents today for hospital follow-up of CVA from 12/10/2021. Patient has continued to take eliquis and aspirin. Patient reports that she does not have any residual dysarthria or headache, dizziness, or lightheadedness. Patient endorses that she does continue to have urinary incontinence, and we contacted [employer] for supplies prior to discharge. Patient reports that she is able to complete her ADLs and ambulates with a walker. Patient has follow up with PT and OT, Neurology, and Cardiology.  Exam/Labs: -Hypercoagulability labs within normal limits - Last LDL = 61 -Neuro exam is at baseline with no new focal deficits  A&P: Prior to this hospitalization, Neurology stopped patient's Eliquis on 7/24 when there was no evidence of DVT and patient presented for CVA on 8/6. Patient has had a documented history of CVA after stopping Eliquis in April 2023 and August 2023, which suggests that patient potentially has an underlying embolic source that has not yet been identified. On admission for CVA in Sept 2021 and again in May 2022 she was only on aspirin for anticoagulation. Based on this pattern, we recommend that patient remains on Eliquis and aspirin indefinitely for secondary prevention. As she is following closely with Neurology, we will plan to discuss with them to ensure we are in agreement with anticoagulation plan going forward. We discussed with patient the importance of controlling modifiable risk factors. Overall she has made significant progress in quitting smoking, BP today was only slightly elevated at 133/98, LDL was <70, and Hba1C was 6.2. We will consider an even lower LDL goal for her if she has another stroke while on Eliquis and Aspirin.  Plan: -Continue Eliquis and Aspirin -We will submit forms for [employer] incontinence supplies -Follow up with PT/OT, Neuro, Cardiology

## 2021-12-19 NOTE — Assessment & Plan Note (Addendum)
Patient's levothyroxine was increased to during her recent hospital admission. TSH was 17.28. Patient reports that she feels well on this dose and reports good mood and denies appetite changes. She does endorse some fatigue but attributes this to recent admission.  Exam/Labs: -Exam was unremarkable for thyroid nodules or swelling  Plan: -Recheck TSH next visit -Continue Levothyroxine 

## 2021-12-19 NOTE — Progress Notes (Signed)
Subjective:   Patient ID: Maria Porter female   DOB: Dec 31, 1975 46 y.o.   MRN: [id number]  HPI: [name] is a 46 y.o. with a pmhx of five CVAs, prediabetes, tobacco use who presents for hospital follow up. Please see Plan for individualized problem-based charting.   Patient Active Problem List   Diagnosis Date Noted   Acute ischemic right MCA stroke (HCC) 12/10/2021   GERD (gastroesophageal reflux disease) 07/20/2021   Right shoulder pain 05/23/2021   Healthcare maintenance 03/07/2021   History of DVT (deep vein thrombosis) 10/06/2020   Family history of premature coronary heart disease 09/14/2020   OSA (obstructive sleep apnea) 09/14/2020   Pre-diabetes 02/03/2020   Subclinical hypothyroidism 02/03/2020   Tobacco use disorder 01/27/2020   Hyperlipemia 01/27/2020   Obesity 01/27/2020   History of CVA with residual deficit 01/26/2020   Extrinsic asthma 10/09/2004     Current Outpatient Medications  Medication Sig Dispense Refill   nicotine (NICODERM CQ - DOSED IN MG/24 HR) 7 mg/24hr patch Place 1 patch (7 mg total) onto the skin daily. 30 patch 1   acetaminophen (TYLENOL) 325 MG tablet Take 2 tablets (650 mg total) by mouth every 6 (six) hours as needed for mild pain (or Fever >/= 101). 30 tablet 2   albuterol (PROVENTIL) (2.5 MG/3ML) 0.083% nebulizer solution Take 3 mLs (2.5 mg total) by nebulization every 4 (four) hours as needed for wheezing or shortness of breath. 75 mL 2   apixaban (ELIQUIS) 5 MG TABS tablet Take 1 tablet (5 mg total) by mouth 2 (two) times daily. 60 tablet 2   aspirin EC 81 MG tablet Take 1 tablet (81 mg total) by mouth daily. Swallow whole. (Patient taking differently: Take 81 mg by mouth daily.) 30 tablet 11   atorvastatin (LIPITOR) 80 MG tablet Take 1 tablet (80 mg total) by mouth daily. 90 tablet 1   cetirizine (ZYRTEC) 10 MG tablet Take 10 mg by mouth daily.     ezetimibe (ZETIA) 10 MG tablet Take 1 tablet (10 mg total) by mouth daily.  90 tablet 3   fluticasone (FLONASE) 50 MCG/ACT nasal spray Place 1 spray into both nostrils daily.     levothyroxine (SYNTHROID) 175 MCG tablet Take 1 tablet (175 mcg total) by mouth daily at 6 (six) AM. 30 tablet 2   ondansetron (ZOFRAN) 4 MG tablet Take 1 tablet (4 mg total) by mouth every 8 (eight) hours as needed for nausea or vomiting. 20 tablet 0   pantoprazole (PROTONIX) 40 MG tablet TAKE 1 TABLET BY MOUTH EVERY DAY 30 tablet 1   polyethylene glycol (MIRALAX / GLYCOLAX) 17 g packet Take 17 g by mouth daily as needed for mild constipation or moderate constipation.     VENTOLIN HFA 108 (90 Base) MCG/ACT inhaler INHALE 1-2 PUFFS BY MOUTH EVERY 6 HOURS AS NEEDED FOR WHEEZE OR SHORTNESS OF BREATH (Patient taking differently: Inhale 1-2 puffs into the lungs every 6 (six) hours as needed for wheezing or shortness of breath.) 18 each 3   No current facility-administered medications for this visit.     Review of Systems:   Review of systems is negative other than what is noted in individual problem-based charting.    Objective:   Physical Exam: Vitals:   12/19/21 1510  BP: (!) 133/98  Pulse: 96  Temp: 98.3 F (36.8 C)  TempSrc: Oral  SpO2: 98%  Weight: 228 lb 8 oz (103.6 kg)  Height: 5\' 6"  (1.676 m)  Physical Exam Constitutional:      General: She is not in acute distress.    Appearance: Normal appearance.  HENT:     Head: Normocephalic and atraumatic.     Comments: No thyroid nodules or swelling appreciated. Cardiovascular:     Rate and Rhythm: Normal rate and regular rhythm.     Heart sounds: No murmur heard.    No friction rub. No gallop.  Pulmonary:     Effort: Pulmonary effort is normal. No respiratory distress.     Breath sounds: Normal breath sounds. No wheezing or rales.  Abdominal:     General: Abdomen is flat. There is no distension.     Palpations: Abdomen is soft.     Tenderness: There is no abdominal tenderness. There is no guarding.  Neurological:      Mental Status: She is alert. Mental status is at baseline.     Sensory: Sensory deficit present.     Comments: No dysarthria.  Patient has L-sided hypesthesia and RUE hemiparesis at baseline.   Neuro exam was at baseline with no new focal deficits.        Assessment & Plan:   History of CVA with residual deficit Patient presents today for hospital follow-up of CVA from 12/10/2021. Patient has continued to take eliquis and aspirin. Patient reports that she does not have any residual dysarthria or headache, dizziness, or lightheadedness. Patient endorses that she does continue to have urinary incontinence, and we contacted [employer] for supplies prior to discharge. Patient reports that she is able to complete her ADLs and ambulates with a walker. Patient has follow up with PT and OT, Neurology, and Cardiology.  Exam/Labs: -Hypercoagulability labs within normal limits - Last LDL = 61 -Neuro exam is at baseline with no new focal deficits  A&P: Prior to this hospitalization, Neurology stopped patient's Eliquis on 7/24 when there was no evidence of DVT and patient presented for CVA on 8/6. Patient has had a documented history of CVA after stopping Eliquis in April 2023 and August 2023, which suggests that patient potentially has an underlying embolic source that has not yet been identified. On admission for CVA in Sept 2021 and again in May 2022 she was only on aspirin for anticoagulation. Based on this pattern, we recommend that patient remains on Eliquis and aspirin indefinitely for secondary prevention. As she is following closely with Neurology, we will plan to discuss with them to ensure we are in agreement with anticoagulation plan going forward. We discussed with patient the importance of controlling modifiable risk factors. Overall she has made significant progress in quitting smoking, BP today was only slightly elevated at 133/98, LDL was <70, and Hba1C was 6.2.  Plan: -Continue Eliquis and  Aspirin -We will submit forms for [employer] incontinence supplies -Follow up with PT/OT, Neuro, Cardiology    Subclinical hypothyroidism Patient's levothyroxine was increased to 143mcg during her recent hospital admission. TSH was 17.28. Patient reports that she feels well on this dose and reports good mood and denies appetite changes. She does endorse some fatigue but attributes this to recent admission.  Exam/Labs: -Exam was unremarkable for thyroid nodules or swelling  Plan: -Recheck TSH next visit -Continue Levothyroxine 130mcg  Tobacco use disorder Patient reports that she smokes about 3 cigarettes a day. She reports that she feels the Wellbutrin is working well for her and helps reduce her cravings. She also expressed interest in trying nicotine patches.  Plan: -Start nicotine 7mg  patches  -Continue buproprion   Pre-diabetes  Patient has chronic stable prediabetes. Patient's last Hba1C was 6.2 from last week. We discussed the importance of having a good diet and exercise. We will continue to monitor.  Plan: -Next Hba1C due November   Healthcare maintenance Based on patient's documented smoking history, patient has smoked at least 30 pack years. We did not have time to discuss low-dose CT scan today but will revisit for next appointment.

## 2021-12-19 NOTE — Assessment & Plan Note (Addendum)
Patient reports that she smokes about 3 cigarettes a day. She reports that she feels the Wellbutrin is working well for her and helps reduce her cravings. She also expressed interest in trying nicotine patches.  Plan: -Start nicotine 7mg  patches  -Continue buproprion

## 2021-12-19 NOTE — Therapy (Signed)
OUTPATIENT PHYSICAL THERAPY NEURO EVALUATION   [name] MRN: [id number] DOB:06/22/1975, 46 y.o., female Today's Date: 12/20/2021   PCP: Belva Agee, MD REFERRING PROVIDER: Inez Catalina, MD   PT End of Session - 12/20/21 1059     Visit Number 1    Number of Visits 2    Date for PT Re-Evaluation 01/10/22    Authorization Type U[employer] Medicaid - pt has 9 visits remaining for 2023 for combined disciplines    PT Start Time 1019    PT Stop Time 1058    PT Time Calculation (min) 39 min    Equipment Utilized During Treatment Gait belt    Activity Tolerance Patient tolerated treatment well    Behavior During Therapy [facility] for tasks assessed/performed             Past Medical History:  Diagnosis Date   Acute deep vein thrombosis of right iliac vein (HCC) 09/14/2020   Stroke [facility])    Past Surgical History:  Procedure Laterality Date   BUBBLE STUDY  01/29/2020   Procedure: BUBBLE STUDY;  Surgeon: Little Ishikawa, MD;  Location: [facility] ENDOSCOPY;  Service: Cardiovascular;;   TEE WITHOUT CARDIOVERSION N/A 01/29/2020   Procedure: TRANSESOPHAGEAL ECHOCARDIOGRAM (TEE);  Surgeon: Little Ishikawa, MD;  Location: [facility] ENDOSCOPY;  Service: Cardiovascular;  Laterality: N/A;   Patient Active Problem List   Diagnosis Date Noted   Acute ischemic right MCA stroke (HCC) 12/10/2021   GERD (gastroesophageal reflux disease) 07/20/2021   Right shoulder pain 05/23/2021   Healthcare maintenance 03/07/2021   History of DVT (deep vein thrombosis) 10/06/2020   Family history of premature coronary heart disease 09/14/2020   OSA (obstructive sleep apnea) 09/14/2020   Pre-diabetes 02/03/2020   Subclinical hypothyroidism 02/03/2020   Tobacco use disorder 01/27/2020   Hyperlipemia 01/27/2020   Obesity 01/27/2020   History of CVA with residual deficit 01/26/2020   Extrinsic asthma 10/09/2004    ONSET DATE: 12/09/21  REFERRING DIAG: I63.511 (ICD-10-CM) - Acute  ischemic right MCA stroke (HCC) I69.30 (ICD-10-CM) - History of CVA with residual deficit  THERAPY DIAG:  Muscle weakness (generalized)  Unsteadiness on feet  Rationale for Evaluation and Treatment Rehabilitation  SUBJECTIVE:                                                                                                                                                                                              SUBJECTIVE STATEMENT: Patient reports that she was taken off Eliquis for 2 weeks and in that time she had another stroke. Since this most recent stroke, she is more tired such as when  taking a shower. Hx of prior strokes affected the R UE>LE strength and sensation in the L UE/LE and notes that she has a limp when she walks. Denies changes in sensation, weakness, balance since this most recent episode. Pt accompanied by: self  PERTINENT HISTORY: DVT 09/2020 on eliquis, financial strain, left PICA CVA, left medial cerebellar and frontal/subcortical CVAs, left MCA occlusion w/ hemiparesis (01/27/2020), smoking, right occipital CVA w/ concurrent right superior cerebellar artery infarct 09/2021, cocaine use, hyperlipidemia  PAIN:  Are you having pain? No  PRECAUTIONS: Fall  WEIGHT BEARING RESTRICTIONS No  FALLS: Has patient fallen in last 6 months? No  LIVING ENVIRONMENT: Lives with: lives alone; son in town from [place] for 3 weeks Lives in: House/apartment Stairs: No Has following equipment at home: Single point cane, Environmental consultant - 2 wheeled, Environmental consultant - 4 wheeled, Tour manager, and elevated commode  PLOF: Independent with basic ADLs; has a CNA daily to assist with dishes, cleaning   PATIENT GOALS to work on walking more and working on tiredness   OBJECTIVE:   DIAGNOSTIC FINDINGS:  12/10/21 brain MRI: Small acute cortical infarcts scattered in the right MCA distribution.  Multiple remote infarcts with progression from 2021, especially in the superior right  cerebellum.  COGNITION: Overall cognitive status: No family/caregiver present to determine baseline cognitive functioning   SENSATION: B WFL with light touch testing but patient noting diminished sensation L UE/LE  COORDINATION: Alt toe tap intact B; alt pronation/supination with bradykinesia and dysmetria B Ues   EDEMA:  none  POSTURE: No Significant postural limitations  LOWER EXTREMITY ROM:     Active  Right Eval Left Eval  Hip flexion    Hip extension    Hip abduction    Hip adduction    Hip internal rotation    Hip external rotation    Knee flexion    Knee extension    Ankle dorsiflexion 1 8  Ankle plantarflexion    Ankle inversion    Ankle eversion     (Blank rows = not tested)  LOWER EXTREMITY MMT:    MMT (in sitting) Right Eval Left Eval  Hip flexion 4- 4+  Hip extension    Hip abduction 4+ 4+  Hip adduction 4 4  Hip internal rotation    Hip external rotation    Knee flexion 4+ 4+  Knee extension 4+ 4  Ankle dorsiflexion 4+ 4+  Ankle plantarflexion 4+ 4+  Ankle inversion    Ankle eversion    (Blank rows = not tested)  GAIT: Gait pattern:  Antalgia on L LE with L knee genu recurvatum in stance  Assistive device utilized: Environmental consultant - 2 wheeled Level of assistance: SBA   [facility] PT Assessment - 12/20/21 0001       Standardized Balance Assessment   Standardized Balance Assessment Five Times Sit to Stand;Timed Up and Go Test;Berg Balance Test    Five times sit to stand comments  13.42   without UEs; some posterior sway     Berg Balance Test   Sit to Stand Able to stand without using hands and stabilize independently    Standing Unsupported Able to stand safely 2 minutes    Sitting with Back Unsupported but Feet Supported on Floor or Stool Able to sit safely and securely 2 minutes    Stand to Sit Sits safely with minimal use of hands    Transfers Able to transfer safely, minor use of hands    Standing Unsupported with Eyes Closed Able  to stand 10  seconds with supervision    Standing Unsupported with Feet Together Able to place feet together independently and stand for 1 minute with supervision    From Standing, Reach Forward with Outstretched Arm Can reach confidently >25 cm (10")    From Standing Position, Pick up Object from Floor Able to pick up shoe, needs supervision    From Standing Position, Turn to Look Behind Over each Shoulder Looks behind from both sides and weight shifts well    Turn 360 Degrees Able to turn 360 degrees safely but slowly    Standing Unsupported, Alternately Place Feet on Step/Stool Able to complete 4 steps without aid or supervision    Standing Unsupported, One Foot in Front Able to take small step independently and hold 30 seconds    Standing on One Leg Able to lift leg independently and hold equal to or more than 3 seconds    Total Score 45      Timed Up and Go Test   Normal TUG (seconds) 22.85   with RW             PATIENT EDUCATION: Education details: advised to use 4WW when walking outside for fitness as patient currently is using cane; prognosis, POC, HEP- with edu on safety Person educated: Patient Education method: Explanation, Demonstration, Tactile cues, Verbal cues, and Handouts Education comprehension: verbalized understanding and returned demonstration   HOME EXERCISE PROGRAM: Access Code: [email or web address] URL: [email or web address] Date: 12/20/2021 Prepared by: [facility] - [facility]  Exercises - Sit to Stand with Arms Crossed  - 1 x daily - 5 x weekly - 2 sets - 10 reps - Narrow Stance with Counter Support  - 1 x daily - 5 x weekly - 2 sets - 10 reps - Standing Balance in Corner with Eyes Closed  - 1 x daily - 5 x weekly - 2 sets - 10 reps - Standing Toe Taps  - 1 x daily - 5 x weekly - 2 sets - 10 reps - Sitting Knee Extension with Resistance  - 1 x daily - 5 x weekly - 2 sets - 10 reps    GOALS: Goals reviewed with patient? Yes  SHORT  TERM GOALS: Target date: 01/03/2022  Patient to be independent with initial HEP. Baseline: HEP initiated Goal status: INITIAL    LONG TERM GOALS: Target date: 01/10/2022  Patient to be independent with advanced HEP. Baseline: Not yet initiated  Goal status: INITIAL   Patient to report compliance with walking program with son or CNA supervision and [phone] for safety.   Baseline: currently ambulating with SPC outside  Goal status: INITIAL    ASSESSMENT:  CLINICAL IMPRESSION:  Patient is a 46 y/o F presenting to OPPT with c/o fatigue s/p R MCA stroke on 12/09/21. Patient with existing R hemiplegia and decreased L UE/LE sensation from prior strokes. Patient lives alone and reports limited endurance for ADLs and normal mobility at this time. Patient today presenting with decreased B UE coordination, diminished L UE/LE sensation, limited R>L ankle dorsiflexion AROM, decreased hip and L knee strength, gait deviations, and imbalance, and decreased gait speed. Patient's balance testing indicates an increased risk of falls. Patient was educated on gentle balance and strengthening HEP and reported understanding. Would benefit from skilled PT services for 1 additional visit d/t limited insurance coverage.   OBJECTIVE IMPAIRMENTS Abnormal gait, decreased balance, decreased coordination, decreased endurance, decreased knowledge of use of DME, difficulty walking, decreased ROM,  decreased strength, decreased safety awareness, impaired flexibility, and improper body mechanics.   ACTIVITY LIMITATIONS carrying, lifting, bending, standing, squatting, stairs, bathing, dressing, reach over head, and locomotion level  PARTICIPATION LIMITATIONS: meal prep, shopping, community activity, and church  PERSONAL FACTORS Age, Past/current experiences, Time since onset of injury/illness/exacerbation, and 3+ comorbidities: DVT 09/2020 on eliquis, financial strain, left PICA CVA, left medial cerebellar and  frontal/subcortical CVAs, left MCA occlusion w/ hemiparesis (01/27/2020), smoking, right occipital CVA w/ concurrent right superior cerebellar artery infarct 09/2021, cocaine use, hyperlipidemia  are also affecting patient's functional outcome.   REHAB POTENTIAL: Good  CLINICAL DECISION MAKING: Evolving/moderate complexity  EVALUATION COMPLEXITY: Moderate  PLAN: PT FREQUENCY:  2 visits  PT DURATION: 3 weeks  PLANNED INTERVENTIONS: Therapeutic exercises, Therapeutic activity, Neuromuscular re-education, Balance training, Gait training, Patient/Family education, Self Care, Joint mobilization, Stair training, Vestibular training, Canalith repositioning, DME instructions, Aquatic Therapy, Dry Needling, Electrical stimulation, Cryotherapy, Moist heat, Taping, Manual therapy, and Re-evaluation  PLAN FOR NEXT SESSION: reassess and update HEP; see how patient is doing with walking program and assess safety with SPC   Anette Guarneri, PT, DPT 12/20/21 11:59 AM  [facility] [address] [place], [place] [address] Phone # [phone] Fax # [phone]

## 2021-12-19 NOTE — Assessment & Plan Note (Signed)
Based on patient's documented smoking history, patient has smoked at least 30 pack years. We did not have time to discuss low-dose CT scan today but will revisit for next appointment.

## 2021-12-19 NOTE — Assessment & Plan Note (Signed)
Patient has chronic stable prediabetes. Patient's last Hba1C was 6.2 from last week. We discussed the importance of having a good diet and exercise. We will continue to monitor.  Plan: -Next Hba1C due November

## 2021-12-19 NOTE — Patient Instructions (Addendum)
Thank you, Ms.Novella Olive for allowing [place] to provide your care today. Today we saw you for a hospital follow-up visit.     We did not order any labs today, but you will be due for bloodwork next visit.   I have ordered the following medication/changed the following medications:  begin Nicotine patch, 1 patch/day as needed for smoking cessation.  Please follow-up in in 4 weeks.   Should you have any questions or concerns please call the internal medicine clinic at [phone].     My Chart Access: [email or web address]

## 2021-12-20 ENCOUNTER — Other Ambulatory Visit: Payer: Self-pay

## 2021-12-20 ENCOUNTER — Encounter: Payer: Self-pay | Admitting: Student

## 2021-12-20 ENCOUNTER — Ambulatory Visit: Payer: Medicaid Other | Attending: Nurse Practitioner

## 2021-12-20 ENCOUNTER — Encounter: Payer: Self-pay | Admitting: Physical Therapy

## 2021-12-20 ENCOUNTER — Encounter: Payer: Medicaid Other | Admitting: Occupational Therapy

## 2021-12-20 ENCOUNTER — Ambulatory Visit: Payer: Medicaid Other | Admitting: Physical Therapy

## 2021-12-20 ENCOUNTER — Encounter: Payer: Medicaid Other | Admitting: Speech Pathology

## 2021-12-20 DIAGNOSIS — R482 Apraxia: Secondary | ICD-10-CM | POA: Diagnosis present

## 2021-12-20 DIAGNOSIS — R2681 Unsteadiness on feet: Secondary | ICD-10-CM | POA: Diagnosis present

## 2021-12-20 DIAGNOSIS — M6281 Muscle weakness (generalized): Secondary | ICD-10-CM | POA: Insufficient documentation

## 2021-12-20 DIAGNOSIS — R4701 Aphasia: Secondary | ICD-10-CM | POA: Insufficient documentation

## 2021-12-20 DIAGNOSIS — I6930 Unspecified sequelae of cerebral infarction: Secondary | ICD-10-CM | POA: Diagnosis not present

## 2021-12-20 DIAGNOSIS — R471 Dysarthria and anarthria: Secondary | ICD-10-CM | POA: Diagnosis present

## 2021-12-20 NOTE — Therapy (Addendum)
OUTPATIENT SPEECH LANGUAGE PATHOLOGY APHASIA EVALUATION   [name] MRN: [id number] DOB:1975-05-29, 46 y.o., female Today's Date: 12/20/2021  PCP: Domenic Schwab, MD (documentation) REFERRING PROVIDER: Inez Catalina, MD   End of Session - 12/20/21 1049     Visit Number 1    Number of Visits 6    Date for SLP Re-Evaluation 02/18/22    Authorization Type UHC Medicaid - pt has 9 total visits remaining for 2023 - chose 5/9 ST    SLP Start Time 0933    SLP Stop Time  1015    SLP Time Calculation (min) 42 min    Activity Tolerance Patient tolerated treatment well             Past Medical History:  Diagnosis Date   Acute deep vein thrombosis of right iliac vein (HCC) 09/14/2020   Stroke [facility])    Past Surgical History:  Procedure Laterality Date   BUBBLE STUDY  01/29/2020   Procedure: BUBBLE STUDY;  Surgeon: Little Ishikawa, MD;  Location: [facility] ENDOSCOPY;  Service: Cardiovascular;;   TEE WITHOUT CARDIOVERSION N/A 01/29/2020   Procedure: TRANSESOPHAGEAL ECHOCARDIOGRAM (TEE);  Surgeon: Little Ishikawa, MD;  Location: [facility] ENDOSCOPY;  Service: Cardiovascular;  Laterality: N/A;   Patient Active Problem List   Diagnosis Date Noted   Acute ischemic right MCA stroke (HCC) 12/10/2021   GERD (gastroesophageal reflux disease) 07/20/2021   Right shoulder pain 05/23/2021   Healthcare maintenance 03/07/2021   History of DVT (deep vein thrombosis) 10/06/2020   Family history of premature coronary heart disease 09/14/2020   OSA (obstructive sleep apnea) 09/14/2020   Pre-diabetes 02/03/2020   Subclinical hypothyroidism 02/03/2020   Tobacco use disorder 01/27/2020   Hyperlipemia 01/27/2020   Obesity 01/27/2020   History of CVA with residual deficit 01/26/2020   Extrinsic asthma 10/09/2004    ONSET DATE: referral date 12-11-21   REFERRING DIAG:  I63.511 (ICD-10-CM) - Acute ischemic right MCA stroke (HCC)  I69.30 (ICD-10-CM) - History of CVA with  residual deficit    THERAPY DIAG:  Aphasia  Verbal apraxia  Dysarthria and anarthria  Rationale for Evaluation and Treatment Rehabilitation  SUBJECTIVE:   SUBJECTIVE STATEMENT: "My speaking is the same - but it bothers me I can't find the words I want to say." Pt accompanied by: self  PERTINENT HISTORY: 46 year old lady with multiple recurrent strokes (left frontal MCA branch infarct 01/2020, L MCA stroke 09/2020 s/p thrombectomy and most recent right cerebellar stroke 08/2021 s/p ILR) with resultant right spastic hemiparesis, speech/language impairment and left-sided numbness. Latest CVA 12/09/21, pt with SLE on 12/10/21 indicating mod-severe dysarthria, cognitive communication deficits, and aphasia.  PAIN:  Are you having pain? No  FALLS: Has patient fallen in last 6 months?  See PT evaluation for details  LIVING ENVIRONMENT: Lives with: lives with their family and lives alone Lives in: House/apartment  PLOF:  Level of assistance: Independent with ADLs Employment: On disability   PATIENT GOALS Pt indicated she would like to engage more in conversations and feel more confident in finding the words she wants to use.  OBJECTIVE:   DIAGNOSTIC FINDINGS:  MR BRAIN - 12/10/21 IMPRESSION: 1. Small acute cortical infarcts scattered in the right MCA distribution. 2. Multiple remote infarcts with progression from 2021, especially in the superior right cerebellum.  COGNITION: Overall cognitive status: Difficult to assess due to time constraints and no family present    AUDITORY COMPREHENSION: Overall auditory comprehension: Appears intact YES/NO questions: Appears intact Following directions: Appears intact  Conversation: Moderately Complex     READING COMPREHENSION: Intact   EXPRESSION: verbal   VERBAL EXPRESSION: Level of generative/spontaneous verbalization: conversation Repetition: Impaired: Longer sentences exhibited incr'd difficulty - ? Impact of verbal  apraxia Naming: Confrontation: 76-100% and Divergent: names x8 animals and x3 "m" words in 60 seconds a piece Pragmatics: Appears intact Discourse task: SLP engaged pt in discussion about her medical history and her occupational history: Discourse characterized by rare vague language, word finding episodes, agrammatism, and rarely difficult to follow pt's train of thoguht due to her attempted usage of synonym/circomlocution. In 15 minutes of discourse pt had 4 anomic events in which pt used extra time successfully x3, and once pt relied on SLP for mod cues for her to name "utilities". Pt also, in that 15 minute conversation, exhibited one dysnomic event ("months" for "seasons") without awareness. Interfering components: premorbid deficit Effective technique: phonemic cues, and articulatory cues; extra time   WRITTEN EXPRESSION: Dominant hand: left            Written expression: Appears intact at sentence level    MOTOR SPEECH: Overall motor speech: impaired Level of impairment: Word, Phrase, Sentence, and Conversation Respiration: thoracic breathing Phonation: normal Resonance: WFL Articulation: Impaired: word, phrase, sentence, and conversation Intelligibility: Intelligibility reduced to approximately 95%.   Motor planning: Appears intact Motor speech errors: aware Interfering components:  Foreign accent syndrome Effective technique: slow rate, over articulate, pause, and pacing. When SLP requested repeat x3 during 15 minute discourse pt responded with 100% intelligibility. Pt's SLE in acute care indicated mod to severe dysarthria; SLP's opinion is that pt's speech intelligibility has mostly/completely spontaneously resolved since 12/10/21, as pt named aphasia her major area of deficit.     ORAL MOTOR EXAMINATION Overall status: Impaired:   Labial: Left (Coordination) Lingual: Left (Coordination) Comments: slight reduced strength on lt      PATIENT REPORTED OUTCOME MEASURES  (PROM): Communication Participation Item Bank: 1/30 (higher scores indicate better QOL).  Pt identified 4 people she speaks with weekly, would like to improve word finding in each of these situations.     TODAY'S TREATMENT:  Initiated education and coaching on use of description strategy to improve effectiveness of her communication. SLP re-initiated home exercise program with emphasis on home tasks such as Primary school teacher.      PATIENT EDUCATION: Education details: evaluation results, SLP recommendations, POC/goals Person educated: Patient Education method: Explanation, Demonstration, Verbal cues, and Handouts Education comprehension: verbalized understanding, returned demonstration, and needs further education     GOALS: Goals reviewed with patient? Yes   SHORT TERM GOALS: Target date: 01/19/2022   Pt will successfully utilize description strategy in structured language tasks, given occasional min-A in 80% of opportunities over 2 sessions.  Baseline: not using description strategy  Goal status: INITIAL   2.  Pt will name 10 items in x3 personally relevant categories over 2 sessions with occasional min-A.  Baseline: x4 items during divergent naming task Goal status: INITIAL   3.  Pt will identify x2 opportunities to speak to people outside of typically talked to family members, and have brief conversational exchange, self-reporting a score of 2/5 for communication effectiveness.  Baseline: 0/5 self-rating for efficacy when talking to people outside of immediate family members and best friend  Goal status: INITIAL     LONG TERM GOALS: Target date: 02/18/2022   Pt will successfully utilize description strategy or other strategies in 15 minute moderately complex conversational exchange in 80% of opportunities  Baseline: not  using description strategy Goal status: INITIAL   2.  Pt will improve her score on the Communication Participation Item Bank short form by 5 points   Baseline: initial score of 1 Goal status: INITIAL   3.  Pt will report carryover of anomia compensations, resulting in subjective feeling if improved communication effectiveness while out in community, over 1 week period.  Baseline: pt is not talking while out in community Goal status: INITIAL     ASSESSMENT:   CLINICAL IMPRESSION: Patient is a 46 y.o. female who was seen today primarily for aphasia, in presence of baseline of aphasia, foreign accent disorder and dysarthria, following multiple strokes - latest 12-11-21. Reka states she talks to family, rarely and her best friend semi-daily. Today in conversation pt only used circumlocution and synonym strategies, rarely. Extra time was pt's most-used and best strategy. Pt demonstrates mild-mod expressive aphasia which she reports decreases her QOL especially in the community, but also with family members - she reports the only semi-regular conversation she has is with her friend [place]. Keysi tells SLP that she feels like more complex topics are very difficult for her because her anomia increases in frequency and then she cannot use strategies as successfully due to "getting flustered". Pt's mild-mod dysarthria is compensated for well, when pt is asked. Overall intelligibility is close to 95%. Due to limited visits pt's therapy goals were developed only with pt's desired area of improvement - anomia. Pt in agreement with POC.    OBJECTIVE IMPAIRMENTS include expressive language, aphasia, and dysarthria. These impairments are limiting patient from return to work and effectively communicating at home and in community. Factors affecting potential to achieve goals and functional outcome are previous level of function, financial resources, and family/community support. Patient will benefit from skilled SLP services to address above impairments and improve overall function.   REHAB POTENTIAL: Fair limited visits available   PLAN: SLP FREQUENCY:  1x/week   SLP DURATION: 8 weeks   PLANNED INTERVENTIONS: Language facilitation, Cueing hierachy, Internal/external aids, Functional tasks, Multimodal communication approach, SLP instruction and feedback, Compensatory strategies, and Patient/family education   [facility], CCC-SLP 12/20/2021, 2:31 PM

## 2021-12-20 NOTE — Addendum Note (Signed)
Addended by: Verdie Mosher B on: 12/20/2021 02:33 PM   Modules accepted: Orders

## 2021-12-20 NOTE — Patient Instructions (Signed)
   You have aphasia (trouble coming up with words) and verbal apraxia (trouble making muscle movements for words), with some dysarthria (slurred speech).  Do 1 of the describing worksheets (4 items per page) Maria Porter had you do for homework on Monday, Wednesday, and Friday until November 1st. Then do two items twice a week until May 07, 2022.  TALK SLOWLY AND OVER-ARTICULATE!  We will work to make your word finding better so you can feel more confident about conversations you have with others.

## 2021-12-20 NOTE — Patient Outreach (Signed)
[facility]) Care Management  12/20/2021  Peytin Dechert 07-05-1975 [id number]   EMMI- Stroke RED ON EMMI ALERT Day # 6 Date: 12/19/21 Red Alert Reason:  Smoked or been around smoke? Yes    Outreach attempt: Outreach to patient.  No answer.  Unable to leave a message.    Plan: RN CM will have assigned CM outreach within 4 business days.   This RN CM will also send a letter.     Bary Leriche, RN, MSN [facility] Care Management Care Management Coordinator Direct Line [phone] Toll Free: [phone]  Fax: [phone]

## 2021-12-21 ENCOUNTER — Other Ambulatory Visit: Payer: Self-pay

## 2021-12-21 ENCOUNTER — Telehealth: Payer: Self-pay | Admitting: *Deleted

## 2021-12-21 DIAGNOSIS — I6930 Unspecified sequelae of cerebral infarction: Secondary | ICD-10-CM | POA: Diagnosis not present

## 2021-12-21 NOTE — Patient Outreach (Signed)
[facility]) Care Management  12/21/2021  Maria Porter 1975/05/30 [id number]    EMMI-Stroke RED ON EMMI ALERT Day # 6 Date: 12/19/2021 Red Alert Reason: "Smoked or been around smoke? Yes"   Outreach attempt # 1 to patient.Spoke with patient. She reports she is doing fairly well Denies any acute issues or sxs at present. Reviewed and addressed red alert. Patient states she still smokes but trying to cut back. She is suing patches and feels like they are helping. Support and resources given. Son also in the home and smokes. Reviewed second hand smoke dangers and advised patient to have family smoke outside the home.  Advised patient that they would continue to get automated EMMI-Stroke post discharge calls to assess how they are doing following recent hospitalization and will receive a call from a nurse if any of their responses were abnormal. Patient voiced understanding and was appreciative of f/u call.       Plan: RN CM will close case.   Antionette Fairy, RN,BSN,CCM [facility] Care Management Telephonic Care Management Coordinator Direct Phone: [phone] Toll Free: [phone] Fax: [phone]

## 2021-12-21 NOTE — Telephone Encounter (Signed)
Patient PCS paper were faxed July 10-2021. Patient never called back with appointment time for in home nurse visit. Patient is receiving care from [facility] [phone]). Spoke with office. Patient is receiving 80 hours per month of PCS.

## 2021-12-21 NOTE — Progress Notes (Signed)
[facility] Attending  Case discussed with Dr. Amponsah  At the time of the visit.  We reviewed the resident's history and exam and pertinent patient test results.  I agree with the assessment, diagnosis, and plan of care documented in the resident's note.  

## 2021-12-22 DIAGNOSIS — I6930 Unspecified sequelae of cerebral infarction: Secondary | ICD-10-CM | POA: Diagnosis not present

## 2021-12-25 DIAGNOSIS — I6930 Unspecified sequelae of cerebral infarction: Secondary | ICD-10-CM | POA: Diagnosis not present

## 2021-12-26 ENCOUNTER — Encounter: Payer: Medicaid Other | Admitting: Occupational Therapy

## 2021-12-26 ENCOUNTER — Ambulatory Visit: Payer: Medicaid Other | Admitting: Physical Therapy

## 2021-12-26 ENCOUNTER — Encounter: Payer: Medicaid Other | Admitting: Speech Pathology

## 2021-12-26 ENCOUNTER — Ambulatory Visit: Payer: Medicaid Other

## 2021-12-26 DIAGNOSIS — I6930 Unspecified sequelae of cerebral infarction: Secondary | ICD-10-CM | POA: Diagnosis not present

## 2021-12-27 DIAGNOSIS — I6930 Unspecified sequelae of cerebral infarction: Secondary | ICD-10-CM | POA: Diagnosis not present

## 2021-12-28 ENCOUNTER — Encounter: Payer: Medicaid Other | Admitting: Occupational Therapy

## 2021-12-28 ENCOUNTER — Encounter: Payer: Medicaid Other | Admitting: Speech Pathology

## 2021-12-28 ENCOUNTER — Ambulatory Visit: Payer: Medicaid Other | Admitting: Physical Therapy

## 2021-12-28 DIAGNOSIS — I6930 Unspecified sequelae of cerebral infarction: Secondary | ICD-10-CM | POA: Diagnosis not present

## 2021-12-29 DIAGNOSIS — I6930 Unspecified sequelae of cerebral infarction: Secondary | ICD-10-CM | POA: Diagnosis not present

## 2021-12-30 ENCOUNTER — Other Ambulatory Visit: Payer: Self-pay | Admitting: Student

## 2022-01-01 ENCOUNTER — Ambulatory Visit: Payer: Medicaid Other

## 2022-01-01 ENCOUNTER — Ambulatory Visit: Payer: Medicaid Other | Admitting: Physical Therapy

## 2022-01-01 ENCOUNTER — Ambulatory Visit: Payer: Medicaid Other | Admitting: Occupational Therapy

## 2022-01-01 DIAGNOSIS — I6930 Unspecified sequelae of cerebral infarction: Secondary | ICD-10-CM | POA: Diagnosis not present

## 2022-01-02 ENCOUNTER — Encounter: Payer: Medicaid Other | Admitting: Speech Pathology

## 2022-01-02 ENCOUNTER — Encounter: Payer: Medicaid Other | Admitting: Occupational Therapy

## 2022-01-02 ENCOUNTER — Ambulatory Visit: Payer: Medicaid Other | Admitting: Physical Therapy

## 2022-01-02 DIAGNOSIS — I6930 Unspecified sequelae of cerebral infarction: Secondary | ICD-10-CM | POA: Diagnosis not present

## 2022-01-03 DIAGNOSIS — I6930 Unspecified sequelae of cerebral infarction: Secondary | ICD-10-CM | POA: Diagnosis not present

## 2022-01-04 ENCOUNTER — Encounter: Payer: Medicaid Other | Admitting: Speech Pathology

## 2022-01-04 ENCOUNTER — Ambulatory Visit: Payer: Medicaid Other | Admitting: Physical Therapy

## 2022-01-04 ENCOUNTER — Encounter: Payer: Medicaid Other | Admitting: Occupational Therapy

## 2022-01-04 DIAGNOSIS — I6930 Unspecified sequelae of cerebral infarction: Secondary | ICD-10-CM | POA: Diagnosis not present

## 2022-01-05 ENCOUNTER — Ambulatory Visit (INDEPENDENT_AMBULATORY_CARE_PROVIDER_SITE_OTHER): Payer: Medicaid Other

## 2022-01-05 DIAGNOSIS — I639 Cerebral infarction, unspecified: Secondary | ICD-10-CM

## 2022-01-05 DIAGNOSIS — I6930 Unspecified sequelae of cerebral infarction: Secondary | ICD-10-CM | POA: Diagnosis not present

## 2022-01-08 DIAGNOSIS — I6930 Unspecified sequelae of cerebral infarction: Secondary | ICD-10-CM | POA: Diagnosis not present

## 2022-01-08 LAB — CUP PACEART REMOTE DEVICE CHECK
Date Time Interrogation Session: 20230901161458
Implantable Pulse Generator Implant Date: 20230501

## 2022-01-09 ENCOUNTER — Ambulatory Visit: Payer: Medicaid Other | Attending: Nurse Practitioner

## 2022-01-09 ENCOUNTER — Ambulatory Visit: Payer: Medicaid Other | Admitting: Physical Therapy

## 2022-01-09 ENCOUNTER — Encounter: Payer: Self-pay | Admitting: Physical Therapy

## 2022-01-09 DIAGNOSIS — R4701 Aphasia: Secondary | ICD-10-CM | POA: Insufficient documentation

## 2022-01-09 DIAGNOSIS — R208 Other disturbances of skin sensation: Secondary | ICD-10-CM | POA: Insufficient documentation

## 2022-01-09 DIAGNOSIS — R2681 Unsteadiness on feet: Secondary | ICD-10-CM | POA: Insufficient documentation

## 2022-01-09 DIAGNOSIS — M6281 Muscle weakness (generalized): Secondary | ICD-10-CM | POA: Diagnosis present

## 2022-01-09 DIAGNOSIS — R482 Apraxia: Secondary | ICD-10-CM | POA: Insufficient documentation

## 2022-01-09 DIAGNOSIS — I6930 Unspecified sequelae of cerebral infarction: Secondary | ICD-10-CM | POA: Diagnosis not present

## 2022-01-09 DIAGNOSIS — R471 Dysarthria and anarthria: Secondary | ICD-10-CM | POA: Diagnosis present

## 2022-01-09 DIAGNOSIS — I69353 Hemiplegia and hemiparesis following cerebral infarction affecting right non-dominant side: Secondary | ICD-10-CM | POA: Insufficient documentation

## 2022-01-09 NOTE — Therapy (Signed)
OUTPATIENT SPEECH LANGUAGE PATHOLOGY TREATMENT   [name] MRN: [id number] DOB:12-12-1975, 46 y.o., female Today's Date: 01/09/2022  PCP: Domenic Schwab, MD (documentation) REFERRING PROVIDER: Inez Catalina, MD   End of Session - 01/09/22 1144     Visit Number 2    Number of Visits 6    Date for SLP Re-Evaluation 02/18/22    Authorization Type UHC Medicaid - pt has 9 total visits remaining for 2023 - chose 5/9 ST    Authorization - Visit Number 1    Authorization - Number of Visits 5    SLP Start Time 1101    SLP Stop Time  1133    SLP Time Calculation (min) 32 min    Activity Tolerance Patient tolerated treatment well              Past Medical History:  Diagnosis Date   Acute deep vein thrombosis of right iliac vein (HCC) 09/14/2020   Stroke [facility])    Past Surgical History:  Procedure Laterality Date   BUBBLE STUDY  01/29/2020   Procedure: BUBBLE STUDY;  Surgeon: Little Ishikawa, MD;  Location: [facility] ENDOSCOPY;  Service: Cardiovascular;;   TEE WITHOUT CARDIOVERSION N/A 01/29/2020   Procedure: TRANSESOPHAGEAL ECHOCARDIOGRAM (TEE);  Surgeon: Little Ishikawa, MD;  Location[facility] ENDOSCOPY;  Service: Cardiovascular;  Laterality: N/A;   Patient Active Problem List   Diagnosis Date Noted   Acute ischemic right MCA stroke (HCC) 12/10/2021   GERD (gastroesophageal reflux disease) 07/20/2021   Right shoulder pain 05/23/2021   Healthcare maintenance 03/07/2021   History of DVT (deep vein thrombosis) 10/06/2020   Family history of premature coronary heart disease 09/14/2020   OSA (obstructive sleep apnea) 09/14/2020   Pre-diabetes 02/03/2020   Subclinical hypothyroidism 02/03/2020   Tobacco use disorder 01/27/2020   Hyperlipemia 01/27/2020   Obesity 01/27/2020   History of CVA with residual deficit 01/26/2020   Extrinsic asthma 10/09/2004    ONSET DATE: referral date 12-11-21   REFERRING DIAG:  I63.511 (ICD-10-CM) - Acute ischemic  right MCA stroke (HCC)  I69.30 (ICD-10-CM) - History of CVA with residual deficit    THERAPY DIAG:  Aphasia  Verbal apraxia  Dysarthria and anarthria  Rationale for Evaluation and Treatment Rehabilitation  SUBJECTIVE:   SUBJECTIVE STATEMENT: "They tell me I don't have to rush to say what I want to say. It's (speech/language) is getting better." Pt accompanied by: self  PERTINENT HISTORY: 46 year old lady with multiple recurrent strokes (left frontal MCA branch infarct 01/2020, L MCA stroke 09/2020 s/p thrombectomy and most recent right cerebellar stroke 08/2021 s/p ILR) with resultant right spastic hemiparesis, speech/language impairment and left-sided numbness. Latest CVA 12/09/21, pt with SLE on 12/10/21 indicating mod-severe dysarthria, cognitive communication deficits, and aphasia.  PAIN:  Are you having pain? No  FALLS: Has patient fallen in last 6 months?  See PT evaluation for details  LIVING ENVIRONMENT: Lives with: lives with their family and lives alone Lives in: House/apartment  PLOF:  Level of assistance: Independent with ADLs Employment: On disability   PATIENT GOALS Pt indicated she would like to engage more in conversations and feel more confident in finding the words she wants to use.  OBJECTIVE:   DIAGNOSTIC FINDINGS:  MR BRAIN - 12/10/21 IMPRESSION: 1. Small acute cortical infarcts scattered in the right MCA distribution. 2. Multiple remote infarcts with progression from 2021, especially in the superior right cerebellum.      PATIENT REPORTED OUTCOME MEASURES (PROM): Communication Participation Item Bank: 1/30 (higher  scores indicate better QOL).  Pt identified 4 people she speaks with weekly, would like to improve word finding in each of these situations.     TODAY'S TREATMENT:  01/09/22: SLP introduced all compensatory measures (circumlocution, synonym, description, gestures, drawing) with pt and provided examples. Pt gave examples of 3 of the five  afterwards, demonstrating understanding. SLP also introduced Primary school teacher (SFA) with pt today and provided handout (see pt instuctions).   12/20/21: Initiated education and coaching on use of description strategy to improve effectiveness of her communication. SLP re-initiated home exercise program with emphasis on home tasks such as Primary school teacher.      PATIENT EDUCATION: Education details: see above in "today's treatment" Person educated: Patient Education method: Explanation, Demonstration, Verbal cues, and Handouts Education comprehension: verbalized understanding, returned demonstration, and needs further education     GOALS: Goals reviewed with patient? Yes   SHORT TERM GOALS: Target date: 01/19/2022   Pt will successfully utilize description strategy in structured language tasks, given occasional min-A in 80% of opportunities over 2 sessions.  Baseline: not using description strategy  Goal status: Ongoing   2.  Pt will name 10 items in x3 personally relevant categories over 2 sessions with occasional min-A.  Baseline: x4 items during divergent naming task Goal status: Ongoing   3.  Pt will identify x2 opportunities to speak to people outside of typically talked to family members, and have brief conversational exchange, self-reporting a score of 2/5 for communication effectiveness.  Baseline: 0/5 self-rating for efficacy when talking to people outside of immediate family members and best friend  Goal status: Ongoing     LONG TERM GOALS: Target date: 02/18/2022   Pt will successfully utilize description strategy or other strategies in 15 minute moderately complex conversational exchange in 80% of opportunities  Baseline: not using description strategy Goal status: Ongoing   2.  Pt will improve her score on the Communication Participation Item Bank short form by 5 points  Baseline: initial score of 1 Goal status: Ongoing   3.  Pt will report carryover  of anomia compensations, resulting in subjective feeling if improved communication effectiveness while out in community, over 1 week period.  Baseline: pt is not talking while out in community Goal status: Ongoing     ASSESSMENT:   CLINICAL IMPRESSION: Patient is a 46 y.o. female who was seen today primarily for aphasia, in presence of baseline of aphasia, foreign accent disorder and dysarthria, following multiple strokes - latest 12-11-21. SLP assisted Marvis with SFA as well as introducing pt to all compensatory strategies for anomia. She indicates her anomia appears to be improving since last visit, with cues and assistance from family to take her time and slow her rate. Sherylann states she talks to family, rarely and her best friend semi-daily. Today in conversation pt only used circumlocution and synonym strategies, rarely. Extra time was pt's most-used and best strategy. Pt demonstrates mild-mod expressive aphasia which she reports decreases her QOL especially in the community, but also with family members - she reports the only semi-regular conversation she has is with her friend [place]. Johnica tells SLP that she feels like more complex topics are very difficult for her because her anomia increases in frequency and then she cannot use strategies as successfully due to "getting flustered". Pt's mild-mod dysarthria is compensated for well, when pt is asked. Overall intelligibility is close to 95%. Due to limited visits pt's therapy goals were developed only with pt's desired area of  improvement - anomia. Pt in agreement with POC.    OBJECTIVE IMPAIRMENTS include expressive language, aphasia, and dysarthria. These impairments are limiting patient from return to work and effectively communicating at home and in community. Factors affecting potential to achieve goals and functional outcome are previous level of function, financial resources, and family/community support. Patient will benefit from  skilled SLP services to address above impairments and improve overall function.   REHAB POTENTIAL: Fair limited visits available   PLAN: SLP FREQUENCY: 1x/week   SLP DURATION: 8 weeks   PLANNED INTERVENTIONS: Language facilitation, Cueing hierachy, Internal/external aids, Functional tasks, Multimodal communication approach, SLP instruction and feedback, Compensatory strategies, and Patient/family education   [facility], CCC-SLP 01/09/2022, 11:44 AM

## 2022-01-09 NOTE — Therapy (Signed)
OUTPATIENT PHYSICAL THERAPY NEURO DISCHARGE SUMMARY   [name] MRN: [id number] DOB:Oct 11, 1975, 46 y.o., female Today's Date: 01/09/2022   PCP: Riesa Pope, MD REFERRING PROVIDER: [name], MD   PT End of Session - 01/09/22 1237     Visit Number 2    Number of Visits 2    Date for PT Re-Evaluation 01/10/22    Authorization Type UHC Medicaid - pt has 9 visits remaining for 2023 for combined disciplines    PT Start Time 1013    PT Stop Time 1100    PT Time Calculation (min) 47 min    Equipment Utilized During Treatment Gait belt    Activity Tolerance Patient tolerated treatment well    Behavior During Therapy [facility] for tasks assessed/performed              Past Medical History:  Diagnosis Date   Acute deep vein thrombosis of right iliac vein ([place]) 09/14/2020   Stroke [facility])    Past Surgical History:  Procedure Laterality Date   BUBBLE STUDY  01/29/2020   Procedure: BUBBLE STUDY;  Surgeon: [name], MD;  Location: [place];  Service: Cardiovascular;;   TEE WITHOUT CARDIOVERSION N/A 01/29/2020   Procedure: TRANSESOPHAGEAL ECHOCARDIOGRAM (TEE);  Surgeon: [name], MD;  Location: [facility] ENDOSCOPY;  Service: Cardiovascular;  Laterality: N/A;   Patient Active Problem List   Diagnosis Date Noted   Acute ischemic right MCA stroke ([place]) 12/10/2021   GERD (gastroesophageal reflux disease) 07/20/2021   Right shoulder pain 05/23/2021   Healthcare maintenance 03/07/2021   History of DVT (deep vein thrombosis) 10/06/2020   Family history of premature coronary heart disease 09/14/2020   OSA (obstructive sleep apnea) 09/14/2020   Pre-diabetes 02/03/2020   Subclinical hypothyroidism 02/03/2020   Tobacco use disorder 01/27/2020   Hyperlipemia 01/27/2020   Obesity 01/27/2020   History of CVA with residual deficit 01/26/2020   Extrinsic asthma 10/09/2004    ONSET DATE: 12/09/21  REFERRING DIAG: I63.511 (ICD-10-CM) -  Acute ischemic right MCA stroke (HCC) I69.30 (ICD-10-CM) - History of CVA with residual deficit  THERAPY DIAG:  Muscle weakness (generalized)  Unsteadiness on feet  Rationale for Evaluation and Treatment Rehabilitation  SUBJECTIVE:                                                                                                                                                                                              SUBJECTIVE STATEMENT: Son is about to come home and is excited. Forgot to bring her cane. Has been checking BP at home and it has been good. Walking about 7 minutes  without fatigue.  Pt accompanied by: self  PERTINENT HISTORY: DVT 09/2020 on eliquis, financial strain, left PICA CVA, left medial cerebellar and frontal/subcortical CVAs, left MCA occlusion w/ hemiparesis (01/27/2020), smoking, right occipital CVA w/ concurrent right superior cerebellar artery infarct 09/2021, cocaine use, hyperlipidemia  PAIN:  Are you having pain? Yes: NPRS scale: 2/10 Pain location: forehead Pain description: headache Aggravating factors: n/a Relieving factors: n/a  PRECAUTIONS: Fall   PATIENT GOALS to work on walking more and working on tiredness   OBJECTIVE:     TODAY'S TREATMENT: 01/01/22 Activity Comments  Gait training with SPC and quad tip cane and without AD 155f each Cueing to increase L step length; mild instability with SPC but good stability without AD and with quad tip cane   STS without Ues with R foot back 5x, with blue medball 5x Cues for "belly button forward"  Romberg 30" Difficulty placing feet into romberg; good stability once in romberg  Stepping out, out, in, in with walker in front   standing with EC 30", in romberg EC 30" C/o difficulty in romberg  alt toe tap on cone  Instability requiring light UE support on walker  L/R LAQ with red and green TB  10x each Provided green loop for increased challenge at home   Gait training outside on sidewalk, grass, hills  with quad tip cane x [id number] standing rest breaks d/t fatigue; cueing to increase L step length     HOME EXERCISE PROGRAM Last updated: 01/09/22 Access Code: [email or web address] URL: [email or web address] Date: 01/09/2022 [facility]ared by: [facility]  Exercises - Sit to Stand with Arms Crossed  - 1 x daily - 5 x weekly - 2 sets - 10 reps - Narrow Stance with Counter Support  - 1 x daily - 5 x weekly - 2 sets - 10 reps - Standing Near Stance in Corner with Eyes Closed  - 1 x daily - 5 x weekly - 2-3 sets - 30 sec hold - Standing Toe Taps  - 1 x daily - 5 x weekly - 2 sets - 10 reps - Sitting Knee Extension with Resistance  - 1 x daily - 5 x weekly - 2 sets - 10 reps   PATIENT EDUCATION: Education details: edu on quad tip attachment for cane, edu on ambulating with quad tip cane outside and to MD appointments, HEP update Person educated: Patient Education method: Explanation, Demonstration, Tactile cues, Verbal cues, and Handouts Education comprehension: verbalized understanding and returned demonstration    Below measures were taken at time of initial evaluation unless otherwise specified:   DIAGNOSTIC FINDINGS:  12/10/21 brain MRI: Small acute cortical infarcts scattered in the right MCA distribution.  Multiple remote infarcts with progression from 2021, especially in the superior right cerebellum.  COGNITION: Overall cognitive status: No family/caregiver present to determine baseline cognitive functioning   SENSATION: B WFL with light touch testing but patient noting diminished sensation L UE/LE  COORDINATION: Alt toe tap intact B; alt pronation/supination with bradykinesia and dysmetria B Ues   EDEMA:  none  POSTURE: No Significant postural limitations  LOWER EXTREMITY ROM:     Active  Right Eval Left Eval  Hip flexion    Hip extension    Hip abduction    Hip adduction    Hip internal rotation    Hip external rotation     Knee flexion    Knee extension    Ankle dorsiflexion 1 8  Ankle plantarflexion  Ankle inversion    Ankle eversion     (Blank rows = not tested)  LOWER EXTREMITY MMT:    MMT (in sitting) Right Eval Left Eval  Hip flexion 4- 4+  Hip extension    Hip abduction 4+ 4+  Hip adduction 4 4  Hip internal rotation    Hip external rotation    Knee flexion 4+ 4+  Knee extension 4+ 4  Ankle dorsiflexion 4+ 4+  Ankle plantarflexion 4+ 4+  Ankle inversion    Ankle eversion    (Blank rows = not tested)  GAIT: Gait pattern:  Antalgia on L LE with L knee genu recurvatum in stance  Assistive device utilized: Environmental consultant - 2 wheeled Level of assistance: SBA      PATIENT EDUCATION: Education details: advised to use 4WW when walking outside for fitness as patient currently is using cane; prognosis, POC, HEP- with edu on safety Person educated: Patient Education method: Explanation, Demonstration, Tactile cues, Verbal cues, and Handouts Education comprehension: verbalized understanding and returned demonstration   HOME EXERCISE PROGRAM: Access Code: [email or web address] URL: [email or web address] Date: 12/20/2021 Prepared by: [facility]  Exercises - Sit to Stand with Arms Crossed  - 1 x daily - 5 x weekly - 2 sets - 10 reps - Narrow Stance with Counter Support  - 1 x daily - 5 x weekly - 2 sets - 10 reps - Standing Balance in Corner with Eyes Closed  - 1 x daily - 5 x weekly - 2 sets - 10 reps - Standing Toe Taps  - 1 x daily - 5 x weekly - 2 sets - 10 reps - Sitting Knee Extension with Resistance  - 1 x daily - 5 x weekly - 2 sets - 10 reps    GOALS: Goals reviewed with patient? Yes  SHORT TERM GOALS: Target date: 01/03/2022  Patient to be independent with initial HEP. Baseline: HEP initiated Goal status: MET    LONG TERM GOALS: Target date: 01/10/2022  Patient to be independent with advanced HEP. Baseline: Not yet initiated  Goal  status: MET   Patient to report compliance with walking program with son or CNA supervision and [phone] for safety.   Baseline: currently ambulating with SPC outside  Goal status: MET    ASSESSMENT:  CLINICAL IMPRESSION: Patient arrived to session without new complaints. Worked on gait training with cane and without AD; patient with good stability without AD and with quad tip cane but required cues to correct gait deviations. Worked on walking on different surfaces with patient's main limitation being fatigue rather than imbalance. Reviewed HEP and provided increased challenges to address remaining deficits. Patient reported understanding of all edu provided and without complaints at end of session. Patient is ready for D/C at this time per her request/insurance limitations.      OBJECTIVE IMPAIRMENTS Abnormal gait, decreased balance, decreased coordination, decreased endurance, decreased knowledge of use of DME, difficulty walking, decreased ROM, decreased strength, decreased safety awareness, impaired flexibility, and improper body mechanics.   ACTIVITY LIMITATIONS carrying, lifting, bending, standing, squatting, stairs, bathing, dressing, reach over head, and locomotion level  PARTICIPATION LIMITATIONS: meal prep, shopping, community activity, and church  PERSONAL FACTORS Age, Past/current experiences, Time since onset of injury/illness/exacerbation, and 3+ comorbidities: DVT 09/2020 on eliquis, financial strain, left PICA CVA, left medial cerebellar and frontal/subcortical CVAs, left MCA occlusion w/ hemiparesis (01/27/2020), smoking, right occipital CVA w/ concurrent right superior cerebellar artery infarct 09/2021, cocaine use, hyperlipidemia  are also affecting patient's functional outcome.   REHAB POTENTIAL: Good  CLINICAL DECISION MAKING: Evolving/moderate complexity  EVALUATION COMPLEXITY: Moderate  PLAN: PT FREQUENCY:  2 visits  PT DURATION: 3 weeks  PLANNED INTERVENTIONS:  Therapeutic exercises, Therapeutic activity, Neuromuscular re-education, Balance training, Gait training, Patient/Family education, Self Care, Joint mobilization, Stair training, Vestibular training, Canalith repositioning, DME instructions, Aquatic Therapy, Dry Needling, Electrical stimulation, Cryotherapy, Moist heat, Taping, Manual therapy, and Re-evaluation  PLAN FOR NEXT SESSION: DC at this time     PHYSICAL THERAPY DISCHARGE SUMMARY  Visits from Start of Care: 2  Current functional level related to goals / functional outcomes: See above clinical impression   Remaining deficits: Gait deviations, weakness   Education / Equipment: HEP  Plan: Patient agrees to discharge.  Patient goals were met. Patient is being discharged due to her request/insurance limitations.      Janene Harvey, PT, DPT 01/09/22 12:45 PM  [facility] at [facility] [address] [place], [place], [place] [address] Phone # [phone] Fax # [phone]

## 2022-01-11 DIAGNOSIS — I6930 Unspecified sequelae of cerebral infarction: Secondary | ICD-10-CM | POA: Diagnosis not present

## 2022-01-12 DIAGNOSIS — I6930 Unspecified sequelae of cerebral infarction: Secondary | ICD-10-CM | POA: Diagnosis not present

## 2022-01-13 ENCOUNTER — Other Ambulatory Visit: Payer: Self-pay | Admitting: Student

## 2022-01-15 ENCOUNTER — Telehealth: Payer: Self-pay

## 2022-01-15 NOTE — Telephone Encounter (Signed)
Please call pt back for incontinence supply.  

## 2022-01-15 NOTE — Progress Notes (Unsigned)
[facility] [address] [place]. [place] [address] [phone]       OFFICE FOLLOW UP VISIT NOTE  Maria Porter Date of Birth:  [birth year] Medical Record Number:  [id number]   Referring MD: Maria Porter  Reason for Referral: Stroke    No chief complaint on file.       HPI:   Update 01/16/2022 JM: Patient returns for sooner scheduled follow-up visit due to recent stroke.  She presented to ED on 8/5 for acute onset dysarthria with stroke work-up revealing small, acute cortical infarcts in right MCA concerning for undetermined thrombophilia.  Of note, Maria Porter discontinued 7/24 as repeat LE ultrasound did not show any evidence of residual or new DVT and otherwise no clear indication for continued therapy (prior hypercoagulable labs negative, loop recorder without evidence of A-fib and TEE negative for PFO).  In setting of recurrent stroke off Maria Porter, it was recommended to restart in addition to aspirin and continue lifelong for suspected underlying unspecified thrombophilia.  CTA head/neck unremarkable.  EF 60 to 65%.  Loop recorder interrogated which was negative for A-fib.  LDL 61.  A1c 6.2.  UDS negative.  Continued tobacco use with cessation counseling provided.  Therapies recommended outpatient therapy.         History provided for reference purposes only Update 11/14/2021 JM: patient returns for 6 month follow up unaccompanied.   Presented to ED in [place] on 4/29 for evaluation of acute onset left-sided numbness and slurred speech, MRI brain showed large acute/subacute right cerebellar infarct, CTA R SCA occlusion. TTE no evidence of thrombus. UDS + cocaine.  Had loop recorder placed. D/c'd 5/4 to inpatient rehab and d/c'd home on 5/16.   Since discharge, she has since moved back in her own apartment, she was initially staying with her daughter in [place]. Reports continued left sided numbness and worsening baseline right sided weakness  and speech difficulty. Has initial evaluation with PT/OT on 7/19. Use of RW for ambulation. Does maintain ADLs and majority of IADLs independently but does have some difficulty. She does not drive. Daughter checks on her routinely. Denies any additional stroke/TIA symptoms since discharge.   Denies any cocaine use despite +UDS back in [place].   Was started on Maria Porter 5 mg twice daily in addition to aspirin by PCP due to recent stroke that occurred 1 month after d/c'ing Maria Porter for DVT. Repeat LE ultrasound not completed prior to stopping [facility] nor during April admission. Denies any SE on Maria Porter. Compliant on atorvastatin, denies side effects.  Blood pressure today 124/81, monitors at home and typically stable. Completed sleep study which did not show any significant sleep apnea.   No further concerns at this time   Update 05/16/2021 Maria Porter: She returns for follow-up after last visit 6 months ago.  She states she is doing well and has not had any recurrent stroke or TIA symptoms.  She is participating in outpatient physical occupational and has recently started speech therapy which seems to be helping her.  She is met most of her goals.  She still has a different Maria Porter accent which has not resolved.  She can walk short distances indoors without a cane but uses a cane for long distances.  She remains on Maria Porter for DVT which she is tolerating well without bruising or bleeding.  Her blood pressure is under good control today it is 116/84.  She is tolerating Lipitor well without muscle aches and pains.  She continues to smoke but is  trying to quit and primary physician started her on NicoDerm patch.  She is started eating healthy and is losing some weight.  She did not make an appointment to see Dr. Brett Porter or Maria Porter whom I referred her to at last visit for sleep apnea but is now willing to do so.  She continues to have mild expressive aphasia and spastic right hemiplegia which is unchanged.  She now wants to  drive.   Initial visit 11/01/2020 Dr. Leonie Porter Maria Porter is a 46 year old African-American lady seen today for initial office consultation visit for stroke.  History is obtained from the patient and review of electronic medical records as well as Maria Porter and I personally reviewed pertinent imaging films in PACS.  She has past medical history of remote left frontal stroke, obesity, hyperlipidemia who presented initially on 01/26/2020 with sudden onset aphasia and right hemiparesis while at home.  CT scan of the head on admission in the ED showed old left frontal infarct but CT angiogram was negative for LVO and CT perfusion showed no mismatch.  Symptoms are improving initial NIH stroke scale was 9 and improved to 3.  tPA was not administered due to rapidly improving symptoms.  MRI scan showed left frontal cortical and subcortical infarct adjacent to the old infarct.  MRI also showed old bilateral frontal and left inferior cerebellar infarcts of remote age.  MRA of the brain and neck showed no significant large vessel stenosis or occlusion.  EEG showed no seizure activity.  2D echo showed ejection fraction of 55 to 60%.  TCD bubble studies was positive with a few hits noted only after Valsalva history of her foot reveals clinically insignificant PFO.  TEE also showed a small right-to-left shunt but no cardiac source of embolism.  Lower extremity Dopplers were negative for DVT.  Spinal tap was obtained which showed WBC 3, red cells 10, glucose 72 and protein 44 mg percent and no evidence of infection.  LDL cholesterol is 125 mg percent and hemoglobin A1c was 5.8.  Hypercoagulable panel labs are negative except for slightly elevated anticardiolipin IgM antibody of 14.  ESR and C-reactive protein B12 are normal.  Patient was started on aspirin and Plavix for 3 weeks followed by Plavix alone.  Tested positive for sleep apnea on the NOx 3 monitor and participated in the sleep smart study and was randomized to the  medical treatment.  Patient states she is she was doing well after that but while in [place] she developed again sudden onset of aphasia and right hemiparesis and she was admitted to [facility]  on 09/16/2020 with NIH stroke scale 19.  She received IV tPA and CT angiogram showed left M1 occlusion she underwent mechanical thrombectomy which was successful after second pass with TICI 2b revascularization.  2D echo and TEE were repeated which were unremarkable.  Lower extremity venous Dopplers on 09/13/2020 showed acute DVT in the right external iliac vein.  She was started on Maria Porter since then.  She states she started Maria Porter well without bruising or bleeding.  She states aphasia is improving though she still has significant hesitancy and struggles to speak and has to speak slowly.  Her comprehension is quite good.  Right-sided strength is improving though she has still weakness in the right grip and intrinsic hand muscles.  She is able to ambulate independently.  Patient has moved recently to [place] and is now living with her sister.  She has had normal recurrent stroke or TIA  symptoms.        ROS:   14 system review of systems is positive for those listed in HPI and all other systems negative    PMH:  Past Medical History:  Diagnosis Date   Acute deep vein thrombosis of right iliac vein (HCC) 09/14/2020   Stroke [facility])     Social History:  Social History   Socioeconomic History   Marital status: Single    Spouse name: Not on file   Number of children: Not on file   Years of education: Not on file   Highest education level: Not on file  Occupational History   Not on file  Tobacco Use   Smoking status: Every Day    Packs/day: 0.30    Types: Cigarettes    Start date: 03/07/2020    Last attempt to quit: 08/30/2021    Years since quitting: 0.3   Smokeless tobacco: Never   Tobacco comments:    1 pk per day.   Stopped x 1 month age    12/04/2021  patient smokes 4 cigarettes daily  Substance and Sexual Activity   Alcohol use: Yes   Drug use: Not on file   Sexual activity: Not on file  Other Topics Concern   Not on file  Social History Narrative   Lives with sister   Left Handed   Drinks 2-3 cups caffeine daily   Social Determinants of Health   Financial Resource Strain: High Risk (11/15/2021)   Overall Financial Resource Strain (CARDIA)    Difficulty of Paying Living Expenses: Very hard  Food Insecurity: Food Insecurity Present (11/15/2021)   Hunger Vital Sign    Worried About Charity fundraiser in the Last Year: Often true    Ran Out of Food in the Last Year: Often true  Transportation Needs: Not on file  Physical Activity: Not on file  Stress: Not on file  Social Connections: Not on file  Intimate Partner Violence: Not on file    Medications:   Current Outpatient Medications on File Prior to Visit  Medication Sig Dispense Refill   acetaminophen (TYLENOL) 325 MG tablet Take 2 tablets (650 mg total) by mouth every 6 (six) hours as needed for mild pain (or Fever >/= 101). 30 tablet 2   albuterol (PROVENTIL) (2.5 MG/3ML) 0.083% nebulizer solution Take 3 mLs (2.5 mg total) by nebulization every 4 (four) hours as needed for wheezing or shortness of breath. 75 mL 2   apixaban (Maria Porter) 5 MG TABS tablet Take 1 tablet (5 mg total) by mouth 2 (two) times daily. 60 tablet 2   aspirin EC 81 MG tablet Take 1 tablet (81 mg total) by mouth daily. Swallow whole. (Patient taking differently: Take 81 mg by mouth daily.) 30 tablet 11   atorvastatin (LIPITOR) 80 MG tablet Take 1 tablet (80 mg total) by mouth daily. 90 tablet 1   cetirizine (ZYRTEC) 10 MG tablet Take 10 mg by mouth daily.     ezetimibe (ZETIA) 10 MG tablet Take 1 tablet (10 mg total) by mouth daily. 90 tablet 3   fluticasone (FLONASE) 50 MCG/ACT nasal spray Place 1 spray into both nostrils daily.     levothyroxine (SYNTHROID) 175 MCG tablet Take 1 tablet (175 mcg total) by  mouth daily at 6 (six) AM. 30 tablet 2   nicotine (NICODERM CQ - DOSED IN MG/24 HR) 7 mg/24hr patch Place 1 patch (7 mg total) onto the skin daily. 30 patch 1   ondansetron (ZOFRAN) 4  MG tablet Take 1 tablet (4 mg total) by mouth every 8 (eight) hours as needed for nausea or vomiting. 20 tablet 0   pantoprazole (PROTONIX) 40 MG tablet TAKE 1 TABLET BY MOUTH EVERY DAY 30 tablet 1   polyethylene glycol (MIRALAX / GLYCOLAX) 17 g packet Take 17 g by mouth daily as needed for mild constipation or moderate constipation.     VENTOLIN HFA 108 (90 Base) MCG/ACT inhaler INHALE 1-2 PUFFS BY MOUTH EVERY 6 HOURS AS NEEDED FOR WHEEZE OR SHORTNESS OF BREATH (Patient taking differently: Inhale 1-2 puffs into the lungs every 6 (six) hours as needed for wheezing or shortness of breath.) 18 each 3   No current facility-administered medications on file prior to visit.    Allergies:  No Known Allergies  Physical Exam There were no vitals filed for this visit.  There is no height or weight on file to calculate BMI.   General: Obese very pleasant middle-aged African-American lady, seated, in no evident distress Head: head normocephalic and atraumatic.   Neck: supple with no carotid or supraclavicular bruits Cardiovascular: regular rate and rhythm, no murmurs Musculoskeletal: no deformity Skin:  no rash/petichiae Vascular:  Normal pulses all extremities  Neurologic Exam Mental Status: Awake and fully alert. Oriented to place and time. Recent and remote memory intact. Attention span, concentration and fund of knowledge appropriate. Mood and affect appropriate.  Mild expressive aphasia with nonfluent speech and some word finding difficulties.  Mild dysarthria noted. Able to name repeat and comprehend well. Cranial Nerves: Pupils equal, briskly reactive to light. Extraocular movements full without nystagmus. Visual fields full to confrontation. Hearing intact.  Moderate right lower facial weakness.  Sensation  intact. Face, tongue, palate moves normally and symmetrically.  Motor: Normal strength on the left side..  RUE: 4-/5 proximal and 3/5 distal although strength tested limited due to limited mobility with increased spasticity.  RLE: 4/5 throughout with slightly increased tone Sensory: decreased left upper and lower extremity compared to right side Coordination: Rapid alternating movements normal on left side. Finger-to-nose and heel-to-shin performed accurately on left side. Gait and Station: Arises from chair with mild difficulty. Stance is normal. Gait demonstrates mild spastic ataxic gait with partial right foot drop and uses a RW.  Tandem walking heel toe not attempted Reflexes: 2+ and asymmetric and brisker on the right. Toes downgoing.       ASSESSMENT/PLAN: 47 year old lady with multiple recurrent strokes (left frontal MCA branch infarct 01/2020, L MCA stroke 09/2020 s/p thrombectomy, right cerebellar stroke 08/2021 s/p ILR and right MCA infarcts 12/2021 concerning for undetermined thrombophilia) with resultant right spastic hemiparesis, speech/language impairment and left-sided numbness.  Vascular risk factors DVT 09/2020 tx'd with Maria Porter which was d/c'd 1 month prior to most recent stroke, HTN, HLD and obesity. Recent sleep study negative for apnea. Recent admin UDS +cocaine although pt declines use.     Recurrent strokes -Concern of underlying undetermined thrombophilia and now on lifelong DOAC as recurrent strokes in April occurred 1 month being off Maria Porter and most recently in August 2 weeks being off Maria Porter.  Repeat hypercoagulable labs negative.  Repeat LE Doppler back in July negative.  TEE negative for PFO.  Loop recorder has not shown atrial fibrillation thus far. -Continue Maria Porter 5 mg twice daily, aspirin 81 mg daily and atorvastatin for secondary stroke prevention measures -Start PT/OT next week as scheduled, discussed typical recovery time and use of AD at all times unless  otherwise instructed -Ensure close PCP follow-up for aggressive stroke  risk factor management including BP goal<130/90 and HLD with LDL goal<70 -Stroke labs 12/2021: LDL 61, A1c 6.2    Follow up in 4 months or call earlier if needed   CC:  [facility] provider: Dr. Brain Hilts, Vasilios, MD    I spent 46 minutes of face-to-face and non-face-to-face time with patient.  This included previsit chart review including review of recent hospitalization, lab review, study review, order entry, electronic health record documentation, patient education and discussion regarding recurrent strokes including possible etiology and further evaluation, residual deficits and typical recovery time, secondary stroke prevention measures and aggressive stroke risk factor management and answered all questions to patient's satisfaction  Frann Rider, AGNP-BC  [facility] [address] [place], [place] [address]  Phone [phone] Fax [phone] Note: This document was prepared with digital dictation and possible smart phrase technology. Any transcriptional errors that result from this process are unintentional.

## 2022-01-16 ENCOUNTER — Ambulatory Visit: Payer: Medicaid Other | Admitting: Adult Health

## 2022-01-16 ENCOUNTER — Encounter: Payer: Self-pay | Admitting: Adult Health

## 2022-01-16 VITALS — BP 138/91 | HR 92 | Wt 231.1 lb

## 2022-01-16 DIAGNOSIS — G8191 Hemiplegia, unspecified affecting right dominant side: Secondary | ICD-10-CM | POA: Diagnosis not present

## 2022-01-16 DIAGNOSIS — I69328 Other speech and language deficits following cerebral infarction: Secondary | ICD-10-CM

## 2022-01-16 DIAGNOSIS — I639 Cerebral infarction, unspecified: Secondary | ICD-10-CM

## 2022-01-16 DIAGNOSIS — R29818 Other symptoms and signs involving the nervous system: Secondary | ICD-10-CM | POA: Diagnosis not present

## 2022-01-16 DIAGNOSIS — I6930 Unspecified sequelae of cerebral infarction: Secondary | ICD-10-CM | POA: Diagnosis not present

## 2022-01-16 DIAGNOSIS — Z09 Encounter for follow-up examination after completed treatment for conditions other than malignant neoplasm: Secondary | ICD-10-CM | POA: Diagnosis not present

## 2022-01-16 NOTE — Telephone Encounter (Signed)
I called patient back about Incontinence supplies and also pads for her bed. I have the office note demographics,and order sheet for the doctor to sign and I will fax to [facility], Kathryn Linarez C9/12/20232:17 PM

## 2022-01-16 NOTE — Patient Instructions (Signed)
Continue aspirin 81 mg daily and Eliquis   and atorvastatin and Zetia  for secondary stroke prevention  Continue to follow up with PCP regarding cholesterol and blood pressure management  Maintain strict control of hypertension with blood pressure goal below 130/90 and cholesterol with LDL cholesterol (bad cholesterol) goal below 70 mg/dL.   Signs of a Stroke? Follow the BEFAST method:  Balance Watch for a sudden loss of balance, trouble with coordination or vertigo Eyes Is there a sudden loss of vision in one or both eyes? Or double vision?  Face: Ask the person to smile. Does one side of the face droop or is it numb?  Arms: Ask the person to raise both arms. Does one arm drift downward? Is there weakness or numbness of a leg? Speech: Ask the person to repeat a simple phrase. Does the speech sound slurred/strange? Is the person confused ? Time: If you observe any of these signs, call 911.    Followup in the future with me in 4 months or call earlier if needed       Thank you for coming to see [place] at [facility]. I hope we have been able to provide you high quality care today.  You may receive a patient satisfaction survey over the next few weeks. We would appreciate your feedback and comments so that we may continue to improve ourselves and the health of our patients.

## 2022-01-17 ENCOUNTER — Ambulatory Visit: Payer: Medicaid Other | Admitting: Occupational Therapy

## 2022-01-17 ENCOUNTER — Encounter: Payer: Self-pay | Admitting: Occupational Therapy

## 2022-01-17 ENCOUNTER — Ambulatory Visit: Payer: Medicaid Other

## 2022-01-17 ENCOUNTER — Other Ambulatory Visit: Payer: Self-pay | Admitting: Student

## 2022-01-17 DIAGNOSIS — I6930 Unspecified sequelae of cerebral infarction: Secondary | ICD-10-CM | POA: Diagnosis not present

## 2022-01-17 DIAGNOSIS — R208 Other disturbances of skin sensation: Secondary | ICD-10-CM

## 2022-01-17 DIAGNOSIS — R471 Dysarthria and anarthria: Secondary | ICD-10-CM

## 2022-01-17 DIAGNOSIS — I69353 Hemiplegia and hemiparesis following cerebral infarction affecting right non-dominant side: Secondary | ICD-10-CM

## 2022-01-17 DIAGNOSIS — R4701 Aphasia: Secondary | ICD-10-CM | POA: Diagnosis not present

## 2022-01-17 NOTE — Therapy (Signed)
OUTPATIENT OCCUPATIONAL THERAPY NEURO EVALUATION  [name] MRN: [id number] DOB:[birth year], 46 y.o., female Today's Date: 01/17/2022  PCP: Belva Agee, MD REFERRING PROVIDER: Inez Catalina, MD   OT End of Session - 01/17/22 1151     Visit Number 1    Authorization Time Period VL: 27 combined            Past Medical History:  Diagnosis Date   Acute deep vein thrombosis of right iliac vein (HCC) 09/14/2020   Stroke [facility])    Past Surgical History:  Procedure Laterality Date   BUBBLE STUDY  01/29/2020   Procedure: BUBBLE STUDY;  Surgeon: Little Ishikawa, MD;  Location: [facility] ENDOSCOPY;  Service: Cardiovascular;;   TEE WITHOUT CARDIOVERSION N/A 01/29/2020   Procedure: TRANSESOPHAGEAL ECHOCARDIOGRAM (TEE);  Surgeon: Little Ishikawa, MD;  Location: [facility] ENDOSCOPY;  Service: Cardiovascular;  Laterality: N/A;   Patient Active Problem List   Diagnosis Date Noted   Acute ischemic right MCA stroke (HCC) 12/10/2021   GERD (gastroesophageal reflux disease) 07/20/2021   Right shoulder pain 05/23/2021   Healthcare maintenance 03/07/2021   History of DVT (deep vein thrombosis) 10/06/2020   Family history of premature coronary heart disease 09/14/2020   OSA (obstructive sleep apnea) 09/14/2020   Pre-diabetes 02/03/2020   Subclinical hypothyroidism 02/03/2020   Tobacco use disorder 01/27/2020   Hyperlipemia 01/27/2020   Obesity 01/27/2020   History of CVA with residual deficit 01/26/2020   Extrinsic asthma 10/09/2004    ONSET DATE: 12/11/2021 (date of OT order)  REFERRING DIAG: I63.511 (ICD-10-CM) - Acute ischemic right MCA stroke (HCC) I69.30 (ICD-10-CM) - History of CVA with residual deficit   THERAPY DIAG:  Hemiplegia and hemiparesis following cerebral infarction affecting right non-dominant side (HCC)  Other disturbances of skin sensation  Rationale for Evaluation and Treatment Rehabilitation  SUBJECTIVE:   SUBJECTIVE  STATEMENT: Pt arrives to OP OT evaluation w/ primary concern related to deficits s/p recurrent strokes w/ her most recent stroke on 12/09/21. Pt reports that before this most recent stroke she could use her right hand, but has had more difficulty since. Also states that she was able to lift her R arm past 90 deg about a week before mother's day 2023 and is not able to now. H/o strokes impacted RUE GMC > LUE and LUE/LLE sensation. Pt accompanied by: self  PERTINENT HISTORY: recurrent cryptogenic CVA; small cortical infarcts in R MCA distribution 12/09/21, R occipital CVA w/ concurrent R superior cerebellar artery infarct 09/2021, L PICA CVA, L medial cerebellar and frontal/subcortical CVAs, L MCA occlusion w/ hemiparesis 2021. Other significant PMH includes h/o R external iliac DVT 09/2020, HLT, prediabetes, tobacco use, and cocaine use.  PRECAUTIONS: Fall; no driving  WEIGHT BEARING RESTRICTIONS No  PAIN: Are you having pain? No  FALLS: Has patient fallen in last 6 months? No  LIVING ENVIRONMENT: Lives with: lives alone; daughter checks in every other weekend Lives in: House/apartment (ADA accessible apartment) Stairs: No Has following equipment at home: Counselling psychologist, Environmental consultant - 2 wheeled, and Environmental consultant - 4 wheeled  PLOF: Independent with basic ADLs and Needs assistance with homemaking  PATIENT GOALS: "Raise my hand up"   OBJECTIVE:   HAND DOMINANCE: Left  ADLs: Overall ADLs: Mod Ind w/ all BADLs, per pt report; has someone do her hair once a month; difficulty w/ clothing fasteners, cutting food, and opening medication bottles Equipment: Transfer tub bench, Grab bars, bed side commode, Reacher, Long handled sponge, and elevated toilet seat  IADLs:  Shopping: online Light housekeeping: daughter English as a second language teacher Meal Prep: completing cooking Community mobility: relies on transportation Medication management: Mod Ind Landscape architect: Independent Handwriting:  Reports, "not as  good as it used to be"  FUNCTIONAL OUTCOME MEASURES: Upper Extremity Functional Index: 54/80  UPPER EXTREMITY ROM     Active ROM Right Eval - 9/13  Shoulder flexion 19  Shoulder abduction 25  Shoulder adduction   Shoulder extension   Shoulder internal rotation   Shoulder external rotation 23  Elbow flexion 132  Elbow extension -4  Wrist flexion   Wrist extension   Wrist ulnar deviation   Wrist radial deviation   Wrist pronation   Wrist supination   (Blank rows = not tested) *Able to achieve 68 degrees of R shoulder flexion passively  HAND FUNCTION: Grip strength: Right: 15 lbs; Left: 42 lbs From previous OT eval 09/2021: Grip strength: Right: 20.9 lbs; Left 52 lbs  COORDINATION: 9 Hole Peg test: Right: 57.7 sec; Left: 22.8 sec From previous OT eval 09/2021: 9-HPT: Right: 79.31 sec; Left: 19.66 sec Box and Blocks: Right 19 blocks, Left 57 blocks  SENSATION: Impaired - reports numbness along L side and in LUE/L hand from a previous CVA  MUSCLE TONE: RUE hypertonicity  COGNITION: Overall cognitive status: Within functional limits for tasks assessed  VISION: Subjective report: no concerns at this time Baseline vision: Wears glasses for reading only Visual history:  no significant history  OBSERVATIONS: Aphasia   TODAY'S TREATMENT:  N/A - Eval only   PATIENT EDUCATION: Educated on role and purpose of OT as well as potential interventions and goals for therapy based on initial evaluation findings. Person educated: Patient Education method: Explanation Education comprehension: verbalized understanding   HOME EXERCISE PROGRAM: To be administered   GOALS: Goals reviewed with patient? No  SHORT TERM GOALS: Target date: 01/31/22    Status:  1 Pt will demonstrate independence w/ HEP targeting RUE shoulder ROM Baseline: 19 deg of AROM; 68 deg PROM Initial     LONG TERM GOALS: Target date: 02/14/22    Status:  1 Pt will increase range of motion in RUE  shoulder flexion by at least 10 degrees of PROM to progress towards ability to reach items at low level. Baseline: 19 deg AROM Initial  2 Pt will improve grip strength in R hand by at least 5 lbs for increased functional use during IADLs and when opening various containers  Baseline: Grip strength: Right: 15 lbs; Left: 42 lbs (Right: 20.9 lbs in May 2023) Initial    ASSESSMENT:  CLINICAL IMPRESSION: Pt is a 46 y/o who presents to OP OT due to residual deficits s/p recurrent cryptogenic strokes since 2021. PMH also includes  h/o R external iliac DVT 09/2020, HLT, prediabetes, tobacco use, and cocaine use. Pt currently lives alone in an ADA accessible apartment. Evaluation indicated deficits w/ RUE coordination, ROM, and strength, as well as limitations w/ functional mobility. Pt will benefit from skilled occupational therapy services to address deficits and for implementation of an HEP to improve participation, safety, and maximize functional use of RUE during ADLs and IADLs.   PERFORMANCE DEFICITS in functional skills including coordination, sensation, tone, ROM, strength, FMC, GMC, mobility, balance, body mechanics, and UE functional use.   IMPAIRMENTS are limiting patient from ADLs, IADLs, leisure, and social participation.   COMORBIDITIES may have co-morbidities  that affects occupational performance. Patient will benefit from skilled OT to address above impairments and improve overall function.  MODIFICATION OR ASSISTANCE TO COMPLETE  EVALUATION: Min-Moderate modification of tasks or assist with assess necessary to complete an evaluation.  OT OCCUPATIONAL PROFILE AND HISTORY: Problem focused assessment: Including review of records relating to presenting problem.  CLINICAL DECISION MAKING: Moderate - several treatment options, min-mod task modification necessary  REHAB POTENTIAL: Fair - recurrent CVA w/ residual deficits  EVALUATION COMPLEXITY: Moderate   PLAN: OT FREQUENCY:  1x/week  OT DURATION: 4 weeks (reduced frequency and duration due to insurance-based visit limit)  PLANNED INTERVENTIONS: self care/ADL training, therapeutic exercise, therapeutic activity, neuromuscular re-education, manual therapy, passive range of motion, functional mobility training, moist heat, cryotherapy, patient/family education, and DME and/or AE instructions  RECOMMENDED OTHER SERVICES: Recently completed PT; currently receiving ST  CONSULTED AND AGREED WITH PLAN OF CARE: Patient  PLAN FOR NEXT SESSION: review supine HEP and table slides for shoulder, RUE shoulder range of motion and gravity eliminated planes   Rosie Fate, OTR/L 01/17/2022, 12:30 PM

## 2022-01-17 NOTE — Therapy (Signed)
OUTPATIENT SPEECH LANGUAGE PATHOLOGY TREATMENT/Discharge summary   [name] MRN: [id number] DOB:09-11-1975, 46 y.o., female Today's Date: 01/17/2022  PCP: [name], MD (documentation) REFERRING PROVIDER: [name], MD   End of Session - 01/17/22 1226     Visit Number 3    Number of Visits 6    Date for SLP Re-Evaluation 02/18/22    Authorization Type [employer] - pt has 9 total visits remaining for 2023 - chose 5/9 ST    Authorization - Visit Number 2    Authorization - Number of Visits 5    SLP Start Time 1118    SLP Stop Time  1148    SLP Time Calculation (min) 30 min    Activity Tolerance Patient tolerated treatment well               Past Medical History:  Diagnosis Date   Acute deep vein thrombosis of right iliac vein ([name]) 09/14/2020   Stroke [name])    Past Surgical History:  Procedure Laterality Date   BUBBLE STUDY  01/29/2020   Procedure: BUBBLE STUDY;  Surgeon: [name], MD;  Location: [facility];  Service: Cardiovascular;;   TEE WITHOUT CARDIOVERSION N/A 01/29/2020   Procedure: TRANSESOPHAGEAL ECHOCARDIOGRAM (TEE);  Surgeon: [name], MD;  Location: [place];  Service: Cardiovascular;  Laterality: N/A;   Patient Active Problem List   Diagnosis Date Noted   Acute ischemic right MCA stroke ([place]) 12/10/2021   GERD (gastroesophageal reflux disease) 07/20/2021   Right shoulder pain 05/23/2021   Healthcare maintenance 03/07/2021   History of DVT (deep vein thrombosis) 10/06/2020   Family history of premature coronary heart disease 09/14/2020   OSA (obstructive sleep apnea) 09/14/2020   Pre-diabetes 02/03/2020   Subclinical hypothyroidism 02/03/2020   Tobacco use disorder 01/27/2020   Hyperlipemia 01/27/2020   Obesity 01/27/2020   History of CVA with residual deficit 01/26/2020   Extrinsic asthma 10/09/2004   SPEECH THERAPY DISCHARGE SUMMARY  Visits from Start of Care:  2  Current functional level related to goals / functional outcomes: See below.   Remaining deficits: Mild aphasia/WFL expressive language abilities, dysarthria including foreign accent disorder.   Education / Equipment: Compensations for aphasia  Patient agrees to discharge. Patient goals were partially met. Patient is being discharged due to being pleased with the current functional level..      ONSET DATE: referral date 12-11-21   REFERRING DIAG:  I63.511 (ICD-10-CM) - Acute ischemic right MCA stroke (HCC)  I69.30 (ICD-10-CM) - History of CVA with residual deficit    THERAPY DIAG:  Aphasia  Verbal apraxia  Dysarthria and anarthria  Rationale for Evaluation and Treatment Rehabilitation  SUBJECTIVE:   SUBJECTIVE STATEMENT: Pt feels she is at baseline and would like to cx remaining ST to use for either PT or OT. "You answered all (my questions) already for me." Pt accompanied by: self  PERTINENT HISTORY: 46 year old lady with multiple recurrent strokes (left frontal MCA branch infarct 01/2020, L MCA stroke 09/2020 s/p thrombectomy and most recent right cerebellar stroke 08/2021 s/p ILR) with resultant right spastic hemiparesis, speech/language impairment and left-sided numbness. Latest CVA 12/09/21, pt with SLE on 12/10/21 indicating mod-severe dysarthria, cognitive communication deficits, and aphasia.  PAIN:  Are you having pain? No  FALLS: Has patient fallen in last 6 months?  See PT evaluation for details  LIVING ENVIRONMENT: Lives with: lives with their family and lives alone Lives in: House/apartment  PLOF:  Level of assistance: Independent with ADLs  Employment: On disability   PATIENT GOALS Pt indicated she would like to engage more in conversations and feel more confident in finding the words she wants to use.  OBJECTIVE:   DIAGNOSTIC FINDINGS:  MR BRAIN - 12/10/21 IMPRESSION: 1. Small acute cortical infarcts scattered in the right MCA distribution. 2.  Multiple remote infarcts with progression from 2021, especially in the superior right cerebellum.      PATIENT REPORTED OUTCOME MEASURES (PROM): Communication Participation Item Bank: 1/30 (higher scores indicate better QOL).  Pt identified 4 people she speaks with weekly, would like to improve word finding in each of these situations.     TODAY'S TREATMENT:  01/17/22: SLP reminded pt what was done last session and the compensation measures discussed. SLP then facilitated pt using description strategy with pictures of less-common objects. Pt with 100% success with extra time (modified independent). Pt communicated that sometimes she is encouraged to speed up her speech rate due to more rapid talkers - [name] gave an example of a bank employee on the phone with her last week. She used the scripting that she had a CVA and she would need some extra time to convey her message. She finds people are usually very understanding with this. SLP then worked with pt for generating synonyms, pt 90% success independently. [name] filled out the Communication Participation Index, and scored herself 14/30, which is very different than 1/30 she rated herself initially at 1/30 (higher scores indicate greater QOL). She thanked SLP for working with her over the last few sessions.  01/09/22: SLP introduced all compensatory measures (circumlocution, synonym, description, gestures, drawing) with pt and provided examples. Pt gave examples of 3 of the five afterwards, demonstrating understanding. SLP also introduced Chartered loss adjuster (SFA) with pt today and provided handout (see pt instuctions).  12/20/21: Initiated education and coaching on use of description strategy to improve effectiveness of her communication. SLP re-initiated home exercise program with emphasis on home tasks such as Chartered loss adjuster.      PATIENT EDUCATION: Education details: see above in "today's treatment" Person educated:  Patient Education method: Explanation, Demonstration, Verbal cues, and Handouts Education comprehension: verbalized understanding, returned demonstration, and needs further education     GOALS: Goals reviewed with patient? Yes   SHORT TERM GOALS: Target date: 01/19/2022   Pt will successfully utilize description strategy in structured language tasks, given occasional min-A in 80% of opportunities over 2 sessions.        Baseline: not using description strategy 01/17/22       Goal status: Partially met   2.  Pt will name 10 items in x3 personally relevant categories over 2 sessions with occasional min-A.        Baseline: x4 items during divergent naming task       Goal status: Not met   3.  Pt will identify x2 opportunities to speak to people outside of typically talked to family members, and have brief conversational exchange, self-reporting a score of 2/5 for communication effectiveness.         Baseline: 0/5 self-rating for efficacy when talking to people outside of immediate family members and best friend         Goal status: Partially met (PROM taken 01-17-22)     LONG TERM GOALS: Target date: 02/18/2022   Pt will successfully utilize description strategy or other strategies in 15 minute moderately complex conversational exchange in 80% of opportunities  Baseline: not using description strategy Goal status: Not met  2.  Pt will improve her score on the Communication Participation Item Bank short form by 5 points  Baseline: initial score of 1 Goal status: Not met   3.  Pt will report carryover of anomia compensations, resulting in subjective feeling if improved communication effectiveness while out in community, over 1 week period.  Baseline: pt is not talking while out in community Goal status: Not met     ASSESSMENT:   CLINICAL IMPRESSION: Patient is a 46 y.o. female who was seen today primarily for aphasia, in presence of baseline of aphasia, foreign accent disorder and  dysarthria, following multiple strokes - latest 12-11-21. Today Aruna told SLP that her abilities are primarily at baseline and she requests d/c. SLP in agreement with pt in order to conserve visits for OT.   OBJECTIVE IMPAIRMENTS include expressive language, aphasia, and dysarthria. These impairments are limiting patient from return to work and effectively communicating at home and in community. Factors affecting potential to achieve goals and functional outcome are previous level of function, financial resources, and family/community support. Patient will benefit from skilled SLP services to address above impairments and improve overall function.   REHAB POTENTIAL: Fair limited visits available   PLAN: SLP FREQUENCY: 1x/week   SLP DURATION: 8 weeks   PLANNED INTERVENTIONS: Language facilitation, Cueing hierachy, Internal/external aids, Functional tasks, Multimodal communication approach, SLP instruction and feedback, Compensatory strategies, and Patient/family education   [facility], [place] 01/17/2022, 12:26 PM

## 2022-01-18 DIAGNOSIS — I6930 Unspecified sequelae of cerebral infarction: Secondary | ICD-10-CM | POA: Diagnosis not present

## 2022-01-19 DIAGNOSIS — I6930 Unspecified sequelae of cerebral infarction: Secondary | ICD-10-CM | POA: Diagnosis not present

## 2022-01-22 DIAGNOSIS — I6930 Unspecified sequelae of cerebral infarction: Secondary | ICD-10-CM | POA: Diagnosis not present

## 2022-01-23 ENCOUNTER — Other Ambulatory Visit: Payer: Self-pay

## 2022-01-23 DIAGNOSIS — I6930 Unspecified sequelae of cerebral infarction: Secondary | ICD-10-CM | POA: Diagnosis not present

## 2022-01-23 DIAGNOSIS — E7800 Pure hypercholesterolemia, unspecified: Secondary | ICD-10-CM

## 2022-01-23 MED ORDER — ATORVASTATIN CALCIUM 80 MG PO TABS: 80.0000 mg | ORAL_TABLET | Freq: Every day | ORAL | 1 refills | Status: DC

## 2022-01-23 NOTE — Telephone Encounter (Signed)
atorvastatin (LIPITOR) 80 MG tablet, refill request @ [id number] - [place], [place] [address].

## 2022-01-24 DIAGNOSIS — I6930 Unspecified sequelae of cerebral infarction: Secondary | ICD-10-CM | POA: Diagnosis not present

## 2022-01-24 MED ORDER — ATORVASTATIN CALCIUM 80 MG PO TABS: 80.0000 mg | ORAL_TABLET | Freq: Every day | ORAL | 1 refills | Status: DC

## 2022-01-24 NOTE — Addendum Note (Signed)
Addended by: Velora Heckler on: 01/24/2022 08:58 AM   Modules accepted: Orders

## 2022-01-24 NOTE — Progress Notes (Signed)
[facility] Summary Report / Loop Recorder 

## 2022-01-25 DIAGNOSIS — I6930 Unspecified sequelae of cerebral infarction: Secondary | ICD-10-CM | POA: Diagnosis not present

## 2022-01-26 DIAGNOSIS — I6930 Unspecified sequelae of cerebral infarction: Secondary | ICD-10-CM | POA: Diagnosis not present

## 2022-01-28 ENCOUNTER — Other Ambulatory Visit: Payer: Self-pay | Admitting: Student

## 2022-01-29 DIAGNOSIS — I6930 Unspecified sequelae of cerebral infarction: Secondary | ICD-10-CM | POA: Diagnosis not present

## 2022-01-30 DIAGNOSIS — I6930 Unspecified sequelae of cerebral infarction: Secondary | ICD-10-CM | POA: Diagnosis not present

## 2022-01-31 ENCOUNTER — Encounter: Payer: Self-pay | Admitting: Occupational Therapy

## 2022-01-31 ENCOUNTER — Ambulatory Visit: Payer: Medicaid Other | Admitting: Occupational Therapy

## 2022-01-31 DIAGNOSIS — R208 Other disturbances of skin sensation: Secondary | ICD-10-CM

## 2022-01-31 DIAGNOSIS — R4701 Aphasia: Secondary | ICD-10-CM | POA: Diagnosis not present

## 2022-01-31 DIAGNOSIS — I6930 Unspecified sequelae of cerebral infarction: Secondary | ICD-10-CM | POA: Diagnosis not present

## 2022-01-31 DIAGNOSIS — I69353 Hemiplegia and hemiparesis following cerebral infarction affecting right non-dominant side: Secondary | ICD-10-CM

## 2022-01-31 NOTE — Therapy (Addendum)
OUTPATIENT OCCUPATIONAL THERAPY TREATMENT NOTE  [name] MRN: [id number] DOB:1975-07-18, 46 y.o., female Today's Date: 01/31/2022  PCP: Riesa Pope, MD REFERRING PROVIDER: [name], MD  Patient did not return to therapy, see below for last known patient status    OT End of Session - 01/31/22 1101      Visit Number 2   Number of Visits 5   Date for OT Re-Evaluation 02/14/2022   Authorization Type [employer]    Authorization Time Period VL: 27 combined    Authorization - Visit Number 19   Authorization - Number of Visits 27   OT Start Time 1055   OT Stop Time 1136   OT Time Calculation (min) 41 min           Past Medical History:  Diagnosis Date   Acute deep vein thrombosis of right iliac vein ([place]) 09/14/2020   Stroke [facility])    Past Surgical History:  Procedure Laterality Date   BUBBLE STUDY  01/29/2020   Procedure: BUBBLE STUDY;  Surgeon: [name], MD;  Location: [place];  Service: Cardiovascular;;   TEE WITHOUT CARDIOVERSION N/A 01/29/2020   Procedure: TRANSESOPHAGEAL ECHOCARDIOGRAM (TEE);  Surgeon: [name], MD;  Location[facility] ENDOSCOPY;  Service: Cardiovascular;  Laterality: N/A;   Patient Active Problem List   Diagnosis Date Noted   Acute ischemic right MCA stroke ([place]) 12/10/2021   GERD (gastroesophageal reflux disease) 07/20/2021   Right shoulder pain 05/23/2021   Healthcare maintenance 03/07/2021   History of DVT (deep vein thrombosis) 10/06/2020   Family history of premature coronary heart disease 09/14/2020   OSA (obstructive sleep apnea) 09/14/2020   Pre-diabetes 02/03/2020   Subclinical hypothyroidism 02/03/2020   Tobacco use disorder 01/27/2020   Hyperlipemia 01/27/2020   Obesity 01/27/2020   History of CVA with residual deficit 01/26/2020   Extrinsic asthma 10/09/2004    ONSET DATE: 12/11/2021 (date of OT order)  REFERRING DIAG: I63.511 (ICD-10-CM) - Acute ischemic right MCA  stroke ([place]) I69.30 (ICD-10-CM) - History of CVA with residual deficit   THERAPY DIAG:  Hemiplegia and hemiparesis following cerebral infarction affecting right non-dominant side (HCC)  Other disturbances of skin sensation  Rationale for Evaluation and Treatment Rehabilitation  SUBJECTIVE:   SUBJECTIVE STATEMENT: Pt reports she has not tried exercises in supine before Pt accompanied by: self  PERTINENT HISTORY: recurrent cryptogenic CVA; small cortical infarcts in R MCA distribution 12/09/21, R occipital CVA w/ concurrent R superior cerebellar artery infarct 09/2021, L PICA CVA, L medial cerebellar and frontal/subcortical CVAs, L MCA occlusion w/ hemiparesis 2021. Other significant PMH includes h/o R external iliac DVT 09/2020, HLT, prediabetes, tobacco use, and cocaine use.  PRECAUTIONS: Fall; no driving  PAIN: Are you having pain? No  PLOF: Independent with basic ADLs and Needs assistance with homemaking  PATIENT GOALS: "Raise my hand up"   OBJECTIVE:   HAND DOMINANCE: Left  ADLs: Overall ADLs: Mod Ind w/ all BADLs, per pt report; has someone do her hair once a month; difficulty w/ clothing fasteners, cutting food, and opening medication bottles Equipment: Transfer tub bench, Grab bars, bed side commode, Reacher, Long handled sponge, and elevated toilet seat  IADLs: Shopping: online Light housekeeping: daughter completing laundry Meal Prep: completing cooking Community mobility: relies on transportation Medication management: Mod Ind Doctor, hospital: Independent Handwriting:  Reports, "not as good as it used to be"  FUNCTIONAL OUTCOME MEASURES: Upper Extremity Functional Index: 54/80  UPPER EXTREMITY ROM     Active ROM  Right Eval - 9/13  Shoulder flexion 19  Shoulder abduction 25  Shoulder adduction   Shoulder extension   Shoulder internal rotation   Shoulder external rotation 23  Elbow flexion 132  Elbow extension -4  Wrist flexion   Wrist extension    Wrist ulnar deviation   Wrist radial deviation   Wrist pronation   Wrist supination   (Blank rows = not tested) *Able to achieve 68 degrees of R shoulder flexion passively  HAND FUNCTION: Grip strength: Right: 15 lbs; Left: 42 lbs From previous OT eval 09/2021: Grip strength: Right: 20.9 lbs; Left 52 lbs  COORDINATION: 9 Hole Peg test: Right: 57.7 sec; Left: 22.8 sec From previous OT eval 09/2021: 9-HPT: Right: 79.31 sec; Left: 19.66 sec Box and Blocks: Right 19 blocks, Left 57 blocks  SENSATION: Impaired - reports numbness along L side and in LUE/L hand from a previous CVA   TODAY'S TREATMENT:  Therapeutic Exercise Supine chest press w/ cane completed 2x10 w/ OT providing occasional cues for consistent R elbow ext  Closed-chain mid-range shoulder flexion holding cane in supine completed 2x10  Seated scapular retraction AROM w/ OT providing tactile and verbal cues to decrease compensatory R shoulder elevation and emphasizing depression w/ retraction  Rolling large exercise ball back and forth w/ BUEs for shoulder flex/elbow ext to facilitate increased ROM and stretch 2x5 while sitting EOM; OT provided min verbal and tactile facilitation/cues to decrease compensatory shoulder elevation  NMR Functional reach w/ mirror in front of pt for visual cue to decrease compensatory shoulder hiking; able to achieve good activation of anterior deltoid w/ pt demonstration good self-correction    PATIENT EDUCATION: Ongoing condition-specific education related to therapeutic interventions completed this session Person educated: Patient Education method: Explanation Education comprehension: verbalized understanding   HOME EXERCISE PROGRAM: Access Code: [email or web address] URL: [email or web address]  Exercises - Supine Shoulder Press AAROM in Abduction with Dowel  - 2-3 x daily - 2 sets - 10 reps - Supine Shoulder Flexion AAROM with Hands Clasped  - 2-3 x daily - 2-3 sets - 5 reps -  Seated Scapular Retraction  - 2-3 x daily - 2 sets - 10 reps   GOALS: Goals reviewed with patient? Yes  SHORT TERM GOALS: Target date: 01/31/22    Status:  1 Pt will demonstrate independence w/ HEP targeting RUE shoulder ROM Baseline: 19 deg of AROM; 68 deg PROM Progressing     LONG TERM GOALS: Target date: 02/14/22    Status:  1 Pt will increase range of motion in RUE shoulder flexion by at least 10 degrees of PROM to progress towards ability to reach items at low level. Baseline: 19 deg AROM Progressing  2 Pt will improve grip strength in R hand by at least 5 lbs for increased functional use during IADLs and when opening various containers  Baseline: Grip strength: Right: 15 lbs; Left: 42 lbs (Right: 20.9 lbs in May 2023) Progressing    ASSESSMENT:  CLINICAL IMPRESSION: Pt arrives for first treatment session following initial evaluation on 01/17/22. OT reviewed goals w/ pt who is agreeable to plan of care at this time. OT facilitated R shoulder flexion exercises in supine to facilitate scapular alignment w/ retraction for improved stability and support during ROM and decreased compensatory patterns observed when attempting movement pattern while seated. Pt education on not pushing past point of pain and focusing on flexion to approx 100 degrees as able. Handout to include exercises in HEP provided. Pt responded well to cues to  push out vs lifting up when attempting functional reach w/ decreased compensatory shoulder hiking observed.   PERFORMANCE DEFICITS in functional skills including coordination, sensation, tone, ROM, strength, FMC, GMC, mobility, balance, body mechanics, and UE functional use.   IMPAIRMENTS are limiting patient from ADLs, IADLs, leisure, and social participation.   COMORBIDITIES may have co-morbidities  that affects occupational performance. Patient will benefit from skilled OT to address above impairments and improve overall function.   PLAN: OT FREQUENCY:  1x/week  OT DURATION: 4 weeks (reduced frequency and duration due to insurance-based visit limit)  PLANNED INTERVENTIONS: self care/ADL training, therapeutic exercise, therapeutic activity, neuromuscular re-education, manual therapy, passive range of motion, functional mobility training, moist heat, cryotherapy, patient/family education, and DME and/or AE instructions  RECOMMENDED OTHER SERVICES: Recently completed PT; currently receiving ST  CONSULTED AND AGREED WITH PLAN OF CARE: Patient  PLAN FOR NEXT SESSION: RUE shoulder range of motion and gravity eliminated planes   Kathrine Cords, OTR/L 01/31/2022, 10:58 AM

## 2022-02-01 DIAGNOSIS — I6930 Unspecified sequelae of cerebral infarction: Secondary | ICD-10-CM | POA: Diagnosis not present

## 2022-02-02 DIAGNOSIS — I6930 Unspecified sequelae of cerebral infarction: Secondary | ICD-10-CM | POA: Diagnosis not present

## 2022-02-05 ENCOUNTER — Other Ambulatory Visit: Payer: Self-pay | Admitting: Student

## 2022-02-05 DIAGNOSIS — I6930 Unspecified sequelae of cerebral infarction: Secondary | ICD-10-CM | POA: Diagnosis not present

## 2022-02-06 DIAGNOSIS — I6930 Unspecified sequelae of cerebral infarction: Secondary | ICD-10-CM | POA: Diagnosis not present

## 2022-02-07 ENCOUNTER — Ambulatory Visit (INDEPENDENT_AMBULATORY_CARE_PROVIDER_SITE_OTHER): Payer: Medicaid Other

## 2022-02-07 ENCOUNTER — Other Ambulatory Visit: Payer: Self-pay | Admitting: Student

## 2022-02-07 DIAGNOSIS — I639 Cerebral infarction, unspecified: Secondary | ICD-10-CM | POA: Diagnosis not present

## 2022-02-07 DIAGNOSIS — I6930 Unspecified sequelae of cerebral infarction: Secondary | ICD-10-CM | POA: Diagnosis not present

## 2022-02-08 DIAGNOSIS — I6930 Unspecified sequelae of cerebral infarction: Secondary | ICD-10-CM | POA: Diagnosis not present

## 2022-02-08 LAB — CUP PACEART REMOTE DEVICE CHECK
Date Time Interrogation Session: 20231004161538
Implantable Pulse Generator Implant Date: 20230501

## 2022-02-09 DIAGNOSIS — I6930 Unspecified sequelae of cerebral infarction: Secondary | ICD-10-CM | POA: Diagnosis not present

## 2022-02-13 NOTE — Telephone Encounter (Signed)
Pt called stated that she still has not received her supplies  .Marland Kitchen She is requesting a call back

## 2022-02-14 ENCOUNTER — Encounter: Payer: Self-pay | Admitting: Internal Medicine

## 2022-02-14 ENCOUNTER — Ambulatory Visit: Payer: Medicaid Other | Attending: Internal Medicine | Admitting: Internal Medicine

## 2022-02-14 VITALS — BP 126/79 | HR 103 | Ht 66.0 in | Wt 236.4 lb

## 2022-02-14 DIAGNOSIS — I639 Cerebral infarction, unspecified: Secondary | ICD-10-CM | POA: Diagnosis not present

## 2022-02-14 NOTE — Progress Notes (Signed)
Cardiology Office Note:    Date:  02/14/2022   ID:  Maria Porter, DOB 1976-02-23, MRN [id number]  PCP:  Riesa Pope, MD   [facility] Prov[facility] Cardiologist:  Janina Mayo, MD     Referring MD: Riesa Pope, *   No chief complaint on file. CVA  History of Present Illness:    Maria Porter is a 46 y.o. female with a hx of CVA left PICA vascular territory, left medial cerebellar, left frontal/subcortical strokes, left MCA occlusion with residual right-sided hemiparesis 01/27/2020 (TEE at that time showed + PFO but too small for repair) . She has hx of DVT on eliquis, smoking as well. In May, of this year patient was found to have acute right occipital infarct, brain MRI revealed a large acute/subacute right cerebellar infarct, CTA with occlusion to the right superior cerebellar artery. She was admitted to [facility] in [place].  She's had prior negative hypercoag studies. She had ILR placed in early May and referral was sent for FU.  Today, she denies palpitations. Her blood pressure is well controlled today.  She denies chest pain. She has asthma. She's on aspirin and eliquis. She is pending LE [place] for DVT. She has noted prior cocaine use. She has hx of negative sleep study.  Interim Hx 02/14/2022 In August, she presented to the hospital with dysarthria.  MRI brain on 8/6 showed small acute cortical infarcts in the right MCA distribution with multiple remote infarct progression since 2021. Bubble study showed no shunt. No gross vegetations. Hypercoag w/u is negative.  Today she has  right sided weakness and slurred speech. She walks with a walker. Continued on eliquis . Her ILR shows no Afib. No LE edema/PND/orthopnea. No CP/SOB.   Past Medical History:  Diagnosis Date   Acute deep vein thrombosis of right iliac vein ([place]) 09/14/2020   Stroke [facility])     Past Surgical History:  Procedure Laterality Date   BUBBLE STUDY  01/29/2020   Procedure: BUBBLE  STUDY;  Surgeon: [name], MD;  Location: [facility] ENDOSCOPY;  Service: Cardiovascular;;   TEE WITHOUT CARDIOVERSION N/A 01/29/2020   Procedure: TRANSESOPHAGEAL ECHOCARDIOGRAM (TEE);  Surgeon: [name], MD;  Location: [facility] ENDOSCOPY;  Service: Cardiovascular;  Laterality: N/A;    Current Medications: Current Outpatient Medications on File Prior to Visit  Medication Sig Dispense Refill   albuterol (PROVENTIL) (2.5 MG/3ML) 0.083% nebulizer solution Take 3 mLs (2.5 mg total) by nebulization every 4 (four) hours as needed for wheezing or shortness of breath. 75 mL 2   apixaban (ELIQUIS) 5 MG TABS tablet Take 1 tablet (5 mg total) by mouth 2 (two) times daily. 60 tablet 2   aspirin EC 81 MG tablet Take 1 tablet (81 mg total) by mouth daily. Swallow whole. (Patient taking differently: Take 81 mg by mouth daily.) 30 tablet 11   atorvastatin (LIPITOR) 80 MG tablet Take 1 tablet (80 mg total) by mouth daily. 90 tablet 1   cetirizine (ZYRTEC) 10 MG tablet TAKE 1 TABLET BY MOUTH EVERY DAY 30 tablet 2   [employer] ACETAMINOPHEN 325 MG tablet TAKE 2 TABLETS BY MOUTH EVERY 6 (SIX) HOURS AS NEEDED FOR MILD PAIN (OR FEVER >/= 101). 30 tablet 2   ezetimibe (ZETIA) 10 MG tablet Take 1 tablet (10 mg total) by mouth daily. 90 tablet 3   fluticasone (FLONASE) 50 MCG/ACT nasal spray SPRAY 1 SPRAY INTO BOTH NOSTRILS DAILY. 16 mL 2   levothyroxine (SYNTHROID) 175 MCG tablet TAKE 1 TABLET (175  MCG TOTAL) BY MOUTH DAILY AT 6 (SIX) AM. 90 tablet 2   nicotine (NICODERM CQ - DOSED IN MG/24 HR) 7 mg/24hr patch Place 1 patch (7 mg total) onto the skin daily. 30 patch 1   ondansetron (ZOFRAN) 4 MG tablet Take 1 tablet (4 mg total) by mouth every 8 (eight) hours as needed for nausea or vomiting. 20 tablet 0   pantoprazole (PROTONIX) 40 MG tablet TAKE 1 TABLET BY MOUTH EVERY DAY 90 tablet 1   polyethylene glycol (MIRALAX / GLYCOLAX) 17 g packet Take 17 g by mouth daily as needed for mild constipation or moderate  constipation.     VENTOLIN HFA 108 (90 Base) MCG/ACT inhaler INHALE 1-2 PUFFS BY MOUTH EVERY 6 HOURS AS NEEDED FOR WHEEZE OR SHORTNESS OF BREATH (Patient taking differently: Inhale 1-2 puffs into the lungs every 6 (six) hours as needed for wheezing or shortness of breath.) 18 each 3   No current facility-administered medications on file prior to visit.    Allergies:   Patient has no known allergies.   Social History   Socioeconomic History   Marital status: Single    Spouse name: Not on file   Number of children: Not on file   Years of education: Not on file   Highest education level: Not on file  Occupational History   Not on file  Tobacco Use   Smoking status: Every Day    Packs/day: 0.30    Types: Cigarettes    Start date: 03/07/2020    Last attempt to quit: 08/30/2021    Years since quitting: 0.4   Smokeless tobacco: Never   Tobacco comments:    1 pk per day.   Stopped x 1 month age    12/04/2021 patient smokes 4 cigarettes daily  Substance and Sexual Activity   Alcohol use: Yes   Drug use: Not on file   Sexual activity: Not on file  Other Topics Concern   Not on file  Social History Narrative   Lives with sister   Left Handed   Drinks 2-3 cups caffeine daily   Social Determinants of Health   Financial Resource Strain: High Risk (11/15/2021)   Overall Financial Resource Strain (CARDIA)    Difficulty of Paying Living Expenses: Very hard  Food Insecurity: Food Insecurity Present (11/15/2021)   Hunger Vital Sign    Worried About Running Out of Food in the Last Year: Often true    Ran Out of Food in the Last Year: Often true  Transportation Needs: Not on file  Physical Activity: Not on file  Stress: Not on file  Social Connections: Not on file     Family History: Mother and father with hx of heart disease as well as her maternal side  ROS:   Please see the history of present illness.     All other systems reviewed and are negative.  EKGs/Labs/Other Studies  Reviewed:    The following studies were reviewed today:   EKG:  EKG is  ordered today.  The ekg ordered today demonstrates   11/14/2021- NSR, inferior   Recent Labs: 12/09/2021: ALT 34 12/10/2021: TSH 17.280 12/11/2021: BUN 11; Creatinine, Ser 0.88; Hemoglobin 13.5; Platelets 360; Potassium 3.9; Sodium 137  Recent Lipid Panel    Component Value Date/Time   CHOL 125 12/10/2021 0954   TRIG 68 12/10/2021 0954   HDL 50 12/10/2021 0954   CHOLHDL 2.5 12/10/2021 0954   VLDL 14 12/10/2021 0954   LDLCALC 61 12/10/2021 0954  Risk Assessment/Calculations:           Physical Exam:    VS:   There were no vitals filed for this visit.    Wt Readings from Last 3 Encounters:  01/16/22 231 lb 2 oz (104.8 kg)  12/19/21 228 lb 8 oz (103.6 kg)  12/12/21 216 lb 7.9 oz (98.2 kg)     GEN:  Well nourished, well developed in no acute distress HEENT: Normal NECK: No JVD; No carotid bruits LYMPHATICS: No lymphadenopathy CARDIAC: RRR, no murmurs, rubs, gallops RESPIRATORY:  Clear to auscultation without rales, wheezing or rhonchi  ABDOMEN: Soft, non-tender, non-distended MUSCULOSKELETAL:  RUE weakness SKIN: Warm and dry NEUROLOGIC:  Alert and oriented x 3; aphasia PSYCHIATRIC:  Normal affect   ASSESSMENT:    ILR: linked with device clinic with Dr. Joan Flores. Afib burden 0%. Placed 09/04/2021. Can continue until Spring 2024. Next device check 03/12/2022  Stroke: c/f cardioembolic; however no source identifed.  No source was identified. No LAA or LV thrombus. Her EF is normal. She had a small PFO not amenable to closure  on TEE 01/2020. She had no evidence of afib at this time. Continue to encourage abstinence from smoking. In the past, her eliquis was dc'd and she had stroke on aspirin. Blood pressures is in good control.  On eliquis and asa, managed by neurology. Highly recommended smoking cessation as this is certainly contributing  HLD: hx of multiple CVAs. Goal < 70 mg /dL. LDL 75 mg /dL  09/02/2021, added zetia 10 mg to lipitor 80 mg daily   PLAN:    In order of problems listed above:   Follow up in 6 months      Medication Adjustments/Labs and Tests Ordered: Current medicines are reviewed at length with the patient today.  Concerns regarding medicines are outlined above.    Signed, Janina Mayo, MD  02/14/2022 8:14 AM    [place]

## 2022-02-14 NOTE — Patient Instructions (Signed)
Medication Instructions:  Your physician recommends that you continue on your current medications as directed. Please refer to the Current Medication list given to you today.  *If you need a refill on your cardiac medications before your next appointment, please call your pharmacy*   Follow-Up: At [facility], you and your health needs are our priority.  As part of our continuing mission to provide you with exceptional heart care, we have created designated Provider Care Teams.  These Care Teams include your primary Cardiologist (physician) and Advanced Practice Providers (APPs -  Physician Assistants and Nurse Practitioners) who all work together to provide you with the care you need, when you need it.  We recommend signing up for the patient portal called "MyChart".  Sign up information is provided on this After Visit Summary.  MyChart is used to connect with patients for Virtual Visits (Telemedicine).  Patients are able to view lab/test results, encounter notes, upcoming appointments, etc.  Non-urgent messages can be sent to your provider as well.   To learn more about what you can do with MyChart, go to [email or web address].    Your next appointment:   6 month(s)  The format for your next appointment:   In Person  Provider:   Branch, Mary E, MD  

## 2022-02-16 ENCOUNTER — Ambulatory Visit: Payer: Self-pay

## 2022-02-16 NOTE — Progress Notes (Signed)
[facility] Summary Report / Loop Recorder 

## 2022-02-21 ENCOUNTER — Ambulatory Visit (INDEPENDENT_AMBULATORY_CARE_PROVIDER_SITE_OTHER): Payer: Medicaid Other | Admitting: Student

## 2022-02-21 VITALS — BP 135/89 | HR 83 | Temp 97.7°F | Wt 240.6 lb

## 2022-02-21 DIAGNOSIS — Z01818 Encounter for other preprocedural examination: Secondary | ICD-10-CM | POA: Diagnosis present

## 2022-02-21 DIAGNOSIS — R32 Unspecified urinary incontinence: Secondary | ICD-10-CM

## 2022-02-21 DIAGNOSIS — R159 Full incontinence of feces: Secondary | ICD-10-CM

## 2022-02-21 NOTE — Patient Instructions (Signed)
Thank you, Ms.[name] for allowing [place] to provide your care today. Today we discussed .  Upcoming dental procedure Please schedule this 3 months, ideally 6 months, after your last stroke in August. Please stop taking your eliquis one day before your procedure and restart it 8-12 hours after the procedure. I will fill out the paperwork and it will be faxed to their office  Tobacco use Congratulations on stopping smoking!  Please keep this up and call if you need any gums or patches or medications to help.   Handicap sticker I have filled out the information for your handicap placard   History of incontinence I have placed an order for incontinence supplies  I have ordered the following labs for you:  Lab Orders  No laboratory test(s) ordered today      Referrals ordered today:   Referral Orders  No referral(s) requested today     I have ordered the following medication/changed the following medications:   Stop the following medications: There are no discontinued medications.   Start the following medications: No orders of the defined types were placed in this encounter.    Follow up: 4-6 months    Should you have any questions or concerns please call the internal medicine clinic at [phone].    Sanjuana Letters, D.O. [place]

## 2022-02-22 DIAGNOSIS — R32 Unspecified urinary incontinence: Secondary | ICD-10-CM | POA: Insufficient documentation

## 2022-02-22 NOTE — Addendum Note (Signed)
Addended by: Lalla Brothers T on: 02/22/2022 11:03 AM   Modules accepted: Level of Service

## 2022-02-22 NOTE — Progress Notes (Signed)
Internal Medicine Clinic Attending  Case discussed with Dr. Katsadouros  At the time of the visit.  We reviewed the resident's history and exam and pertinent patient test results.  I agree with the assessment, diagnosis, and plan of care documented in the resident's note.  

## 2022-02-22 NOTE — Assessment & Plan Note (Addendum)
Patient with history of urinary incontinence since prior CVA's. Will reorder incontinence supplies

## 2022-02-22 NOTE — Progress Notes (Signed)
CC: Pre-operative dental evaluation  HPI:  [name] is a 46 y.o. female living with a history stated below and presents today for evaluation prior to her dental procedures as well as to discuss need for handicap sticker and incontinence supplies. Please see problem based assessment and plan for additional details.  Past Medical History:  Diagnosis Date   Acute deep vein thrombosis of right iliac vein (Maria Porter) 09/14/2020   Stroke Maria Porter)     Current Outpatient Medications on File Prior to Visit  Medication Sig Dispense Refill   albuterol (PROVENTIL) (2.5 MG/3ML) 0.083% nebulizer solution Take 3 mLs (2.5 mg total) by nebulization every 4 (four) hours as needed for wheezing or shortness of breath. 75 mL 2   apixaban (ELIQUIS) 5 MG TABS tablet Take 1 tablet (5 mg total) by mouth 2 (two) times daily. 60 tablet 2   aspirin EC 81 MG tablet Take 1 tablet (81 mg total) by mouth daily. Swallow whole. (Patient taking differently: Take 81 mg by mouth daily.) 30 tablet 11   atorvastatin (LIPITOR) 80 MG tablet Take 1 tablet (80 mg total) by mouth daily. 90 tablet 1   cetirizine (ZYRTEC) 10 MG tablet TAKE 1 TABLET BY MOUTH EVERY DAY 30 tablet 2   [facility]S ACETAMINOPHEN 325 MG tablet TAKE 2 TABLETS BY MOUTH EVERY 6 (SIX) HOURS AS NEEDED FOR MILD PAIN (OR FEVER >/= 101). 30 tablet 2   ezetimibe (ZETIA) 10 MG tablet Take 1 tablet (10 mg total) by mouth daily. 90 tablet 3   fluticasone (FLONASE) 50 MCG/ACT nasal spray SPRAY 1 SPRAY INTO BOTH NOSTRILS DAILY. 16 mL 2   levothyroxine (SYNTHROID) 175 MCG tablet TAKE 1 TABLET (175 MCG TOTAL) BY MOUTH DAILY AT 6 (SIX) AM. 90 tablet 2   nicotine (NICODERM CQ - DOSED IN MG/24 HR) 7 mg/24hr patch Place 1 patch (7 mg total) onto the skin daily. 30 patch 1   ondansetron (ZOFRAN) 4 MG tablet Take 1 tablet (4 mg total) by mouth every 8 (eight) hours as needed for nausea or vomiting. 20 tablet 0   pantoprazole (PROTONIX) 40 MG tablet TAKE 1 TABLET BY MOUTH EVERY DAY 90  tablet 1   polyethylene glycol (MIRALAX / GLYCOLAX) 17 g packet Take 17 g by mouth daily as needed for mild constipation or moderate constipation.     VENTOLIN HFA 108 (90 Base) MCG/ACT inhaler INHALE 1-2 PUFFS BY MOUTH EVERY 6 HOURS AS NEEDED FOR WHEEZE OR SHORTNESS OF BREATH (Patient taking differently: Inhale 1-2 puffs into the lungs every 6 (six) hours as needed for wheezing or shortness of breath.) 18 each 3   No current facility-administered medications on file prior to visit.    Family History  Problem Relation Age of Onset   Sleep apnea Neg Hx     Social History   Socioeconomic History   Marital status: Single    Spouse name: Not on file   Number of children: Not on file   Years of education: Not on file   Highest education level: Not on file  Occupational History   Not on file  Tobacco Use   Smoking status: Every Day    Packs/day: 0.30    Types: Cigarettes    Start date: 03/07/2020    Last attempt to quit: 08/30/2021    Years since quitting: 0.4   Smokeless tobacco: Never   Tobacco comments:    1 pk per day.   Stopped x 1 month age    12/04/2021 patient  smokes 4 cigarettes daily  Substance and Sexual Activity   Alcohol use: Yes   Drug use: Not on file   Sexual activity: Not on file  Other Topics Concern   Not on file  Social History Narrative   Lives with sister   Left Handed   Drinks 2-3 cups caffeine daily   Social Determinants of Health   Financial Resource Strain: High Risk (11/15/2021)   Overall Financial Resource Strain (CARDIA)    Difficulty of Paying Living Expenses: Very hard  Food Insecurity: Food Insecurity Present (11/15/2021)   Hunger Vital Sign    Worried About Programme researcher, broadcasting/film/video in the Last Year: Often true    Ran Out of Food in the Last Year: Often true  Transportation Needs: Not on file  Physical Activity: Not on file  Stress: Not on file  Social Connections: Not on file  Intimate Partner Violence: Not on file    Review of  Systems: ROS negative except for what is noted on the assessment and plan.  Vitals:   02/21/22 1559  BP: 135/89  Pulse: 83  Temp: 97.7 F (36.5 C)  TempSrc: Oral  SpO2: 100%  Weight: 240 lb 9.6 oz (109.1 kg)    Physical Exam: Constitutional:  in no acute distress Eyes: conjunctiva non-erythematous Neck: supple Pulmonary/Chest: normal work of breathing on room air Neurological: alert & oriented x 3, chronic aphasia Skin: warm and dry Psych: pleasant  Assessment & Plan:   Preoperative clearance Assessment: Ms Maria Porter presents today for pre-operative clearance for full mouth extractions and alveoloplasty. She is continued on eliquisand aspirin indefinitely for her prior CVA's. She has normal renal function and recommend she hold her eliquis one day prior and restart it 8-12 hours after her procedure with recurrent CVA's off of her anti-platelet therapy  Plan: -hold eliquis 24 hours prior to procedure and restart 8-12 hours after -paperwork filled out and will be faxed to [facility]   Urinary incontinence Patient with history of urinary incontinence since prior CVA's. Will reorder incontinence supplies   Residual limb weakness after CVA Handicap placard renewed  Patient discussed with Dr. Avie Porter, D.O. [facility], PGY-3 Phone: [phone] Date 02/22/2022 Time 10:24 AM

## 2022-02-22 NOTE — Assessment & Plan Note (Signed)
Assessment: Maria Porter presents today for pre-operative clearance for full mouth extractions and alveoloplasty. She is continued on eliquisand aspirin indefinitely for her prior CVA's. She has normal renal function and recommend she hold her eliquis one day prior and restart it 8-12 hours after her procedure with recurrent CVA's off of her anti-platelet therapy  Plan: -hold eliquis 24 hours prior to procedure and restart 8-12 hours after -paperwork filled out and will be faxed to [place]

## 2022-02-26 ENCOUNTER — Other Ambulatory Visit: Payer: Self-pay | Admitting: Obstetrics and Gynecology

## 2022-02-26 NOTE — Patient Outreach (Signed)
Medicaid Managed Care   Nurse Care Manager Note  02/26/2022 Name:  Maria Porter MRN:  [id number] DOB:  1975/11/11  Maria Porter is an 46 y.o. year old female who is a primary patient of Katsadouros, Candace Gallus, MD.  The Medicaid Managed Care Coordination team was consulted for assistance with:    Chronic healthcare management needs, asthma, h/o stroke, h/o DVT, OSA, GERD, hypothyroid  [name] was given information about Medicaid Managed Care Coordination team services today. Maria Porter Patient agreed to services and verbal consent obtained.  Engaged with patient by telephone for initial visit in response to provider referral for case management and/or care coordination services.   Assessments/Interventions:  Review of past medical history, allergies, medications, health status, including review of consultants reports, laboratory and other test data, was performed as part of comprehensive evaluation and provision of chronic care management services.  SDOH (Social Determinants of Health) assessments and interventions performed: SDOH Interventions    Flowsheet Row Patient Outreach Telephone from 02/26/2022 in [facility] Coordination  SDOH Interventions   Transportation Interventions Intervention Not Indicated     Care Plan  No Known Allergies  Medications Reviewed Today     Reviewed by Gayla Medicus, RN (Registered Nurse) on 02/26/22 at 1334  Med List Status: <None>   Medication Order Taking? Sig Documenting Provider Last Dose Status Informant  albuterol (PROVENTIL) (2.5 MG/3ML) 0.083% nebulizer solution [id number] No Take 3 mLs (2.5 mg total) by nebulization every 4 (four) hours as needed for wheezing or shortness of breath. Rehman, Areeg N, DO Taking Active Self  apixaban (ELIQUIS) 5 MG TABS tablet [id number] No Take 1 tablet (5 mg total) by mouth 2 (two) times daily. Riesa Pope, MD Taking Active   aspirin EC 81 MG tablet [id number]  No Take 1 tablet (81 mg total) by mouth daily. Swallow whole.  Patient taking differently: Take 81 mg by mouth daily.   Riesa Pope, MD Taking Active Self  atorvastatin (LIPITOR) 80 MG tablet [id number] No Take 1 tablet (80 mg total) by mouth daily. Riesa Pope, MD Taking Active   cetirizine (ZYRTEC) 10 MG tablet [id number] No TAKE 1 TABLET BY MOUTH EVERY DAY Katsadouros, Vasilios, MD Taking Active   [facility] ACETAMINOPHEN 325 MG tablet [id number] No TAKE 2 TABLETS BY MOUTH EVERY 6 (SIX) HOURS AS NEEDED FOR MILD PAIN (OR FEVER >/= 101). Riesa Pope, MD Taking Active   ezetimibe (ZETIA) 10 MG tablet [id number] No Take 1 tablet (10 mg total) by mouth daily. Janina Mayo, MD Taking Active Self  fluticasone (FLONASE) 50 MCG/ACT nasal spray [id number] No SPRAY 1 SPRAY INTO BOTH NOSTRILS DAILY. Johny Blamer, DO Taking Active   levothyroxine (SYNTHROID) 175 MCG tablet [id number] No TAKE 1 TABLET (175 MCG TOTAL) BY MOUTH DAILY AT 6 (SIX) AM. Riesa Pope, MD Taking Active   nicotine (NICODERM CQ - DOSED IN MG/24 HR) 7 mg/24hr patch [id number] No Place 1 patch (7 mg total) onto the skin daily. Lacinda Axon, MD Taking Active   ondansetron [facility]) 4 MG tablet [id number] No Take 1 tablet (4 mg total) by mouth every 8 (eight) hours as needed for nausea or vomiting. Riesa Pope, MD Taking Active Self  pantoprazole (PROTONIX) 40 MG tablet [id number] No TAKE 1 TABLET BY MOUTH EVERY DAY Katsadouros, Vasilios, MD Taking Active   polyethylene glycol (MIRALAX / GLYCOLAX) 17 g packet [id number] No Take 17 g by mouth daily as needed for mild constipation or moderate constipation. [provider] Taking Active  Self           Med Note Gale Journey, Raliegh Ip   Sun Dec 10, 2021  8:52 AM)    VENTOLIN HFA 108 (90 Base) MCG/ACT inhaler [id number] No INHALE 1-2 PUFFS BY MOUTH EVERY 6 HOURS AS NEEDED FOR WHEEZE OR SHORTNESS OF BREATH  Patient taking differently: Inhale 1-2 puffs  into the lungs every 6 (six) hours as needed for wheezing or shortness of breath.   Belva Agee, MD Taking Active Self           Patient Active Problem List   Diagnosis Date Noted   Urinary incontinence 02/22/2022   Preoperative clearance 02/21/2022   Acute ischemic right MCA stroke (HCC) 12/10/2021   GERD (gastroesophageal reflux disease) 07/20/2021   Right shoulder pain 05/23/2021   Healthcare maintenance 03/07/2021   History of DVT (deep vein thrombosis) 10/06/2020   Family history of premature coronary heart disease 09/14/2020   OSA (obstructive sleep apnea) 09/14/2020   Pre-diabetes 02/03/2020   Subclinical hypothyroidism 02/03/2020   Tobacco use disorder 01/27/2020   Hyperlipemia 01/27/2020   Obesity 01/27/2020   History of CVA with residual deficit 01/26/2020   Extrinsic asthma 10/09/2004   Conditions to be addressed/monitored per PCP order:  Chronic healthcare management needs, asthma, h/o stroke, h/o DVT, OSA, GERD, hypothyroid, urinary incontinence  Care Plan : RN Care Manager Plan of Care  Updates made by Danie Chandler, RN since 02/26/2022 12:00 AM     Problem: Health Promotion or Disease Self-Management (General Plan of Care)      Long-Range Goal: Chronic Disease Management and Care Coordination Needs   Start Date: 02/26/2022  Expected End Date: 05/29/2022  Priority: High  Note:   Current Barriers:  Knowledge Deficits related to plan of care for management of asthma, h/o stroke, OSA, GERD, h/o DVT, hypothyroid  Chronic Disease Management support and education needs related to Knowledge Deficits related to plan of care for management of asthma, h/o stroke, OSA, GERD, h/o DVT, hypothyroid   RNCM Clinical Goal(s):  Patient will verbalize understanding of plan for management of   asthma, h/o stroke, OSA, GERD, h/o DVT, hypothyroid  as evidenced by patient report verbalize basic understanding of  asthma, h/o stroke, OSA, GERD, h/o DVT, hypothyroid   disease process and self health management plan as evidenced by patient report take all medications exactly as prescribed and will call provider for medication related questions as evidenced by patient report demonstrate understanding of rationale for each prescribed medication as evidenced by patient report  attend all scheduled medical appointments as evidenced by patient report continue to work with RN Care Manager to address care management and care coordination needs related to  asthma, h/o stroke, OSA, GERD, h/o DVT, hypothyroid as evidenced by adherence to CM Team Scheduled appointments through collaboration with RN Care manager, provider, and care team.   Interventions: Inter-disciplinary care team collaboration (see longitudinal plan of care) Evaluation of current treatment plan related to  self management and patient's adherence to plan as established by provider    (Status:  New goal.)  Long Term Goal Evaluation of current treatment plan related to asthma, h/o stroke, OSA, GERD, h/o DVT, hypothyroid, self-management and patient's adherence to plan as established by provider. Discussed plans with patient for ongoing care management follow up and provided patient with direct contact information for care management team Asthma , h/o stroke, OSA, GERD, h/o DVT, hypothyroid  Patient Goals/Self-Care Activities: Take all medications as prescribed Attend all scheduled provider appointments  Perform all self care activities independently  Perform IADL's (shopping, preparing meals, housekeeping, managing finances) independently Call provider office for new concerns or questions   Follow Up Plan:  The patient has been provided with contact information for the care management team and has been advised to call with any health related questions or concerns.  The care management team will reach out to the patient again over the next 30 business  days.    Long-Range Goal: Establish Plan of Care  for Chronic Disease Management Needs   Priority: High  Note:   Timeframe:  Long-Range Goal Priority:  High Start Date:     02/26/22                        Expected End Date:  ongoing                   Follow Up Date 03/27/22    - practice safe sex - schedule appointment for flu shot - schedule appointment for vaccines needed due to my age or health - schedule recommended health tests (blood work, mammogram, colonoscopy, pap test) - schedule and keep appointment for annual check-up    Why is this important?   Screening tests can find diseases early when they are easier to treat.  Your doctor or nurse will talk with you about which tests are important for you.  Getting shots for common diseases like the flu and shingles will help prevent them.     Follow Up:  Patient agrees to Care Plan and Follow-up.  Plan: The Managed Medicaid care management team will reach out to the patient again over the next 30 business  days. and The  Patient has been provided with contact information for the Managed Medicaid care management team and has been advised to call with any health related questions or concerns.  Date/time of next scheduled RN care management/care coordination outreach:  03/27/22 at 230.

## 2022-02-26 NOTE — Patient Instructions (Signed)
Hi Ms. Gainer, thank you for speaking with me today, have a nice afternoon!  Ms. Heath was given information about Medicaid Managed Care team care coordination services as a part of their [facility] Community Plan Medicaid benefit. Novella Olive verbally consented to engagement with the [facility] Managed Care team.   If you are experiencing a medical emergency, please call 911 or report to your local emergency department or urgent care.   If you have a non-emergency medical problem during routine business hours, please contact your provider's office and ask to speak with a nurse.   For questions related to your [facility], please call: [phone] or visit the homepage here: [email or web address]  If you would like to schedule transportation through your [facility], please call the following number at least 2 days in advance of your appointment: [phone]   Rides for urgent appointments can also be made after hours by calling Member Services.  Call the Behavioral Health Crisis Line at [phone], at any time, 24 hours a day, 7 days a week. If you are in danger or need immediate medical attention call 911.  If you would like help to quit smoking, call 1-800-QUIT-NOW (601-026-3630) OR Espaol: 1-855-Djelo-Ya (4-128-786-7672) o para ms informacin haga clic aqu or Text READY to 094-709 to register via text  Maria Porter - following are the goals we discussed in your visit today:        Timeframe:  Long-Range Goal Priority:  High Start Date:     02/26/22                        Expected End Date:  ongoing                   Follow Up Date 03/27/22    - practice safe sex - schedule appointment for flu shot - schedule appointment for vaccines needed due to my age or health - schedule recommended health tests (blood work, mammogram, colonoscopy, pap test) - schedule and  keep appointment for annual check-up    Why is this important?   Screening tests can find diseases early when they are easier to treat.  Your doctor or nurse will talk with you about which tests are important for you.  Getting shots for common diseases like the flu and shingles will help prevent them.     Patient verbalizes understanding of instructions and care plan provided today and agrees to view in MyChart. Active MyChart status and patient understanding of how to access instructions and care plan via MyChart confirmed with patient.     The Managed Medicaid care management team will reach out to the patient again over the next 30 business  days.  The  Patient  has been provided with contact information for the Managed Medicaid care management team and has been advised to call with any health related questions or concerns.   Kathi Der RN, BSN [place]  [facility] Care Management Coordinator - Managed Medicaid High Risk [phone]   Following is a copy of your plan of care:  Care Plan : RN Care Manager Plan of Care  Updates made by Danie Chandler, RN since 02/26/2022 12:00 AM     Problem: Health Promotion or Disease Self-Management (General Plan of Care)      Long-Range Goal: Chronic Disease Management and Care Coordination Needs   Start Date: 02/26/2022  Expected End Date: 05/29/2022  Priority:  High  Note:   Current Barriers:  Knowledge Deficits related to plan of care for management of asthma, h/o stroke, OSA, GERD, h/o DVT, hypothyroid  Chronic Disease Management support and education needs related to Knowledge Deficits related to plan of care for management of asthma, h/o stroke, OSA, GERD, h/o DVT, hypothyroid   RNCM Clinical Goal(s):  Patient will verbalize understanding of plan for management of   asthma, h/o stroke, OSA, GERD, h/o DVT, hypothyroid  as evidenced by patient report verbalize basic understanding of  asthma, h/o stroke, OSA, GERD, h/o  DVT, hypothyroid  disease process and self health management plan as evidenced by patient report take all medications exactly as prescribed and will call provider for medication related questions as evidenced by patient report demonstrate understanding of rationale for each prescribed medication as evidenced by patient report  attend all scheduled medical appointments as evidenced by patient report continue to work with RN Care Manager to address care management and care coordination needs related to  asthma, h/o stroke, OSA, GERD, h/o DVT, hypothyroid as evidenced by adherence to CM Team Scheduled appointments through collaboration with RN Care manager, provider, and care team.   Interventions: Inter-disciplinary care team collaboration (see longitudinal plan of care) Evaluation of current treatment plan related to  self management and patient's adherence to plan as established by provider    (Status:  New goal.)  Long Term Goal Evaluation of current treatment plan related to asthma, h/o stroke, OSA, GERD, h/o DVT, hypothyroid, self-management and patient's adherence to plan as established by provider. Discussed plans with patient for ongoing care management follow up and provided patient with direct contact information for care management team Asthma , h/o stroke, OSA, GERD, h/o DVT, hypothyroid  Patient Goals/Self-Care Activities: Take all medications as prescribed Attend all scheduled provider appointments Perform all self care activities independently  Perform IADL's (shopping, preparing meals, housekeeping, managing finances) independently Call provider office for new concerns or questions   Follow Up Plan:  The patient has been provided with contact information for the care management team and has been advised to call with any health related questions or concerns.  The care management team will reach out to the patient again over the next 30 business  days.

## 2022-03-12 ENCOUNTER — Ambulatory Visit (INDEPENDENT_AMBULATORY_CARE_PROVIDER_SITE_OTHER): Payer: Medicaid Other

## 2022-03-12 DIAGNOSIS — I639 Cerebral infarction, unspecified: Secondary | ICD-10-CM

## 2022-03-13 LAB — CUP PACEART REMOTE DEVICE CHECK
Date Time Interrogation Session: 20231106161533
Implantable Pulse Generator Implant Date: 20230501

## 2022-03-22 ENCOUNTER — Ambulatory Visit: Payer: Self-pay | Admitting: Adult Health

## 2022-03-27 ENCOUNTER — Other Ambulatory Visit: Payer: Medicaid Other | Admitting: Obstetrics and Gynecology

## 2022-03-27 NOTE — Patient Outreach (Signed)
  Medicaid Managed Care   Unsuccessful Attempt Note   03/27/2022 Name: Maria Porter MRN: [id number] DOB: [birth year]  Referred by: Belva Agee, MD Reason for referral : High Risk Managed Medicaid (Unsuccessful telephone outreach)  An unsuccessful telephone outreach was attempted today. The patient was referred to the case management team for assistance with care management and care coordination.    Follow Up Plan: The Managed Medicaid care management team will reach out to the patient again over the next 30 business  days.    Kathi Der RN, BSN   [facility] [employer] - Managed Medicaid High Risk [phone]

## 2022-03-27 NOTE — Patient Instructions (Signed)
Hi Maria Porter, sorry to have missed you today, I hope all is well  - as a part of your Medicaid benefit, you are eligible for care management and care coordination services at no cost or copay. I was unable to reach you by phone today but would be happy to help you with your health related needs. Please feel free to call me at [phone].  A member of the Managed Medicaid care management team will reach out to you again over the next 30 business  days.   Kathi Der RN, BSN [place]  [facility] [employer] - Managed Medicaid High Risk [phone]

## 2022-03-28 ENCOUNTER — Encounter: Payer: Self-pay | Admitting: Obstetrics and Gynecology

## 2022-03-28 ENCOUNTER — Other Ambulatory Visit: Payer: Medicaid Other | Admitting: Obstetrics and Gynecology

## 2022-03-28 NOTE — Patient Outreach (Signed)
Care Coordination  03/28/2022  Maria Porter 1975-12-09 [id number]  RNCM returned patient's phone call.  Patient stated she was at her daughter's house for Thanksgiving and will return to her house 12/2.  Patient states she is doing well and will qualify for Medicare March 2024.  Patient following up with [facility]-thinks her Medicaid expires end of November because she receives $2020.00 in disability benefits.    Kathi Der RN, BSN [place]  [facility] [employer] - Managed Medicaid High Risk [phone].

## 2022-04-08 ENCOUNTER — Other Ambulatory Visit: Payer: Self-pay | Admitting: Internal Medicine

## 2022-04-16 ENCOUNTER — Ambulatory Visit (INDEPENDENT_AMBULATORY_CARE_PROVIDER_SITE_OTHER): Payer: Medicaid Other

## 2022-04-16 DIAGNOSIS — I639 Cerebral infarction, unspecified: Secondary | ICD-10-CM

## 2022-04-16 LAB — CUP PACEART REMOTE DEVICE CHECK
Date Time Interrogation Session: 20231210230302
Implantable Pulse Generator Implant Date: 20230501

## 2022-04-17 NOTE — Progress Notes (Signed)
[facility] Summary Report / Loop Recorder 

## 2022-04-27 ENCOUNTER — Other Ambulatory Visit: Payer: Medicaid Other | Admitting: Obstetrics and Gynecology

## 2022-04-27 NOTE — Patient Instructions (Signed)
Hi Maria Porter to have missed you today-call would not go through - as a part of your Medicaid benefit, you are eligible for care management and care coordination services at no cost or copay. I was unable to reach you by phone today but would be happy to help you with your health related needs. Please feel free to call me at [phone].   A member of the Managed Medicaid care management team will reach out to you again over the next 30 business  days.   Kathi Der RN, BSN [place]  [facility] [employer] - Managed Medicaid High Risk [phone].

## 2022-04-27 NOTE — Patient Outreach (Signed)
Care Coordination  04/27/2022  Maria Porter 1976-03-23 [id number]   Medicaid Managed Care   Unsuccessful Outreach Note  04/27/2022 Name: Maria Porter MRN: [id number] DOB: 17-May-1975  Referred by: Belva Agee, MD Reason for referral : High Risk Managed Medicaid (Unsuccessful telephone outreach)   An unsuccessful telephone outreach was attempted today. The patient was referred to the case management team for assistance with care management and care coordination.   Follow Up Plan: The care management team will reach out to the patient again over the next 30 business  days.   Kathi Der RN, BSN [place]  [facility] [employer] - Managed Medicaid High Risk [phone]

## 2022-05-24 ENCOUNTER — Ambulatory Visit: Payer: Self-pay | Admitting: Adult Health

## 2022-05-24 NOTE — Progress Notes (Signed)
Carelink Summary Report / Loop Recorder

## 2022-05-30 ENCOUNTER — Other Ambulatory Visit: Payer: Medicaid Other | Admitting: Obstetrics and Gynecology

## 2022-05-30 NOTE — Patient Outreach (Signed)
  Medicaid Managed Care   Unsuccessful Attempt Note   05/30/2022 Name: Maria Porter MRN: [id number] DOB: July 12, 1975  Referred by: Riesa Pope, MD Reason for referral : High Risk Managed Medicaid (Unsuccessful telephone outreach)   A second unsuccessful telephone outreach was attempted today. The patient was referred to the case management team for assistance with care management and care coordination.    Follow Up Plan: The Managed Medicaid care management team will reach out to the patient again over the next 30 business  days. and The  Patient has been provided with contact information for the Managed Medicaid care management team and has been advised to call with any health related questions or concerns.   Aida Raider RN, BSN [place]  [facility] - Managed Medicaid High Risk [phone]

## 2022-05-30 NOTE — Patient Instructions (Signed)
HI Ms. Bovey, I hope you are doing well - as a part of your Medicaid benefit, you are eligible for care management and care coordination services at no cost or copay. I was unable to reach you by phone today but would be happy to help you with your health related needs. Please feel free to call me at [phone].  A member of the Managed Medicaid care management team will reach out to you again over the next 30 business  days.   Aida Raider RN, BSN [place]  [facility] - Managed Medicaid High Risk [phone]

## 2022-06-05 ENCOUNTER — Telehealth: Payer: Self-pay

## 2022-06-05 NOTE — Progress Notes (Signed)
..  Medicaid Managed Care Note  06/05/2022 Name: Maria Porter MRN: [id number] DOB: Nov 16, 1975  Maria Porter is a 47 y.o. year old female who is a primary care patient of Katsadouros, Candace Gallus, MD and is actively engaged with the care management team. I reached out to [name] by phone today to assist with re-scheduling a follow up visit with the RN Case Manager  Follow up plan: Unsuccessful telephone outreach attempt made. A HIPAA compliant phone message was left for the patient providing contact information and requesting a return call.  The care management team will reach out to the patient again over the next 7 days.   [place]

## 2022-06-11 ENCOUNTER — Telehealth: Payer: Self-pay

## 2022-06-11 NOTE — Telephone Encounter (Signed)
..   Medicaid Managed Care   Unsuccessful Outreach Note  06/11/2022 Name: Maria Porter MRN: [id number] DOB: 17-May-1975  Referred by: Riesa Pope, MD Reason for referral : Appointment (I called the patient today to get her rescheduled with the MM RNCM. I left my name and number on her VM.)   Third unsuccessful telephone outreach was attempted today. The patient was referred to the case management team for assistance with care management and care coordination. The patient's primary care provider has been notified of our unsuccessful attempts to make or maintain contact with the patient. The care management team is pleased to engage with this patient at any time in the future should he/she be interested in assistance from the care management team.   Follow Up Plan: We have been unable to make contact with the patient for follow up. The care management team is available to follow up with the patient after provider conversation with the patient regarding recommendation for care management engagement and subsequent re-referral to the care management team.   [name], [name]

## 2022-07-23 ENCOUNTER — Other Ambulatory Visit: Payer: Self-pay

## 2022-08-09 ENCOUNTER — Encounter: Payer: Self-pay | Admitting: Student

## 2022-08-09 ENCOUNTER — Ambulatory Visit (INDEPENDENT_AMBULATORY_CARE_PROVIDER_SITE_OTHER): Payer: Medicare HMO | Admitting: Student

## 2022-08-09 VITALS — BP 150/101 | HR 72 | Temp 97.9°F | Wt 248.7 lb

## 2022-08-09 DIAGNOSIS — E7800 Pure hypercholesterolemia, unspecified: Secondary | ICD-10-CM

## 2022-08-09 DIAGNOSIS — H539 Unspecified visual disturbance: Secondary | ICD-10-CM

## 2022-08-09 DIAGNOSIS — E785 Hyperlipidemia, unspecified: Secondary | ICD-10-CM

## 2022-08-09 DIAGNOSIS — E038 Other specified hypothyroidism: Secondary | ICD-10-CM | POA: Diagnosis not present

## 2022-08-09 DIAGNOSIS — H538 Other visual disturbances: Secondary | ICD-10-CM | POA: Diagnosis present

## 2022-08-09 DIAGNOSIS — F17200 Nicotine dependence, unspecified, uncomplicated: Secondary | ICD-10-CM

## 2022-08-09 DIAGNOSIS — Z8673 Personal history of transient ischemic attack (TIA), and cerebral infarction without residual deficits: Secondary | ICD-10-CM

## 2022-08-09 DIAGNOSIS — F17210 Nicotine dependence, cigarettes, uncomplicated: Secondary | ICD-10-CM

## 2022-08-09 MED ORDER — APIXABAN 5 MG PO TABS
5.0000 mg | ORAL_TABLET | Freq: Two times a day (BID) | ORAL | 1 refills | Status: DC
Start: 2022-08-09 — End: 2022-08-24

## 2022-08-09 MED ORDER — EZETIMIBE 10 MG PO TABS: 10.0000 mg | ORAL_TABLET | Freq: Every day | ORAL | 3 refills | Status: AC

## 2022-08-09 MED ORDER — ASPIRIN 81 MG PO TBEC: 81.0000 mg | DELAYED_RELEASE_TABLET | Freq: Every day | ORAL | 2 refills | Status: DC

## 2022-08-09 MED ORDER — LEVOTHYROXINE SODIUM 175 MCG PO TABS: 175.0000 ug | ORAL_TABLET | Freq: Every day | ORAL | 2 refills | Status: AC

## 2022-08-09 MED ORDER — PANTOPRAZOLE SODIUM 40 MG PO TBEC: 40.0000 mg | DELAYED_RELEASE_TABLET | Freq: Every day | ORAL | 1 refills | Status: AC

## 2022-08-09 MED ORDER — NICOTINE POLACRILEX 2 MG MT GUM: 2.0000 mg | CHEWING_GUM | OROMUCOSAL | 0 refills | Status: AC | PRN

## 2022-08-09 MED ORDER — NICOTINE 7 MG/24HR TD PT24: 7.0000 mg | MEDICATED_PATCH | TRANSDERMAL | 1 refills | Status: AC

## 2022-08-09 MED ORDER — ATORVASTATIN CALCIUM 80 MG PO TABS: 80.0000 mg | ORAL_TABLET | Freq: Every day | ORAL | 1 refills | Status: AC

## 2022-08-09 NOTE — Assessment & Plan Note (Signed)
Refilled atorvastatin and zetia for secondary prevention.

## 2022-08-09 NOTE — Progress Notes (Addendum)
CC: blurry vision  HPI:  [name] is a 47 y.o. female living with a history stated below and presents today for blurry vision. Please see problem based assessment and plan for additional details.  Past Medical History:  Diagnosis Date   Acute deep vein thrombosis of right iliac vein 09/14/2020   Stroke     Current Outpatient Medications on File Prior to Visit  Medication Sig Dispense Refill   albuterol (PROVENTIL) (2.5 MG/3ML) 0.083% nebulizer solution Take 3 mLs (2.5 mg total) by nebulization every 4 (four) hours as needed for wheezing or shortness of breath. 75 mL 2   cetirizine (ZYRTEC) 10 MG tablet TAKE 1 TABLET BY MOUTH EVERY DAY 30 tablet 2   [employer] ACETAMINOPHEN 325 MG tablet TAKE 2 TABLETS BY MOUTH EVERY 6 (SIX) HOURS AS NEEDED FOR MILD PAIN (OR FEVER >/= 101). 30 tablet 2   fluticasone (FLONASE) 50 MCG/ACT nasal spray SPRAY 1 SPRAY INTO BOTH NOSTRILS DAILY. 16 mL 2   ondansetron (ZOFRAN) 4 MG tablet Take 1 tablet (4 mg total) by mouth every 8 (eight) hours as needed for nausea or vomiting. 20 tablet 0   polyethylene glycol (MIRALAX / GLYCOLAX) 17 g packet Take 17 g by mouth daily as needed for mild constipation or moderate constipation.     VENTOLIN HFA 108 (90 Base) MCG/ACT inhaler INHALE 1-2 PUFFS BY MOUTH EVERY 6 HOURS AS NEEDED FOR WHEEZE OR SHORTNESS OF BREATH (Patient taking differently: Inhale 1-2 puffs into the lungs every 6 (six) hours as needed for wheezing or shortness of breath.) 18 each 3   No current facility-administered medications on file prior to visit.    Review of Systems: ROS negative except for what is noted on the assessment and plan.  Vitals:   08/09/22 1440 08/09/22 1509  BP: (!) 151/101 (!) 150/101  Pulse: 83 72  Temp: 97.9 F (36.6 C)   TempSrc: Oral   SpO2: 99%   Weight: 248 lb 11.2 oz (112.8 kg)     Physical Exam: Constitutional: well-appearing, in no acute distress HENT: normocephalic atraumatic, mucous membranes moist Eyes:  conjunctiva non-erythematous. Decreased vision in peripheral fields Neck: supple Cardiovascular: regular rate and rhythm, no m/r/g Pulmonary/Chest: normal work of breathing on room air MSK: normal bulk and tone Neurological: alert & oriented x 3. No facial asymmetry. No nystagmus. No pronator drift. 4/5 strength in right upper/lower extremity and 5/5 in left upper/lower extremities.  Skin: warm and dry Psych: normal mood  Assessment & Plan:   Blurry vision, bilateral 2 months ago she was sitting watching tv when a blackness came over both of her eyes. Denies any eye pain. This episode lasted 30 seconds and then resolved. Since this episode she has had persistent blurriness in both eyes and difficulty with seeing images far away. Denies any lightheadedness, dizziness, or worsening of her chronic neurological deficits. Denies any new headaches or difficulty with balance. She has been adherent to her aspirin and eliquis therapy.   On physical exam she is unable to discern what fingers I am holding up in her peripheral vision in any visual field. Otherwise neurological exam is at baseline with right upper extremity weakness.   Differential includes ischemic event with history of multiple CVA's. Low suspicion for GCA; no headache or jaw pain. Do not suspect infectious etiology. Her visual loss did not persist like I would imaging would happen if this were a retinal detachment. No other symptoms endorsed concerning for sarcoidosis. Has had negative RPR in  the past. Prior angiograms without arterial abnormalities. Encouraged to monitor for new neurological deficits and call clinic immediately if occur. Will order MRI brain w/o contrast for any new lesions/masses and urgent referral to ophthalmology. Also encouraged her to follow up with her neurologist. At follow up can check b12, folate.   Subclinical hypothyroidism Has been out of her levothyroxine. Will refill prescription and have her follow up in 4  weeks for lab testing.   Tobacco use disorder Quit smoking but has started back smoking 5 cigarettes per day. Interested in quitting and have prescribed nicotine patches and gum. With her history of CVA will be important to continue smoking cessation  Hyperlipemia Refilled atorvastatin and zetia for secondary prevention.   Patient discussed with Dr. Jeanine LuzLau  Maria Porter, D.O. [facility], PGY-3 Phone: [phone] Date 08/10/2022 Time 10:59 AM

## 2022-08-09 NOTE — Assessment & Plan Note (Signed)
Quit smoking but has started back smoking 5 cigarettes per day. Interested in quitting and have prescribed nicotine patches and gum. With her history of CVA will be important to continue smoking cessation

## 2022-08-09 NOTE — Patient Instructions (Signed)
Thank you, Ms.[name] for allowing [place] to provide your care today. Today we discussed .  Vision changes We will get you in to see the eye doctor and get an MRI. Please also call the neurologist and schedule an appointment with them.     I have refilled all of your medications  I have ordered the following labs for you:  Lab Orders  No laboratory test(s) ordered today    Referrals ordered today:    Referral Orders         Ambulatory referral to Ophthalmology      I have ordered the following medication/changed the following medications:   Stop the following medications: Medications Discontinued During This Encounter  Medication Reason   aspirin EC 81 MG tablet    ezetimibe (ZETIA) 10 MG tablet Reorder   nicotine (NICODERM CQ - DOSED IN MG/24 HR) 7 mg/24hr patch Reorder   atorvastatin (LIPITOR) 80 MG tablet Reorder   pantoprazole (PROTONIX) 40 MG tablet Reorder   levothyroxine (SYNTHROID) 175 MCG tablet Reorder   ELIQUIS 5 MG TABS tablet Reorder     Start the following medications: Meds ordered this encounter  Medications   atorvastatin (LIPITOR) 80 MG tablet    Sig: Take 1 tablet (80 mg total) by mouth daily.    Dispense:  90 tablet    Refill:  1    DX Code Needed  .   aspirin EC 81 MG tablet    Sig: Take 1 tablet (81 mg total) by mouth daily. Swallow whole.    Dispense:  150 tablet    Refill:  2   apixaban (ELIQUIS) 5 MG TABS tablet    Sig: Take 1 tablet (5 mg total) by mouth 2 (two) times daily.    Dispense:  180 tablet    Refill:  1   ezetimibe (ZETIA) 10 MG tablet    Sig: Take 1 tablet (10 mg total) by mouth daily.    Dispense:  90 tablet    Refill:  3   levothyroxine (SYNTHROID) 175 MCG tablet    Sig: Take 1 tablet (175 mcg total) by mouth daily at 6 (six) AM.    Dispense:  90 tablet    Refill:  2   nicotine (NICODERM CQ - DOSED IN MG/24 HR) 7 mg/24hr patch    Sig: Place 1 patch (7 mg total) onto the skin daily.    Dispense:  30 patch    Refill:   1   pantoprazole (PROTONIX) 40 MG tablet    Sig: Take 1 tablet (40 mg total) by mouth daily.    Dispense:  90 tablet    Refill:  1   nicotine polacrilex (NICORETTE) 2 MG gum    Sig: Take 1 each (2 mg total) by mouth as needed for smoking cessation.    Dispense:  100 tablet    Refill:  0     Follow up:  1 month    Should you have any questions or concerns please call the internal medicine clinic at [phone].    Sanjuana Letters, D.O. [facility] [place]

## 2022-08-09 NOTE — Progress Notes (Deleted)
op

## 2022-08-09 NOTE — Assessment & Plan Note (Addendum)
2 months ago she was sitting watching tv when a blackness came over both of her eyes. Denies any eye pain. This episode lasted 30 seconds and then resolved. Since this episode she has had persistent blurriness in both eyes and difficulty with seeing images far away. Denies any lightheadedness, dizziness, or worsening of her chronic neurological deficits. Denies any new headaches or difficulty with balance. She has been adherent to her aspirin and eliquis therapy.   On physical exam she is unable to discern what fingers I am holding up in her peripheral vision in any visual field. Otherwise neurological exam is at baseline with right upper extremity weakness.   Differential includes ischemic event with history of multiple CVA's. Low suspicion for GCA; no headache or jaw pain. Do not suspect infectious etiology. Her visual loss did not persist like I would imaging would happen if this were a retinal detachment. No other symptoms endorsed concerning for sarcoidosis. Has had negative RPR in the past. Prior angiograms without arterial abnormalities. Encouraged to monitor for new neurological deficits and call clinic immediately if occur. Will order MRI brain w/o contrast for any new lesions/masses and urgent referral to ophthalmology. Also encouraged her to follow up with her neurologist. At follow up can check b12, folate.

## 2022-08-09 NOTE — Assessment & Plan Note (Signed)
Has been out of her levothyroxine. Will refill prescription and have her follow up in 4 weeks for lab testing.

## 2022-08-10 NOTE — Progress Notes (Signed)
Internal Medicine Clinic Attending  Case discussed with Dr. Katsadouros  At the time of the visit.  We reviewed the resident's history and exam and pertinent patient test results.  I agree with the assessment, diagnosis, and plan of care documented in the resident's note.  

## 2022-08-10 NOTE — Addendum Note (Signed)
Addended by: Dickie La on: 08/10/2022 10:51 AM   Modules accepted: Level of Service

## 2022-08-15 ENCOUNTER — Ambulatory Visit: Payer: Medicare Other | Admitting: Internal Medicine

## 2022-08-17 ENCOUNTER — Encounter: Payer: Self-pay | Admitting: Internal Medicine

## 2022-08-17 ENCOUNTER — Other Ambulatory Visit: Payer: Self-pay

## 2022-08-17 DIAGNOSIS — J45909 Unspecified asthma, uncomplicated: Secondary | ICD-10-CM

## 2022-08-17 MED ORDER — ALBUTEROL SULFATE (2.5 MG/3ML) 0.083% IN NEBU
2.5000 mg | INHALATION_SOLUTION | RESPIRATORY_TRACT | 2 refills | Status: AC | PRN
Start: 2022-08-17 — End: 2023-08-17

## 2022-08-20 ENCOUNTER — Ambulatory Visit: Payer: Medicare HMO | Admitting: Internal Medicine

## 2022-08-20 ENCOUNTER — Other Ambulatory Visit: Payer: Self-pay

## 2022-08-20 DIAGNOSIS — F17200 Nicotine dependence, unspecified, uncomplicated: Secondary | ICD-10-CM

## 2022-08-21 ENCOUNTER — Inpatient Hospital Stay (HOSPITAL_COMMUNITY): Payer: Medicare HMO

## 2022-08-21 ENCOUNTER — Emergency Department (HOSPITAL_COMMUNITY): Payer: Medicare HMO

## 2022-08-21 ENCOUNTER — Other Ambulatory Visit: Payer: Self-pay

## 2022-08-21 ENCOUNTER — Inpatient Hospital Stay (HOSPITAL_COMMUNITY)
Admission: EM | Admit: 2022-08-21 | Discharge: 2022-08-24 | DRG: 065 | Disposition: A | Discharge status: Home Health | Payer: Medicare HMO | Attending: Internal Medicine | Admitting: Internal Medicine

## 2022-08-21 DIAGNOSIS — I10 Essential (primary) hypertension: Secondary | ICD-10-CM | POA: Diagnosis present

## 2022-08-21 DIAGNOSIS — Z7901 Long term (current) use of anticoagulants: Secondary | ICD-10-CM | POA: Diagnosis not present

## 2022-08-21 DIAGNOSIS — R4701 Aphasia: Secondary | ICD-10-CM | POA: Diagnosis present

## 2022-08-21 DIAGNOSIS — I63441 Cerebral infarction due to embolism of right cerebellar artery: Principal | ICD-10-CM | POA: Diagnosis present

## 2022-08-21 DIAGNOSIS — Z66 Do not resuscitate: Secondary | ICD-10-CM | POA: Diagnosis present

## 2022-08-21 DIAGNOSIS — H5340 Unspecified visual field defects: Secondary | ICD-10-CM | POA: Diagnosis not present

## 2022-08-21 DIAGNOSIS — R471 Dysarthria and anarthria: Secondary | ICD-10-CM | POA: Diagnosis present

## 2022-08-21 DIAGNOSIS — I69351 Hemiplegia and hemiparesis following cerebral infarction affecting right dominant side: Secondary | ICD-10-CM | POA: Diagnosis not present

## 2022-08-21 DIAGNOSIS — E785 Hyperlipidemia, unspecified: Secondary | ICD-10-CM | POA: Diagnosis present

## 2022-08-21 DIAGNOSIS — F17210 Nicotine dependence, cigarettes, uncomplicated: Secondary | ICD-10-CM | POA: Diagnosis present

## 2022-08-21 DIAGNOSIS — D6859 Other primary thrombophilia: Secondary | ICD-10-CM | POA: Diagnosis present

## 2022-08-21 DIAGNOSIS — G4733 Obstructive sleep apnea (adult) (pediatric): Secondary | ICD-10-CM | POA: Diagnosis present

## 2022-08-21 DIAGNOSIS — Z79890 Hormone replacement therapy (postmenopausal): Secondary | ICD-10-CM | POA: Diagnosis not present

## 2022-08-21 DIAGNOSIS — Z86718 Personal history of other venous thrombosis and embolism: Secondary | ICD-10-CM | POA: Diagnosis not present

## 2022-08-21 DIAGNOSIS — H53461 Homonymous bilateral field defects, right side: Secondary | ICD-10-CM | POA: Diagnosis present

## 2022-08-21 DIAGNOSIS — J634 Siderosis: Secondary | ICD-10-CM | POA: Diagnosis present

## 2022-08-21 DIAGNOSIS — D6869 Other thrombophilia: Secondary | ICD-10-CM | POA: Diagnosis not present

## 2022-08-21 DIAGNOSIS — Z7982 Long term (current) use of aspirin: Secondary | ICD-10-CM

## 2022-08-21 DIAGNOSIS — E669 Obesity, unspecified: Secondary | ICD-10-CM | POA: Diagnosis present

## 2022-08-21 DIAGNOSIS — M79652 Pain in left thigh: Secondary | ICD-10-CM | POA: Diagnosis present

## 2022-08-21 DIAGNOSIS — I639 Cerebral infarction, unspecified: Secondary | ICD-10-CM | POA: Diagnosis present

## 2022-08-21 DIAGNOSIS — E038 Other specified hypothyroidism: Secondary | ICD-10-CM | POA: Diagnosis present

## 2022-08-21 DIAGNOSIS — R29709 NIHSS score 9: Secondary | ICD-10-CM | POA: Diagnosis present

## 2022-08-21 DIAGNOSIS — F17200 Nicotine dependence, unspecified, uncomplicated: Secondary | ICD-10-CM | POA: Diagnosis present

## 2022-08-21 DIAGNOSIS — I6389 Other cerebral infarction: Secondary | ICD-10-CM | POA: Diagnosis not present

## 2022-08-21 DIAGNOSIS — Z5986 Financial insecurity: Secondary | ICD-10-CM

## 2022-08-21 DIAGNOSIS — Q2112 Patent foramen ovale: Secondary | ICD-10-CM | POA: Diagnosis not present

## 2022-08-21 DIAGNOSIS — Z6834 Body mass index (BMI) 34.0-34.9, adult: Secondary | ICD-10-CM

## 2022-08-21 DIAGNOSIS — Z79899 Other long term (current) drug therapy: Secondary | ICD-10-CM

## 2022-08-21 DIAGNOSIS — R7303 Prediabetes: Secondary | ICD-10-CM | POA: Diagnosis present

## 2022-08-21 DIAGNOSIS — M79606 Pain in leg, unspecified: Secondary | ICD-10-CM | POA: Diagnosis not present

## 2022-08-21 DIAGNOSIS — Z6839 Body mass index (BMI) 39.0-39.9, adult: Secondary | ICD-10-CM

## 2022-08-21 LAB — PROTIME-INR
INR: 1.1 (ref 0.8–1.2)
Prothrombin Time: 14 seconds (ref 11.4–15.2)

## 2022-08-21 LAB — URINALYSIS, ROUTINE W REFLEX MICROSCOPIC
Bilirubin Urine: NEGATIVE
Glucose, UA: NEGATIVE mg/dL
Hgb urine dipstick: NEGATIVE
Ketones, ur: NEGATIVE mg/dL
Leukocytes,Ua: NEGATIVE
Nitrite: NEGATIVE
Protein, ur: NEGATIVE mg/dL
Specific Gravity, Urine: >1.046 — ABNORMAL HIGH (ref 1.005–1.030)
pH: 5 (ref 5.0–8.0)

## 2022-08-21 LAB — RAPID URINE DRUG SCREEN, HOSP PERFORMED
Amphetamines: NOT DETECTED
Barbiturates: NOT DETECTED
Benzodiazepines: NOT DETECTED
Cocaine: NOT DETECTED
Opiates: NOT DETECTED
Tetrahydrocannabinol: NOT DETECTED

## 2022-08-21 LAB — CBC
HCT: 43.1 % (ref 36.0–46.0)
Hemoglobin: 14.1 g/dL (ref 12.0–15.0)
MCH: 28.3 pg (ref 26.0–34.0)
MCHC: 32.7 g/dL (ref 30.0–36.0)
MCV: 86.5 fL (ref 80.0–100.0)
Platelets: 399 10*3/uL (ref 150–400)
RBC: 4.98 MIL/uL (ref 3.87–5.11)
RDW: 13 % (ref 11.5–15.5)
WBC: 7.7 10*3/uL (ref 4.0–10.5)
nRBC: 0 % (ref 0.0–0.2)

## 2022-08-21 LAB — COMPREHENSIVE METABOLIC PANEL
ALT: 35 U/L (ref 0–44)
AST: 27 U/L (ref 15–41)
Albumin: 3.9 g/dL (ref 3.5–5.0)
Alkaline Phosphatase: 140 U/L — ABNORMAL HIGH (ref 38–126)
Anion gap: 12 (ref 5–15)
BUN: 8 mg/dL (ref 6–20)
CO2: 24 mmol/L (ref 22–32)
Calcium: 9.6 mg/dL (ref 8.9–10.3)
Chloride: 102 mmol/L (ref 98–111)
Creatinine, Ser: 0.93 mg/dL (ref 0.44–1.00)
GFR, Estimated: >60 mL/min (ref >60)
Glucose, Bld: 109 mg/dL — ABNORMAL HIGH (ref 70–99)
Potassium: 3.8 mmol/L (ref 3.5–5.1)
Sodium: 138 mmol/L (ref 135–145)
Total Bilirubin: 0.4 mg/dL (ref 0.3–1.2)
Total Protein: 8.4 g/dL — ABNORMAL HIGH (ref 6.5–8.1)

## 2022-08-21 LAB — DIFFERENTIAL
Abs Immature Granulocytes: 0.03 10*3/uL (ref 0.00–0.07)
Basophils Absolute: 0 10*3/uL (ref 0.0–0.1)
Basophils Relative: 1 %
Eosinophils Absolute: 0.2 10*3/uL (ref 0.0–0.5)
Eosinophils Relative: 3 %
Immature Granulocytes: 0 %
Lymphocytes Relative: 40 %
Lymphs Abs: 3 10*3/uL (ref 0.7–4.0)
Monocytes Absolute: 0.4 10*3/uL (ref 0.1–1.0)
Monocytes Relative: 5 %
Neutro Abs: 4 10*3/uL (ref 1.7–7.7)
Neutrophils Relative %: 51 %

## 2022-08-21 LAB — CBG MONITORING, ED: Glucose-Capillary: 107 mg/dL — ABNORMAL HIGH (ref 70–99)

## 2022-08-21 LAB — I-STAT CHEM 8, ED
BUN: 8 mg/dL (ref 6–20)
Calcium, Ion: 1.1 mmol/L — ABNORMAL LOW (ref 1.15–1.40)
Chloride: 104 mmol/L (ref 98–111)
Creatinine, Ser: 0.7 mg/dL (ref 0.44–1.00)
Glucose, Bld: 105 mg/dL — ABNORMAL HIGH (ref 70–99)
HCT: 45 % (ref 36.0–46.0)
Hemoglobin: 15.3 g/dL — ABNORMAL HIGH (ref 12.0–15.0)
Potassium: 3.9 mmol/L (ref 3.5–5.1)
Sodium: 139 mmol/L (ref 135–145)
TCO2: 25 mmol/L (ref 22–32)

## 2022-08-21 LAB — ETHANOL: Alcohol, Ethyl (B): <10 mg/dL (ref <10)

## 2022-08-21 LAB — APTT: aPTT: 29 seconds (ref 24–36)

## 2022-08-21 MED ORDER — ATORVASTATIN CALCIUM 80 MG PO TABS
80.0000 mg | ORAL_TABLET | Freq: Every day | ORAL | Status: DC
  Administered 2022-08-22 – 2022-08-24 (×3): 80 mg via ORAL
  Filled 2022-08-21 (×3): qty 1

## 2022-08-21 MED ORDER — APIXABAN 5 MG PO TABS
5.0000 mg | ORAL_TABLET | Freq: Two times a day (BID) | ORAL | Status: DC
  Administered 2022-08-22: 5 mg via ORAL
  Filled 2022-08-21: qty 1

## 2022-08-21 MED ORDER — LEVOTHYROXINE SODIUM 75 MCG PO TABS
175.0000 ug | ORAL_TABLET | Freq: Every day | ORAL | Status: DC
  Administered 2022-08-22 – 2022-08-24 (×3): 175 ug via ORAL
  Filled 2022-08-21 (×3): qty 1

## 2022-08-21 MED ORDER — IOHEXOL 350 MG/ML SOLN
75.0000 mL | Freq: Once | INTRAVENOUS | Status: AC | PRN
  Administered 2022-08-21: 75 mL via INTRAVENOUS

## 2022-08-21 MED ORDER — PROCHLORPERAZINE EDISYLATE 10 MG/2ML IJ SOLN
10.0000 mg | Freq: Once | INTRAMUSCULAR | Status: AC
  Administered 2022-08-21: 10 mg via INTRAVENOUS
  Filled 2022-08-21: qty 2

## 2022-08-21 MED ORDER — ALBUTEROL SULFATE (2.5 MG/3ML) 0.083% IN NEBU
2.5000 mg | INHALATION_SOLUTION | RESPIRATORY_TRACT | Status: DC | PRN
  Administered 2022-08-22: 2.5 mg via RESPIRATORY_TRACT
  Filled 2022-08-21: qty 3

## 2022-08-21 MED ORDER — ALBUTEROL SULFATE HFA 108 (90 BASE) MCG/ACT IN AERS
1.0000 | INHALATION_SPRAY | Freq: Four times a day (QID) | RESPIRATORY_TRACT | 3 refills | Status: AC | PRN
Start: 2022-08-21 — End: ?

## 2022-08-21 MED ORDER — LORAZEPAM 1 MG PO TABS
0.5000 mg | ORAL_TABLET | ORAL | Status: AC | PRN
  Administered 2022-08-21: 0.5 mg via ORAL
  Filled 2022-08-21: qty 1

## 2022-08-21 MED ORDER — PANTOPRAZOLE SODIUM 40 MG PO TBEC
40.0000 mg | DELAYED_RELEASE_TABLET | Freq: Every day | ORAL | Status: DC
  Administered 2022-08-22 – 2022-08-24 (×3): 40 mg via ORAL
  Filled 2022-08-21: qty 1
  Filled 2022-08-21: qty 2
  Filled 2022-08-21: qty 1

## 2022-08-21 MED ORDER — ASPIRIN 81 MG PO TBEC
81.0000 mg | DELAYED_RELEASE_TABLET | Freq: Every day | ORAL | Status: DC
  Administered 2022-08-22: 81 mg via ORAL
  Filled 2022-08-21: qty 1

## 2022-08-21 MED ORDER — DIPHENHYDRAMINE HCL 50 MG/ML IJ SOLN
25.0000 mg | Freq: Once | INTRAMUSCULAR | Status: AC
  Administered 2022-08-21: 25 mg via INTRAVENOUS
  Filled 2022-08-21: qty 1

## 2022-08-21 MED ORDER — POLYETHYLENE GLYCOL 3350 17 G PO PACK: 17.0000 g | PACK | Freq: Every day | ORAL | Status: DC | PRN

## 2022-08-21 MED ORDER — EZETIMIBE 10 MG PO TABS
10.0000 mg | ORAL_TABLET | Freq: Every day | ORAL | Status: DC
  Administered 2022-08-22 – 2022-08-24 (×3): 10 mg via ORAL
  Filled 2022-08-21 (×3): qty 1

## 2022-08-21 NOTE — ED Notes (Signed)
[facility] called to activate Stroke 

## 2022-08-21 NOTE — ED Triage Notes (Signed)
Pt reports loss of peripheral vision in R eye at 1445 today, followed by headache that feels abnormal compared to her normal headache. Hx stroke with R sided weakness.

## 2022-08-21 NOTE — Consult Note (Signed)
NEURO HOSPITALIST CONSULT NOTE   Requesting physician: Dr. Rhunette Croft  Reason for Consult: Acute onset of peripheral vision loss in right eye  History obtained from:  Patient and Chart     HPI:                                                                                                                                          Geonna Lockyer is an 47 y.o. female with a PMHx of multiple cryptogenic strokes with residual right sided weakness (sees Dr. Pearlean Brownie in follow up) and post-stroke Foreign Accent Syndrome (accent now sounds slightly [place]), DVT of right iliac vein (on Eliquis), HLD and obesity who presents with acute onset of loss of vision in the peripheral right visual fields of both eyes, which was followed by a headache that felt unusual as compared to her usual headaches. She does not have a diagnosis of HTN but states that at her doctor's visit 2 weeks ago, her BP was 150/101. She is on Eliquis BID for secondary stroke prevention.   Dr. York Spaniel clinic note reom January of 2023 has been reviewed: "HPI: Initial visit 11/01/2020 Ms. Ugarte is a 47 year old African-American lady seen today for initial office consultation visit for stroke.  History is obtained from the patient and review of electronic medical records as well as Care Everywhere and I personally reviewed pertinent imaging films in PACS.  She has past medical history of remote left frontal stroke, obesity, hyperlipidemia who presented initially on 01/26/2020 with sudden onset aphasia and right hemiparesis while at home.  CT scan of the head on admission in the ED showed old left frontal infarct but CT angiogram was negative for LVO and CT perfusion showed no mismatch.  Symptoms are improving initial NIH stroke scale was 9 and improved to 3.  tPA was not administered due to rapidly improving symptoms.  MRI scan showed left frontal cortical and subcortical infarct adjacent to the old infarct.  MRI also showed old  bilateral frontal and left inferior cerebellar infarcts of remote age.  MRA of the brain and neck showed no significant large vessel stenosis or occlusion.  EEG showed no seizure activity.  2D echo showed ejection fraction of 55 to 60%.  TCD bubble studies was positive with a few hits noted only after Valsalva history of her foot reveals clinically insignificant PFO.  TEE also showed a small right-to-left shunt but no cardiac source of embolism.  Lower extremity Dopplers were negative for DVT.  Spinal tap was obtained which showed WBC 3, red cells 10, glucose 72 and protein 44 mg percent and no evidence of infection.  LDL cholesterol is 125 mg percent and hemoglobin A1c was 5.8.  Hypercoagulable panel labs are negative except for slightly elevated anticardiolipin IgM antibody of 14.  ESR and C-reactive protein B12 are normal.  Patient was started on aspirin and Plavix for 3 weeks followed by Plavix alone.  Tested positive for sleep apnea on the NOx 3 monitor and participated in the sleep smart study and was randomized to the medical treatment.  Patient states she is she was doing well after that but while in [place] she developed again sudden onset of aphasia and right hemiparesis and she was admitted to [facility]  on 09/16/2020 with NIH stroke scale 19.  She received IV tPA and CT angiogram showed left M1 occlusion she underwent mechanical thrombectomy which was successful after second pass with TICI 2b revascularization.  2D echo and TEE were repeated which were unremarkable.  Lower extremity venous Dopplers on 09/13/2020 showed acute DVT in the right external iliac vein.  She was started on Eliquis since then.  She states she started Eliquis well without bruising or bleeding.  She states aphasia is improving though she still has significant hesitancy and struggles to speak and has to speak slowly.  Her comprehension is quite good.  Right-sided strength is improving though she has  still weakness in the right grip and intrinsic hand muscles.  She is able to ambulate independently.  Patient has moved recently to [facility] and is now living with her sister.  She has had normal recurrent stroke or TIA symptoms. Update 05/16/2021 : She returns for follow-up after last visit 6 months ago.  She states she is doing well and has not had any recurrent stroke or TIA symptoms.  She is participating in outpatient physical occupational and has recently started speech therapy which seems to be helping her.  She is met most of her goals.  She still has a different [place] accent which has not resolved.  She can walk short distances indoors without a cane but uses a cane for long distances.  She remains on Eliquis for DVT which she is tolerating well without bruising or bleeding.  Her blood pressure is under good control today it is 116/84.  She is tolerating Lipitor well without muscle aches and pains.  She continues to smoke but is trying to quit and primary physician started her on NicoDerm patch.  She is started eating healthy and is losing some weight.  She did not make an appointment to see Dr. Vickey Huger or Frances Furbish whom I referred her to at last visit for sleep apnea but is now willing to do so.  She continues to have mild expressive aphasia and spastic right hemiplegia which is unchanged.  She now wants to drive."  After the above clinic visit, she re-presented to the hospital in August of 2023 with recurrent stroke symptoms consisting of acute onset dysarthria and was evaluated by Dr. Roda Shutters. Hypercoagulable workup only showed antiphospholipid IgM 14, slightly elevated.  It was determined at that time that she would need lifelong anticoagulation give hx of venous and arterial thrombosis.  She has a known small PFO seen on TEE; Cardiology has determined that no PFO closure is indicated.   Past Medical History:  Diagnosis Date   Acute deep vein thrombosis of right iliac vein 09/14/2020   Stroke      Past Surgical History:  Procedure Laterality Date   BUBBLE STUDY  01/29/2020   Procedure: BUBBLE STUDY;  Surgeon: Little Ishikawa, MD;  Location[facility] ENDOSCOPY;  Service: Cardiovascular;;   TEE WITHOUT CARDIOVERSION N/A 01/29/2020   Procedure: TRANSESOPHAGEAL ECHOCARDIOGRAM (TEE);  Surgeon: Little Ishikawa, MD;  Location: [facility] ENDOSCOPY;  Service: Cardiovascular;  Laterality: N/A;    Family History  Problem Relation Age of Onset   Sleep apnea Neg Hx              Social History:  reports that she has been smoking cigarettes. She started smoking about 2 years ago. She has been smoking an average of .5 packs per day. She has never used smokeless tobacco. She reports current alcohol use. No history on file for drug use.  No Known Allergies  MEDICATIONS:                                                                                                                      No current facility-administered medications on file prior to encounter.   Current Outpatient Medications on File Prior to Encounter  Medication Sig Dispense Refill   albuterol (PROVENTIL) (2.5 MG/3ML) 0.083% nebulizer solution Take 3 mLs (2.5 mg total) by nebulization every 4 (four) hours as needed for wheezing or shortness of breath. 75 mL 2   albuterol (VENTOLIN HFA) 108 (90 Base) MCG/ACT inhaler Inhale 1-2 puffs into the lungs every 6 (six) hours as needed for wheezing or shortness of breath. 18 each 3   apixaban (ELIQUIS) 5 MG TABS tablet Take 1 tablet (5 mg total) by mouth 2 (two) times daily. 180 tablet 1   aspirin EC 81 MG tablet Take 1 tablet (81 mg total) by mouth daily. Swallow whole. 150 tablet 2   atorvastatin (LIPITOR) 80 MG tablet Take 1 tablet (80 mg total) by mouth daily. 90 tablet 1   cetirizine (ZYRTEC) 10 MG tablet TAKE 1 TABLET BY MOUTH EVERY DAY 30 tablet 2   [employer] ACETAMINOPHEN 325 MG tablet TAKE 2 TABLETS BY MOUTH EVERY 6 (SIX) HOURS AS NEEDED FOR MILD PAIN (OR FEVER >/= 101). 30 tablet 2    ezetimibe (ZETIA) 10 MG tablet Take 1 tablet (10 mg total) by mouth daily. 90 tablet 3   fluticasone (FLONASE) 50 MCG/ACT nasal spray SPRAY 1 SPRAY INTO BOTH NOSTRILS DAILY. 16 mL 2   levothyroxine (SYNTHROID) 175 MCG tablet Take 1 tablet (175 mcg total) by mouth daily at 6 (six) AM. 90 tablet 2   nicotine (NICODERM CQ - DOSED IN MG/24 HR) 7 mg/24hr patch Place 1 patch (7 mg total) onto the skin daily. 30 patch 1   nicotine polacrilex (NICORETTE) 2 MG gum Take 1 each (2 mg total) by mouth as needed for smoking cessation. 100 tablet 0   ondansetron (ZOFRAN) 4 MG tablet Take 1 tablet (4 mg total) by mouth every 8 (eight) hours as needed for nausea or vomiting. 20 tablet 0   pantoprazole (PROTONIX) 40 MG tablet Take 1 tablet (40 mg total) by mouth daily. 90 tablet 1   polyethylene glycol (MIRALAX / GLYCOLAX) 17 g packet Take 17 g by mouth daily as needed for mild constipation or moderate constipation.       ROS:  As per HPI. Only new symptom is right visual field loss.    Blood pressure (!) 141/102, pulse 88, temperature 98.2 F (36.8 C), resp. rate 16, SpO2 99 %.   General Examination:                                                                                                       Physical Exam  HEENT-  [place]/AT    Lungs- Respirations unlabored Extremities- No edema  Neurological Examination Mental Status: Awake and alert. Oriented x 5. Speech fluent with intact naming and comprehension. [place] accent due to known Foreign Accent Syndrome is noted. Mild dysarthria.  Cranial Nerves: II: PERRL. Right homonymous hemianopsia.  III,IV, VI: No ptosis. EOMI. No nystagmus.  V: Temp sensation decreased on the right.  VII: Smile symmetric VIII: Hearing intact to conversation IX,X: No hoarseness or hypophonia XI: Symmetric XII: Midline tongue  extension Motor: RUE 4/5 with drift RLE 4+/5 LUE and LLE 5/5 Sensory: Temp and light touch intact to BUE and BLE Deep Tendon Reflexes: 2+ and symmetric throughout Cerebellar: No ataxia with FNF or H-S bilaterally Gait: Deferred    Lab Results: Basic Metabolic Panel: No results for input(s): "NA", "K", "CL", "CO2", "GLUCOSE", "BUN", "CREATININE", "CALCIUM", "MG", "PHOS" in the last 168 hours.  CBC: No results for input(s): "WBC", "NEUTROABS", "HGB", "HCT", "MCV", "PLT" in the last 168 hours.  Cardiac Enzymes: No results for input(s): "CKTOTAL", "CKMB", "CKMBINDEX", "TROPONINI" in the last 168 hours.  Lipid Panel: No results for input(s): "CHOL", "TRIG", "HDL", "CHOLHDL", "VLDL", "LDLCALC" in the last 168 hours.  Imaging: No results found.   Assessment: 47 y.o. female with a PMHx of multiple cryptogenic strokes with residual right sided weakness (sees Dr. Pearlean Brownie in follow up) and post-stroke Foreign Accent Syndrome (accent now sounds slightly [place]), DVT of right iliac vein (on Eliquis), HLD and obesity who presents with acute onset of loss of vision in the peripheral right visual fields of both eyes, which was followed by a headache that felt unusual as compared to her usual headaches. She does not have a diagnosis of HTN but states that at her doctor's visit 2 weeks ago, her BP was 150/101. She is on Eliquis BID for secondary stroke prevention. - Exam reveals chronic right sided weakness and new right homonymous hemianopsia. Findings best localize to the left occipital lobe. She also has an accent that sounds similar to [place], which is secondary to Foreign Accent Syndrome from a prior stroke.  - CT head: New hypodensity and loss of gray-white differentiation in the left occipital and parietal lobes, concerning for acute infarct. No acute intracranial hemorrhage. - CTA of head and neck: No intracranial large vessel occlusion or significant stenosis. No hemodynamically significant  stenosis in the neck. - Overall presentation most consistent with recurrent cryptogenic stroke despite ASA and Eliquis.   Recommendations: - Continue home ASA, apixaban and atorvastatin - Will defer to Stroke Team regarding possible further studies. Has had hypercoagulable work up in the past with antiphospholipid IgM 14, slightly elevated, as the only finding.  - HgbA1c, fasting  lipid panel - MRI of the brain without contrast - PT consult, OT consult, Speech consult - Modified permissive HTN x 24 hours. Using modified parameters as she is on anticoagulation. Treat SBP if > 180 - Telemetry monitoring - Frequent neuro checks - NPO until passes stroke swallow screen   Electronically signed: Dr. Caryl Pina 08/21/2022, 5:41 PM

## 2022-08-21 NOTE — ED Provider Triage Note (Signed)
Emergency Medicine Provider Triage Evaluation Note  Maria Porter , a 47 y.o. female  was evaluated in triage.  Pt complains of abrupt peripheral vision loss in the right eye at 1445 this afternoon while in the shower.  States that she has had multiple CVAs in the past and has some residual right upper and lower extremity weakness.  States the weakness in her arm and her leg is at baseline but the vision changes needed.  Denies associated headache, nausea, vomiting, chest pain, or shortness of breath.  Patient has had some visual changes over the last couple of months evaluated by her PCP but says it was not this severe in the vision loss in the periphery is completely new since 1445 this afternoon.  Review of Systems  Positive: See HPI Negative: See HPI  Physical Exam  BP (!) 141/102 (BP Location: Right Arm)   Pulse 88   Temp 98.2 F (36.8 C)   Resp 16   SpO2 99%  Gen:   Awake, no distress   Resp:  Normal effort lungs clear to auscultation MSK:   Able to move all extremities x 4 Other:  Alert and oriented, normal speech, patient unable to discern number of fingers held in any visual field on the right, left visual fields are intact, no facial droop, normal sensation, PERRL, EOMI, 4/5 strength in right upper and lower extremity pt states at her baseline, 5/5 strength in left upper and lower extremity, slight pronator drift on the right arm patient states is at her baseline  Medical Decision Making  Medically screening exam initiated at 5:34 PM.  Appropriate orders placed.  Dreana Britz was informed that the remainder of the evaluation will be completed by another provider, this initial triage assessment does not replace that evaluation, and the importance of remaining in the ED until their evaluation is complete.  Code stroke activated.  Geographical information systems officer aware.   Tonette Lederer, PA-C 08/21/22 1737

## 2022-08-21 NOTE — ED Provider Notes (Signed)
[place] EMERGENCY DEPARTMENT AT [facility] Provider Note   CSN: [id number] Arrival date & time: 08/21/22  1716     History  Chief Complaint  Patient presents with   Loss of Vision    Maria Porter is a 47 y.o. female.  HPI     47 year old female with recurrent cryptogenic CVAs, history of DVTs, hypothyroidism, hyperlipidemia on Eliquis last dose being at 10 AM comes in with chief complaint of sudden onset vision loss.  Patient states that she noticed at 2:45 PM that she was unable to see in the right side of her peripheral vision.  From her prior stroke, she has weakness in the right arm and lower extremity which is at baseline still.  She denies any dizziness.  Slurred speech is also at baseline.  Home Medications Prior to Admission medications   Medication Sig Start Date End Date Taking? Authorizing Provider  albuterol (PROVENTIL) (2.5 MG/3ML) 0.083% nebulizer solution Take 3 mLs (2.5 mg total) by nebulization every 4 (four) hours as needed for wheezing or shortness of breath. 08/17/22 08/17/23  Belva Agee, MD  albuterol (VENTOLIN HFA) 108 (90 Base) MCG/ACT inhaler Inhale 1-2 puffs into the lungs every 6 (six) hours as needed for wheezing or shortness of breath. 08/21/22   Katsadouros, Vasilios, MD  apixaban (ELIQUIS) 5 MG TABS tablet Take 1 tablet (5 mg total) by mouth 2 (two) times daily. 08/09/22   Belva Agee, MD  aspirin EC 81 MG tablet Take 1 tablet (81 mg total) by mouth daily. Swallow whole. 08/09/22 08/09/23  Belva Agee, MD  atorvastatin (LIPITOR) 80 MG tablet Take 1 tablet (80 mg total) by mouth daily. 08/09/22   Katsadouros, Vasilios, MD  cetirizine (ZYRTEC) 10 MG tablet TAKE 1 TABLET BY MOUTH EVERY DAY 01/17/22   Katsadouros, Vasilios, MD  [employer] ACETAMINOPHEN 325 MG tablet TAKE 2 TABLETS BY MOUTH EVERY 6 (SIX) HOURS AS NEEDED FOR MILD PAIN (OR FEVER >/= 101). 01/15/22   Katsadouros, Vasilios, MD  ezetimibe (ZETIA) 10 MG tablet Take 1  tablet (10 mg total) by mouth daily. 08/09/22   Katsadouros, Vasilios, MD  fluticasone (FLONASE) 50 MCG/ACT nasal spray SPRAY 1 SPRAY INTO BOTH NOSTRILS DAILY. 02/06/22   Rocky Morel, DO  levothyroxine (SYNTHROID) 175 MCG tablet Take 1 tablet (175 mcg total) by mouth daily at 6 (six) AM. 08/09/22   Katsadouros, Vasilios, MD  nicotine (NICODERM CQ - DOSED IN MG/24 HR) 7 mg/24hr patch Place 1 patch (7 mg total) onto the skin daily. 08/09/22   Belva Agee, MD  nicotine polacrilex (NICORETTE) 2 MG gum Take 1 each (2 mg total) by mouth as needed for smoking cessation. 08/09/22   Belva Agee, MD  ondansetron (ZOFRAN) 4 MG tablet Take 1 tablet (4 mg total) by mouth every 8 (eight) hours as needed for nausea or vomiting. 11/14/21   Katsadouros, Vasilios, MD  pantoprazole (PROTONIX) 40 MG tablet Take 1 tablet (40 mg total) by mouth daily. 08/09/22   Katsadouros, Vasilios, MD  polyethylene glycol (MIRALAX / GLYCOLAX) 17 g packet Take 17 g by mouth daily as needed for mild constipation or moderate constipation. 09/07/21   [provider]      Allergies    Patient has no known allergies.    Review of Systems   Review of Systems  All other systems reviewed and are negative.   Physical Exam Updated Vital Signs BP (!) 148/94   Pulse 94   Temp 98.2 F (36.8 C)   Resp (!)  23   SpO2 100%  Physical Exam Vitals and nursing note reviewed.  Constitutional:      Appearance: She is well-developed.  HENT:     Head: Normocephalic and atraumatic.  Eyes:     Extraocular Movements: Extraocular movements intact.     Pupils: Pupils are equal, round, and reactive to light.  Cardiovascular:     Rate and Rhythm: Normal rate.  Pulmonary:     Effort: Pulmonary effort is normal.  Musculoskeletal:     Cervical back: Normal range of motion and neck supple.  Skin:    General: Skin is dry.  Neurological:     Mental Status: She is alert and oriented to person, place, and time.     Comments:  Patient has upper and lower extremity weakness on the right side. EOMI. Right hemianopsia noted -binocular     ED Results / Procedures / Treatments   Labs (all labs ordered are listed, but only abnormal results are displayed) Labs Reviewed  COMPREHENSIVE METABOLIC PANEL - Abnormal; Notable for the following components:      Result Value   Glucose, Bld 109 (*)    Total Protein 8.4 (*)    Alkaline Phosphatase 140 (*)    All other components within normal limits  I-STAT CHEM 8, ED - Abnormal; Notable for the following components:   Glucose, Bld 105 (*)    Calcium, Ion 1.10 (*)    Hemoglobin 15.3 (*)    All other components within normal limits  CBG MONITORING, ED - Abnormal; Notable for the following components:   Glucose-Capillary 107 (*)    All other components within normal limits  ETHANOL  PROTIME-INR  APTT  CBC  DIFFERENTIAL  RAPID URINE DRUG SCREEN, HOSP PERFORMED  URINALYSIS, ROUTINE W REFLEX MICROSCOPIC    EKG EKG Interpretation  Date/Time:  Tuesday August 21 2022 17:28:23 EDT Ventricular Rate:  88 PR Interval:  136 QRS Duration: 90 QT Interval:  366 QTC Calculation: 442 R Axis:   68 Text Interpretation: Normal sinus rhythm Inferior infarct , age undetermined Anterolateral infarct , age undetermined Abnormal ECG When compared with ECG of 09-Dec-2021 23:25, PREVIOUS ECG IS PRESENT Confirmed by Derwood Kaplan [phone]) on 08/21/2022 6:45:54 PM  Radiology CT ANGIO HEAD NECK W WO CM  Result Date: 08/21/2022 CLINICAL DATA:  Loss of vision EXAM: CT ANGIOGRAPHY HEAD AND NECK WITH AND WITHOUT CONTRAST TECHNIQUE: Multidetector CT imaging of the head and neck was performed using the standard protocol during bolus administration of intravenous contrast. Multiplanar CT image reconstructions and MIPs were obtained to evaluate the vascular anatomy. Carotid stenosis measurements (when applicable) are obtained utilizing NASCET criteria, using the distal internal carotid diameter as  the denominator. RADIATION DOSE REDUCTION: This exam was performed according to the departmental dose-optimization program which includes automated exposure control, adjustment of the mA and/or kV according to patient size and/or use of iterative reconstruction technique. CONTRAST:  75 mL Omnipaque 350 COMPARISON:  12/10/2021 CTA head neck FINDINGS: CT HEAD FINDINGS For noncontrast findings, please see same day CT head. CTA NECK FINDINGS Aortic arch: Two-vessel arch with a common origin of the brachiocephalic and left common carotid arteries. Imaged portion shows no evidence of aneurysm or dissection. No significant stenosis of the major arch vessel origins. Right carotid system: No evidence of dissection, occlusion, or hemodynamically significant stenosis (greater than 50%). Left carotid system: No evidence of dissection, occlusion, or hemodynamically significant stenosis (greater than 50%). Vertebral arteries: Right dominant system. No evidence of dissection, occlusion, or  hemodynamically significant stenosis (greater than 50%). Skeleton: No acute osseous abnormality. Degenerative changes in the cervical spine. Other neck: Negative. Upper chest: No focal pulmonary opacity or pleural effusion. Review of the MIP images confirms the above findings CTA HEAD FINDINGS Anterior circulation: Both internal carotid arteries are patent to the termini, without significant stenosis. A1 segments patent. Normal anterior communicating artery. Anterior cerebral arteries are patent to their distal aspects without significant stenosis. No M1 stenosis or occlusion. MCA branches perfused to their distal aspects without significant stenosis. Posterior circulation: Vertebral arteries patent to the vertebrobasilar junction without significant stenosis. Posterior inferior cerebellar arteries patent proximally. Basilar patent to its distal aspect without significant stenosis. Superior cerebellar arteries patent proximally. Patent right P1.  Fetal origin of the left PCA from the left posterior communicating artery. PCAs perfused to their distal aspects without significant stenosis. The bilateral posterior communicating arteries are not visualized. Venous sinuses: As permitted by contrast timing, patent. Anatomic variants: Fetal origin of the left PCA. Review of the MIP images confirms the above findings IMPRESSION: 1. No intracranial large vessel occlusion or significant stenosis. 2. No hemodynamically significant stenosis in the neck. Imaging results were communicated on 08/21/2022 at 6:13 pm to provider Dr. Otelia Limes via secure text paging. Electronically Signed   By: Wiliam Ke M.D.   On: 08/21/2022 18:13   CT HEAD CODE STROKE WO CONTRAST  Result Date: 08/21/2022 CLINICAL DATA:  Code stroke.  Vision loss EXAM: CT HEAD WITHOUT CONTRAST TECHNIQUE: Contiguous axial images were obtained from the base of the skull through the vertex without intravenous contrast. RADIATION DOSE REDUCTION: This exam was performed according to the departmental dose-optimization program which includes automated exposure control, adjustment of the mA and/or kV according to patient size and/or use of iterative reconstruction technique. COMPARISON:  12/09/2021 CTA head and neck FINDINGS: Brain: New hypodensity and loss of gray-white differentiation in the left occipital and parietal lobe (series 3, images 20-23 and series 6, image 36-42). Redemonstrated hypodensity in the left anterior frontal lobe and right cerebellum. No evidence of acute hemorrhage, mass, mass effect, or midline shift. No hydrocephalus or extra-axial collection. Vascular: No hyperdense vessel. Skull: Negative for fracture or focal lesion. Sinuses/Orbits: No acute finding. Other: The mastoid air cells are well aerated. ASPECTS (Alberta Stroke Program Early CT Score) - Ganglionic level infarction (caudate, lentiform nuclei, internal capsule, insula, M1-M3 cortex): 7 - Supraganglionic infarction (M4-M6  cortex): 3 Total score (0-10 with 10 being normal): 10 IMPRESSION: New hypodensity and loss of gray-white differentiation in the left occipital and parietal lobes, concerning for acute infarct. No acute intracranial hemorrhage. Imaging results were communicated on 08/21/2022 at 5:55 pm to provider Dr. Otelia Limes via secure text paging. Electronically Signed   By: Wiliam Ke M.D.   On: 08/21/2022 17:57    Procedures .Critical Care  Performed by: Derwood Kaplan, MD Authorized by: Derwood Kaplan, MD   Critical care provider statement:    Critical care time (minutes):  34   Critical care was necessary to treat or prevent imminent or life-threatening deterioration of the following conditions:  Circulatory failure and CNS failure or compromise   Critical care was time spent personally by me on the following activities:  Development of treatment plan with patient or surrogate, discussions with consultants, evaluation of patient's response to treatment, examination of patient, ordering and review of laboratory studies, ordering and review of radiographic studies, ordering and performing treatments and interventions, pulse oximetry, re-evaluation of patient's condition and review of old charts  Medications Ordered in ED Medications  iohexol (OMNIPAQUE) 350 MG/ML injection 75 mL (75 mLs Intravenous Contrast Given 08/21/22 1804)    ED Course/ Medical Decision Making/ A&P                             Medical Decision Making Risk Decision regarding hospitalization.   This patient presents to the ED with chief complaint(s) of right-sided hemianopsia, last known normal 2:45 PM with pertinent past medical history of acute ischemic stroke, DVT and metabolic syndrome on anticoagulation.The complaint involves an extensive differential diagnosis and also carries with it a high risk of complications and morbidity.    The differential diagnosis includes : Acute ischemic stroke, acute embolic stroke, acute  brain bleed.  Code stroke was activated in triage because patient is well within treatment window.  However patient is on Eliquis, therefore she is not a candidate for TNK.   Additional history obtained: Records reviewed previous admission documents  Independent labs interpretation:  The following labs were independently interpreted: Patient CBC, metabolic profile are normal and reassuring  Independent visualization and interpretation of imaging: - I independently visualized the following imaging with scope of interpretation limited to determining acute life threatening conditions related to emergency care: CT scan of the brain, which revealed no evidence of brain bleed  Treatment and Reassessment: Neurology team has added CT angiogram.  They recommend admission to the hospital.  Blood pressure is stable and within normal limits.  Final Clinical Impression(s) / ED Diagnoses Final diagnoses:  Acute ischemic stroke    Rx / DC Orders ED Discharge Orders     None         Derwood Kaplan, MD 08/21/22 [place]

## 2022-08-21 NOTE — H&P (Cosign Needed Addendum)
Date: 08/21/2022               Patient Name:  Maria Porter MRN: [id number]  DOB: 1975-06-07 Age / Sex: 47 y.o., female   PCP: Belva Agee, MD         Medical Service: Internal Medicine Teaching Service         Attending Physician: Dr. Ginnie Smart, MD      First Contact: Dr. Lajuana Ripple, MD Pager [phone]    Second Contact: Dr. Champ Mungo, DO Pager [phone]         After Hours (After 5p/  First Contact Pager: [phone]  weekends / holidays): Second Contact Pager: [phone]   SUBJECTIVE   Chief Complaint: Right visual field loss, acute  History of Present Illness:  Tikisha Molinaro is a 47 y/o female with history of recurrent cryptogenic strokes with residual right-sided weakness and foreign accent syndrome, DVT of right iliac vein in 2022 on Eliquis, hypothyroidism, HLD, and tobacco use disorder who presents with acute loss of peripheral vision on the right in both eyes.  She was in her normal state of health before taking a shower today at about 2:45 PM and then when trying off she noticed that she cannot see to her right.  She denies any other acute symptoms including other focal weakness, syncope, presyncope, dysphagia, change in speech, or facial droop.  She has ambulated with her cane since her vision changes without ataxia, falls, or difficulty.  She has not run out of any of her medication has been taking her Eliquis twice a day, last dose this morning at 10 AM.  Otherwise she has resumed smoking about 5 cigarettes a day which is down from 1 pack/day but she had quit for 2 months and resumed about 2 months ago after her daughter moved out.  She denies any changes in her normal life until her acute vision changes.  She does have intermittent orthostatic dizziness but has never had syncope or fall.  Denies any joint pain, joint swelling, rashes, or pruritus.  10/2016: Left cerebellar CVA -Hypercoagulable workup: negative Lupus, factor V, Pro C 01/2020: Left frontal  cortical CVA. TEE small intra-atrial shunt -Hypercoagulable workup: negative RPR, VDRL, Lupus, prothrombin, factor V, Pro C/S, antithrombin 3, ANA, ANCA.  Mildly elevated antiphospholipid IgM (14) was only abnormality 09/2020: Left frontal/caudate/ putamen + Right frontal CVA -S/p mechanical thrombectomy of Left MCA -Acute DVT: start Eliquis 5 mg BID 09/02/2021: Right cerebellar CVA. Right superior cerebellar artery occlusion -Patient stopped Eliquis 1 month prior to this admission 09/29/2021: Resume Eliquis at [facility]. Patient did not follow up with Hematology 11/27/2021: Stop eliquis.  Negative lower extremity venous Dopplers. 12/09/2021: Small acute cortical infarcts right MCA - TTE W/bubble without intra-atrial shunt - resumed Eliquis  - hypercoagulable workup with no abnormalities (antiphospholipid, Antithrombin III, protein C/S, beta-2 glycoprotein, homocystine, factor V Leiden, prothrombin gene)  She also notes 3 weeks of intermittent left anterior thigh pain when sleeping that is sharp and made slightly better by elevating her foot slightly.  Denies lower extremity edema, pain below the knee or posterior thigh pain.  Denies radicular pain and cramping.  Tylenol did not have any effect.  No orthopnea.  She does have bilateral anterior lateral nontender lumps on her ankles that have been present for a few months without history of trauma and no other associated symptoms such as decreased range of motion, functional limitation, pain with ambulation, overlying erythema, or worsening swelling.  ED Course: Seen in ED  for vision changes as above.  Hemodynamically stable and afebrile on arrival.  Code stroke called and CT head showed evidence of acute infarcts in the left occipital and parietal lobes without hemorrhage.  CTA of the head and neck without large vessel occlusion or significant stenosis in the head or neck.  IMTS paged for admission for acute ischemic stroke.  Past Medical History Recurrent  cryptogenic strokes with residual right-sided weakness and foreign accent syndrome DVT of right iliac vein in 2022 on Eliquis Hyperlipidemia Tobacco use disorder  Meds:  Albuterol inhaler as needed Eliquis 5 mg twice daily Aspirin 81 mg daily Atorvastatin 80 mg daily Ezetimibe 10 mg daily Levothyroxine 175 mcg daily Pantoprazole 40 mg daily  Past Surgical History Past Surgical History:  Procedure Laterality Date   BUBBLE STUDY  01/29/2020   Procedure: BUBBLE STUDY;  Surgeon: Little Ishikawa, MD;  Location: [facility] ENDOSCOPY;  Service: Cardiovascular;;   TEE WITHOUT CARDIOVERSION N/A 01/29/2020   Procedure: TRANSESOPHAGEAL ECHOCARDIOGRAM (TEE);  Surgeon: Little Ishikawa, MD;  Location: [facility] ENDOSCOPY;  Service: Cardiovascular;  Laterality: N/A;    Social:  Lives in [place] alone Support: Family and area, son can come over to help if needed Level of Function: Independent in ADLs and IADLs, uses a cane PCP: Belva Agee, MD Substances: Prior 1 pack/day smoker for 33 years, quit for 2 months but resumed 2 months ago with 5 cigarettes/day.  No significant alcohol use.  Prior cocaine use.  Family History:  Heart attacks at age 62 and 61 in mother and father.  Allergies: Allergies as of 08/21/2022   (No Known Allergies)    Review of Systems: A complete ROS was negative except as per HPI.   OBJECTIVE:   Physical Exam: Blood pressure 121/85, pulse 82, temperature 98.2 F (36.8 C), resp. rate 20, SpO2 95 %.  Constitutional: Middle-age female laying in bed, appears comfortable. In no acute distress. HENT: Normocephalic, atraumatic,  Eyes: Sclera non-icteric, PERRL, EOM intact Cardio:Regular rate and rhythm. No murmurs, rubs, or gallops. 2+ bilateral radial and dorsalis pedis  pulses. Pulm:Clear to auscultation bilaterally. Normal work of breathing on room air. Abdomen: Soft, non-tender, non-distended, positive bowel sounds. WUJ:[id number] for extremity  edema. Skin:Warm and dry. Neuro:Alert and oriented x3.  Stable 4/5 strength in the right upper and lower extremity, 5/5 strength in the left upper and lower extremity.  Mild decreased sensation on the right face, upper extremity, and lower extremity.  Right homonymous hemianopsia with loss of right visual field in bilateral eyes.  Psych:Pleasant mood and affect.  Labs: CBC    Component Value Date/Time   WBC 7.7 08/21/2022 1751   RBC 4.98 08/21/2022 1751   HGB 15.3 (H) 08/21/2022 1755   HGB 14.2 02/03/2020 1030   HCT 45.0 08/21/2022 1755   HCT 43.4 02/03/2020 1030   PLT 399 08/21/2022 1751   PLT 307 02/03/2020 1030   MCV 86.5 08/21/2022 1751   MCV 90 02/03/2020 1030   MCH 28.3 08/21/2022 1751   MCHC 32.7 08/21/2022 1751   RDW 13.0 08/21/2022 1751   RDW 12.3 02/03/2020 1030   LYMPHSABS 3.0 08/21/2022 1751   MONOABS 0.4 08/21/2022 1751   EOSABS 0.2 08/21/2022 1751   BASOSABS 0.0 08/21/2022 1751     CMP     Component Value Date/Time   NA 139 08/21/2022 1755   NA 141 05/23/2021 1013   K 3.9 08/21/2022 1755   CL 104 08/21/2022 1755   CO2 24 08/21/2022 1751   GLUCOSE 105 (  H) 08/21/2022 1755   BUN 8 08/21/2022 1755   BUN 9 05/23/2021 1013   CREATININE 0.70 08/21/2022 1755   CALCIUM 9.6 08/21/2022 1751   PROT 8.4 (H) 08/21/2022 1751   PROT 7.6 02/03/2020 1030   ALBUMIN 3.9 08/21/2022 1751   ALBUMIN 4.3 02/03/2020 1030   AST 27 08/21/2022 1751   ALT 35 08/21/2022 1751   ALKPHOS 140 (H) 08/21/2022 1751   BILITOT 0.4 08/21/2022 1751   BILITOT <0.2 02/03/2020 1030   GFRNONAA >60 08/21/2022 1751   GFRAA 98 02/03/2020 1030    Imaging: CT ANGIO HEAD NECK W WO CM Result Date: 08/21/2022 IMPRESSION: 1. No intracranial large vessel occlusion or significant stenosis. 2. No hemodynamically significant stenosis in the neck.  Electronically Signed   By: Wiliam Ke M.D.   On: 08/21/2022 18:13   CT HEAD CODE STROKE WO CONTRAST Result Date: 08/21/2022 IMPRESSION: New hypodensity  and loss of gray-white differentiation in the left occipital and parietal lobes, concerning for acute infarct. No acute intracranial hemorrhage.  Electronically Signed   By: Wiliam Ke M.D.   On: 08/21/2022 17:57     EKG: personally reviewed my interpretation is normal sinus rhythm. Consistent with prior EKG.  ASSESSMENT & PLAN:   Assessment & Plan by Problem: Principal Problem:   Acute CVA (cerebrovascular accident) Active Problems:   Tobacco use disorder   Hyperlipemia   Subclinical hypothyroidism   History of DVT (deep vein thrombosis)   Maria Porter is a 47 y.o. 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  Acute ischemic stroke left occipital and parietal lobe, presumed recurrent cryptogenic Recurrent cryptogenic strokes with residual right-sided weakness and foreign accent syndrome History of DVT in the right iliac vein in 2022 on Eliquis Imaging and symptoms consistent with left occipital/parietal infarct causing homonymous hemianopsia with loss of right visual field.  Neurology seen and evaluated the patient, appreciate their recommendations.  Has had extensive workup outlined in HPI.  In summary she has had arterial and venous clots with negative hypercoagulable workups, possible PFO on prior TEE not demonstrated on most recent TTE 12/2021, negative DVT study in 11/2021 after right iliac vein DVT in 2022, and multiple negative cardiac monitors as well as TEE without evidence of cardioembolic cause who continues to have recurrent strokes.  History consistent with cryptogenic strokes possibly embolic strokes of undetermined source (ESUS).  With her multiple recurrences despite  anticoagulation with Eliquis, antiplatelet with aspirin, LDL less than 70, and no significant hypertensive history we will further evaluate and repeat some previous studies.  We cannot repeat all hypercoagulable labs due to current use of Eliquis.  She will also need age-appropriate cancer screening with colonoscopy and likely a CT scan of her chest given her smoking history.  Discussed anticoagulation with Dr. Otelia Limes who did not recommend switching to heparin tonight due to the high risk of hemorrhagic transformation. - Continue aspirin, Eliquis, atorvastatin, and ezetimibe - Check A1c, lipids, ANCA, complement, CRP, sed rate, ANA with reflex - MRI brain without contrast - TTE with bubble - Lower extremity venous Dopplers - Transcranial Doppler - Permissive hypertension for 24 hours, page on-call resident for SBP over 180 - PT, OT, SLP (n.p.o. until passes bedside swallow) - Frequent neurochecks  Left anterior thigh pain Vague and intermittent pain at night could be related to DVT so we will evaluate with lower extremity venous Dopplers as above to assess for DVT while on Eliquis.  Tobacco use disorder Significant smoking history of 30+ years with recent 10-month abstinence and then resumed for past 2 months at decreased to 5 cigarettes a day from 1 pack/day.  Has tried nicotine patches with some success.  Seems like after her daughter left she began smoking again but denies any withdrawal symptoms, feels more like she likes the action of smoking and not feeling it gives her.  Is interested in quitting and would try patches again as well as gum.  She understands that smoking does put her at a higher risk for strokes.  She is also not had any lung imaging so we will get a chest x-ray but she may need lung cancer screening outpatient in addition to age-appropriate cancer screening. - Chest x-ray - Nicotine patch  Hypothyroidism TSH in 12/2021 was 17.28.  Has been on levothyroxine 175 mcg daily  without signs or symptoms of clinical hypothyroidism or hyperthyroidism. - Repeat TSH and continue home levothyroxine 175 mcg daily  Hx of OSA She has had a diagnosis of OSA in the past but sleep study from 10/2021 did not show any significant obstructive or central sleep disordered breathing. Does not use CPAP.   Diet: NPO until passes swallow eval VTE: DOAC IVF: None Code: DNR  Dispo: Admit patient to Inpatient with expected length of stay greater than 2 midnights.  Signed: Rocky Morel, DO Internal Medicine Resident PGY-1  08/21/2022, 10:01 PM   Dr. Lajuana Ripple, MD Pager [phone]

## 2022-08-21 NOTE — Code Documentation (Addendum)
[name] is a 47 yr old female presenting to [facility] via [facility] on 08/21/2022 for acute onset of rt hemianopia. Pt has a history of prior CVA one year ago, and DVT. Baseline right sided weakness from prior CVA.Marland Kitchen Today at 1445, she suddenly lost the rt half of her visual field in both eyes. Pt takes Eliquis.    Code stroke  ACTIVATED IN triage. Pt met by stroke team in CT. NIHSS 5. Pt with rt hemianopia, and rt arm and leg drift, as well as dysarthria. The following imaging was completed: CT, CTA. Per Dr. Otelia Limes, CT neg for hemorrhage and CTA negative for LVO. Pt not eligible for thrombolytic due to Eliquis. She is not a candidate for thrombectomy due to LVO neg.     Pt returned to ED Trauma C where her workup will continue. She will need q 2 hr VS and NIHSS. Bedside handoff with Alena RN complete.

## 2022-08-21 NOTE — Hospital Course (Addendum)
4/19: no changes today including to vision. Is tired, not sleeping well in hospital.  4/17: says she's doing a lot better than she was yesterday. Headache is better. Still has no sight but says it's somewhat improved. Can see looking straight but not periphery. Sensation colder midface on L. Trouble with lower nasal field vision on L, peripheral on R. Takes xarelto daily as supposed to.  Albuterol, ASA, atorvastatin, zetia, levothyroxine, protonix  -2:45 PM, after shower, loss R. Peripheral visual field, hemianopsia -No new facial droop -No new slurred speech, no dysphagia, No N/V -Chronic R. Sided weakness, using cane for ambulation.   -No new meds or running out of meds. Adherence to Eliquis and ASA.   -No falls or syncope. No CP or SOB. No fever, dysuria. No joint pain, rash  -Left anterior thigh pain when sleeping for 3 weeks. No trauma. No numbness and tingling. Tylenol didn't help. No shooting pain. Has to elevate her left leg to sleep. Does not happen everyday. No edema.   -Left ankle bump x 1 month.   -No orthopnea.  -? OSA  Social -lives by herself -Independent w ADLs -Smoke 5 ciga/d -Drink intermittently.  -Remote cocaine use. No IV drugs  Family History  Problem Relation Age of Onset   Sleep apnea Neg Hx    No fam hx of CVA or blood clot Hx of heart att on both sides: mom 45, dad 28

## 2022-08-22 ENCOUNTER — Inpatient Hospital Stay (HOSPITAL_COMMUNITY): Payer: Medicare HMO

## 2022-08-22 ENCOUNTER — Encounter (HOSPITAL_COMMUNITY): Payer: Self-pay | Admitting: Infectious Diseases

## 2022-08-22 DIAGNOSIS — D6859 Other primary thrombophilia: Secondary | ICD-10-CM | POA: Diagnosis not present

## 2022-08-22 DIAGNOSIS — M79606 Pain in leg, unspecified: Secondary | ICD-10-CM

## 2022-08-22 DIAGNOSIS — H5340 Unspecified visual field defects: Secondary | ICD-10-CM | POA: Diagnosis not present

## 2022-08-22 DIAGNOSIS — I639 Cerebral infarction, unspecified: Secondary | ICD-10-CM | POA: Diagnosis not present

## 2022-08-22 DIAGNOSIS — Z86718 Personal history of other venous thrombosis and embolism: Secondary | ICD-10-CM

## 2022-08-22 DIAGNOSIS — I6389 Other cerebral infarction: Secondary | ICD-10-CM | POA: Diagnosis not present

## 2022-08-22 DIAGNOSIS — Z7901 Long term (current) use of anticoagulants: Secondary | ICD-10-CM

## 2022-08-22 LAB — ECHOCARDIOGRAM COMPLETE BUBBLE STUDY
AR max vel: 1.69 cm2
AV Area VTI: 1.7 cm2
AV Area mean vel: 1.69 cm2
AV Mean grad: 4 mmHg
AV Peak grad: 7.5 mmHg
Ao pk vel: 1.37 m/s
Area-P 1/2: 3.6 cm2
S' Lateral: 1.9 cm

## 2022-08-22 LAB — BASIC METABOLIC PANEL
Anion gap: 9 (ref 5–15)
BUN: 5 mg/dL — ABNORMAL LOW (ref 6–20)
CO2: 24 mmol/L (ref 22–32)
Calcium: 9.3 mg/dL (ref 8.9–10.3)
Chloride: 103 mmol/L (ref 98–111)
Creatinine, Ser: 0.81 mg/dL (ref 0.44–1.00)
GFR, Estimated: >60 mL/min (ref >60)
Glucose, Bld: 127 mg/dL — ABNORMAL HIGH (ref 70–99)
Potassium: 4 mmol/L (ref 3.5–5.1)
Sodium: 136 mmol/L (ref 135–145)

## 2022-08-22 LAB — LIPID PANEL
Cholesterol: 114 mg/dL (ref 0–200)
HDL: 42 mg/dL (ref >40)
LDL Cholesterol: 63 mg/dL (ref 0–99)
Total CHOL/HDL Ratio: 2.7 RATIO
Triglycerides: 47 mg/dL (ref <150)
VLDL: 9 mg/dL (ref 0–40)

## 2022-08-22 LAB — CBC
HCT: 39.4 % (ref 36.0–46.0)
Hemoglobin: 13.3 g/dL (ref 12.0–15.0)
MCH: 28.6 pg (ref 26.0–34.0)
MCHC: 33.8 g/dL (ref 30.0–36.0)
MCV: 84.7 fL (ref 80.0–100.0)
Platelets: 410 10*3/uL — ABNORMAL HIGH (ref 150–400)
RBC: 4.65 MIL/uL (ref 3.87–5.11)
RDW: 13 % (ref 11.5–15.5)
WBC: 8.6 10*3/uL (ref 4.0–10.5)
nRBC: 0 % (ref 0.0–0.2)

## 2022-08-22 LAB — HEMOGLOBIN A1C
Hgb A1c MFr Bld: 6.3 % — ABNORMAL HIGH (ref 4.8–5.6)
Mean Plasma Glucose: 134.11 mg/dL

## 2022-08-22 LAB — TSH: TSH: 20.934 u[IU]/mL — ABNORMAL HIGH (ref 0.350–4.500)

## 2022-08-22 LAB — T4, FREE: Free T4: 0.78 ng/dL (ref 0.61–1.12)

## 2022-08-22 LAB — SEDIMENTATION RATE: Sed Rate: 36 mm/hr — ABNORMAL HIGH (ref 0–22)

## 2022-08-22 LAB — C-REACTIVE PROTEIN: CRP: 1.6 mg/dL — ABNORMAL HIGH (ref <1.0)

## 2022-08-22 MED ORDER — ASPIRIN 325 MG PO TABS
325.0000 mg | ORAL_TABLET | Freq: Every day | ORAL | Status: DC
  Administered 2022-08-23: 325 mg via ORAL
  Filled 2022-08-22: qty 1

## 2022-08-22 MED ORDER — WARFARIN - PHARMACIST DOSING INPATIENT: Freq: Every day | Status: DC

## 2022-08-22 MED ORDER — WARFARIN SODIUM 5 MG PO TABS
10.0000 mg | ORAL_TABLET | Freq: Once | ORAL | Status: AC
  Administered 2022-08-22: 10 mg via ORAL
  Filled 2022-08-22: qty 2
  Filled 2022-08-22: qty 1

## 2022-08-22 NOTE — Evaluation (Signed)
Physical Therapy Evaluation Patient Details Name: Maria Porter MRN: [id number] DOB: 08/26/1975 Today's Date: 08/22/2022  History of Present Illness  47 y.o. female presents to [facility] hospital on 08/21/2022 with acute loss of vision on R side. CT shows evidence of acute infarcts in L occipital and parietal lobes. PMH includes recurrent cryptogenic strokes with R residual weakness, foreign accent syndrome, DVT, hypothyroidism, HLD.  Clinical Impression  Pt presents to PT at or near her baseline with the exception of R visual field cut. Pt reports no other deficits at this time and demonstrates good awareness. Pt is able to ambulate with her small based quad cane and reports mobility quality to be at baseline. PT provides education on the need for frequent scanning to R side in an effort to improve safety. PT recommends discharge home when medically stable. No further PT needs identified.       Recommendations for follow up therapy are one component of a multi-disciplinary discharge planning process, led by the attending physician.  Recommendations may be updated based on patient status, additional functional criteria and insurance authorization.  Follow Up Recommendations       Assistance Recommended at Discharge PRN  Patient can return home with the following  A little help with bathing/dressing/bathroom;Assistance with cooking/housework;Assist for transportation;Help with stairs or ramp for entrance    Equipment Recommendations None recommended by PT  Recommendations for Other Services       Functional Status Assessment Patient has not had a recent decline in their functional status (mobility is at baseline, pt denies symptoms other than visual field cut)     Precautions / Restrictions Precautions Precautions: Fall Precaution Comments: chronic R weakness, R visual field cut Restrictions Weight Bearing Restrictions: No      Mobility  Bed Mobility Overal bed mobility: Modified  Independent             General bed mobility comments: sit to supine    Transfers Overall transfer level: Modified independent Equipment used: Quad cane                    Ambulation/Gait Ambulation/Gait assistance: Modified independent (Device/Increase time) Gait Distance (Feet): 250 Feet Assistive device: Quad cane Gait Pattern/deviations: Step-through pattern Gait velocity: functional Gait velocity interpretation: 1.31 - 2.62 ft/sec, indicative of limited community ambulator   General Gait Details: slowed step-through gait, reduced stance time on RLE, increased leftward trunk lean  Stairs            Wheelchair Mobility    Modified Rankin (Stroke Patients Only) Modified Rankin (Stroke Patients Only) Pre-Morbid Rankin Score: Slight disability Modified Rankin: Slight disability     Balance Overall balance assessment: Needs assistance Sitting-balance support: No upper extremity supported, Feet supported Sitting balance-Leahy Scale: Good     Standing balance support: Single extremity supported, Reliant on assistive device for balance Standing balance-Leahy Scale: Poor                               Pertinent Vitals/Pain Pain Assessment Pain Assessment: No/denies pain    Home Living Family/patient expects to be discharged to:: Private residence Living Arrangements: Alone Available Help at Discharge: Family;Available PRN/intermittently Type of Home: Apartment Home Access: Level entry       Home Layout: One level Home Equipment: Cane - quad Additional Comments: pt plans to move to her daughters home soon after discharge    Prior Function Prior Level of Function : Independent/Modified  Independent;Driving             Mobility Comments: pt ambulates with use of SBQC, driving prior to admission       Hand Dominance        Extremity/Trunk Assessment   Upper Extremity Assessment Upper Extremity Assessment: RUE  deficits/detail RUE Deficits / Details: chronic RUE weakness from prior CVA, appears to be at baseline per pt report    Lower Extremity Assessment Lower Extremity Assessment: RLE deficits/detail RLE Deficits / Details: grossly 4/5 this session, pt reports RLE is at baseline    Cervical / Trunk Assessment Cervical / Trunk Assessment: Normal  Communication   Communication: Expressive difficulties  Cognition Arousal/Alertness: Awake/alert Behavior During Therapy: WFL for tasks assessed/performed Overall Cognitive Status: Within Functional Limits for tasks assessed                                          General Comments General comments (skin integrity, edema, etc.): VSS on RA, pt with R visual field cut    Exercises     Assessment/Plan    PT Assessment Patient does not need any further PT services  PT Problem List         PT Treatment Interventions      PT Goals (Current goals can be found in the Care Plan section)       Frequency       Co-evaluation               AM-PAC PT "6 Clicks" Mobility  Outcome Measure Help needed turning from your back to your side while in a flat bed without using bedrails?: None Help needed moving from lying on your back to sitting on the side of a flat bed without using bedrails?: None Help needed moving to and from a bed to a chair (including a wheelchair)?: None Help needed standing up from a chair using your arms (e.g., wheelchair or bedside chair)?: None Help needed to walk in hospital room?: None Help needed climbing 3-5 steps with a railing? : A Little 6 Click Score: 23    End of Session   Activity Tolerance: Patient tolerated treatment well Patient left: in bed;with call bell/phone within reach Nurse Communication: Mobility status PT Visit Diagnosis: Other symptoms and signs involving the nervous system (R29.898)    Time: 1610-9604 PT Time Calculation (min) (ACUTE ONLY): 15 min   Charges:   PT  Evaluation $PT Eval Low Complexity: 1 Low          Arlyss Gandy, PT, DPT Acute Rehabilitation Office [phone]   Arlyss Gandy 08/22/2022, 1:19 PM

## 2022-08-22 NOTE — Progress Notes (Signed)
Lower extremity venous bilateral study completed.   Please see CV Proc for preliminary results.   Naliyah Neth, RDMS, RVT  

## 2022-08-22 NOTE — Progress Notes (Addendum)
STROKE TEAM PROGRESS NOTE   SUBJECTIVE (INTERVAL HISTORY) No family is at the bedside.  Patient still has right hemianopia, however headache is gone.  She stated that she compliant with Eliquis 5 mg twice daily at home.  Discussed with her regarding Coumadin and she is in agreement.   OBJECTIVE Temp:  [98.2 F (36.8 C)-98.3 F (36.8 C)] 98.3 F (36.8 C) (04/17 0914) Pulse Rate:  [59-97] 93 (04/17 1209) Cardiac Rhythm: Normal sinus rhythm (04/16 1823) Resp:  [16-31] 20 (04/17 1209) BP: (112-152)/(63-106) 140/92 (04/17 1209) SpO2:  [95 %-100 %] 95 % (04/17 1209)  Recent Labs  Lab 08/21/22 1740  GLUCAP 107*   Recent Labs  Lab 08/21/22 1751 08/21/22 1755 08/22/22 0520  NA 138 139 136  K 3.8 3.9 4.0  CL 102 104 103  CO2 24  --  24  GLUCOSE 109* 105* 127*  BUN 8 8 5*  CREATININE 0.93 0.70 0.81  CALCIUM 9.6  --  9.3   Recent Labs  Lab 08/21/22 1751  AST 27  ALT 35  ALKPHOS 140*  BILITOT 0.4  PROT 8.4*  ALBUMIN 3.9   Recent Labs  Lab 08/21/22 1751 08/21/22 1755 08/22/22 0520  WBC 7.7  --  8.6  NEUTROABS 4.0  --   --   HGB 14.1 15.3* 13.3  HCT 43.1 45.0 39.4  MCV 86.5  --  84.7  PLT 399  --  410*   No results for input(s): "CKTOTAL", "CKMB", "CKMBINDEX", "TROPONINI" in the last 168 hours. Recent Labs    08/21/22 1751  LABPROT 14.0  INR 1.1   Recent Labs    08/21/22 2057  COLORURINE YELLOW  LABSPEC >1.046*  PHURINE 5.0  GLUCOSEU NEGATIVE  HGBUR NEGATIVE  BILIRUBINUR NEGATIVE  KETONESUR NEGATIVE  PROTEINUR NEGATIVE  NITRITE NEGATIVE  LEUKOCYTESUR NEGATIVE       Component Value Date/Time   CHOL 114 08/22/2022 0520   TRIG 47 08/22/2022 0520   HDL 42 08/22/2022 0520   CHOLHDL 2.7 08/22/2022 0520   VLDL 9 08/22/2022 0520   LDLCALC 63 08/22/2022 0520   Lab Results  Component Value Date   HGBA1C 6.3 (H) 08/22/2022      Component Value Date/Time   LABOPIA NONE DETECTED 08/21/2022 2057   COCAINSCRNUR NONE DETECTED 08/21/2022 2057   LABBENZ  NONE DETECTED 08/21/2022 2057   AMPHETMU NONE DETECTED 08/21/2022 2057   THCU NONE DETECTED 08/21/2022 2057   LABBARB NONE DETECTED 08/21/2022 2057    Recent Labs  Lab 08/21/22 1751  ETH <10    I have personally reviewed the radiological images below and agree with the radiology interpretations.  MR BRAIN WO CONTRAST  Result Date: 08/21/2022 CLINICAL DATA:  Stroke follow-up EXAM: MRI HEAD WITHOUT CONTRAST TECHNIQUE: Multiplanar, multiecho pulse sequences of the brain and surrounding structures were obtained without intravenous contrast. COMPARISON:  None Available. FINDINGS: Brain: Cortical ischemia throughout the left occipital lobe. Multifocal chronic microhemorrhage in subarachnoid siderosis. No acute hemorrhage. Old infarcts of the right cerebellum and left frontal lobe. There is multifocal hyperintense T2-weighted signal within the white matter. Generalized volume loss. The midline structures are normal. Vascular: Major flow voids are preserved. Skull and upper cervical spine: Normal calvarium and skull base. Visualized upper cervical spine and soft tissues are normal. Sinuses/Orbits:No paranasal sinus fluid levels or advanced mucosal thickening. No mastoid or middle ear effusion. Normal orbits. IMPRESSION: 1. Acute/early subacute cortical ischemia throughout the left occipital lobe. No acute hemorrhage or mass effect. 2. Multifocal chronic microhemorrhage  and subarachnoid siderosis. 3. Old infarcts of the right cerebellum and left frontal lobe. Electronically Signed   By: Deatra Robinson M.D.   On: 08/21/2022 22:34   DG CHEST PORT 1 VIEW  Result Date: 08/21/2022 CLINICAL DATA:  Loss of vision, acute CVA EXAM: PORTABLE CHEST 1 VIEW COMPARISON:  None Available. FINDINGS: Hazy opacity over the left lower lung is favored projectional due to soft tissue overlap. No focal consolidation, pleural effusion, or pneumothorax. Normal cardiomediastinal silhouette. IMPRESSION: No active disease.  Electronically Signed   By: Minerva Fester M.D.   On: 08/21/2022 21:04   CT ANGIO HEAD NECK W WO CM  Result Date: 08/21/2022 CLINICAL DATA:  Loss of vision EXAM: CT ANGIOGRAPHY HEAD AND NECK WITH AND WITHOUT CONTRAST TECHNIQUE: Multidetector CT imaging of the head and neck was performed using the standard protocol during bolus administration of intravenous contrast. Multiplanar CT image reconstructions and MIPs were obtained to evaluate the vascular anatomy. Carotid stenosis measurements (when applicable) are obtained utilizing NASCET criteria, using the distal internal carotid diameter as the denominator. RADIATION DOSE REDUCTION: This exam was performed according to the departmental dose-optimization program which includes automated exposure control, adjustment of the mA and/or kV according to patient size and/or use of iterative reconstruction technique. CONTRAST:  75 mL Omnipaque 350 COMPARISON:  12/10/2021 CTA head neck FINDINGS: CT HEAD FINDINGS For noncontrast findings, please see same day CT head. CTA NECK FINDINGS Aortic arch: Two-vessel arch with a common origin of the brachiocephalic and left common carotid arteries. Imaged portion shows no evidence of aneurysm or dissection. No significant stenosis of the major arch vessel origins. Right carotid system: No evidence of dissection, occlusion, or hemodynamically significant stenosis (greater than 50%). Left carotid system: No evidence of dissection, occlusion, or hemodynamically significant stenosis (greater than 50%). Vertebral arteries: Right dominant system. No evidence of dissection, occlusion, or hemodynamically significant stenosis (greater than 50%). Skeleton: No acute osseous abnormality. Degenerative changes in the cervical spine. Other neck: Negative. Upper chest: No focal pulmonary opacity or pleural effusion. Review of the MIP images confirms the above findings CTA HEAD FINDINGS Anterior circulation: Both internal carotid arteries are  patent to the termini, without significant stenosis. A1 segments patent. Normal anterior communicating artery. Anterior cerebral arteries are patent to their distal aspects without significant stenosis. No M1 stenosis or occlusion. MCA branches perfused to their distal aspects without significant stenosis. Posterior circulation: Vertebral arteries patent to the vertebrobasilar junction without significant stenosis. Posterior inferior cerebellar arteries patent proximally. Basilar patent to its distal aspect without significant stenosis. Superior cerebellar arteries patent proximally. Patent right P1. Fetal origin of the left PCA from the left posterior communicating artery. PCAs perfused to their distal aspects without significant stenosis. The bilateral posterior communicating arteries are not visualized. Venous sinuses: As permitted by contrast timing, patent. Anatomic variants: Fetal origin of the left PCA. Review of the MIP images confirms the above findings IMPRESSION: 1. No intracranial large vessel occlusion or significant stenosis. 2. No hemodynamically significant stenosis in the neck. Imaging results were communicated on 08/21/2022 at 6:13 pm to provider Dr. Otelia Limes via secure text paging. Electronically Signed   By: Wiliam Ke M.D.   On: 08/21/2022 18:13   CT HEAD CODE STROKE WO CONTRAST  Result Date: 08/21/2022 CLINICAL DATA:  Code stroke.  Vision loss EXAM: CT HEAD WITHOUT CONTRAST TECHNIQUE: Contiguous axial images were obtained from the base of the skull through the vertex without intravenous contrast. RADIATION DOSE REDUCTION: This exam was performed according to  the departmental dose-optimization program which includes automated exposure control, adjustment of the mA and/or kV according to patient size and/or use of iterative reconstruction technique. COMPARISON:  12/09/2021 CTA head and neck FINDINGS: Brain: New hypodensity and loss of gray-white differentiation in the left occipital and  parietal lobe (series 3, images 20-23 and series 6, image 36-42). Redemonstrated hypodensity in the left anterior frontal lobe and right cerebellum. No evidence of acute hemorrhage, mass, mass effect, or midline shift. No hydrocephalus or extra-axial collection. Vascular: No hyperdense vessel. Skull: Negative for fracture or focal lesion. Sinuses/Orbits: No acute finding. Other: The mastoid air cells are well aerated. ASPECTS ([place]berta Stroke Program Early CT Score) - Ganglionic level infarction (caudate, lentiform nuclei, internal capsule, insula, M1-M3 cortex): 7 - Supraganglionic infarction (M4-M6 cortex): 3 Total score (0-10 with 10 being normal): 10 IMPRESSION: New hypodensity and loss of gray-white differentiation in the left occipital and parietal lobes, concerning for acute infarct. No acute intracranial hemorrhage. Imaging results were communicated on 08/21/2022 at 5:55 pm to provider Dr. Otelia Limes via secure text paging. Electronically Signed   By: Wiliam Ke M.D.   On: 08/21/2022 17:57     PHYSICAL EXAM  Temp:  [98.2 F (36.8 C)-98.3 F (36.8 C)] 98.3 F (36.8 C) (04/17 0914) Pulse Rate:  [59-97] 93 (04/17 1209) Resp:  [16-31] 20 (04/17 1209) BP: (112-152)/(63-106) 140/92 (04/17 1209) SpO2:  [95 %-100 %] 95 % (04/17 1209)  General - Well nourished, well developed, in no apparent distress.  Ophthalmologic - fundi not visualized due to noncooperation.  Cardiovascular - Regular rhythm and rate.  Neuro - awake, alert, eyes open, orientated to age, place, time and people. Slight word finding difficulty and expressive aphasia, following all simple commands, mild dysarthria. Able to name and repeat. No gaze palsy, tracking bilaterally, visual field exam showed right hemianopia. Slight chronic left facial droop. Tongue midline. LUE 5/5, RUE 5-/5, no drift, however, mild right hand dexterity difficulty. Bilaterally LEs 4+/5, no drift. Sensation symmetrical bilaterally, left FTN intact, right FTN  mild dysmetria chornic, gait not tested.    ASSESSMENT/PLAN [name] is a 47 y.o. female with history of recurrent cryptogenic strokes, DVT, obesity, hyperlipidemia, smoker admitted for headache and right hemianopia. No tPA given due to outside window and on Eliquis.    Stroke:  left PCA and MCA/PCA infarcts, embolic, cryptogenic etiology, likely due to nonspecific thrombophilia CT left occipital and parietal hypodensity CT head and neck unremarkable MRI left PCA and MCA/PCA infarcts 2D Echo pending Loop recorder interrogation pending LE venous doppler pending LDL 63 HgbA1c 6.3 UDS negative Autoimmune work up pending Eliquis for VTE prophylaxis aspirin 81 mg daily and Eliquis (apixaban) daily prior to admission, now on aspirin 81 mg daily and Eliquis (apixaban) daily.  Discussed with patient, she is in agreement to transition to Coumadin with INR goal 2.5-3.5.  Okay with aspirin 325 as a bridge. Patient counseled to be compliant with her antithrombotic medications Ongoing aggressive stroke risk factor management Therapy recommendations: Pending Disposition: Pending  History of strokes Remote bilateral frontal and left cerebellar infarct 01/2020 admitted for right-sided weakness, aphasia and right facial/arm numbness.  CT showed subacute left frontal infarct.  CTA head and neck and CT perfusion negative.  MRI showed left frontal cortical and subcortical infarcts.  MRA head and neck negative.  EEG negative.  EF 55 to 60%.  TCD insignificant HITS.  LE venous Doppler no DVT.  TEE showed very small PFO.  CSF analysis negative.  LDL 125,  A1c 5.8.  Hypercoag workup only showed antiphospholipid IgM 14, slightly elevated.  Discussed with cardiology, no indication for PFO closure.  Patient discharged on DAPT and Lipitor 40. 09/2020 admitted in [place] for aphasia and right-sided weakness.  Status post tPA.  CTA head and neck left M1 occlusion.  Status post IR with TICI2b  reperfusion.  2D echo and TEE unremarkable.  However, LE venous Doppler showed positive right external iliac vein thrombosis.  Put on Eliquis.  Patient had residual right-sided weakness and speech difficulty. 08/2021 patient was off Eliquis for 1 months and then developed left-sided numbness and slurred speech.  MRI showed large right cerebellar infarct.  CTA neck showed right SCA occlusion.  TTE unremarkable.  UDS positive for cocaine.  Loop recorder placed. 11/14/2021 follow-up [facility] with Ihor Austin, repeat LE vein Doppler negative for DVT.  Patient was off Eliquis for the last 2 weeks. 12/04/2021 follow-up with Dr. Rubie Maid cardiology, loop recorder no A-fib and reviewed TEE in the past showed no PFO. 12/2021 admitted for dysarthria.  MRI showed small acute cortical infarcts scattered in the right MCA distribution.  CT head and neck unremarkable.  EF 60 to 65%.  Loop recorder no A-fib.  LDL 61, A1c 6.2.  Repeat hypercoag workup again unremarkable.  Patient prefer Eliquis as she had frequent travel.  Discharged on aspirin 81 and Eliquis 5 mg twice daily as well as Lipitor and Zetia.  Recommend smoking cessation. Follow-up in [facility] in 01/2022, doing better.  Patient stated that she is compliant with Eliquis.  Pre diabetes HgbA1c 6.3 goal < 7.0 CBG monitoring while inpatient [facility]SI DM education and close PCP follow up  Hypertension Stable Avoid low BP Long term BP goal normotensive  Hyperlipidemia Home meds: Lipitor 80 and Zetia LDL 63, goal < 70 Now on Lipitor 80 and Zetia Continue statin at discharge  Tobacco abuse Current intermittent smoker Smoking cessation counseling provided Pt is willing to quit completely   Other Stroke Risk Factors Obesity, Body mass index is 34.94 kg/m., BMI >/= 30 associated with increased stroke risk, recommend weight loss, diet and exercise as appropriate  Prior Hx cocaine use  OSA   Other Active Problems Hypothyroidism, on supplement  Hospital day #  1   Marvel Plan, MD PhD Stroke Neurology 08/22/2022 1:01 PM    To contact Stroke Continuity provider, please refer to [email or web address]. After hours, contact General Neurology

## 2022-08-22 NOTE — ED Notes (Signed)
ED TO INPATIENT HANDOFF REPORT  ED Nurse Name and Phone #: Delice Bison, RN  S Name/Age/Gender Maria Porter 47 y.o. female Room/Bed: 037C/037C  Code Status   Code Status: DNR  Home/SNF/Other Home Patient oriented to: self, place, time, and situation Is this baseline? Yes   Triage Complete: Triage complete  Chief Complaint Acute CVA (cerebrovascular accident) [I63.9]  Triage Note Pt reports loss of peripheral vision in R eye at 1445 today, followed by headache that feels abnormal compared to her normal headache. Hx stroke with R sided weakness.    Allergies No Known Allergies  Level of Care/Admitting Diagnosis ED Disposition     ED Disposition  Admit   Condition  --   Comment  Hospital Area: [facility] [[id number]]  Level of Care: Telemetry Medical [104]  May admit patient to [facility] or [facility] if equivalent level of care is available:: No  Covid Evaluation: Asymptomatic - no recent exposure (last 10 days) testing not required  Diagnosis: Acute CVA (cerebrovascular accident) [[id number]]  Admitting Physician: Ginnie Smart [[id number]]  Attending Physician: Ninetta Lights, JEFFREY C [[id number]]  Certification:: I certify this patient will need inpatient services for at least 2 midnights  Estimated Length of Stay: 2          B Medical/Surgery History Past Medical History:  Diagnosis Date   Acute deep vein thrombosis of right iliac vein 09/14/2020   Stroke    Past Surgical History:  Procedure Laterality Date   BUBBLE STUDY  01/29/2020   Procedure: BUBBLE STUDY;  Surgeon: Little Ishikawa, MD;  Location: [facility] ENDOSCOPY;  Service: Cardiovascular;;   TEE WITHOUT CARDIOVERSION N/A 01/29/2020   Procedure: TRANSESOPHAGEAL ECHOCARDIOGRAM (TEE);  Surgeon: Little Ishikawa, MD;  Location: [facility] ENDOSCOPY;  Service: Cardiovascular;  Laterality: N/A;     A IV Location/Drains/Wounds Patient Lines/Drains/Airways Status     Active Line/Drains/Airways      Name Placement date Placement time Site Days   Peripheral IV 08/21/22 20 G Anterior;Right Forearm 08/21/22  1755  Forearm  1   Peripheral IV 08/22/22 20 G 1" Distal;Posterior;Right Forearm 08/22/22  0606  Forearm  less than 1   Incision (Closed) 01/29/20 Back Lower 01/29/20  1130  -- 936            Intake/Output Last 24 hours No intake or output data in the 24 hours ending 08/22/22 1215  Labs/Imaging Results for orders placed or performed during the hospital encounter of 08/21/22 (from the past 48 hour(s))  CBG monitoring, ED     Status: Abnormal   Collection Time: 08/21/22  5:40 PM  Result Value Ref Range   Glucose-Capillary 107 (H) 70 - 99 mg/dL    Comment: Glucose reference range applies only to samples taken after fasting for at least 8 hours.  Ethanol     Status: None   Collection Time: 08/21/22  5:51 PM  Result Value Ref Range   Alcohol, Ethyl (B) <10 <10 mg/dL    Comment: (NOTE) Lowest detectable limit for serum alcohol is 10 mg/dL.  For medical purposes only. Performed at [facility], [address], [place], [place] [address]   Protime-INR     Status: None   Collection Time: 08/21/22  5:51 PM  Result Value Ref Range   Prothrombin Time 14.0 11.4 - 15.2 seconds   INR 1.1 0.8 - 1.2    Comment: (NOTE) INR goal varies based on device and disease states. Performed at [facility], [address], [place], [place] [address]   APTT     Status: None   Collection Time: 08/21/22  5:51 PM  Result Value Ref Range   aPTT 29 24 - 36 seconds    Comment: Performed at [facility], [address] [place], [place] [address]  CBC     Status: None   Collection Time: 08/21/22  5:51 PM  Result Value Ref Range   WBC 7.7 4.0 - 10.5 K/uL   RBC 4.98 3.87 - 5.11 MIL/uL   Hemoglobin 14.1 12.0 - 15.0 g/dL   HCT 21.3 08.6 - 57.8 %   MCV 86.5 80.0 - 100.0 fL   MCH 28.3 26.0 - 34.0 pg   MCHC 32.7 30.0 - 36.0 g/dL   RDW 46.9 62.9 - 52.8 %   Platelets 399 150 - 400  K/uL   nRBC 0.0 0.0 - 0.2 %    Comment: Performed at [facility], [address], [place], [place] [address]  Differential     Status: None   Collection Time: 08/21/22  5:51 PM  Result Value Ref Range   Neutrophils Relative % 51 %   Neutro Abs 4.0 1.7 - 7.7 K/uL   Lymphocytes Relative 40 %   Lymphs Abs 3.0 0.7 - 4.0 K/uL   Monocytes Relative 5 %   Monocytes Absolute 0.4 0.1 - 1.0 K/uL   Eosinophils Relative 3 %   Eosinophils Absolute 0.2 0.0 - 0.5 K/uL   Basophils Relative 1 %   Basophils Absolute 0.0 0.0 - 0.1 K/uL   Immature Granulocytes 0 %   Abs Immature Granulocytes 0.03 0.00 - 0.07 K/uL    Comment: Performed at [facility], [address], [place], [place] [address]  Comprehensive metabolic panel     Status: Abnormal   Collection Time: 08/21/22  5:51 PM  Result Value Ref Range   Sodium 138 135 - 145 mmol/L   Potassium 3.8 3.5 - 5.1 mmol/L   Chloride 102 98 - 111 mmol/L   CO2 24 22 - 32 mmol/L   Glucose, Bld 109 (H) 70 - 99 mg/dL    Comment: Glucose reference range applies only to samples taken after fasting for at least 8 hours.   BUN 8 6 - 20 mg/dL   Creatinine, Ser 7.25 0.44 - 1.00 mg/dL   Calcium 9.6 8.9 - 36.6 mg/dL   Total Protein 8.4 (H) 6.5 - 8.1 g/dL   Albumin 3.9 3.5 - 5.0 g/dL   AST 27 15 - 41 U/L   ALT 35 0 - 44 U/L   Alkaline Phosphatase 140 (H) 38 - 126 U/L   Total Bilirubin 0.4 0.3 - 1.2 mg/dL   GFR, Estimated >44 >03 mL/min    Comment: (NOTE) Calculated using the CKD-EPI Creatinine Equation (2021)    Anion gap 12 5 - 15    Comment: Performed at [facility], [address], [place], [place] [address]  I-stat chem 8, ED     Status: Abnormal   Collection Time: 08/21/22  5:55 PM  Result Value Ref Range   Sodium 139 135 - 145 mmol/L   Potassium 3.9 3.5 - 5.1 mmol/L   Chloride 104 98 - 111 mmol/L   BUN 8 6 - 20 mg/dL   Creatinine, Ser 9.56 0.44 - 1.00 mg/dL   Glucose, Bld 387 (H) 70 - 99 mg/dL    Comment: Glucose reference range  applies only to samples taken after fasting for  at least 8 hours.   Calcium, Ion 1.10 (L) 1.15 - 1.40 mmol/L   TCO2 25 22 - 32 mmol/L   Hemoglobin 15.3 (H) 12.0 - 15.0 g/dL   HCT 40.9 81.1 - 91.4 %  Urine rapid drug screen (hosp performed)     Status: None   Collection Time: 08/21/22  8:57 PM  Result Value Ref Range   Opiates NONE DETECTED NONE DETECTED   Cocaine NONE DETECTED NONE DETECTED   Benzodiazepines NONE DETECTED NONE DETECTED   Amphetamines NONE DETECTED NONE DETECTED   Tetrahydrocannabinol NONE DETECTED NONE DETECTED   Barbiturates NONE DETECTED NONE DETECTED    Comment: (NOTE) DRUG SCREEN FOR MEDICAL PURPOSES ONLY.  IF CONFIRMATION IS NEEDED FOR ANY PURPOSE, NOTIFY LAB WITHIN 5 DAYS.  LOWEST DETECTABLE LIMITS FOR URINE DRUG SCREEN Drug Class                     Cutoff (ng/mL) Amphetamine and metabolites    1000 Barbiturate and metabolites    200 Benzodiazepine                 200 Opiates and metabolites        300 Cocaine and metabolites        300 THC                            50 Performed at [facility], [address] [place], [place] [address]   Urinalysis, Routine w reflex microscopic -Urine, Clean Catch     Status: Abnormal   Collection Time: 08/21/22  8:57 PM  Result Value Ref Range   Color, Urine YELLOW YELLOW   APPearance HAZY (A) CLEAR   Specific Gravity, Urine >1.046 (H) 1.005 - 1.030   pH 5.0 5.0 - 8.0   Glucose, UA NEGATIVE NEGATIVE mg/dL   Hgb urine dipstick NEGATIVE NEGATIVE   Bilirubin Urine NEGATIVE NEGATIVE   Ketones, ur NEGATIVE NEGATIVE mg/dL   Protein, ur NEGATIVE NEGATIVE mg/dL   Nitrite NEGATIVE NEGATIVE   Leukocytes,Ua NEGATIVE NEGATIVE    Comment: Performed at [facility], [address] [place], [place] [address]  Basic metabolic panel     Status: Abnormal   Collection Time: 08/22/22  5:20 AM  Result Value Ref Range   Sodium 136 135 - 145 mmol/L   Potassium 4.0 3.5 - 5.1 mmol/L   Chloride 103 98 - 111 mmol/L    CO2 24 22 - 32 mmol/L   Glucose, Bld 127 (H) 70 - 99 mg/dL    Comment: Glucose reference range applies only to samples taken after fasting for at least 8 hours.   BUN 5 (L) 6 - 20 mg/dL   Creatinine, Ser 8.65 0.44 - 1.00 mg/dL   Calcium 9.3 8.9 - 78.4 mg/dL   GFR, Estimated >69 >62 mL/min    Comment: (NOTE) Calculated using the CKD-EPI Creatinine Equation (2021)    Anion gap 9 5 - 15    Comment: Performed at [facility], [address], [place], [place] [address]  CBC     Status: Abnormal   Collection Time: 08/22/22  5:20 AM  Result Value Ref Range   WBC 8.6 4.0 - 10.5 K/uL   RBC 4.65 3.87 - 5.11 MIL/uL   Hemoglobin 13.3 12.0 - 15.0 g/dL   HCT 13.2 44.0 - 10.2 %   MCV 84.7 80.0 - 100.0 fL   MCH 28.6 26.0 -  34.0 pg   MCHC 33.8 30.0 - 36.0 g/dL   RDW 86.5 78.4 - 69.6 %   Platelets 410 (H) 150 - 400 K/uL   nRBC 0.0 0.0 - 0.2 %    Comment: Performed at [facility], [address], [place], [place] [address]  Lipid panel     Status: None   Collection Time: 08/22/22  5:20 AM  Result Value Ref Range   Cholesterol 114 0 - 200 mg/dL   Triglycerides 47 <413 mg/dL   HDL 42 >24 mg/dL   Total CHOL/HDL Ratio 2.7 RATIO   VLDL 9 0 - 40 mg/dL   LDL Cholesterol 63 0 - 99 mg/dL    Comment:        Total Cholesterol/HDL:CHD Risk Coronary Heart Disease Risk Table                     Men   Women  1/2 Average Risk   3.4   3.3  Average Risk       5.0   4.4  2 X Average Risk   9.6   7.1  3 X Average Risk  23.4   11.0        Use the calculated Patient Ratio above and the CHD Risk Table to determine the patient's CHD Risk.        ATP III CLASSIFICATION (LDL):  <100     mg/dL   Optimal  401-027  mg/dL   Near or Above                    Optimal  130-159  mg/dL   Borderline  253-664  mg/dL   High  >403     mg/dL   Very High Performed at [facility], [address] [place], [place] [address]   Hemoglobin A1c     Status: Abnormal   Collection Time: 08/22/22  5:20 AM   Result Value Ref Range   Hgb A1c MFr Bld 6.3 (H) 4.8 - 5.6 %    Comment: (NOTE) Pre diabetes:          5.7%-6.4%  Diabetes:              >6.4%  Glycemic control for   <7.0% adults with diabetes    Mean Plasma Glucose 134.11 mg/dL    Comment: Performed at [facility], [address], [place], [place] [address]  TSH     Status: Abnormal   Collection Time: 08/22/22  5:20 AM  Result Value Ref Range   TSH 20.934 (H) 0.350 - 4.500 uIU/mL    Comment: Performed by a 3rd Generation assay with a functional sensitivity of <=0.01 uIU/mL. Performed at [facility], [address], [place], [place] [address]   Sedimentation rate     Status: Abnormal   Collection Time: 08/22/22  5:20 AM  Result Value Ref Range   Sed Rate 36 (H) 0 - 22 mm/hr    Comment: Performed at [facility], [address] [place], [place] [address]  C-reactive protein     Status: Abnormal   Collection Time: 08/22/22  5:20 AM  Result Value Ref Range   CRP 1.6 (H) <1.0 mg/dL    Comment: Performed at [facility], [address], [place], [place] [address]   MR BRAIN WO CONTRAST  Result Date: 08/21/2022 CLINICAL DATA:  Stroke follow-up EXAM: MRI HEAD WITHOUT CONTRAST TECHNIQUE: Multiplanar, multiecho  pulse sequences of the brain and surrounding structures were obtained without intravenous contrast. COMPARISON:  None Available. FINDINGS: Brain: Cortical ischemia throughout the left occipital lobe. Multifocal chronic microhemorrhage in subarachnoid siderosis. No acute hemorrhage. Old infarcts of the right cerebellum and left frontal lobe. There is multifocal hyperintense T2-weighted signal within the white matter. Generalized volume loss. The midline structures are normal. Vascular: Major flow voids are preserved. Skull and upper cervical spine: Normal calvarium and skull base. Visualized upper cervical spine and soft tissues are normal. Sinuses/Orbits:No paranasal sinus fluid levels or advanced mucosal  thickening. No mastoid or middle ear effusion. Normal orbits. IMPRESSION: 1. Acute/early subacute cortical ischemia throughout the left occipital lobe. No acute hemorrhage or mass effect. 2. Multifocal chronic microhemorrhage and subarachnoid siderosis. 3. Old infarcts of the right cerebellum and left frontal lobe. Electronically Signed   By: Deatra Robinson M.D.   On: 08/21/2022 22:34   DG CHEST PORT 1 VIEW  Result Date: 08/21/2022 CLINICAL DATA:  Loss of vision, acute CVA EXAM: PORTABLE CHEST 1 VIEW COMPARISON:  None Available. FINDINGS: Hazy opacity over the left lower lung is favored projectional due to soft tissue overlap. No focal consolidation, pleural effusion, or pneumothorax. Normal cardiomediastinal silhouette. IMPRESSION: No active disease. Electronically Signed   By: Minerva Fester M.D.   On: 08/21/2022 21:04   CT ANGIO HEAD NECK W WO CM  Result Date: 08/21/2022 CLINICAL DATA:  Loss of vision EXAM: CT ANGIOGRAPHY HEAD AND NECK WITH AND WITHOUT CONTRAST TECHNIQUE: Multidetector CT imaging of the head and neck was performed using the standard protocol during bolus administration of intravenous contrast. Multiplanar CT image reconstructions and MIPs were obtained to evaluate the vascular anatomy. Carotid stenosis measurements (when applicable) are obtained utilizing NASCET criteria, using the distal internal carotid diameter as the denominator. RADIATION DOSE REDUCTION: This exam was performed according to the departmental dose-optimization program which includes automated exposure control, adjustment of the mA and/or kV according to patient size and/or use of iterative reconstruction technique. CONTRAST:  75 mL Omnipaque 350 COMPARISON:  12/10/2021 CTA head neck FINDINGS: CT HEAD FINDINGS For noncontrast findings, please see same day CT head. CTA NECK FINDINGS Aortic arch: Two-vessel arch with a common origin of the brachiocephalic and left common carotid arteries. Imaged portion shows no evidence  of aneurysm or dissection. No significant stenosis of the major arch vessel origins. Right carotid system: No evidence of dissection, occlusion, or hemodynamically significant stenosis (greater than 50%). Left carotid system: No evidence of dissection, occlusion, or hemodynamically significant stenosis (greater than 50%). Vertebral arteries: Right dominant system. No evidence of dissection, occlusion, or hemodynamically significant stenosis (greater than 50%). Skeleton: No acute osseous abnormality. Degenerative changes in the cervical spine. Other neck: Negative. Upper chest: No focal pulmonary opacity or pleural effusion. Review of the MIP images confirms the above findings CTA HEAD FINDINGS Anterior circulation: Both internal carotid arteries are patent to the termini, without significant stenosis. A1 segments patent. Normal anterior communicating artery. Anterior cerebral arteries are patent to their distal aspects without significant stenosis. No M1 stenosis or occlusion. MCA branches perfused to their distal aspects without significant stenosis. Posterior circulation: Vertebral arteries patent to the vertebrobasilar junction without significant stenosis. Posterior inferior cerebellar arteries patent proximally. Basilar patent to its distal aspect without significant stenosis. Superior cerebellar arteries patent proximally. Patent right P1. Fetal origin of the left PCA from the left posterior communicating artery. PCAs perfused to their distal aspects without significant stenosis. The bilateral posterior communicating arteries are not visualized.  Venous sinuses: As permitted by contrast timing, patent. Anatomic variants: Fetal origin of the left PCA. Review of the MIP images confirms the above findings IMPRESSION: 1. No intracranial large vessel occlusion or significant stenosis. 2. No hemodynamically significant stenosis in the neck. Imaging results were communicated on 08/21/2022 at 6:13 pm to provider Dr.  Otelia Limes via secure text paging. Electronically Signed   By: Wiliam Ke M.D.   On: 08/21/2022 18:13   CT HEAD CODE STROKE WO CONTRAST  Result Date: 08/21/2022 CLINICAL DATA:  Code stroke.  Vision loss EXAM: CT HEAD WITHOUT CONTRAST TECHNIQUE: Contiguous axial images were obtained from the base of the skull through the vertex without intravenous contrast. RADIATION DOSE REDUCTION: This exam was performed according to the departmental dose-optimization program which includes automated exposure control, adjustment of the mA and/or kV according to patient size and/or use of iterative reconstruction technique. COMPARISON:  12/09/2021 CTA head and neck FINDINGS: Brain: New hypodensity and loss of gray-white differentiation in the left occipital and parietal lobe (series 3, images 20-23 and series 6, image 36-42). Redemonstrated hypodensity in the left anterior frontal lobe and right cerebellum. No evidence of acute hemorrhage, mass, mass effect, or midline shift. No hydrocephalus or extra-axial collection. Vascular: No hyperdense vessel. Skull: Negative for fracture or focal lesion. Sinuses/Orbits: No acute finding. Other: The mastoid air cells are well aerated. ASPECTS (Alberta Stroke Program Early CT Score) - Ganglionic level infarction (caudate, lentiform nuclei, internal capsule, insula, M1-M3 cortex): 7 - Supraganglionic infarction (M4-M6 cortex): 3 Total score (0-10 with 10 being normal): 10 IMPRESSION: New hypodensity and loss of gray-white differentiation in the left occipital and parietal lobes, concerning for acute infarct. No acute intracranial hemorrhage. Imaging results were communicated on 08/21/2022 at 5:55 pm to provider Dr. Otelia Limes via secure text paging. Electronically Signed   By: Wiliam Ke M.D.   On: 08/21/2022 17:57    Pending Labs Unresulted Labs (From admission, onward)     Start     Ordered   08/22/22 0719  T4, free  Add-on,   AD        08/22/22 0719   08/22/22 0500  ANA w/Reflex  if Positive  Tomorrow morning,   R        08/21/22 2036   08/22/22 0500  C3 complement  Tomorrow morning,   R        08/21/22 2042   08/22/22 0500  C4 complement  Tomorrow morning,   R        08/21/22 2042   08/22/22 0500  ANCA Titers  (Anti-Neutrophilic Cystoplasmic Antibody Panel (PNL))  Tomorrow morning,   R        08/21/22 2045            Vitals/Pain Today's Vitals   08/22/22 0715 08/22/22 0914 08/22/22 0930 08/22/22 1209  BP: 127/78   (!) 140/92  Pulse: 86  97 93  Resp: (!) 23  (!) 21 20  Temp:  98.3 F (36.8 C)    TempSrc:  Oral    SpO2: 97%  100% 95%  PainSc:        Isolation Precautions No active isolations  Medications Medications  aspirin EC tablet 81 mg (81 mg Oral Given 08/22/22 0912)  atorvastatin (LIPITOR) tablet 80 mg (80 mg Oral Given 08/22/22 0912)  ezetimibe (ZETIA) tablet 10 mg (10 mg Oral Given 08/22/22 0912)  levothyroxine (SYNTHROID) tablet 175 mcg (175 mcg Oral Given 08/22/22 0544)  pantoprazole (PROTONIX) EC tablet 40 mg (40 mg  Oral Given 08/22/22 0912)  polyethylene glycol (MIRALAX / GLYCOLAX) packet 17 g (has no administration in time range)  apixaban (ELIQUIS) tablet 5 mg (5 mg Oral Given 08/22/22 0912)  albuterol (PROVENTIL) (2.5 MG/3ML) 0.083% nebulizer solution 2.5 mg (has no administration in time range)  iohexol (OMNIPAQUE) 350 MG/ML injection 75 mL (75 mLs Intravenous Contrast Given 08/21/22 1804)  prochlorperazine (COMPAZINE) injection 10 mg (10 mg Intravenous Given 08/21/22 1946)  diphenhydrAMINE (BENADRYL) injection 25 mg (25 mg Intravenous Given 08/21/22 1946)  LORazepam (ATIVAN) tablet 0.5 mg (0.5 mg Oral Given 08/21/22 2045)    Mobility walks with device     Focused Assessments Cardiac Assessment Handoff:  Cardiac Rhythm: Normal sinus rhythm No results found for: "CKTOTAL", "CKMB", "CKMBINDEX", "TROPONINI" No results found for: "DDIMER" Does the Patient currently have chest pain? No    R Recommendations: See Admitting Provider  Note  Report given to:   Additional Notes:

## 2022-08-22 NOTE — Progress Notes (Signed)
Subjective:  Ms Askren xxx X was feeling good this morning, states that her headache from last night has completely resolved. Expressed thorough understanding of medical history and reason for current hospitalization. Reports taking apixaban and other medications regularly including throughout last few weeks. Discussed plan to continue current medications and proceed with hypercoagulability workup including hematology consult. All questions were answered.     Objective: Vitals over previous 24hr: Vitals:   08/22/22 0715 08/22/22 0914 08/22/22 0930 08/22/22 1209  BP: 127/78   (!) 140/92  Pulse: 86  97 93  Resp: (!) 23  (!) 21 20  Temp:  98.3 F (36.8 C)    TempSrc:  Oral    SpO2: 97%  100% 95%    General:      awake and alert, lying comfortably in bed, cooperative, not in acute distress Skin:       warm and dry, intact without any obvious lesions or scars, no rashes Eyes:      extraocular movements intact, pupils round and reactive to light Lungs:      normal respiratory effort, breathing unlabored, symmetrical chest rise Cardiac:      regular rate and rhythm, normal S1 and S2, capillary no pitting edema Abdomen:      soft and non-distended, normoactive bowel sounds, no tenderness to palpation or guarding Musculoskeletal:  motor strength 5/5 in LUE-LLE and 4/5 RUE-RLE Neurologic:      oriented to person-place-time, right homonomous hemianopsia, CN II-XI intact with diminished sensation across CN V2 distribution on L, sensation to light touch intact and symmetric across all four extremities Psychiatric:      euthymic mood with congruent affect, intelligible speech with slight [place] accent    Assessment/Plan: Ms Kiser is a 47 year old female with a past medical history of obesity, hyperlipidemia, DVT, and multiple CVAs who presented with acute bilateral vision loss in peripheral right fields and an associated headache, now admitted for management of  stroke.    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 Extensive hypercoagulability workup in the past largely unremarkable, notable only for slightly elevated antiphospholipid IgM level and possible PFO. Only significant risk factor is smoking. Current medications include aspirin, apixaban, atorvastatin, and ezetimibe. Etiology remains unclear, possibly embolic strokes of undetermined source. Presented on 4-16 with right-sided vision loss and headache. Upon arrival, brain MRI revealed subacute cortical ischemia involving occipital lobe. Exam notable for right homonymous hemianopsia. Home medication regimen of aspirin, apixaban, and atorvastatin continued at admission. Given recent stroke despite anticoagulation, escalation to warfarin is warranted.  > Neurology consult, appreciate recommendations  > Warfarin per pharmacy  > Atorvastatin  q24  > Ezetimibe  q24  > Occupational and speech evaluation  > Echocardiogram with bubble study   ---Hypercoagulable state  ---History of right iliac veinous thrombus 2022 Patient has history of multiple strokes since 2021 and deep vein thrombosis in 2022. Prior hypercoagulability workup has been largely unremarkable, notable only for a slightly elevated antiphospholipid IgM level. Adherence to aspirin, apixaban, and atorvastatin has been good per patient. Repeat laboratory testing during current hospitalization is warranted to identify underlying cause, which remains unclear possibly antiphospholipid syndrome. Notable findings include elevated ESR and CRP. Given recent stroke despite anticoagulation therapy, hematology team has been consulted.  > Hematology consult, appreciate recommendations  > Warfarin per pharmacy  > Check ANA and ANCA  antibodies  > Check C3 and C4   ---Hyperlipidemia  ---Obesity Patient has history of obesity and hyperlipidemia. Home medications include atorvastatin and ezetimibe, which she reports taking daily. Upon arrival, lipid panel notable for TChol 114 and LDL 63. Atorvastatin continued upon admission.  > Atorvastatin  q24  > Ezetimibe  q24   ---Hypothyroidism Patient has history of hypothyroidism managed with levothyroxine, which she takes daily. Previous TSH collected 12-2021 was 17. Upon arrival, TSH was 21. Given acute illness, current levothyroxine dose was continued on admission. Subsequent free T4 level is currently pending. Recommend further monitoring and titration in the outpatient setting.  > Levothyroxine 175ug q24  > Check fT4   ---Tobacco use Patient has been smoking cigarettes for over thirty years, recently abstained for about 654mo but then resumed 654mo ago. Previously used 1ppd and currently smokes about five cigarettes per day. Nicotine patches have been somewhat helpful in the past. Understands that tobacco use places her at higher risk for strokes, she has expressed the desire to quit entirely. Upon arrival, chest radiograph was unremarkable.  > Encourage smoking cessation  > Consider nicotine patches as outpatient   ---Prediabetes Patient has history of prediabetes dating back to at least 2021 per electronic medical record. Her A1c has been gradually increasing since then and is currently 6.3.   > Encourage diet and lifestyle modifications    Principal Problem:   Acute CVA (cerebrovascular accident) Active Problems:   Tobacco use disorder   Hyperlipemia   Subclinical hypothyroidism   History of DVT (deep vein thrombosis)   Visual field defect   Primary hypercoagulable state    Prior to Admission Living Arrangement: home alone  Anticipated Discharge Location: home Barriers to Discharge: etiologic workup and functional assessment Dispo: Anticipated  discharge in approximately 2-3 day(s).    Lajuana Ripple, MD Internal Medicine PGY-1 Pager [phone]  After 5pm on weekdays and 1pm on weekends: On Call pager [phone]

## 2022-08-22 NOTE — Progress Notes (Signed)
ANTICOAGULATION CONSULT NOTE - Initial Consult  Pharmacy Consult for Warfarin Indication:  multiple CVAs , h/o DVT  No Known Allergies  Patient Measurements:    Vital Signs: Temp: 98.3 F (36.8 C) (04/17 0914) Temp Source: Oral (04/17 0914) BP: 140/92 (04/17 1209) Pulse Rate: 93 (04/17 1209)  Labs: Recent Labs    08/21/22 1751 08/21/22 1755 08/22/22 0520  HGB 14.1 15.3* 13.3  HCT 43.1 45.0 39.4  PLT 399  --  410*  APTT 29  --   --   LABPROT 14.0  --   --   INR 1.1  --   --   CREATININE 0.93 0.70 0.81    CrCl cannot be calculated (Unknown ideal weight.).   Medical History: Past Medical History:  Diagnosis Date   Acute deep vein thrombosis of right iliac vein 09/14/2020   Stroke     Medications:  (Not in a hospital admission)   Assessment: 47 yo F with history of DVT, multiple CVAs, HLD, and obesity presented with acute bilateral vision loss and headache. Pt found to have subacute cortical schemia involving the occipital lobe. Pt was on apixaban  BID prior to admission. Last dose of apixaban was on 4/16 prior to admission, and 4/17 at 09:21 inpatient.  Pharmacy consulted to transition patient from apixaban to warfarin without bridging.   INR 1.1  Hgb 13.3, Plt 410- stable No s/sx of bleeding  Goal of Therapy:  INR 2.5-3.5 Monitor platelets by anticoagulation protocol: Yes   Plan:  STOP apixaban  BID Change aspirin dose from  daily to  po daily as per neurology recommendations.  Start warfarin  PO x1 tonight at 22:00 (when next dose of apixaban would have been due to minimize bleeding risk) Monitor daily CBC, INR, and monitor for s/sx of bleeding   Wilburn Cornelia, PharmD, BCPS Clinical Pharmacist 08/22/2022 1:35 PM   Please refer to [facility] for pharmacy phone number

## 2022-08-22 NOTE — Evaluation (Signed)
Speech Language Pathology Evaluation Patient Details Name: Maria Porter MRN: [id number] DOB: 04-Nov-1975 Today's Date: 08/22/2022 Time: 4098-1191 SLP Time Calculation (min) (ACUTE ONLY): 21 min  Problem List:  Patient Active Problem List   Diagnosis Date Noted   Visual field defect 08/22/2022   Primary hypercoagulable state 08/22/2022   Acute CVA (cerebrovascular accident) 08/21/2022   Blurry vision, bilateral 08/09/2022   Urinary incontinence 02/22/2022   Preoperative clearance 02/21/2022   GERD (gastroesophageal reflux disease) 07/20/2021   Healthcare maintenance 03/07/2021   History of DVT (deep vein thrombosis) 10/06/2020   Family history of premature coronary heart disease 09/14/2020   OSA (obstructive sleep apnea) 09/14/2020   Pre-diabetes 02/03/2020   Subclinical hypothyroidism 02/03/2020   Tobacco use disorder 01/27/2020   Hyperlipemia 01/27/2020   Obesity 01/27/2020   History of CVA with residual deficit 01/26/2020   Extrinsic asthma 10/09/2004   Past Medical History:  Past Medical History:  Diagnosis Date   Acute deep vein thrombosis of right iliac vein 09/14/2020   Stroke    Past Surgical History:  Past Surgical History:  Procedure Laterality Date   BUBBLE STUDY  01/29/2020   Procedure: BUBBLE STUDY;  Surgeon: Little Ishikawa, MD;  Location: [facility] ENDOSCOPY;  Service: Cardiovascular;;   TEE WITHOUT CARDIOVERSION N/A 01/29/2020   Procedure: TRANSESOPHAGEAL ECHOCARDIOGRAM (TEE);  Surgeon: Little Ishikawa, MD;  Location: [facility] ENDOSCOPY;  Service: Cardiovascular;  Laterality: N/A;   HPI:  47 year old female comes in with chief complaint of sudden onset vision loss. Pt with recurrent cryptogenic CVAs, history of DVTs, hypothyroidism, hyperlipidemia on Eliquis. Pt has residual R weakness from prior strokes, mild weakness, and dyarthria?foreign accent syndrome   Assessment / Plan / Recommendation Clinical Impression  Pt presents with functional cognitive  linguistic ability.  She was assessed using the COGNISTAT (see below for additional information), and scored within the average range on all subtests administered.  She states she has had no changes to speech, language, or cognition.  She has a residual mild aphasia from previous stroke but is able to communicate independently.  She used circumlocution and indicated she needed additional time independently.  She aquired foreign accent syndrome following an earlier stroke and speaks with a [place] accent at baseline.  Pt has no further ST needs as she is at her cognitive-lingusitic and motor speech baseline.  SLP will sign off at this time.  Please re-consult if there is a change in functional status.  COGNISTAT: All subtests are within the average range, except where otherwise specified.  Orientation:  11/12 Attention: 8/8 Comprehension: 5/6 Repetition: 12/12 Naming: 8/8 Construction: not assessed Memory: 9/12, borderline, does not reach the level of mild impairment Calculations: 4/4 Similarities: 7/8 Judgment: 6/6      SLP Assessment  SLP Recommendation/Assessment: Patient needs continued Speech Lanaguage Pathology Services SLP Visit Diagnosis: Cognitive communication deficit (R41.841)    Recommendations for follow up therapy are one component of a multi-disciplinary discharge planning process, led by the attending physician.  Recommendations may be updated based on patient status, additional functional criteria and insurance authorization.    Follow Up Recommendations  No SLP follow up    Assistance Recommended at Discharge  None  Functional Status Assessment Patient has not had a recent decline in their functional status  Frequency and Duration  (N/A)         SLP Evaluation Cognition  Overall Cognitive Status: Within Functional Limits for tasks assessed Orientation Level: Oriented X4 Year: 2024 Month: April Day of Week: Correct Attention: Focused;Sustained  Focused  Attention: Appears intact Sustained Attention: Appears intact Memory: Appears intact Awareness: Appears intact Problem Solving: Appears intact Executive Function: Reasoning Reasoning: Appears intact       Comprehension  Auditory Comprehension Overall Auditory Comprehension: Appears within functional limits for tasks assessed Commands: Within Functional Limits Conversation: Complex Visual Recognition/Discrimination Discrimination: Not tested Reading Comprehension Reading Status: Not tested    Expression Expression Primary Mode of Expression: Verbal Verbal Expression Overall Verbal Expression: Appears within functional limits for tasks assessed Repetition: No impairment Naming: No impairment Pragmatics: No impairment Effective Techniques:  (Circumlocution)   Oral / Motor  Motor Speech Overall Motor Speech: Impaired at baseline Respiration: Within functional limits Phonation: Normal Resonance: Within functional limits Articulation: Impaired (Foreign accent syndrome) Intelligibility: Intelligible Motor Speech Errors: Aware            Kerrie Pleasure, MA, CCC-SLP Acute Rehabilitation Services Office: [phone] 08/22/2022, 11:21 AM

## 2022-08-22 NOTE — Consult Note (Signed)
HEMATOLOGY/ONCOLOGY INPATIENT CONSULT NOTE  Date of Service: 08/22/2022  Inpatient Attending: .Ginnie Smart, MD  CC : Evaluation for hypercoagulable state  HPI  Hematology oncology was consulted for by Dr. Rogelia Mire MD for evaluation of possible hypercoagulable state in the context of recurrent DVTs and CVA's  Maria Porter is a 47 y.o. female with a history of hypertension, dyslipidemia, prediabetes, hypothyroidism, tobacco abuse, previous history of cocaine abuse and previous history of DVTs and CVA with previous concerns for hypercoagulability. Patient has a history of remote bifrontal and left cerebellar infarct and was admitted in September 2021 with right-sided weakness aphasia and right facial and arm numbness.  MRI showed left frontal cortical and subcortical infarcts.  TEE suggested very small PFO.  Hypercoagulable workup was unrevealing.  Cardiology did not suggest any indications of PFO closure at the time.  Patient was discharged on dual antiplatelet therapy. Patient was subsequently admitted in [place] in May 2022 with aphasia and right-sided weakness status post tPA.  CTA of the head and neck showed left M1 occlusion TEE unremarkable.  Patient was noted to have a right external iliac venous thrombosis.  Patient was placed on Eliquis at this time. In April 2023 patient developed left-sided numbness and slurred speech MRI showed large right cerebellar infarct with the patient having been off Eliquis for about a month.  Urine drug screen was positive for cocaine.  Loop recorder was placed to evaluate for atrial fibrillation. In July 2023 the patient had repeat lower extremity venous Doppler ultrasound that was negative for DVT.  He also was seen by Dr. Royann Shivers cardiology loop recorder showed no A-fib and TEE reviewed showed no PFO. In August 2023 the patient was admitted with dysarthria and MRI showed small acute cortical infarcts scattered in the right MCA  distribution.  Echo showed ejection fraction of 60 to 65%.  Loop recorder no A-fib.  Patient apparently had repeat hypercoagulability workup which was unrevealing.  Patient was discharged on aspirin plus therapeutic dose of Eliquis and recommended smoking cessation.  Patient on this hospitalization is now admitted with headache and right hemianopsia with MRI showing early/acute.  Acute cortical ischemia throughout the left occipital lobe with no hemorrhage or mass effect.  Multifocal chronic microhemorrhages and subarachnoid siderosis.  Old infarcts in the right cerebellum and left frontal lobe.  Neurology is following the patient and the patient is currently on aspirin and has been started on Coumadin instead of her Eliquis at this time.  Hematology was consulted for any other additional recommendations.  Patient reports that she does not remember the details of her DVT but notes that there were no specific provoking factors noted below it appears that she might have this during one of her previous strokes.  She was not recommended long-term anticoagulation for this.  Patient reports that she has not remained compliant with aspirin and her Eliquis prior to this hospitalization.  No new chest pain or shortness of breath.  No new leg pain or swelling. Lower extremity ultrasound venous on 08/22/2022 showed no evidence of lower extremity DVT. Echo shows left ventricle ejection fraction of 65 to 70% no regional wall motion normalities.  Grade 1 diastolic dysfunction.  Mild concentric LVH.  Patient notes she has some distal relatives with sickle cell disease but she has never been diagnosed with sickle cell disease..  She still has been smoking about half a pack a day of cigarettes. Denies any current cocaine or other drug abuse.   OBJECTIVE:  NAD  PHYSICAL EXAMINATION: . Vitals:   08/22/22 0704 08/22/22 0715 08/22/22 0914 08/22/22 0930  BP:  127/78    Pulse:  86  97  Resp:  (!) 23  (!) 21   Temp: 98.2 F (36.8 C)  98.3 F (36.8 C)   TempSrc: Oral  Oral   SpO2:  97%  100%   Filed Weights   08/22/22 1342  Weight: 244 lb 0.8 oz (110.7 kg)   .Body mass index is 39.39 kg/m.  GENERAL:alert, in no acute distress and comfortable SKIN: skin color, texture, turgor are normal, no rashes or significant lesions EYES: normal, conjunctiva are pink and non-injected, sclera clear OROPHARYNX:no exudate, no erythema and lips, buccal mucosa, and tongue normal  NECK: supple, no JVD, thyroid normal size, non-tender, without nodularity LYMPH:  no palpable lymphadenopathy in the cervical, axillary or inguinal LUNGS: clear to auscultation with normal respiratory effort HEART: regular rate & rhythm,  no murmurs and no lower extremity edema ABDOMEN: abdomen soft, non-tender, normoactive bowel sounds , no palpable hepatosplenomegaly. Musculoskeletal: no cyanosis of digits and no clubbing  PSYCH: alert & oriented x 3 with fluent speech NEURO: no focal motor/sensory deficits  MEDICAL HISTORY:  Past Medical History:  Diagnosis Date   Acute deep vein thrombosis of right iliac vein 09/14/2020   Stroke   Prediabetes Hypertension Dyslipidemia Tobacco abuse Obesity History of prior cocaine abuse Obstructive sleep apnea Previous DVT Hypothyroidism'   History of strokes Remote bilateral frontal and left cerebellar infarct 01/2020 admitted for right-sided weakness, aphasia and right facial/arm numbness.  CT showed subacute left frontal infarct.  CTA head and neck and CT perfusion negative.  MRI showed left frontal cortical and subcortical infarcts.  MRA head and neck negative.  EEG negative.  EF 55 to 60%.  TCD insignificant HITS.  LE venous Doppler no DVT.  TEE showed very small PFO.  CSF analysis negative.  LDL 125, A1c 5.8.  Hypercoag workup only showed antiphospholipid IgM 14, slightly elevated.  Discussed with cardiology, no indication for PFO closure.  Patient discharged on DAPT and Lipitor  40. 09/2020 admitted in [place] for aphasia and right-sided weakness.  Status post tPA.  CTA head and neck left M1 occlusion.  Status post IR with TICI2b reperfusion.  2D echo and TEE unremarkable.  However, LE venous Doppler showed positive right external iliac vein thrombosis.  Put on Eliquis.  Patient had residual right-sided weakness and speech difficulty. 08/2021 patient was off Eliquis for 1 months and then developed left-sided numbness and slurred speech.  MRI showed large right cerebellar infarct.  CTA neck showed right SCA occlusion.  TTE unremarkable.  UDS positive for cocaine.  Loop recorder placed. 11/14/2021 follow-up [facility] with Ihor Austin, repeat LE vein Doppler negative for DVT.  Patient was off Eliquis for the last 2 weeks. 12/04/2021 follow-up with Dr. Rubie Maid cardiology, loop recorder no A-fib and reviewed TEE in the past showed no PFO. 12/2021 admitted for dysarthria.  MRI showed small acute cortical infarcts scattered in the right MCA distribution.  CT head and neck unremarkable.  EF 60 to 65%.  Loop recorder no A-fib.  LDL 61, A1c 6.2.  Repeat hypercoag workup again unremarkable.  Patient prefer Eliquis as she had frequent travel.  Discharged on aspirin 81 and Eliquis 5 mg twice daily as well as Lipitor and Zetia.  Recommend smoking cessation. Follow-up in [facility] in 01/2022, doing better.  Patient stated that she is compliant with Eliquis.  SURGICAL HISTORY: Past Surgical History:  Procedure Laterality Date   BUBBLE STUDY  01/29/2020   Procedure: BUBBLE STUDY;  Surgeon: Little Ishikawa, MD;  Location: [facility] ENDOSCOPY;  Service: Cardiovascular;;   TEE WITHOUT CARDIOVERSION N/A 01/29/2020   Procedure: TRANSESOPHAGEAL ECHOCARDIOGRAM (TEE);  Surgeon: Little Ishikawa, MD;  Location: [facility] ENDOSCOPY;  Service: Cardiovascular;  Laterality: N/A;    SOCIAL HISTORY: Social History   Socioeconomic History   Marital status: Single    Spouse name: Not on file   Number of  children: Not on file   Years of education: Not on file   Highest education level: Not on file  Occupational History   Not on file  Tobacco Use   Smoking status: Every Day    Packs/day: .5    Types: Cigarettes    Start date: 03/07/2020    Last attempt to quit: 08/30/2021    Years since quitting: 0.9   Smokeless tobacco: Never   Tobacco comments:    1 pk per day.   Stopped x 1 month age    12/04/2021 patient smokes 4 cigarettes daily  Substance and Sexual Activity   Alcohol use: Yes   Drug use: Not on file   Sexual activity: Not on file  Other Topics Concern   Not on file  Social History Narrative   Lives with sister   Left Handed   Drinks 2-3 cups caffeine daily   Social Determinants of Health   Financial Resource Strain: High Risk (11/15/2021)   Overall Financial Resource Strain (CARDIA)    Difficulty of Paying Living Expenses: Very hard  Food Insecurity: No Food Insecurity (08/21/2022)   Hunger Vital Sign    Worried About Running Out of Food in the Last Year: Never true    Ran Out of Food in the Last Year: Never true  Transportation Needs: No Transportation Needs (08/21/2022)   PRAPARE - Administrator, Civil Service (Medical): No    Lack of Transportation (Non-Medical): No  Physical Activity: Not on file  Stress: Not on file  Social Connections: Not on file  Intimate Partner Violence: Not At Risk (08/21/2022)   Humiliation, Afraid, Rape, and Kick questionnaire    Fear of Current or Ex-Partner: No    Emotionally Abused: No    Physically Abused: No    Sexually Abused: No    FAMILY HISTORY: Family History  Problem Relation Age of Onset   Sleep apnea Neg Hx     ALLERGIES:  has No Known Allergies.  MEDICATIONS:  Scheduled Meds:  apixaban  5 mg Oral BID   aspirin EC  81 mg Oral Daily   atorvastatin  80 mg Oral Daily   ezetimibe  10 mg Oral Daily   levothyroxine  175 mcg Oral Q0600   pantoprazole  40 mg Oral Daily   Continuous Infusions: PRN  Meds:.albuterol, polyethylene glycol  REVIEW OF SYSTEMS:    10 Point review of Systems was done is negative except as noted above.   LABORATORY DATA:  I have reviewed the data as listed .    Latest Ref Rng & Units 08/22/2022    5:20 AM 08/21/2022    5:55 PM 08/21/2022    5:51 PM  CBC  WBC 4.0 - 10.5 K/uL 8.6   7.7   Hemoglobin 12.0 - 15.0 g/dL 09.8  11.9  14.7   Hematocrit 36.0 - 46.0 % 39.4  45.0  43.1   Platelets 150 - 400 K/uL 410   399    .  Latest Ref Rng & Units 08/22/2022    5:20 AM 08/21/2022    5:55 PM 08/21/2022    5:51 PM  CMP  Glucose 70 - 99 mg/dL 161  096  045   BUN 6 - 20 mg/dL 5  8  8    Creatinine 0.44 - 1.00 mg/dL 4.09  8.11  9.14   Sodium 135 - 145 mmol/L 136  139  138   Potassium 3.5 - 5.1 mmol/L 4.0  3.9  3.8   Chloride 98 - 111 mmol/L 103  104  102   CO2 22 - 32 mmol/L 24   24   Calcium 8.9 - 10.3 mg/dL 9.3   9.6   Total Protein 6.5 - 8.1 g/dL   8.4   Total Bilirubin 0.3 - 1.2 mg/dL   0.4   Alkaline Phos 38 - 126 U/L   140   AST 15 - 41 U/L   27   ALT 0 - 44 U/L   35    Component     Latest Ref Rng 12/11/2021  Anticardiolipin Ab,IgA,Qn     0 - 11 APL U/mL <9   Anticardiolipin Ab,IgG,Qn     0 - 14 GPL U/mL <9   Anticardiolipin Ab,IgM,Qn     0 - 12 MPL U/mL <9   PTT Lupus Anticoagulant     0.0 - 43.5 sec 32.0   DRVVT     0.0 - 47.0 sec 31.0   Phosphatydalserine, IgG     0 - 30 Units 9   Phosphatydalserine, IgM     0 - 30 Units 16   Phosphatydalserine, IgA     0 - 19 APS Units <1   Lupus Anticoag Interp Comment: (C)  Beta-2 Glycoprotein I Ab, IgG     0 - 20 GPI IgG units <9   Beta-2-Glycoprotein I IgM     0 - 32 GPI IgM units <9   Beta-2-Glycoprotein I IgA     0 - 25 GPI IgA units <9   Recommendations-F5LEID: Comment   Reviewed By: Comment   Recommendations-PTGENE: Comment   Reviewed by Comment   Antithrombin Activity     75 - 120 % 115   Protein C-Functional     73 - 180 % 124   Protein C, Total     60 - 150 % 110   Protein  S-Functional     63 - 140 % 79   Protein S, Total     60 - 150 % 86   Homocysteine     0.0 - 14.5 umol/L 11.7     Legend: (C) Corrected  Lupus anticoagulant negative.  RADIOGRAPHIC STUDIES: I have personally reviewed the radiological images as listed and agreed with the findings in the report. MR BRAIN WO CONTRAST  Result Date: 08/21/2022 CLINICAL DATA:  Stroke follow-up EXAM: MRI HEAD WITHOUT CONTRAST TECHNIQUE: Multiplanar, multiecho pulse sequences of the brain and surrounding structures were obtained without intravenous contrast. COMPARISON:  None Available. FINDINGS: Brain: Cortical ischemia throughout the left occipital lobe. Multifocal chronic microhemorrhage in subarachnoid siderosis. No acute hemorrhage. Old infarcts of the right cerebellum and left frontal lobe. There is multifocal hyperintense T2-weighted signal within the white matter. Generalized volume loss. The midline structures are normal. Vascular: Major flow voids are preserved. Skull and upper cervical spine: Normal calvarium and skull base. Visualized upper cervical spine and soft tissues are normal. Sinuses/Orbits:No paranasal sinus fluid levels or advanced mucosal thickening. No mastoid or middle ear effusion. Normal  orbits. IMPRESSION: 1. Acute/early subacute cortical ischemia throughout the left occipital lobe. No acute hemorrhage or mass effect. 2. Multifocal chronic microhemorrhage and subarachnoid siderosis. 3. Old infarcts of the right cerebellum and left frontal lobe. Electronically Signed   By: Deatra Robinson M.D.   On: 08/21/2022 22:34   DG CHEST PORT 1 VIEW  Result Date: 08/21/2022 CLINICAL DATA:  Loss of vision, acute CVA EXAM: PORTABLE CHEST 1 VIEW COMPARISON:  None Available. FINDINGS: Hazy opacity over the left lower lung is favored projectional due to soft tissue overlap. No focal consolidation, pleural effusion, or pneumothorax. Normal cardiomediastinal silhouette. IMPRESSION: No active disease.  Electronically Signed   By: Minerva Fester M.D.   On: 08/21/2022 21:04   CT ANGIO HEAD NECK W WO CM  Result Date: 08/21/2022 CLINICAL DATA:  Loss of vision EXAM: CT ANGIOGRAPHY HEAD AND NECK WITH AND WITHOUT CONTRAST TECHNIQUE: Multidetector CT imaging of the head and neck was performed using the standard protocol during bolus administration of intravenous contrast. Multiplanar CT image reconstructions and MIPs were obtained to evaluate the vascular anatomy. Carotid stenosis measurements (when applicable) are obtained utilizing NASCET criteria, using the distal internal carotid diameter as the denominator. RADIATION DOSE REDUCTION: This exam was performed according to the departmental dose-optimization program which includes automated exposure control, adjustment of the mA and/or kV according to patient size and/or use of iterative reconstruction technique. CONTRAST:  75 mL Omnipaque 350 COMPARISON:  12/10/2021 CTA head neck FINDINGS: CT HEAD FINDINGS For noncontrast findings, please see same day CT head. CTA NECK FINDINGS Aortic arch: Two-vessel arch with a common origin of the brachiocephalic and left common carotid arteries. Imaged portion shows no evidence of aneurysm or dissection. No significant stenosis of the major arch vessel origins. Right carotid system: No evidence of dissection, occlusion, or hemodynamically significant stenosis (greater than 50%). Left carotid system: No evidence of dissection, occlusion, or hemodynamically significant stenosis (greater than 50%). Vertebral arteries: Right dominant system. No evidence of dissection, occlusion, or hemodynamically significant stenosis (greater than 50%). Skeleton: No acute osseous abnormality. Degenerative changes in the cervical spine. Other neck: Negative. Upper chest: No focal pulmonary opacity or pleural effusion. Review of the MIP images confirms the above findings CTA HEAD FINDINGS Anterior circulation: Both internal carotid arteries are  patent to the termini, without significant stenosis. A1 segments patent. Normal anterior communicating artery. Anterior cerebral arteries are patent to their distal aspects without significant stenosis. No M1 stenosis or occlusion. MCA branches perfused to their distal aspects without significant stenosis. Posterior circulation: Vertebral arteries patent to the vertebrobasilar junction without significant stenosis. Posterior inferior cerebellar arteries patent proximally. Basilar patent to its distal aspect without significant stenosis. Superior cerebellar arteries patent proximally. Patent right P1. Fetal origin of the left PCA from the left posterior communicating artery. PCAs perfused to their distal aspects without significant stenosis. The bilateral posterior communicating arteries are not visualized. Venous sinuses: As permitted by contrast timing, patent. Anatomic variants: Fetal origin of the left PCA. Review of the MIP images confirms the above findings IMPRESSION: 1. No intracranial large vessel occlusion or significant stenosis. 2. No hemodynamically significant stenosis in the neck. Imaging results were communicated on 08/21/2022 at 6:13 pm to provider Dr. Otelia Limes via secure text paging. Electronically Signed   By: Wiliam Ke M.D.   On: 08/21/2022 18:13   CT HEAD CODE STROKE WO CONTRAST  Result Date: 08/21/2022 CLINICAL DATA:  Code stroke.  Vision loss EXAM: CT HEAD WITHOUT CONTRAST TECHNIQUE: Contiguous axial images were  obtained from the base of the skull through the vertex without intravenous contrast. RADIATION DOSE REDUCTION: This exam was performed according to the departmental dose-optimization program which includes automated exposure control, adjustment of the mA and/or kV according to patient size and/or use of iterative reconstruction technique. COMPARISON:  12/09/2021 CTA head and neck FINDINGS: Brain: New hypodensity and loss of gray-white differentiation in the left occipital and  parietal lobe (series 3, images 20-23 and series 6, image 36-42). Redemonstrated hypodensity in the left anterior frontal lobe and right cerebellum. No evidence of acute hemorrhage, mass, mass effect, or midline shift. No hydrocephalus or extra-axial collection. Vascular: No hyperdense vessel. Skull: Negative for fracture or focal lesion. Sinuses/Orbits: No acute finding. Other: The mastoid air cells are well aerated. ASPECTS (Alberta Stroke Program Early CT Score) - Ganglionic level infarction (caudate, lentiform nuclei, internal capsule, insula, M1-M3 cortex): 7 - Supraganglionic infarction (M4-M6 cortex): 3 Total score (0-10 with 10 being normal): 10 IMPRESSION: New hypodensity and loss of gray-white differentiation in the left occipital and parietal lobes, concerning for acute infarct. No acute intracranial hemorrhage. Imaging results were communicated on 08/21/2022 at 5:55 pm to provider Dr. Otelia Limes via secure text paging. Electronically Signed   By: Wiliam Ke M.D.   On: 08/21/2022 17:57    ASSESSMENT & PLAN:   47 y.o. female with:  Acute left occipital CVA with previous h/o left frontal and rt cerebellar CVA. Patient apparently had this CVA while on Aspirin + Eliquis. Patient notes compliance with medications UDS neg for cocaine. Still smoking tobacco Possible Hypercoagulable state- prior workup unrevealing History of right iliac veinous thrombus 2022- possible unprovoked. Tobacco use disorder Hyperlipidemia Obesity  Hypothyroidism  Prediabetes  Subclinical hypothyroidism  PLAN:  -Discussed lab results on 08/22/2022 with patient in detail. CBC showed WBC of 8.6K, hemoglobin of 13.3, and platelets of 410K. -Patient's available lab results and imaging studies were discussed in detail.  We reviewed her previous hypercoagulability workup.  Which was unrevealing. -Patient's labs show no overt polycythemia or significant thrombocytosis to suggest a myeloproliferative neoplasm.. -Elevated  total protein level -would check a myeloma panel. -Patient has already been started on aspirin plus Coumadin per neurology which is reasonable choice. -Irrespective of the hypercoagulability testing given the patient's recurrent CVAs which could be embolic or possibly thrombotic in multiple vascular territories patient might benefit from a multi target anticoagulant like Coumadin. -Workup to evaluate for vasculitis neurology -Normal hemoglobin electrophoresis to rule out sickle cell disease or other hemoglobinopathies. -Patient was counseled on absolute smoking cessation. -Optimize treatment of sleep apnea. -To check for plasminogen activator inhibitor PAI gene polymorphism's -Further interventions per neurology. -Currently there is no DVT on ultrasound of the lower extremities. -Discussed general DVT prevention strategies including good hydration and mobility and complete smoking cessation.   The total time spent in the appointment was 81 minutes* .  All of the patient's questions were answered with apparent satisfaction. The patient knows to call the clinic with any problems, questions or concerns.   Wyvonnia Lora MD MS AAHIVMS [facility] Hematology/Oncology Physician [facility]  .*Total Encounter Time as defined by the Centers for Medicare and Medicaid Services includes, in addition to the face-to-face time of a patient visit (documented in the note above) non-face-to-face time: obtaining and reviewing outside history, ordering and reviewing medications, tests or procedures, care coordination (communications with other health care professionals or caregivers) and documentation in the medical record.    I,Mitra Faeizi,acting as a Neurosurgeon for Dr Rance Muir  Candise Che MD   .I have reviewed the above documentation for accuracy and completeness, and I agree with the above. Wyvonnia Lora MD

## 2022-08-22 NOTE — Progress Notes (Signed)
Subjective:  Maria Porter was feeling good this morning, states that her headache from last night has completely resolved. Expressed thorough understanding of medical history and reason for current hospitalization. Reports taking apixaban and other medications regularly including throughout last few weeks. Discussed plan to continue current medications and proceed with hypercoagulability workup including hematology consult. All questions were answered.     Objective: Vitals over previous 24hr: Vitals:   08/22/22 0530 08/22/22 0545 08/22/22 0600 08/22/22 0615  BP: (!) 150/97 (!) 139/94 115/63 112/71  Pulse: 80 86 84 79  Resp: 18 20 (!) 22 (!) 23  Temp:      TempSrc:      SpO2: 97% 99% 96% 99%    General:      awake and alert, lying comfortably in bed, cooperative, not in acute distress Skin:       warm and dry, intact without any obvious lesions or scars, no rashes Eyes:      extraocular movements intact, pupils round and reactive to light Lungs:      normal respiratory effort, breathing unlabored, symmetrical chest rise Cardiac:      regular rate and rhythm, normal S1 and S2, capillary no pitting edema Abdomen:      soft and non-distended, normoactive bowel sounds, no tenderness to palpation or guarding Musculoskeletal:  motor strength 5/5 in LUE-LLE and 4/5 RUE-RLE Neurologic:      oriented to person-place-time, right homonomous hemianopsia, CN II-XI intact with diminished sensation across CN V2 distribution on L, sensation to light touch intact and symmetric across all four extremities Psychiatric:      euthymic mood with congruent affect, intelligible speech with slight [place] accent    Assessment/Plan: Maria Lodwick is a 47 year old female with a past medical history of obesity, hyperlipidemia, DVT, and multiple CVAs who presented with acute bilateral vision loss in peripheral right fields and an associated headache, now admitted for management of stroke.    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 Extensive hypercoagulability workup in the past largely unremarkable, notable only for slightly elevated antiphospholipid IgM level and possible PFO. Only significant risk factor is smoking. Current medications include aspirin, apixaban, atorvastatin, and ezetimibe. Etiology remains unclear, possibly embolic strokes of undetermined source. Presented on 4-16 with right-sided vision loss and headache. Upon arrival, brain MRI revealed subacute cortical ischemia involving occipital lobe. Exam notable for right homonymous hemianopsia. Home medication regimen of aspirin, apixaban, and atorvastatin continued at admission. Neurology recommending modified permissive hypertension protocol. Evaluation by physical, occupational, and speech currently pending.  > Neurology consult, appreciate recommendations  > Apixaban  q12  > Atorvastatin  q24  > Ezetimibe  q24  > Physical, occupational, and speech evaluation  > Monitor BP, systolic goal <180 until 4-18  > Echocardiogram with bubble study   ---Hypercoagulable state  ---History of right iliac veinous thrombus 2022 Patient has history of multiple strokes since 2021 and deep vein thrombosis in 2022. Prior hypercoagulability workup has been largely unremarkable, notable only for a slightly elevated antiphospholipid IgM level. Adherence to aspirin, apixaban, and atorvastatin has been good per patient. Repeat laboratory testing during current hospitalization is warranted to identify underlying cause, which remains unclear possibly antiphospholipid syndrome. Notable findings include elevated ESR and CRP. Given recent stroke despite anticoagulation therapy, hematology team has been consulted.  > Hematology consult,  appreciate recommendations  >  Apixaban  q12  > Check ANA and ANCA antibodies  > Check C3 and C4   ---Hyperlipidemia  ---Obesity Patient has history of obesity and hyperlipidemia. Home medications include atorvastatin and ezetimibe, which she reports taking daily. Upon arrival, lipid panel notable for TChol 114 and LDL 63. Atorvastatin continued upon admission.  > Atorvastatin  q24  > Ezetimibe  q24   ---Hypothyroidism Patient has history of hypothyroidism managed with levothyroxine, which she takes daily. Previous TSH collected 12-2021 was 17. Upon arrival, TSH was 21. Given acute illness, current levothyroxine dose was continued on admission. Subsequent free T4 level is currently pending. Recommend further monitoring and titration in the outpatient setting.  > Levothyroxine 175ug q24  > Check fT4   ---Tobacco use Patient has been smoking cigarettes for over thirty years, recently abstained for about 16mo but then resumed 16mo ago. Previously used 1ppd and currently smokes about five cigarettes per day. Nicotine patches have been somewhat helpful in the past. Understands that tobacco use places her at higher risk for strokes, she has expressed the desire to quit entirely. Upon arrival, chest radiograph was unremarkable.  > Encourage smoking cessation  > Consider nicotine patches as outpatient   ---Prediabetes Patient has history of prediabetes dating back to at least 2021 per electronic medical record. Her A1c has been gradually increasing since then and is currently 6.3.   > Encourage diet and lifestyle modifications    Principal Problem:   Acute CVA (cerebrovascular accident) Active Problems:   Tobacco use disorder   Hyperlipemia   Subclinical hypothyroidism   History of DVT (deep vein thrombosis)    Prior to Admission Living Arrangement: home alone  Anticipated Discharge Location: home Barriers to Discharge: etiologic workup and functional assessment Dispo:  Anticipated discharge in approximately 2-3 day(s).    Lajuana Ripple, MD Internal Medicine PGY-1 Pager [phone]  After 5pm on weekdays and 1pm on weekends: On Call pager [phone]

## 2022-08-23 DIAGNOSIS — I639 Cerebral infarction, unspecified: Secondary | ICD-10-CM | POA: Diagnosis not present

## 2022-08-23 DIAGNOSIS — D6869 Other thrombophilia: Secondary | ICD-10-CM

## 2022-08-23 LAB — CBC
HCT: 42.5 % (ref 36.0–46.0)
Hemoglobin: 13.7 g/dL (ref 12.0–15.0)
MCH: 27.7 pg (ref 26.0–34.0)
MCHC: 32.2 g/dL (ref 30.0–36.0)
MCV: 85.9 fL (ref 80.0–100.0)
Platelets: 446 10*3/uL — ABNORMAL HIGH (ref 150–400)
RBC: 4.95 MIL/uL (ref 3.87–5.11)
RDW: 12.7 % (ref 11.5–15.5)
WBC: 8.1 10*3/uL (ref 4.0–10.5)
nRBC: 0 % (ref 0.0–0.2)

## 2022-08-23 LAB — BASIC METABOLIC PANEL
Anion gap: 10 (ref 5–15)
BUN: 5 mg/dL — ABNORMAL LOW (ref 6–20)
CO2: 23 mmol/L (ref 22–32)
Calcium: 9.3 mg/dL (ref 8.9–10.3)
Chloride: 103 mmol/L (ref 98–111)
Creatinine, Ser: 0.77 mg/dL (ref 0.44–1.00)
GFR, Estimated: >60 mL/min (ref >60)
Glucose, Bld: 137 mg/dL — ABNORMAL HIGH (ref 70–99)
Potassium: 3.8 mmol/L (ref 3.5–5.1)
Sodium: 136 mmol/L (ref 135–145)

## 2022-08-23 LAB — PROTIME-INR
INR: 1.1 (ref 0.8–1.2)
Prothrombin Time: 13.9 seconds (ref 11.4–15.2)

## 2022-08-23 LAB — GLUCOSE, CAPILLARY: Glucose-Capillary: 219 mg/dL — ABNORMAL HIGH (ref 70–99)

## 2022-08-23 LAB — ANA W/REFLEX IF POSITIVE: Anti Nuclear Antibody (ANA): NEGATIVE

## 2022-08-23 LAB — ANCA TITERS
Atypical P-ANCA titer: <1:20 {titer}
C-ANCA: <1:20 {titer}
P-ANCA: <1:20 {titer}

## 2022-08-23 LAB — C3 COMPLEMENT: C3 Complement: 241 mg/dL — ABNORMAL HIGH (ref 82–167)

## 2022-08-23 LAB — C4 COMPLEMENT: Complement C4, Body Fluid: 54 mg/dL — ABNORMAL HIGH (ref 12–38)

## 2022-08-23 MED ORDER — ACETAMINOPHEN 500 MG PO TABS
1000.0000 mg | ORAL_TABLET | Freq: Three times a day (TID) | ORAL | Status: DC | PRN
  Administered 2022-08-23 (×2): 1000 mg via ORAL
  Filled 2022-08-23 (×2): qty 2

## 2022-08-23 MED ORDER — ENOXAPARIN SODIUM 120 MG/0.8ML IJ SOSY
110.0000 mg | PREFILLED_SYRINGE | Freq: Two times a day (BID) | INTRAMUSCULAR | Status: DC
  Administered 2022-08-23 – 2022-08-24 (×3): 110 mg via SUBCUTANEOUS
  Filled 2022-08-23 (×4): qty 0.74

## 2022-08-23 MED ORDER — WARFARIN SODIUM 5 MG PO TABS
10.0000 mg | ORAL_TABLET | Freq: Once | ORAL | Status: AC
  Administered 2022-08-23: 10 mg via ORAL
  Filled 2022-08-23: qty 2

## 2022-08-23 NOTE — Progress Notes (Signed)
STROKE TEAM PROGRESS NOTE   SUBJECTIVE (INTERVAL HISTORY) No family is at the bedside.  Patient still has right hemianopia, however headache is gone.  She stated that she compliant with Eliquis 5 mg twice daily at home.  Discussed with her regarding Coumadin and she is in agreement.   OBJECTIVE Temp:  [97.5 F (36.4 C)-99.3 F (37.4 C)] 97.7 F (36.5 C) (04/18 1531) Pulse Rate:  [72-94] 72 (04/18 1531) Cardiac Rhythm: Normal sinus rhythm (04/18 0955) Resp:  [18-19] 18 (04/18 1531) BP: (122-155)/(77-106) 132/94 (04/18 1531) SpO2:  [91 %-100 %] 99 % (04/18 1531) Weight:  [112.1 kg] 112.1 kg (04/18 0345)  Recent Labs  Lab 08/21/22 1740 08/23/22 0826  GLUCAP 107* 219*   Recent Labs  Lab 08/21/22 1751 08/21/22 1755 08/22/22 0520 08/23/22 0726  NA 138 139 136 136  K 3.8 3.9 4.0 3.8  CL 102 104 103 103  CO2 24  --  24 23  GLUCOSE 109* 105* 127* 137*  BUN 8 8 5* 5*  CREATININE 0.93 0.70 0.81 0.77  CALCIUM 9.6  --  9.3 9.3   Recent Labs  Lab 08/21/22 1751  AST 27  ALT 35  ALKPHOS 140*  BILITOT 0.4  PROT 8.4*  ALBUMIN 3.9   Recent Labs  Lab 08/21/22 1751 08/21/22 1755 08/22/22 0520 08/23/22 0726  WBC 7.7  --  8.6 8.1  NEUTROABS 4.0  --   --   --   HGB 14.1 15.3* 13.3 13.7  HCT 43.1 45.0 39.4 42.5  MCV 86.5  --  84.7 85.9  PLT 399  --  410* 446*   No results for input(s): "CKTOTAL", "CKMB", "CKMBINDEX", "TROPONINI" in the last 168 hours. Recent Labs    08/21/22 1751 08/23/22 0726  LABPROT 14.0 13.9  INR 1.1 1.1   Recent Labs    08/21/22 2057  COLORURINE YELLOW  LABSPEC >1.046*  PHURINE 5.0  GLUCOSEU NEGATIVE  HGBUR NEGATIVE  BILIRUBINUR NEGATIVE  KETONESUR NEGATIVE  PROTEINUR NEGATIVE  NITRITE NEGATIVE  LEUKOCYTESUR NEGATIVE       Component Value Date/Time   CHOL 114 08/22/2022 0520   TRIG 47 08/22/2022 0520   HDL 42 08/22/2022 0520   CHOLHDL 2.7 08/22/2022 0520   VLDL 9 08/22/2022 0520   LDLCALC 63 08/22/2022 0520   Lab Results   Component Value Date   HGBA1C 6.3 (H) 08/22/2022      Component Value Date/Time   LABOPIA NONE DETECTED 08/21/2022 2057   COCAINSCRNUR NONE DETECTED 08/21/2022 2057   LABBENZ NONE DETECTED 08/21/2022 2057   AMPHETMU NONE DETECTED 08/21/2022 2057   THCU NONE DETECTED 08/21/2022 2057   LABBARB NONE DETECTED 08/21/2022 2057    Recent Labs  Lab 08/21/22 1751  ETH <10    I have personally reviewed the radiological images below and agree with the radiology interpretations.  VAS [place] LOWER EXTREMITY VENOUS (DVT)  Result Date: 08/22/2022  Lower Venous DVT Study Patient Name:  MESSIAH ROVIRA  Date of Exam:   08/22/2022 Medical Rec #: [id number]        Accession #:    [id number] Date of Birth: 1976/04/27        Patient Gender: F Patient Age:   47 years Exam Location:  [facility] Procedure:      VAS [place] LOWER EXTREMITY VENOUS (DVT) Referring Phys: JEFFREY HATCHER --------------------------------------------------------------------------------  Indications: Stroke. Other Indications: History of DVT. Comparison Study: 11-21-2021 Most recent prior bilateral lower extremity venous  study was negative for DVT. Performing Technologist: Jean Rosenthal RDMS, RVT  Examination Guidelines: A complete evaluation includes B-mode imaging, spectral Doppler, color Doppler, and power Doppler as needed of all accessible portions of each vessel. Bilateral testing is considered an integral part of a complete examination. Limited examinations for reoccurring indications may be performed as noted. The reflux portion of the exam is performed with the patient in reverse Trendelenburg.  +---------+---------------+---------+-----------+----------+--------------+ RIGHT    CompressibilityPhasicitySpontaneityPropertiesThrombus Aging +---------+---------------+---------+-----------+----------+--------------+ CFV      Full           Yes      Yes                                  +---------+---------------+---------+-----------+----------+--------------+ SFJ      Full                                                        +---------+---------------+---------+-----------+----------+--------------+ FV Prox  Full                                                        +---------+---------------+---------+-----------+----------+--------------+ FV Mid   Full                                                        +---------+---------------+---------+-----------+----------+--------------+ FV DistalFull                                                        +---------+---------------+---------+-----------+----------+--------------+ PFV      Full                                                        +---------+---------------+---------+-----------+----------+--------------+ POP      Full           Yes      Yes                                 +---------+---------------+---------+-----------+----------+--------------+ PTV      Full                                                        +---------+---------------+---------+-----------+----------+--------------+ PERO     Full                                                        +---------+---------------+---------+-----------+----------+--------------+   +---------+---------------+---------+-----------+----------+--------------+  LEFT     CompressibilityPhasicitySpontaneityPropertiesThrombus Aging +---------+---------------+---------+-----------+----------+--------------+ CFV      Full           Yes      Yes                                 +---------+---------------+---------+-----------+----------+--------------+ SFJ      Full                                                        +---------+---------------+---------+-----------+----------+--------------+ FV Prox  Full                                                         +---------+---------------+---------+-----------+----------+--------------+ FV Mid   Full                                                        +---------+---------------+---------+-----------+----------+--------------+ FV DistalFull                                                        +---------+---------------+---------+-----------+----------+--------------+ PFV      Full                                                        +---------+---------------+---------+-----------+----------+--------------+ POP      Full           Yes      Yes                                 +---------+---------------+---------+-----------+----------+--------------+ PTV      Full                                                        +---------+---------------+---------+-----------+----------+--------------+ PERO     Full                                                        +---------+---------------+---------+-----------+----------+--------------+     Summary: RIGHT: - There is no evidence of deep vein thrombosis in the lower extremity.  - No cystic structure found in the popliteal fossa.  LEFT: - There is no evidence of deep vein thrombosis in the lower extremity.  - No  cystic structure found in the popliteal fossa.  *See table(s) above for measurements and observations. Electronically signed by Gerarda Fraction on 08/22/2022 at 10:33:42 PM.    Final    ECHOCARDIOGRAM COMPLETE BUBBLE STUDY  Result Date: 08/22/2022    ECHOCARDIOGRAM REPORT   Patient Name:   JUNELLE HASHEMI Date of Exam: 08/22/2022 Medical Rec #:  [id number]       Height:       66.0 in Accession #:    [id number]      Weight:       248.7 lb Date of Birth:  [birth year]       BSA:          2.194 m Patient Age:    47 years        BP:           119/76 mmHg Patient Gender: F               HR:           56 bpm. Exam Location:  Inpatient Procedure: 2D Echo, Color Doppler, Cardiac Doppler and Saline Contrast Bubble            Study  Indications:    Stroke  History:        Patient has prior history of Echocardiogram examinations, most                 recent 12/10/2021. DVT and Stroke; Risk Factors:Dyslipidemia. OSA.  Sonographer:    Milbert Coulter Referring Phys: [id number] JEFFREY C HATCHER  Sonographer Comments: Patient is obese. Image acquisition challenging due to patient body habitus and Image acquisition challenging due to respiratory motion. IMPRESSIONS  1. Left ventricular ejection fraction, by estimation, is 65 to 70%. The left ventricle has normal function. The left ventricle has no regional wall motion abnormalities. There is mild concentric left ventricular hypertrophy. Left ventricular diastolic parameters are consistent with Grade I diastolic dysfunction (impaired relaxation).  2. Right ventricular systolic function is normal. The right ventricular size is normal.  3. The mitral valve is normal in structure. No evidence of mitral valve regurgitation.  4. The aortic valve was not well visualized. Aortic valve regurgitation is not visualized. No aortic stenosis is present. Comparison(s): No significant change from prior study. Prior images reviewed side by side. FINDINGS  Left Ventricle: Left ventricular ejection fraction, by estimation, is 65 to 70%. The left ventricle has normal function. The left ventricle has no regional wall motion abnormalities. The left ventricular internal cavity size was normal in size. There is  mild concentric left ventricular hypertrophy. Left ventricular diastolic parameters are consistent with Grade I diastolic dysfunction (impaired relaxation). Right Ventricle: The right ventricular size is normal. No increase in right ventricular wall thickness. Right ventricular systolic function is normal. Left Atrium: Left atrial size was normal in size. Right Atrium: Right atrial size was normal in size. Pericardium: There is no evidence of pericardial effusion. Mitral Valve: The mitral valve is normal in structure. No  evidence of mitral valve regurgitation. Tricuspid Valve: The tricuspid valve is not well visualized. Tricuspid valve regurgitation is not demonstrated. Aortic Valve: The aortic valve was not well visualized. Aortic valve regurgitation is not visualized. No aortic stenosis is present. Aortic valve mean gradient measures 4.0 mmHg. Aortic valve peak gradient measures 7.5 mmHg. Aortic valve area, by VTI measures 1.70 cm. Pulmonic Valve: The pulmonic valve was not well visualized. Aorta: The ascending aorta was not well visualized. IAS/Shunts: No atrial level shunt detected by  color flow Doppler. Agitated saline contrast was given intravenously to evaluate for intracardiac shunting.  LEFT VENTRICLE PLAX 2D LVIDd:         3.00 cm   Diastology LVIDs:         1.90 cm   LV e' medial:    9.46 cm/s LV PW:         1.20 cm   LV E/e' medial:  6.7 LV IVS:        1.40 cm   LV e' lateral:   9.14 cm/s LVOT diam:     1.60 cm   LV E/e' lateral: 6.9 LV SV:         40 LV SV Index:   18 LVOT Area:     2.01 cm  RIGHT VENTRICLE RV S prime:     13.30 cm/s TAPSE (M-mode): 2.3 cm LEFT ATRIUM         Index LA diam:    2.80 cm 1.28 cm/m  AORTIC VALVE AV Area (Vmax):    1.69 cm AV Area (Vmean):   1.69 cm AV Area (VTI):     1.70 cm AV Vmax:           137.00 cm/s AV Vmean:          89.800 cm/s AV VTI:            0.234 m AV Peak Grad:      7.5 mmHg AV Mean Grad:      4.0 mmHg LVOT Vmax:         115.00 cm/s LVOT Vmean:        75.500 cm/s LVOT VTI:          0.198 m LVOT/AV VTI ratio: 0.85 MITRAL VALVE MV Area (PHT): 3.60 cm    SHUNTS MV Decel Time: 211 msec    Systemic VTI:  0.20 m MV E velocity: 63.40 cm/s  Systemic Diam: 1.60 cm MV A velocity: 94.30 cm/s MV E/A ratio:  0.67 Mihai Croitoru MD Electronically signed by Thurmon Fair MD Signature Date/Time: 08/22/2022/3:24:31 PM    Final    MR BRAIN WO CONTRAST  Result Date: 08/21/2022 CLINICAL DATA:  Stroke follow-up EXAM: MRI HEAD WITHOUT CONTRAST TECHNIQUE: Multiplanar, multiecho pulse  sequences of the brain and surrounding structures were obtained without intravenous contrast. COMPARISON:  None Available. FINDINGS: Brain: Cortical ischemia throughout the left occipital lobe. Multifocal chronic microhemorrhage in subarachnoid siderosis. No acute hemorrhage. Old infarcts of the right cerebellum and left frontal lobe. There is multifocal hyperintense T2-weighted signal within the white matter. Generalized volume loss. The midline structures are normal. Vascular: Major flow voids are preserved. Skull and upper cervical spine: Normal calvarium and skull base. Visualized upper cervical spine and soft tissues are normal. Sinuses/Orbits:No paranasal sinus fluid levels or advanced mucosal thickening. No mastoid or middle ear effusion. Normal orbits. IMPRESSION: 1. Acute/early subacute cortical ischemia throughout the left occipital lobe. No acute hemorrhage or mass effect. 2. Multifocal chronic microhemorrhage and subarachnoid siderosis. 3. Old infarcts of the right cerebellum and left frontal lobe. Electronically Signed   By: Deatra Robinson M.D.   On: 08/21/2022 22:34   DG CHEST PORT 1 VIEW  Result Date: 08/21/2022 CLINICAL DATA:  Loss of vision, acute CVA EXAM: PORTABLE CHEST 1 VIEW COMPARISON:  None Available. FINDINGS: Hazy opacity over the left lower lung is favored projectional due to soft tissue overlap. No focal consolidation, pleural effusion, or pneumothorax. Normal cardiomediastinal silhouette. IMPRESSION: No active disease. Electronically Signed   By:  Minerva Fester M.D.   On: 08/21/2022 21:04   CT ANGIO HEAD NECK W WO CM  Result Date: 08/21/2022 CLINICAL DATA:  Loss of vision EXAM: CT ANGIOGRAPHY HEAD AND NECK WITH AND WITHOUT CONTRAST TECHNIQUE: Multidetector CT imaging of the head and neck was performed using the standard protocol during bolus administration of intravenous contrast. Multiplanar CT image reconstructions and MIPs were obtained to evaluate the vascular anatomy. Carotid  stenosis measurements (when applicable) are obtained utilizing NASCET criteria, using the distal internal carotid diameter as the denominator. RADIATION DOSE REDUCTION: This exam was performed according to the departmental dose-optimization program which includes automated exposure control, adjustment of the mA and/or kV according to patient size and/or use of iterative reconstruction technique. CONTRAST:  75 mL Omnipaque 350 COMPARISON:  12/10/2021 CTA head neck FINDINGS: CT HEAD FINDINGS For noncontrast findings, please see same day CT head. CTA NECK FINDINGS Aortic arch: Two-vessel arch with a common origin of the brachiocephalic and left common carotid arteries. Imaged portion shows no evidence of aneurysm or dissection. No significant stenosis of the major arch vessel origins. Right carotid system: No evidence of dissection, occlusion, or hemodynamically significant stenosis (greater than 50%). Left carotid system: No evidence of dissection, occlusion, or hemodynamically significant stenosis (greater than 50%). Vertebral arteries: Right dominant system. No evidence of dissection, occlusion, or hemodynamically significant stenosis (greater than 50%). Skeleton: No acute osseous abnormality. Degenerative changes in the cervical spine. Other neck: Negative. Upper chest: No focal pulmonary opacity or pleural effusion. Review of the MIP images confirms the above findings CTA HEAD FINDINGS Anterior circulation: Both internal carotid arteries are patent to the termini, without significant stenosis. A1 segments patent. Normal anterior communicating artery. Anterior cerebral arteries are patent to their distal aspects without significant stenosis. No M1 stenosis or occlusion. MCA branches perfused to their distal aspects without significant stenosis. Posterior circulation: Vertebral arteries patent to the vertebrobasilar junction without significant stenosis. Posterior inferior cerebellar arteries patent proximally.  Basilar patent to its distal aspect without significant stenosis. Superior cerebellar arteries patent proximally. Patent right P1. Fetal origin of the left PCA from the left posterior communicating artery. PCAs perfused to their distal aspects without significant stenosis. The bilateral posterior communicating arteries are not visualized. Venous sinuses: As permitted by contrast timing, patent. Anatomic variants: Fetal origin of the left PCA. Review of the MIP images confirms the above findings IMPRESSION: 1. No intracranial large vessel occlusion or significant stenosis. 2. No hemodynamically significant stenosis in the neck. Imaging results were communicated on 08/21/2022 at 6:13 pm to provider Dr. Otelia Limes via secure text paging. Electronically Signed   By: Wiliam Ke M.D.   On: 08/21/2022 18:13   CT HEAD CODE STROKE WO CONTRAST  Result Date: 08/21/2022 CLINICAL DATA:  Code stroke.  Vision loss EXAM: CT HEAD WITHOUT CONTRAST TECHNIQUE: Contiguous axial images were obtained from the base of the skull through the vertex without intravenous contrast. RADIATION DOSE REDUCTION: This exam was performed according to the departmental dose-optimization program which includes automated exposure control, adjustment of the mA and/or kV according to patient size and/or use of iterative reconstruction technique. COMPARISON:  12/09/2021 CTA head and neck FINDINGS: Brain: New hypodensity and loss of gray-white differentiation in the left occipital and parietal lobe (series 3, images 20-23 and series 6, image 36-42). Redemonstrated hypodensity in the left anterior frontal lobe and right cerebellum. No evidence of acute hemorrhage, mass, mass effect, or midline shift. No hydrocephalus or extra-axial collection. Vascular: No hyperdense vessel. Skull: Negative for  fracture or focal lesion. Sinuses/Orbits: No acute finding. Other: The mastoid air cells are well aerated. ASPECTS ([place] Stroke Program Early CT Score) -  Ganglionic level infarction (caudate, lentiform nuclei, internal capsule, insula, M1-M3 cortex): 7 - Supraganglionic infarction (M4-M6 cortex): 3 Total score (0-10 with 10 being normal): 10 IMPRESSION: New hypodensity and loss of gray-white differentiation in the left occipital and parietal lobes, concerning for acute infarct. No acute intracranial hemorrhage. Imaging results were communicated on 08/21/2022 at 5:55 pm to provider Dr. Otelia Limes via secure text paging. Electronically Signed   By: Wiliam Ke M.D.   On: 08/21/2022 17:57     PHYSICAL EXAM  Temp:  [97.5 F (36.4 C)-99.3 F (37.4 C)] 97.7 F (36.5 C) (04/18 1531) Pulse Rate:  [72-94] 72 (04/18 1531) Resp:  [18-19] 18 (04/18 1531) BP: (122-155)/(77-106) 132/94 (04/18 1531) SpO2:  [91 %-100 %] 99 % (04/18 1531) Weight:  [112.1 kg] 112.1 kg (04/18 0345)  General - Well nourished, well developed, in no apparent distress.  Ophthalmologic - fundi not visualized due to noncooperation.  Cardiovascular - Regular rhythm and rate.  Neuro - awake, alert, eyes open, orientated to age, place, time and people. Slight word finding difficulty and expressive aphasia, following all simple commands, mild dysarthria. Able to name and repeat. No gaze palsy, tracking bilaterally, visual field exam showed right hemianopia. Slight chronic left facial droop. Tongue midline. LUE 5/5, RUE 5-/5, no drift, however, mild right hand dexterity difficulty. Bilaterally LEs 4+/5, no drift. Sensation symmetrical bilaterally, left FTN intact, right FTN mild dysmetria chornic, gait not tested.    ASSESSMENT/PLAN Ms. Berta Denson is a 47 y.o. female with history of recurrent cryptogenic strokes, DVT, obesity, hyperlipidemia, smoker admitted for headache and right hemianopia. No tPA given due to outside window and on Eliquis.    Stroke:  left PCA and MCA/PCA infarcts, embolic, cryptogenic etiology, likely due to nonspecific thrombophilia CT left occipital and  parietal hypodensity CT head and neck unremarkable MRI left PCA and MCA/PCA infarcts 2D Echo EF 65 to 70% Loop recorder interrogation no A-fib reported LE venous doppler no DVT LDL 63 HgbA1c 6.3 UDS negative Autoimmune work up negative except mildly elevated CRP and ESR Eliquis for VTE prophylaxis aspirin 81 mg daily and Eliquis (apixaban) daily prior to admission, now on aspirin 81 mg daily and Eliquis (apixaban) daily.  Discussed with patient, she is in agreement to transition to Coumadin with INR goal 2.5-3.5.  Recommend to bridge with Lovenox Patient counseled to be compliant with her antithrombotic medications Ongoing aggressive stroke risk factor management Therapy recommendations: Home health OT Disposition: Pending  History of strokes Remote bilateral frontal and left cerebellar infarct 01/2020 admitted for right-sided weakness, aphasia and right facial/arm numbness.  CT showed subacute left frontal infarct.  CTA head and neck and CT perfusion negative.  MRI showed left frontal cortical and subcortical infarcts.  MRA head and neck negative.  EEG negative.  EF 55 to 60%.  TCD insignificant HITS.  LE venous Doppler no DVT.  TEE showed very small PFO.  CSF analysis negative.  LDL 125, A1c 5.8.  Hypercoag workup only showed antiphospholipid IgM 14, slightly elevated.  Discussed with cardiology, no indication for PFO closure.  Patient discharged on DAPT and Lipitor 40. 09/2020 admitted in [place] for aphasia and right-sided weakness.  Status post tPA.  CTA head and neck left M1 occlusion.  Status post IR with TICI2b reperfusion.  2D echo and TEE unremarkable.  However, LE venous Doppler showed positive  right external iliac vein thrombosis.  Put on Eliquis.  Patient had residual right-sided weakness and speech difficulty. 08/2021 patient was off Eliquis for 1 months and then developed left-sided numbness and slurred speech.  MRI showed large right cerebellar infarct.  CTA neck showed  right SCA occlusion.  TTE unremarkable.  UDS positive for cocaine.  Loop recorder placed. 11/14/2021 follow-up [facility] with Ihor Austin, repeat LE vein Doppler negative for DVT.  Patient was off Eliquis for the last 2 weeks. 12/04/2021 follow-up with Dr. Rubie Maid cardiology, loop recorder no A-fib and reviewed TEE in the past showed no PFO. 12/2021 admitted for dysarthria.  MRI showed small acute cortical infarcts scattered in the right MCA distribution.  CT head and neck unremarkable.  EF 60 to 65%.  Loop recorder no A-fib.  LDL 61, A1c 6.2.  Repeat hypercoag workup again unremarkable.  Patient prefer Eliquis as she had frequent travel.  Discharged on aspirin 81 and Eliquis 5 mg twice daily as well as Lipitor and Zetia.  Recommend smoking cessation. Follow-up in [facility] in 01/2022, doing better.  Patient stated that she is compliant with Eliquis.  Pre diabetes HgbA1c 6.3 goal < 7.0 CBG monitoring while inpatient SSI DM education and close PCP follow up  Hypertension Stable Avoid low BP Long term BP goal normotensive  Hyperlipidemia Home meds: Lipitor 80 and Zetia LDL 63, goal < 70 Now on Lipitor 80 and Zetia Continue statin at discharge  Tobacco abuse Current intermittent smoker Smoking cessation counseling provided Pt is willing to quit completely   Other Stroke Risk Factors Obesity, Body mass index is 34.94 kg/m., BMI >/= 30 associated with increased stroke risk, recommend weight loss, diet and exercise as appropriate  Prior Hx cocaine use  OSA   Other Active Problems Hypothyroidism, on supplement  Hospital day # 2  Neurology will sign off. Please call with questions. Pt will follow up with stroke clinic NP Jessica at [facility] in about 4 weeks. Thanks for the consult.   Marvel Plan, MD PhD Stroke Neurology 08/23/2022 6:38 PM    To contact Stroke Continuity provider, please refer to [email or web address]. After hours, contact General Neurology

## 2022-08-23 NOTE — Evaluation (Addendum)
Occupational Therapy Evaluation Patient Details Name: Maria Porter MRN: [id number] DOB: 02-May-1976 Today's Date: 08/23/2022   History of Present Illness 47 y.o. female presents to [facility] hospital on 08/21/2022 with acute loss of vision on R side. CT shows evidence of acute infarcts in L occipital and parietal lobes. PMH includes recurrent cryptogenic strokes with R residual weakness, foreign accent syndrome, DVT, hypothyroidism, HLD.   Clinical Impression   PTA patient independent with ADLs, mobility and iADLs (but not driving).  She reports using quad cane for mobility, and calls uber for rides, gets groceries delivered. Admitted for above and limited by problem list below, including R sided visual field deficit. Pt educated on compensatory techniques for visual changes, provided handout and pt voices understanding; already utilizing visual scanning well during session.  She completes ADL, transfers and mobility in room with modified independence to independence.  Pt agreeable to simple meals without stove/oven for safety initially. Based on performance today recommend continued OT services after dc, for convenience recommend HHOT services but hope to progress to outpatient as able.       Recommendations for follow up therapy are one component of a multi-disciplinary discharge planning process, led by the attending physician.  Recommendations may be updated based on patient status, additional functional criteria and insurance authorization.   Assistance Recommended at Discharge PRN  Patient can return home with the following Assist for transportation    Functional Status Assessment  Patient has had a recent decline in their functional status and demonstrates the ability to make significant improvements in function in a reasonable and predictable amount of time.  Equipment Recommendations  None recommended by OT    Recommendations for Other Services       Precautions / Restrictions  Precautions Precautions: Fall Precaution Comments: chronic R weakness, R visual field cut Restrictions Weight Bearing Restrictions: No      Mobility Bed Mobility               General bed mobility comments: OOB upon entry    Transfers Overall transfer level: Modified independent                        Balance Overall balance assessment: Needs assistance Sitting-balance support: No upper extremity supported, Feet supported Sitting balance-Leahy Scale: Good     Standing balance support: No upper extremity supported, During functional activity, Single extremity supported Standing balance-Leahy Scale: Fair                             ADL either performed or assessed with clinical judgement   ADL Overall ADL's : Modified independent                                             Vision Baseline Vision/History: 1 Wears glasses (distance) Ability to See in Adequate Light: 1 Impaired Patient Visual Report: Peripheral vision impairment (R sided) Vision Assessment?: Yes Eye Alignment: Within Functional Limits Ocular Range of Motion: Within Functional Limits Alignment/Gaze Preference: Within Defined Limits Tracking/Visual Pursuits:  (difficulty tracking to R) Visual Fields: Right visual field deficit Additional Comments: patient with R visual field deficit, pt educated on compensatory techniques and demonstrates utilize scanning techniques with independence.     Perception     Praxis      Pertinent Vitals/Pain Pain Assessment  Pain Assessment: No/denies pain     Hand Dominance Left   Extremity/Trunk Assessment Upper Extremity Assessment Upper Extremity Assessment: RUE deficits/detail RUE Deficits / Details: chronic RUE weakness from prior CVA, appears to be at baseline per pt report RUE Coordination: decreased fine motor;decreased gross motor   Lower Extremity Assessment Lower Extremity Assessment: Defer to PT evaluation    Cervical / Trunk Assessment Cervical / Trunk Assessment: Normal   Communication Communication Communication: Expressive difficulties   Cognition Arousal/Alertness: Awake/alert Behavior During Therapy: WFL for tasks assessed/performed Overall Cognitive Status: Within Functional Limits for tasks assessed                                       General Comments  Discussed compensatory techniques for visual deficits. Provided handout and recommended she avoid cooking via stove/oven initally, pt agreeable to microwave and cold meals.    Exercises     Shoulder Instructions      Home Living Family/patient expects to be discharged to:: Private residence Living Arrangements: Alone Available Help at Discharge: Family;Available PRN/intermittently Type of Home: Apartment Home Access: Level entry     Home Layout: One level     Bathroom Shower/Tub: Producer, television/film/video: Handicapped height     Home Equipment: Cane - quad;Shower seat;Grab bars - tub/shower;Hand held shower head   Additional Comments: pt plans to move to her daughters home soon after discharge      Prior Functioning/Environment Prior Level of Function : Independent/Modified Independent             Mobility Comments: pt ambulates with use of [facility] ADLs Comments: Completes her own ADL and iADL, no longer drives, but arranges transportation, has groceries delivered.  Manages her medications.        OT Problem List: Decreased activity tolerance;Decreased coordination;Impaired vision/perception;Impaired UE functional use      OT Treatment/Interventions: Visual/perceptual remediation/compensation;Patient/family education;Therapeutic activities    OT Goals(Current goals can be found in the care plan section) Acute Rehab OT Goals Patient Stated Goal: get my vision better OT Goal Formulation: With patient Time For Goal Achievement: 09/06/22 Potential to Achieve Goals: Good  OT  Frequency: Min 2X/week    Co-evaluation              AM-PAC OT "6 Clicks" Daily Activity     Outcome Measure Help from another person eating meals?: None Help from another person taking care of personal grooming?: None Help from another person toileting, which includes using toliet, bedpan, or urinal?: None Help from another person bathing (including washing, rinsing, drying)?: None Help from another person to put on and taking off regular upper body clothing?: None Help from another person to put on and taking off regular lower body clothing?: None 6 Click Score: 24   End of Session Nurse Communication: Mobility status  Activity Tolerance: Patient tolerated treatment well Patient left: in chair;with call bell/phone within reach;Other (comment) (at sink)  OT Visit Diagnosis: Low vision, both eyes (H54.2);Other abnormalities of gait and mobility (R26.89);Hemiplegia and hemiparesis Hemiplegia - Right/Left: Right Hemiplegia - dominant/non-dominant: Non-Dominant Hemiplegia - caused by: Cerebral infarction (chronic)                Time: 6644-0347 OT Time Calculation (min): 21 min Charges:  OT General Charges $OT Visit: 1 Visit OT Evaluation $OT Eval Moderate Complexity: 1 Mod  Barry Brunner, OT Acute Rehabilitation [employer] [phone]  Chancy Milroy 08/23/2022, 11:12 AM

## 2022-08-23 NOTE — Progress Notes (Addendum)
ANTICOAGULATION CONSULT NOTE - follow-up  Pharmacy Consult for Warfarin w/ lovenox bridging therapy Indication:  multiple CVAs , h/o DVT  No Known Allergies  Patient Measurements: Height:  (167.6 cm) Weight: 112.1 kg (247 lb 2.2 oz) IBW/kg (Calculated) : 59.3  Vital Signs: Temp: 97.5 F (36.4 C) (04/18 0345) Temp Source: Oral (04/18 0345) BP: 125/80 (04/18 0554) Pulse Rate: 81 (04/18 0554)  Labs: Recent Labs    08/21/22 1751 08/21/22 1755 08/22/22 0520 08/23/22 0726  HGB 14.1 15.3* 13.3  --   HCT 43.1 45.0 39.4  --   PLT 399  --  410*  --   APTT 29  --   --   --   LABPROT 14.0  --   --  13.9  INR 1.1  --   --  1.1  CREATININE 0.93 0.70 0.81  --      Estimated Creatinine Clearance: 107.8 mL/min (by C-G formula based on SCr of 0.81 mg/dL).   Medical History: Past Medical History:  Diagnosis Date   Acute deep vein thrombosis of right iliac vein 09/14/2020   Stroke     Medications:  Medications Prior to Admission  Medication Sig Dispense Refill Last Dose   acetaminophen (TYLENOL) 500 MG tablet Take 1,000 mg by mouth daily as needed for moderate pain, fever or headache.   Past Week   albuterol (PROVENTIL) (2.5 MG/3ML) 0.083% nebulizer solution Take 3 mLs (2.5 mg total) by nebulization every 4 (four) hours as needed for wheezing or shortness of breath. 75 mL 2 Past Week   apixaban (ELIQUIS) 5 MG TABS tablet Take 1 tablet (5 mg total) by mouth 2 (two) times daily. 180 tablet 1 08/21/2022 at 1000   aspirin EC 81 MG tablet Take 1 tablet (81 mg total) by mouth daily. Swallow whole. 150 tablet 2 08/21/2022   atorvastatin (LIPITOR) 80 MG tablet Take 1 tablet (80 mg total) by mouth daily. (Patient taking differently: Take 80 mg by mouth at bedtime.) 90 tablet 1 08/20/2022   cetirizine (ZYRTEC) 10 MG tablet TAKE 1 TABLET BY MOUTH EVERY DAY (Patient taking differently: Take 10 mg by mouth at bedtime.) 30 tablet 2 08/20/2022   ezetimibe (ZETIA) 10 MG tablet Take 1 tablet (10 mg  total) by mouth daily. 90 tablet 3 08/21/2022   levothyroxine (SYNTHROID) 175 MCG tablet Take 1 tablet (175 mcg total) by mouth daily at 6 (six) AM. 90 tablet 2 08/21/2022   pantoprazole (PROTONIX) 40 MG tablet Take 1 tablet (40 mg total) by mouth daily. 90 tablet 1 08/21/2022   albuterol (VENTOLIN HFA) 108 (90 Base) MCG/ACT inhaler Inhale 1-2 puffs into the lungs every 6 (six) hours as needed for wheezing or shortness of breath. (Patient not taking: Reported on 08/21/2022) 18 each 3 Not Taking   fluticasone (FLONASE) 50 MCG/ACT nasal spray SPRAY 1 SPRAY INTO BOTH NOSTRILS DAILY. (Patient not taking: Reported on 08/21/2022) 16 mL 2 Not Taking   nicotine (NICODERM CQ - DOSED IN MG/24 HR) 7 mg/24hr patch Place 1 patch (7 mg total) onto the skin daily. (Patient not taking: Reported on 08/21/2022) 30 patch 1 Not Taking   nicotine polacrilex (NICORETTE) 2 MG gum Take 1 each (2 mg total) by mouth as needed for smoking cessation. (Patient not taking: Reported on 08/21/2022) 100 tablet 0 Not Taking    Assessment: 47 yo F with history of DVT, multiple CVAs, HLD, and obesity presented with acute bilateral vision loss and headache. Pt found to have subacute cortical schemia  involving the occipital lobe. Pt was on apixaban  BID prior to admission. Last dose of apixaban was on 4/16 prior to admission, and 4/17 at 09:21 inpatient.  Pharmacy consulted to transition patient from apixaban to warfarin with bridging   4/18 AM update: INR 1.1  Hgb 13.7 PLT 446K No s/sx of bleeding  Goal of Therapy:  INR 2.5-3.5 Monitor platelets by anticoagulation protocol: Yes   Plan:  Start lovenox 1 mg/kg *110 mg) [place] q12h. May DC when INR >2.5 DC aspirin Administer warfarin 10 mg PO x1 today Monitor daily CBC, INR, and monitor for s/sx of bleeding   Greta Doom BS, PharmD, BCPS Clinical Pharmacist 08/23/2022 10:25 AM  Contact: [phone] after 3 PM  "Be curious, not judgmental..." -Debbora Dus

## 2022-08-23 NOTE — Consult Note (Incomplete)
HEMATOLOGY/ONCOLOGY INPATIENT CONSULT NOTE  Date of Service: 08/22/2022  Inpatient Attending: .Ginnie Smart, MD  CC : Evaluation for hypercoagulable state  HPI  Hematology oncology was consulted for by Dr. Rogelia Mire MD for evaluation of possible hypercoagulable state in the context of recurrent DVTs and CVA's  Maria Porter is a 47 y.o. female with a history of previous DVTs and stroke. There is concern for a hypercoagulable state. Patient was on Aspirin prior to having a stroke.    -    OBJECTIVE:  ***  PHYSICAL EXAMINATION: . Vitals:   08/22/22 0704 08/22/22 0715 08/22/22 0914 08/22/22 0930  BP:  127/78    Pulse:  86  97  Resp:  (!) 23  (!) 21  Temp: 98.2 F (36.8 C)  98.3 F (36.8 C)   TempSrc: Oral  Oral   SpO2:  97%  100%   There were no vitals filed for this visit. .There is no height or weight on file to calculate BMI.  GENERAL:alert, in no acute distress and comfortable SKIN: skin color, texture, turgor are normal, no rashes or significant lesions EYES: normal, conjunctiva are pink and non-injected, sclera clear OROPHARYNX:no exudate, no erythema and lips, buccal mucosa, and tongue normal  NECK: supple, no JVD, thyroid normal size, non-tender, without nodularity LYMPH:  no palpable lymphadenopathy in the cervical, axillary or inguinal LUNGS: clear to auscultation with normal respiratory effort HEART: regular rate & rhythm,  no murmurs and no lower extremity edema ABDOMEN: abdomen soft, non-tender, normoactive bowel sounds  Musculoskeletal: no cyanosis of digits and no clubbing  PSYCH: alert & oriented x 3 with fluent speech NEURO: no focal motor/sensory deficits  MEDICAL HISTORY:  Past Medical History:  Diagnosis Date  . Acute deep vein thrombosis of right iliac vein 09/14/2020  . Stroke   Prediabetes Hypertension Dyslipidemia Tobacco abuse Obesity History of prior cocaine abuse Obstructive sleep apnea Previous  DVT Hypothyroidism'   History of strokes Remote bilateral frontal and left cerebellar infarct 01/2020 admitted for right-sided weakness, aphasia and right facial/arm numbness.  CT showed subacute left frontal infarct.  CTA head and neck and CT perfusion negative.  MRI showed left frontal cortical and subcortical infarcts.  MRA head and neck negative.  EEG negative.  EF 55 to 60%.  TCD insignificant HITS.  LE venous Doppler no DVT.  TEE showed very small PFO.  CSF analysis negative.  LDL 125, A1c 5.8.  Hypercoag workup only showed antiphospholipid IgM 14, slightly elevated.  Discussed with cardiology, no indication for PFO closure.  Patient discharged on DAPT and Lipitor 40. 09/2020 admitted in [place] for aphasia and right-sided weakness.  Status post tPA.  CTA head and neck left M1 occlusion.  Status post IR with TICI2b reperfusion.  2D echo and TEE unremarkable.  However, LE venous Doppler showed positive right external iliac vein thrombosis.  Put on Eliquis.  Patient had residual right-sided weakness and speech difficulty. 08/2021 patient was off Eliquis for 1 months and then developed left-sided numbness and slurred speech.  MRI showed large right cerebellar infarct.  CTA neck showed right SCA occlusion.  TTE unremarkable.  UDS positive for cocaine.  Loop recorder placed. 11/14/2021 follow-up [facility] with Ihor Austin, repeat LE vein Doppler negative for DVT.  Patient was off Eliquis for the last 2 weeks. 12/04/2021 follow-up with Dr. Rubie Maid cardiology, loop recorder no A-fib and reviewed TEE in the past showed no PFO. 12/2021 admitted for dysarthria.  MRI showed small acute cortical infarcts scattered  in the right MCA distribution.  CT head and neck unremarkable.  EF 60 to 65%.  Loop recorder no A-fib.  LDL 61, A1c 6.2.  Repeat hypercoag workup again unremarkable.  Patient prefer Eliquis as she had frequent travel.  Discharged on aspirin 81 and Eliquis 5 mg twice daily as well as Lipitor and Zetia.   Recommend smoking cessation. Follow-up in [facility] in 01/2022, doing better.  Patient stated that she is compliant with Eliquis.  SURGICAL HISTORY: Past Surgical History:  Procedure Laterality Date  . BUBBLE STUDY  01/29/2020   Procedure: BUBBLE STUDY;  Surgeon: Little Ishikawa, MD;  Location: [facility] ENDOSCOPY;  Service: Cardiovascular;;  . TEE WITHOUT CARDIOVERSION N/A 01/29/2020   Procedure: TRANSESOPHAGEAL ECHOCARDIOGRAM (TEE);  Surgeon: Little Ishikawa, MD;  Location: [facility] ENDOSCOPY;  Service: Cardiovascular;  Laterality: N/A;    SOCIAL HISTORY: Social History   Socioeconomic History  . Marital status: Single    Spouse name: Not on file  . Number of children: Not on file  . Years of education: Not on file  . Highest education level: Not on file  Occupational History  . Not on file  Tobacco Use  . Smoking status: Every Day    Packs/day: .5    Types: Cigarettes    Start date: 03/07/2020    Last attempt to quit: 08/30/2021    Years since quitting: 0.9  . Smokeless tobacco: Never  . Tobacco comments:    1 pk per day.   Stopped x 1 month age    12/04/2021 patient smokes 4 cigarettes daily  Substance and Sexual Activity  . Alcohol use: Yes  . Drug use: Not on file  . Sexual activity: Not on file  Other Topics Concern  . Not on file  Social History Narrative   Lives with sister   Left Handed   Drinks 2-3 cups caffeine daily   Social Determinants of Health   Financial Resource Strain: High Risk (11/15/2021)   Overall Financial Resource Strain (CARDIA)   . Difficulty of Paying Living Expenses: Very hard  Food Insecurity: No Food Insecurity (08/21/2022)   Hunger Vital Sign   . Worried About Programme researcher, broadcasting/film/video in the Last Year: Never true   . Ran Out of Food in the Last Year: Never true  Transportation Needs: No Transportation Needs (08/21/2022)   PRAPARE - Transportation   . Lack of Transportation (Medical): No   . Lack of Transportation (Non-Medical): No  Physical  Activity: Not on file  Stress: Not on file  Social Connections: Not on file  Intimate Partner Violence: Not At Risk (08/21/2022)   Humiliation, Afraid, Rape, and Kick questionnaire   . Fear of Current or Ex-Partner: No   . Emotionally Abused: No   . Physically Abused: No   . Sexually Abused: No    FAMILY HISTORY: Family History  Problem Relation Age of Onset  . Sleep apnea Neg Hx     ALLERGIES:  has No Known Allergies.  MEDICATIONS:  Scheduled Meds: . apixaban  5 mg Oral BID  . aspirin EC  81 mg Oral Daily  . atorvastatin  80 mg Oral Daily  . ezetimibe  10 mg Oral Daily  . levothyroxine  175 mcg Oral Q0600  . pantoprazole  40 mg Oral Daily   Continuous Infusions: PRN Meds:.albuterol, polyethylene glycol  REVIEW OF SYSTEMS:    10 Point review of Systems was done is negative except as noted above.   LABORATORY DATA:  I have  reviewed the data as listed  .    Latest Ref Rng & Units 08/22/2022    5:20 AM 08/21/2022    5:55 PM 08/21/2022    5:51 PM  CBC  WBC 4.0 - 10.5 K/uL 8.6   7.7   Hemoglobin 12.0 - 15.0 g/dL 16.1  09.6  04.5   Hematocrit 36.0 - 46.0 % 39.4  45.0  43.1   Platelets 150 - 400 K/uL 410   399     .    Latest Ref Rng & Units 08/22/2022    5:20 AM 08/21/2022    5:55 PM 08/21/2022    5:51 PM  CMP  Glucose 70 - 99 mg/dL 409  811  914   BUN 6 - 20 mg/dL 5  8  8    Creatinine 0.44 - 1.00 mg/dL 7.82  9.56  2.13   Sodium 135 - 145 mmol/L 136  139  138   Potassium 3.5 - 5.1 mmol/L 4.0  3.9  3.8   Chloride 98 - 111 mmol/L 103  104  102   CO2 22 - 32 mmol/L 24   24   Calcium 8.9 - 10.3 mg/dL 9.3   9.6   Total Protein 6.5 - 8.1 g/dL   8.4   Total Bilirubin 0.3 - 1.2 mg/dL   0.4   Alkaline Phos 38 - 126 U/L   140   AST 15 - 41 U/L   27   ALT 0 - 44 U/L   35      RADIOGRAPHIC STUDIES: I have personally reviewed the radiological images as listed and agreed with the findings in the report. MR BRAIN WO CONTRAST  Result Date: 08/21/2022 CLINICAL DATA:   Stroke follow-up EXAM: MRI HEAD WITHOUT CONTRAST TECHNIQUE: Multiplanar, multiecho pulse sequences of the brain and surrounding structures were obtained without intravenous contrast. COMPARISON:  None Available. FINDINGS: Brain: Cortical ischemia throughout the left occipital lobe. Multifocal chronic microhemorrhage in subarachnoid siderosis. No acute hemorrhage. Old infarcts of the right cerebellum and left frontal lobe. There is multifocal hyperintense T2-weighted signal within the white matter. Generalized volume loss. The midline structures are normal. Vascular: Major flow voids are preserved. Skull and upper cervical spine: Normal calvarium and skull base. Visualized upper cervical spine and soft tissues are normal. Sinuses/Orbits:No paranasal sinus fluid levels or advanced mucosal thickening. No mastoid or middle ear effusion. Normal orbits. IMPRESSION: 1. Acute/early subacute cortical ischemia throughout the left occipital lobe. No acute hemorrhage or mass effect. 2. Multifocal chronic microhemorrhage and subarachnoid siderosis. 3. Old infarcts of the right cerebellum and left frontal lobe. Electronically Signed   By: Deatra Robinson M.D.   On: 08/21/2022 22:34   DG CHEST PORT 1 VIEW  Result Date: 08/21/2022 CLINICAL DATA:  Loss of vision, acute CVA EXAM: PORTABLE CHEST 1 VIEW COMPARISON:  None Available. FINDINGS: Hazy opacity over the left lower lung is favored projectional due to soft tissue overlap. No focal consolidation, pleural effusion, or pneumothorax. Normal cardiomediastinal silhouette. IMPRESSION: No active disease. Electronically Signed   By: Minerva Fester M.D.   On: 08/21/2022 21:04   CT ANGIO HEAD NECK W WO CM  Result Date: 08/21/2022 CLINICAL DATA:  Loss of vision EXAM: CT ANGIOGRAPHY HEAD AND NECK WITH AND WITHOUT CONTRAST TECHNIQUE: Multidetector CT imaging of the head and neck was performed using the standard protocol during bolus administration of intravenous contrast. Multiplanar  CT image reconstructions and MIPs were obtained to evaluate the vascular anatomy. Carotid stenosis measurements (when applicable)  are obtained utilizing NASCET criteria, using the distal internal carotid diameter as the denominator. RADIATION DOSE REDUCTION: This exam was performed according to the departmental dose-optimization program which includes automated exposure control, adjustment of the mA and/or kV according to patient size and/or use of iterative reconstruction technique. CONTRAST:  75 mL Omnipaque 350 COMPARISON:  12/10/2021 CTA head neck FINDINGS: CT HEAD FINDINGS For noncontrast findings, please see same day CT head. CTA NECK FINDINGS Aortic arch: Two-vessel arch with a common origin of the brachiocephalic and left common carotid arteries. Imaged portion shows no evidence of aneurysm or dissection. No significant stenosis of the major arch vessel origins. Right carotid system: No evidence of dissection, occlusion, or hemodynamically significant stenosis (greater than 50%). Left carotid system: No evidence of dissection, occlusion, or hemodynamically significant stenosis (greater than 50%). Vertebral arteries: Right dominant system. No evidence of dissection, occlusion, or hemodynamically significant stenosis (greater than 50%). Skeleton: No acute osseous abnormality. Degenerative changes in the cervical spine. Other neck: Negative. Upper chest: No focal pulmonary opacity or pleural effusion. Review of the MIP images confirms the above findings CTA HEAD FINDINGS Anterior circulation: Both internal carotid arteries are patent to the termini, without significant stenosis. A1 segments patent. Normal anterior communicating artery. Anterior cerebral arteries are patent to their distal aspects without significant stenosis. No M1 stenosis or occlusion. MCA branches perfused to their distal aspects without significant stenosis. Posterior circulation: Vertebral arteries patent to the vertebrobasilar junction  without significant stenosis. Posterior inferior cerebellar arteries patent proximally. Basilar patent to its distal aspect without significant stenosis. Superior cerebellar arteries patent proximally. Patent right P1. Fetal origin of the left PCA from the left posterior communicating artery. PCAs perfused to their distal aspects without significant stenosis. The bilateral posterior communicating arteries are not visualized. Venous sinuses: As permitted by contrast timing, patent. Anatomic variants: Fetal origin of the left PCA. Review of the MIP images confirms the above findings IMPRESSION: 1. No intracranial large vessel occlusion or significant stenosis. 2. No hemodynamically significant stenosis in the neck. Imaging results were communicated on 08/21/2022 at 6:13 pm to provider Dr. Otelia Limes via secure text paging. Electronically Signed   By: Wiliam Ke M.D.   On: 08/21/2022 18:13   CT HEAD CODE STROKE WO CONTRAST  Result Date: 08/21/2022 CLINICAL DATA:  Code stroke.  Vision loss EXAM: CT HEAD WITHOUT CONTRAST TECHNIQUE: Contiguous axial images were obtained from the base of the skull through the vertex without intravenous contrast. RADIATION DOSE REDUCTION: This exam was performed according to the departmental dose-optimization program which includes automated exposure control, adjustment of the mA and/or kV according to patient size and/or use of iterative reconstruction technique. COMPARISON:  12/09/2021 CTA head and neck FINDINGS: Brain: New hypodensity and loss of gray-white differentiation in the left occipital and parietal lobe (series 3, images 20-23 and series 6, image 36-42). Redemonstrated hypodensity in the left anterior frontal lobe and right cerebellum. No evidence of acute hemorrhage, mass, mass effect, or midline shift. No hydrocephalus or extra-axial collection. Vascular: No hyperdense vessel. Skull: Negative for fracture or focal lesion. Sinuses/Orbits: No acute finding. Other: The mastoid  air cells are well aerated. ASPECTS (Alberta Stroke Program Early CT Score) - Ganglionic level infarction (caudate, lentiform nuclei, internal capsule, insula, M1-M3 cortex): 7 - Supraganglionic infarction (M4-M6 cortex): 3 Total score (0-10 with 10 being normal): 10 IMPRESSION: New hypodensity and loss of gray-white differentiation in the left occipital and parietal lobes, concerning for acute infarct. No acute intracranial hemorrhage. Imaging results were communicated  on 08/21/2022 at 5:55 pm to provider Dr. Otelia Limes via secure text paging. Electronically Signed   By: Wiliam Ke M.D.   On: 08/21/2022 17:57    ASSESSMENT & PLAN:   47 y.o. female with:  Acute CVA Hypercoagulable state History of right iliac veinous thrombus 2022 Tobacco use disorder Hyperlipidemia Obesity  Hypothyroidism  Prediabetes  Subclinical hypothyroidism History of DVT   PLAN:  -Discussed lab results on 08/22/2022 with patient in detail. CBC showed WBC of 8.6K, hemoglobin of 13.3, and platelets of 410K. -   The total time spent in the appointment was *** minutes* .  All of the patient's questions were answered with apparent satisfaction. The patient knows to call the clinic with any problems, questions or concerns.   Wyvonnia Lora MD MS AAHIVMS [facility] Hematology/Oncology Physician [facility]  .*Total Encounter Time as defined by the Centers for Medicare and Medicaid Services includes, in addition to the face-to-face time of a patient visit (documented in the note above) non-face-to-face time: obtaining and reviewing outside history, ordering and reviewing medications, tests or procedures, care coordination (communications with other health care professionals or caregivers) and documentation in the medical record.    I,Mitra Faeizi,acting as a Neurosurgeon for No name on file.,have documented all relevant documentation on the behalf of No name on file,as directed by  No name on file while in the  presence of No name on file.

## 2022-08-23 NOTE — Discharge Instructions (Addendum)
Information on my medicine - Coumadin   (Warfarin)  This medication education was reviewed with me or my healthcare representative as part of my discharge preparation.    Why was Coumadin prescribed for you? Coumadin was prescribed for you because you have a blood clot or a medical condition that can cause an increased risk of forming blood clots. Blood clots can cause serious health problems by blocking the flow of blood to the heart, lung, or brain. Coumadin can prevent harmful blood clots from forming. As a reminder your indication for Coumadin is:  Blood Clotting Disorder  What test will check on my response to Coumadin? While on Coumadin (warfarin) you will need to have an INR test regularly to ensure that your dose is keeping you in the desired range. The INR (international normalized ratio) number is calculated from the result of the laboratory test called prothrombin time (PT).  If an INR APPOINTMENT HAS NOT ALREADY BEEN MADE FOR YOU please schedule an appointment to have this lab work done by your health care provider within 7 days. Your INR goal will be a number between:  2.5 to 3.5  What  do you need to  know  About  COUMADIN? Take Coumadin (warfarin) exactly as prescribed by your healthcare provider about the same time each day.  DO NOT stop taking without talking to the doctor who prescribed the medication.  Stopping without other blood clot prevention medication to take the place of Coumadin may increase your risk of developing a new clot or stroke.  Get refills before you run out.  What do you do if you miss a dose? If you miss a dose, take it as soon as you remember on the same day then continue your regularly scheduled regimen the next day.  Do not take two doses of Coumadin at the same time.  Important Safety Information A possible side effect of Coumadin (Warfarin) is an increased risk of bleeding. You should call your healthcare provider right away if you experience any of  the following: Bleeding from an injury or your nose that does not stop. Unusual colored urine (red or dark brown) or unusual colored stools (red or black). Unusual bruising for unknown reasons. A serious fall or if you hit your head (even if there is no bleeding).  Some foods or medicines interact with Coumadin (warfarin) and might alter your response to warfarin. To help avoid this: Eat a balanced diet, maintaining a consistent amount of Vitamin K. Notify your provider about major diet changes you plan to make. Avoid alcohol or limit your intake to 1 drink for women and 2 drinks for men per day. (1 drink is 5 oz. wine, 12 oz. beer, or 1.5 oz. liquor.)  Make sure that ANY health care provider who prescribes medication for you knows that you are taking Coumadin (warfarin).  Also make sure the healthcare provider who is monitoring your Coumadin knows when you have started a new medication including herbals and non-prescription products.  Coumadin (Warfarin)  Major Drug Interactions  Increased Warfarin Effect Decreased Warfarin Effect  Alcohol (large quantities) Antibiotics (esp. Septra/Bactrim, Flagyl, Cipro) Amiodarone (Cordarone) Aspirin (ASA) Cimetidine (Tagamet) Megestrol (Megace) NSAIDs (ibuprofen, naproxen, etc.) Piroxicam (Feldene) Propafenone (Rythmol SR) Propranolol (Inderal) Isoniazid (INH) Posaconazole (Noxafil) Barbiturates (Phenobarbital) Carbamazepine (Tegretol) Chlordiazepoxide (Librium) Cholestyramine (Questran) Griseofulvin Oral Contraceptives Rifampin Sucralfate (Carafate) Vitamin K   Coumadin (Warfarin) Major Herbal Interactions  Increased Warfarin Effect Decreased Warfarin Effect  Garlic Ginseng Ginkgo biloba Coenzyme Q10 Green tea St.  John's wort    Coumadin (Warfarin) FOOD Interactions  Eat a consistent number of servings per week of foods HIGH in Vitamin K (1 serving =  cup)  Collards (cooked, or boiled & drained) Kale (cooked, or boiled &  drained) Mustard greens (cooked, or boiled & drained) Parsley *serving size only =  cup Spinach (cooked, or boiled & drained) Swiss chard (cooked, or boiled & drained) Turnip greens (cooked, or boiled & drained)  Eat a consistent number of servings per week of foods MEDIUM-HIGH in Vitamin K (1 serving = 1 cup)  Asparagus (cooked, or boiled & drained) Broccoli (cooked, boiled & drained, or raw & chopped) Brussel sprouts (cooked, or boiled & drained) *serving size only =  cup Lettuce, raw (green leaf, endive, romaine) Spinach, raw Turnip greens, raw & chopped   These websites have more information on Coumadin (warfarin):  http://www.king-russell.com/; https://www.hines.net/;  -----------------------------------------------------------------------------------------------------------------------------------------------------  Information on my medicine - LOVENOX (enoxaparin)  This medication education was reviewed with me or my healthcare representative as part of my discharge preparation.    Why was lovenox prescribed for you? Lovenox was prescribed to treat blood clots that may have been found in the veins of your legs (deep vein thrombosis) or in your lungs (pulmonary embolism) and to reduce the risk of them occurring again.  What do You need to know about Lovenox? Lovenox  is given by a shot under the skin by you or a care provider. Lovenox is injected into the right or left side of your abdomen at least 2 inches away from your belly button. Clean the injection site with an alcohol swab and let dry. Give only the amount prescribed to you by your healthcare provider and do not change the dose unless instructed by your health care provider.  Try to administer the dose(s) about the same time every day. To minimize bruising and irritation, rotate where the shots are given.  Store Lovenox at room temperature, away from heat and direct light. Do not refrigerate or freeze the  syringes. Keep away from children or pets.  Take Lovenox exactly as prescribed and DO NOT stop taking Lovenox without talking to the doctor who prescribed the medication.  Stopping may increase your risk of developing a new blood clot.    After discharge, you should have regular appointments with your healthcare provider.  Dispose of used syringe and cap in a sharps disposal container once used (if sharps disposal container unavailable, use any hard plastic jug that can be securely closed).     What do you do if you miss a dose? If a dose of Lovenox is not administered at the scheduled time, administer it as soon as possible on the same day and then resume regular administration schedule. The dose should not be doubled to make up for a missed dose.  Important Safety Information A possible side effect of Lovenox is bleeding. You should call your healthcare provider right away if you experience any of the following: Bleeding from an injury or your nose that does not stop. Unusual colored urine (red or dark brown) or unusual colored stools (red or black). Unusual bruising for unknown reasons. A serious fall or if you hit your head (even if there is no bleeding).  Some medicines may interact with Lovenox and might increase your risk of bleeding or clotting while on Lovenox. To help avoid this, consult your healthcare provider or pharmacist prior to using any new prescription or non-prescription medications, including herbals, vitamins, non-steroidal anti-inflammatory  drugs (NSAIDs) and supplements.  This website has more information on Lovenox (enoxaparin): www.http://rich.org/.

## 2022-08-23 NOTE — TOC Transition Note (Addendum)
Transition of Care [facility]) - CM/SW Discharge Note   Patient Details  Name: Maria Porter MRN: [id number] Date of Birth: May 30, 1975  Transition of Care [facility]) CM/SW Contact:  Kermit Balo, RN Phone Number: 08/23/2022, 1:46 PM   Clinical Narrative:    Pt is discharging home with [facility] services ordered. Pt will go to her home for a couple days and then she will be moving to her daughters in [place], [place] over the weekend. [address] [phone] CM has found HH in [place] to accept her and information is on the AVS. [place]: fax [phone] Pt asking for coumadin clinic in [place]. Cm has updated the MDs.  Pt has transportation home.   Final next level of care: Home w Home Health Services Barriers to Discharge: No Barriers Identified   Patient Goals and CMS Choice CMS Medicare.gov Compare Post Acute Care list provided to:: Patient Choice offered to / list presented to : Patient  Discharge Placement                         Discharge Plan and Services Additional resources added to the After Visit Summary for                            [facility] Arranged: PT, OT, Speech Therapy HH Agency: Other - See comment Shellia Carwin [facility]) Date HH Agency Contacted: 08/23/22   Representative spoke with at [facility] Agency: Mia  Social Determinants of Health (SDOH) Interventions SDOH Screenings   Food Insecurity: No Food Insecurity (08/21/2022)  Housing: Low Risk  (08/21/2022)  Transportation Needs: No Transportation Needs (08/21/2022)  Utilities: Not At Risk (08/21/2022)  Depression (PHQ2-9): Low Risk  (08/09/2022)  Financial Resource Strain: High Risk (11/15/2021)  Tobacco Use: High Risk (08/22/2022)     Readmission Risk Interventions     No data to display

## 2022-08-24 ENCOUNTER — Other Ambulatory Visit (HOSPITAL_COMMUNITY): Payer: Self-pay

## 2022-08-24 DIAGNOSIS — I639 Cerebral infarction, unspecified: Secondary | ICD-10-CM | POA: Diagnosis not present

## 2022-08-24 LAB — PROTIME-INR
INR: 1.5 — ABNORMAL HIGH (ref 0.8–1.2)
Prothrombin Time: 18 seconds — ABNORMAL HIGH (ref 11.4–15.2)

## 2022-08-24 LAB — CBC
HCT: 39.2 % (ref 36.0–46.0)
Hemoglobin: 13.3 g/dL (ref 12.0–15.0)
MCH: 28.4 pg (ref 26.0–34.0)
MCHC: 33.9 g/dL (ref 30.0–36.0)
MCV: 83.6 fL (ref 80.0–100.0)
Platelets: 404 10*3/uL — ABNORMAL HIGH (ref 150–400)
RBC: 4.69 MIL/uL (ref 3.87–5.11)
RDW: 12.6 % (ref 11.5–15.5)
WBC: 7.1 10*3/uL (ref 4.0–10.5)
nRBC: 0 % (ref 0.0–0.2)

## 2022-08-24 LAB — GLUCOSE, CAPILLARY: Glucose-Capillary: 139 mg/dL — ABNORMAL HIGH (ref 70–99)

## 2022-08-24 MED ORDER — ORAL CARE MOUTH RINSE: 15.0000 mL | OROMUCOSAL | Status: DC | PRN

## 2022-08-24 MED ORDER — ENOXAPARIN SODIUM 120 MG/0.8ML IJ SOSY
110.0000 mg | PREFILLED_SYRINGE | Freq: Two times a day (BID) | INTRAMUSCULAR | 0 refills | Status: AC
  Filled 2022-08-24: qty 12.8, 8d supply, fill #0

## 2022-08-24 MED ORDER — WARFARIN SODIUM 5 MG PO TABS
5.0000 mg | ORAL_TABLET | Freq: Every day | ORAL | 0 refills | Status: AC
  Filled 2022-08-24: qty 30, 30d supply, fill #0

## 2022-08-24 MED ORDER — WARFARIN SODIUM 5 MG PO TABS: 5.0000 mg | ORAL_TABLET | Freq: Once | ORAL | Status: DC

## 2022-08-24 NOTE — Plan of Care (Signed)

## 2022-08-24 NOTE — Progress Notes (Signed)
Occupational Therapy Treatment Patient Details Name: Maria Porter MRN: [id number] DOB: [birth year] Today's Date: 08/24/2022   History of present illness 47 y.o. female presents to [facility] hospital on 08/21/2022 with acute loss of vision on R side. CT shows evidence of acute infarcts in L occipital and parietal lobes. PMH includes recurrent cryptogenic strokes with R residual weakness, foreign accent syndrome, DVT, hypothyroidism, HLD.   OT comments  Patient eager for dc home with plans to move to [place] with daughter soon.  She demonstrates good recall of compensatory techniques and safety for R visual deficit. Able to locate all items on R side in hallway, navigate environment independently. No questions or concerns for dc.   Goals met, acute OT to sign off.    Recommendations for follow up therapy are one component of a multi-disciplinary discharge planning process, led by the attending physician.  Recommendations may be updated based on patient status, additional functional criteria and insurance authorization.    Assistance Recommended at Discharge PRN  Patient can return home with the following  Assist for transportation   Equipment Recommendations  None recommended by OT    Recommendations for Other Services      Precautions / Restrictions Precautions Precautions: Fall Precaution Comments: chronic R weakness, R visual field cut Restrictions Weight Bearing Restrictions: No       Mobility Bed Mobility               General bed mobility comments: OOB upon entry    Transfers Overall transfer level: Modified independent Equipment used: Quad cane                     Balance Overall balance assessment: Needs assistance Sitting-balance support: No upper extremity supported, Feet supported Sitting balance-Leahy Scale: Good     Standing balance support: During functional activity, Single extremity supported, No upper extremity supported Standing balance-Leahy Scale:  Good                             ADL either performed or assessed with clinical judgement   ADL Overall ADL's : Modified independent                                            Extremity/Trunk Assessment              Vision       Perception     Praxis      Cognition Arousal/Alertness: Awake/alert Behavior During Therapy: WFL for tasks assessed/performed Overall Cognitive Status: Within Functional Limits for tasks assessed                                 General Comments: assist to recall final step in trail making task.        Exercises      Shoulder Instructions       General Comments pt utilized compensatory strategies (visual scaning) without cueing to locating items on R side without assist    Pertinent Vitals/ Pain       Pain Assessment Pain Assessment: No/denies pain  Home Living  Prior Functioning/Environment              Frequency  Min 2X/week        Progress Toward Goals  OT Goals(current goals can now be found in the care plan section)  Progress towards OT goals: Goals met/education completed, patient discharged from OT  Acute Rehab OT Goals Patient Stated Goal: get my vision better OT Goal Formulation: With patient  Plan All goals met and education completed, patient discharged from OT services    Co-evaluation                 AM-PAC OT "6 Clicks" Daily Activity     Outcome Measure   Help from another person eating meals?: None Help from another person taking care of personal grooming?: None Help from another person toileting, which includes using toliet, bedpan, or urinal?: None Help from another person bathing (including washing, rinsing, drying)?: None Help from another person to put on and taking off regular upper body clothing?: None Help from another person to put on and taking off regular lower body clothing?:  None 6 Click Score: 24    End of Session    OT Visit Diagnosis: Low vision, both eyes (H54.2);Other abnormalities of gait and mobility (R26.89);Hemiplegia and hemiparesis Hemiplegia - Right/Left: Right Hemiplegia - dominant/non-dominant: Non-Dominant Hemiplegia - caused by: Cerebral infarction (chronic)   Activity Tolerance Patient tolerated treatment well   Patient Left with call bell/phone within reach;Other (comment) (sitting EOB)   Nurse Communication Mobility status        Time: 1610-9604 OT Time Calculation (min): 12 min  Charges: OT General Charges $OT Visit: 1 Visit OT Treatments $Self Care/Home Management : 8-22 mins  Barry Brunner, OT Acute Rehabilitation Services Office [phone]   Chancy Milroy 08/24/2022, 12:44 PM

## 2022-08-24 NOTE — Progress Notes (Signed)
ANTICOAGULATION CONSULT NOTE - follow-up  Pharmacy Consult for Warfarin w/ lovenox bridging therapy Indication:  multiple CVAs , h/o DVT  No Known Allergies  Patient Measurements: Height:  (167.6 cm) Weight: 112.1 kg (247 lb 2.2 oz) IBW/kg (Calculated) : 59.3  Vital Signs: Temp: 97.5 F (36.4 C) (04/19 0437) Temp Source: Oral (04/19 0437) BP: 146/91 (04/19 0437) Pulse Rate: 84 (04/19 0437)  Labs: Recent Labs    08/21/22 1751 08/21/22 1755 08/22/22 0520 08/23/22 0726 08/24/22 0435  HGB 14.1 15.3* 13.3 13.7 13.3  HCT 43.1 45.0 39.4 42.5 39.2  PLT 399  --  410* 446* 404*  APTT 29  --   --   --   --   LABPROT 14.0  --   --  13.9 18.0*  INR 1.1  --   --  1.1 1.5*  CREATININE 0.93 0.70 0.81 0.77  --      Estimated Creatinine Clearance: 109.2 mL/min (by C-G formula based on SCr of 0.77 mg/dL).   Medical History: Past Medical History:  Diagnosis Date   Acute deep vein thrombosis of right iliac vein 09/14/2020   Stroke     Medications:  Medications Prior to Admission  Medication Sig Dispense Refill Last Dose   acetaminophen (TYLENOL) 500 MG tablet Take 1,000 mg by mouth daily as needed for moderate pain, fever or headache.   Past Week   albuterol (PROVENTIL) (2.5 MG/3ML) 0.083% nebulizer solution Take 3 mLs (2.5 mg total) by nebulization every 4 (four) hours as needed for wheezing or shortness of breath. 75 mL 2 Past Week   apixaban (ELIQUIS) 5 MG TABS tablet Take 1 tablet (5 mg total) by mouth 2 (two) times daily. 180 tablet 1 08/21/2022 at 1000   aspirin EC 81 MG tablet Take 1 tablet (81 mg total) by mouth daily. Swallow whole. 150 tablet 2 08/21/2022   atorvastatin (LIPITOR) 80 MG tablet Take 1 tablet (80 mg total) by mouth daily. (Patient taking differently: Take 80 mg by mouth at bedtime.) 90 tablet 1 08/20/2022   cetirizine (ZYRTEC) 10 MG tablet TAKE 1 TABLET BY MOUTH EVERY DAY (Patient taking differently: Take 10 mg by mouth at bedtime.) 30 tablet 2 08/20/2022    ezetimibe (ZETIA) 10 MG tablet Take 1 tablet (10 mg total) by mouth daily. 90 tablet 3 08/21/2022   levothyroxine (SYNTHROID) 175 MCG tablet Take 1 tablet (175 mcg total) by mouth daily at 6 (six) AM. 90 tablet 2 08/21/2022   pantoprazole (PROTONIX) 40 MG tablet Take 1 tablet (40 mg total) by mouth daily. 90 tablet 1 08/21/2022   albuterol (VENTOLIN HFA) 108 (90 Base) MCG/ACT inhaler Inhale 1-2 puffs into the lungs every 6 (six) hours as needed for wheezing or shortness of breath. (Patient not taking: Reported on 08/21/2022) 18 each 3 Not Taking   fluticasone (FLONASE) 50 MCG/ACT nasal spray SPRAY 1 SPRAY INTO BOTH NOSTRILS DAILY. (Patient not taking: Reported on 08/21/2022) 16 mL 2 Not Taking   nicotine (NICODERM CQ - DOSED IN MG/24 HR) 7 mg/24hr patch Place 1 patch (7 mg total) onto the skin daily. (Patient not taking: Reported on 08/21/2022) 30 patch 1 Not Taking   nicotine polacrilex (NICORETTE) 2 MG gum Take 1 each (2 mg total) by mouth as needed for smoking cessation. (Patient not taking: Reported on 08/21/2022) 100 tablet 0 Not Taking    Assessment: 47 yo F with history of DVT, multiple CVAs, HLD, and obesity presented with acute bilateral vision loss and headache. Pt found  to have subacute cortical schemia involving the occipital lobe. Pt was on apixaban  BID prior to admission. Last dose of apixaban was on 4/16 prior to admission, and 4/17 at 09:21 inpatient.  Pharmacy consulted to transition patient from apixaban to warfarin with bridging   4/19 AM update: INR 1.5  Hgb 13.3 PLT 404K No s/sx of bleeding  Goal of Therapy:  INR 2.5-3.5 Monitor platelets by anticoagulation protocol: Yes   Plan:  Continue lovenox 1 mg/kg *110 mg) [place] q12h. May DC when INR >2.5 Administer warfarin 5 mg PO x1 today Monitor daily CBC, INR, and monitor for s/sx of bleeding   Greta Doom BS, PharmD, BCPS Clinical Pharmacist 08/24/2022 6:41 AM  Contact: [phone] after 3 PM  "Be curious, not  judgmental..." -Debbora Dus

## 2022-08-24 NOTE — Progress Notes (Signed)
Pt has been discharged home. IV's has been removed, catheters are intact. Tele has been removed. All belongings are with pt. Pt has received discharge instructions and paperwork. Pt has left floor with SWOT RN via wheelchair. Pt to pick up new prescription at [facility] pharmacy.. Pt in discharge lounge awaiting ride home.

## 2022-08-24 NOTE — Discharge Summary (Signed)
Name: Maria Porter MRN: [id number] DOB: 11/14/1975 47 y.o. PCP: Belva Agee, MD  Date of Admission: 08/21/2022  5:20 PM Date of Discharge: 08-24-2022 Attending Physician: Reymundo Poll, MD  Discharge Diagnosis: Principal Problem:   Acute CVA (cerebrovascular accident) Active Problems:   Tobacco use disorder   Hyperlipemia   Subclinical hypothyroidism   History of DVT (deep vein thrombosis)   Visual field defect   Primary hypercoagulable state     Discharge Medications: Allergies as of 08/24/2022   No Known Allergies      Medication List     STOP taking these medications    apixaban 5 MG Tabs tablet Commonly known as: Eliquis   aspirin EC 81 MG tablet       TAKE these medications    acetaminophen 500 MG tablet Commonly known as: TYLENOL Take 1,000 mg by mouth daily as needed for moderate pain, fever or headache.   albuterol (2.5 MG/3ML) 0.083% nebulizer solution Commonly known as: PROVENTIL Take 3 mLs (2.5 mg total) by nebulization every 4 (four) hours as needed for wheezing or shortness of breath.   albuterol 108 (90 Base) MCG/ACT inhaler Commonly known as: Ventolin HFA Inhale 1-2 puffs into the lungs every 6 (six) hours as needed for wheezing or shortness of breath.   atorvastatin 80 MG tablet Commonly known as: LIPITOR Take 1 tablet (80 mg total) by mouth daily. What changed: when to take this   cetirizine 10 MG tablet Commonly known as: ZYRTEC TAKE 1 TABLET BY MOUTH EVERY DAY What changed: when to take this   enoxaparin 120 MG/0.8ML injection Commonly known as: LOVENOX Inject 0.74 mLs (110 mg total) into the skin every 12 (twelve) hours for 8 days.   ezetimibe 10 MG tablet Commonly known as: ZETIA Take 1 tablet (10 mg total) by mouth daily.   fluticasone 50 MCG/ACT nasal spray Commonly known as: FLONASE SPRAY 1 SPRAY INTO BOTH NOSTRILS DAILY.   levothyroxine 175 MCG tablet Commonly known as: SYNTHROID Take 1 tablet (175  mcg total) by mouth daily at 6 (six) AM.   nicotine 7 mg/24hr patch Commonly known as: NICODERM CQ - dosed in mg/24 hr Place 1 patch (7 mg total) onto the skin daily.   nicotine polacrilex 2 MG gum Commonly known as: NICORETTE Take 1 each (2 mg total) by mouth as needed for smoking cessation.   pantoprazole 40 MG tablet Commonly known as: PROTONIX Take 1 tablet (40 mg total) by mouth daily.   warfarin 5 MG tablet Commonly known as: COUMADIN Take 1 tablet (5 mg total) by mouth daily at 4 PM.         Disposition and follow-up:    Maria.Maria Porter was discharged from [facility] in Stable condition.  At the hospital follow up visit please address:   1.  Subacute cortical stroke: assess visual fields and baseline weakness, measure blood pressure and consider starting antihypertensive agent, counsel on smoking cessation and provide assistance as indicated to minimize modifiable risk factors  2.  Hypercoagulable state: confirm adherence to warfarin and enoxaparin bridge, establish with pharmacist for monitoring and dose adjustments, follow pending laboratory tests and consider referring to hematology if appropriate  3.  Hyperlipidemia: confirm adherence to atorvastatin and ezetimibe, recheck lipid panel in 02-2023  4. Labs / imaging needed at time of follow-up: PT-INR  5.  Pending labs / tests needing follow-up: Hgb Fraction, MM Panel, PAI polymorphism    Follow-up Appointments:  Follow-up Information  [facility] Follow up.   Why: The home health agency will contact you for the first home visit. Contact information: [phone]        Ihor Austin, NP. Schedule an appointment as soon as possible for a visit in 1 month(s).   Specialty: Neurology Why: stroke clinic Contact information: [address] [place] [address] [phone]                Internal Medicine Pharmacy on 4-22 Internal Medicine Clinic on  4-24 Neurology Stroke Clinic, call to schedule      Hospital Course by problem list:  Maria Porter is a 47 year old female with a past medical history of obesity, hyperlipidemia, DVT, and multiple CVAs who presented with acute bilateral vision loss in peripheral right fields and an associated headache, admitted for management of stroke and further etiologic hypercoagulable workup.       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 Extensive hypercoagulability workup in the past largely unremarkable, notable only for slightly elevated antiphospholipid IgM level and possible PFO. Only significant risk factor is smoking. Current medications include aspirin, apixaban, atorvastatin, and ezetimibe. Etiology remains unclear, possibly embolic strokes of undetermined source. Presented on 4-16 with right-sided vision loss and headache. Upon arrival, brain MRI revealed subacute cortical ischemia involving occipital lobe. Echocardiogram negative for PFO, lower extremity ultrasound without DVT. Exam notable for right homonymous hemianopsia, unchanged throughout hospitalization. Home medication regimen of aspirin, apixaban, and atorvastatin continued at admission. Given recent stroke despite anticoagulation, apixaban transitioned to warfarin with enoxaparin bridge. Discharged with enoxaparin and warfarin, plus instructions to follow-up in several days for dose adjustment.    ---Hypercoagulable state  ---History of right iliac veinous thrombus 2022 Patient has history of multiple strokes since 2021 and deep vein thrombosis in 2022. Prior hypercoagulability workup has been largely unremarkable, notable only for a slightly elevated antiphospholipid IgM level that was negative on subsequent testing.  Adherence to aspirin, apixaban, and atorvastatin has been good per patient. Repeat laboratory testing was performed to identify underlying cause, which remains unclear possibly thrombophilia. Notable findings include slightly elevated ESR and CRP, negative ANA. Given recent stroke despite anticoagulation therapy, hematology was consulted. Discharged with warfarin plus an enoxaparin bridge. Pending laboratory results include MM panel, Hgb fractionation, and PAI polymorphisms.      ---Hyperlipidemia  ---Obesity Patient has history of obesity and hyperlipidemia. Home medications include atorvastatin and ezetimibe, which she reports taking daily. Upon arrival, lipid panel notable for TChol 114 and LDL 63. Atorvastatin and ezetimibe were continued throughout hospitalization and upon discharge.    ---Hypothyroidism Patient has history of hypothyroidism managed with levothyroxine, which she takes daily. Previous TSH collected 12-2021 was 17. Upon arrival, TSH was 21. Subsequent free T4 level was within normal limits, suggesting subclinical hypothyroidism. Current dose of home levothyroxine was continued on admission. Recommend further monitoring and titration as needed in the outpatient setting.    ---Tobacco use Patient has been smoking cigarettes for over thirty years, recently abstained for about 53mo but then resumed 53mo ago. Previously used 1ppd and currently smokes about five cigarettes per day. Nicotine patches have been somewhat helpful in the past. Understands that tobacco use places her at higher risk for strokes, she has expressed the desire to quit entirely. Upon arrival, chest radiograph was unremarkable. Discharged with nicotine patch and encouragement to stop  smoking.    ---Prediabetes Patient has history of prediabetes dating back to at least 2021 per electronic medical record. Her A1c has been gradually increasing since then and is currently 6.3. Recommend rechecking A1c in 11-2022.       Discharge Exam:    BP (!) 145/109 (BP Location: Left Arm)   Pulse 82   Temp 98.4 F (36.9 C) (Oral)   Resp 16   Ht 5\' 6"  (1.676 m)   Wt 112.1 kg   SpO2 98%   BMI 39.89 kg/m   Subjective: Patient reports feeling good this morning and is eager to return home. Her vision has remained the same since admission. Denies pain and any new symptoms including weakness. Discussed plan to discharge later today and arrange close outpatient follow-up for warfarin dose adjustments. Reiterated importance of taking warfarin regularly.    General:                       awake and alert, lying comfortably in bed, cooperative, not in acute distress Skin:                             warm and dry, intact without any obvious lesions or scars, no rashes Eyes:                            extraocular movements intact Lungs:                          normal respiratory effort, breathing unlabored, symmetrical chest rise Cardiac:                        regular rate and rhythm, normal S1 and S2, capillary no pitting edema  Abdomen:                     soft and non-distended, normoactive bowel sounds, no tenderness to palpation or guarding Musculoskeletal:          motor strength 5/5 in LUE-LLE and 4/5 RUE-RLE Neurologic:                   oriented to person-place-time, right homonomous hemianopsia, CN II-XI intact with diminished sensation across CN V2 distribution on L, sensation to light touch intact and symmetric across all four extremities Psychiatric:                   optimistic mood with congruent affect, intelligible speech with [place] accent     Pertinent Labs, Studies, and Procedures:   Labs:     Latest Ref Rng & Units 08/24/2022    4:35 AM 08/23/2022    7:26 AM 08/22/2022    5:20 AM  CBC  WBC 4.0 - 10.5 K/uL 7.1  8.1  8.6   Hemoglobin 12.0 - 15.0 g/dL 81.1  91.4  78.2   Hematocrit 36.0 - 46.0 % 39.2  42.5  39.4   Platelets 150 - 400 K/uL 404  446  410       Latest Ref Rng &  Units 08/23/2022    7:26 AM 08/22/2022    5:20 AM 08/21/2022    5:55 PM  CMP  Glucose 70 - 99 mg/dL 956  213  086   BUN 6 - 20 mg/dL 5  5  8    Creatinine 0.44 -  1.00 mg/dL 4.09  8.11  9.14   Sodium 135 - 145 mmol/L 136  136  139   Potassium 3.5 - 5.1 mmol/L 3.8  4.0  3.9   Chloride 98 - 111 mmol/L 103  103  104   CO2 22 - 32 mmol/L 23  24    Calcium 8.9 - 10.3 mg/dL 9.3  9.3      ______________________  Imaging:  VAS [place] LOWER EXTREMITY VENOUS (DVT) Result Date: 08/22/2022 Summary: RIGHT: - There is no evidence of deep vein thrombosis in the lower extremity.  - No cystic structure found in the popliteal fossa.  LEFT: - There is no evidence of deep vein thrombosis in the lower extremity.  - No cystic structure found in the popliteal fossa.    ECHOCARDIOGRAM COMPLETE BUBBLE STUDY Result Date: 08/22/2022 IMPRESSIONS  1. Left ventricular ejection fraction, by estimation, is 65 to 70%. The left ventricle has normal function. The left ventricle has no regional wall motion abnormalities. There is mild concentric left ventricular hypertrophy. Left ventricular diastolic parameters are consistent with Grade I diastolic dysfunction (impaired relaxation).  2. Right ventricular systolic function is normal. The right ventricular size is normal.  3. The mitral valve is normal in structure. No evidence of mitral valve regurgitation.  4. The aortic valve was not well visualized. Aortic valve regurgitation is not visualized. No aortic stenosis is present. Comparison(s): No significant change from prior study. Prior images reviewed side by side.   MR BRAIN WO CONTRAST Result Date: 08/21/2022 IMPRESSION: 1. Acute/early subacute cortical ischemia throughout the left occipital lobe. No acute hemorrhage or mass effect. 2. Multifocal chronic microhemorrhage and subarachnoid siderosis. 3. Old infarcts of the right cerebellum and left frontal lobe.   DG CHEST PORT 1 VIEW Result Date: 08/21/2022 IMPRESSION: No active  disease.   CT ANGIO HEAD NECK W WO CM Result Date: 08/21/2022 IMPRESSION: 1. No intracranial large vessel occlusion or significant stenosis. 2. No hemodynamically significant stenosis in the neck.  CT HEAD CODE STROKE WO CONTRAST Result Date: 08/21/2022 IMPRESSION: New hypodensity and loss of gray-white differentiation in the left occipital and parietal lobes, concerning for acute infarct. No acute intracranial hemorrhage.    ______________________  Procedures:  none  ______________________   Discharge Instructions:  Discharge Instructions     Ambulatory referral to Neurology   Complete by: As directed    Follow up with Shanda Bumps in 4 weeks.  Thanks   Diet - low sodium heart healthy   Complete by: As directed    Discharge instructions   Complete by: As directed    Maria Porter,  It was a pleasure taking care of you while you were in the hospital. Your vision loss was caused by another stroke that affected the left side of your brain. We changed your anticoagulation medication from apixaban to warfarin and will continue to adjust your dose over the next few days.  After you return home, please continue to take your cholesterol medications and warfarin daily. This will help prevent you from experiencing another stroke. We are providing you with a medication called enoxaparin to take only while we finalize your warfarin dose. It is also important to follow up with neurology and a pharmacist who can help adjust these blood-thinning medications. Additionally, you will need to avoid certain foods such as leafy greens that can interact with warfarin.  Please return to the emergency department if you experience any symptoms concerning for a new stroke including weakness or further vision loss.  Increase activity slowly   Complete by: As directed       Maria Porter,  It was a pleasure taking care of you while you were in the hospital. Your vision loss was caused by another stroke that  affected the left side of your brain. We changed your anticoagulation medication from apixaban to warfarin and will continue to adjust your dose over the next few days.  After you return home, please continue to take your cholesterol medications and warfarin daily. This will help prevent you from experiencing another stroke. We are providing you with a medication called enoxaparin to take only while we finalize your warfarin dose. It is also important to follow up with neurology and a pharmacist who can help adjust these blood-thinning medications. Additionally, you will need to avoid certain foods such as leafy greens that can interact with warfarin.  Please return to the emergency department if you experience any symptoms concerning for a new stroke including weakness or further vision loss.    All the best,  Signed: Lajuana Ripple, MD Internal Medicine PGY-1 Pager [phone]

## 2022-08-24 NOTE — TOC Transition Note (Signed)
Transition of Care [facility]) - CM/SW Discharge Note   Patient Details  Name: Maria Porter MRN: [id number] Date of Birth: 09-May-1975  Transition of [facility]) CM/SW Contact:  Kermit Balo, RN Phone Number: 08/24/2022, 12:13 PM   Clinical Narrative:    Pt is discharging home to daughters in [place]. [facility] set up for [facility] and verified.  Pt has transportation home today.  Her first coumadin appt is in [place] but she will work on getting that changed after that visit.   Final next level of care: Home w Home Health Services Barriers to Discharge: No Barriers Identified   Patient Goals and CMS Choice CMS Medicare.gov Compare Post Acute Care list provided to:: Patient Choice offered to / list presented to : Patient  Discharge Placement                         Discharge Plan and Services Additional resources added to the After Visit Summary for                            [facility] Arranged: PT, OT, Speech Therapy HH Agency: Other - See comment Shellia Carwin [facility]) Date HH Agency Contacted: 08/23/22   Representative spoke with at [facility] Agency: Mia  Social Determinants of Health (SDOH) Interventions SDOH Screenings   Food Insecurity: No Food Insecurity (08/21/2022)  Housing: Low Risk  (08/21/2022)  Transportation Needs: No Transportation Needs (08/21/2022)  Utilities: Not At Risk (08/21/2022)  Depression (PHQ2-9): Low Risk  (08/09/2022)  Financial Resource Strain: High Risk (11/15/2021)  Tobacco Use: High Risk (08/22/2022)     Readmission Risk Interventions     No data to display

## 2022-08-24 NOTE — Care Management Important Message (Signed)
Important Message  Patient Details  Name: Maria Porter MRN: [id number] Date of Birth: Oct 11, 1975   Medicare Important Message Given:  Yes     Nakeeta Sebastiani 08/24/2022, 1:21 PM

## 2022-08-27 ENCOUNTER — Telehealth: Payer: Self-pay

## 2022-08-27 ENCOUNTER — Ambulatory Visit (INDEPENDENT_AMBULATORY_CARE_PROVIDER_SITE_OTHER): Payer: Medicare HMO | Admitting: Pharmacist

## 2022-08-27 ENCOUNTER — Telehealth: Payer: Self-pay | Admitting: Adult Health

## 2022-08-27 ENCOUNTER — Ambulatory Visit (INDEPENDENT_AMBULATORY_CARE_PROVIDER_SITE_OTHER): Payer: Medicare HMO

## 2022-08-27 DIAGNOSIS — I639 Cerebral infarction, unspecified: Secondary | ICD-10-CM

## 2022-08-27 DIAGNOSIS — I6930 Unspecified sequelae of cerebral infarction: Secondary | ICD-10-CM

## 2022-08-27 DIAGNOSIS — Z7901 Long term (current) use of anticoagulants: Secondary | ICD-10-CM

## 2022-08-27 DIAGNOSIS — Z86718 Personal history of other venous thrombosis and embolism: Secondary | ICD-10-CM

## 2022-08-27 DIAGNOSIS — D6859 Other primary thrombophilia: Secondary | ICD-10-CM

## 2022-08-27 LAB — MULTIPLE MYELOMA PANEL, SERUM
Albumin SerPl Elph-Mcnc: 3.3 g/dL (ref 2.9–4.4)
Albumin/Glob SerPl: 0.9 (ref 0.7–1.7)
Alpha 1: 0.3 g/dL (ref 0.0–0.4)
Alpha2 Glob SerPl Elph-Mcnc: 1 g/dL (ref 0.4–1.0)
B-Globulin SerPl Elph-Mcnc: 1.3 g/dL (ref 0.7–1.3)
Gamma Glob SerPl Elph-Mcnc: 1.6 g/dL (ref 0.4–1.8)
Globulin, Total: 4.1 g/dL — ABNORMAL HIGH (ref 2.2–3.9)
IgA: 187 mg/dL (ref 87–352)
IgG (Immunoglobin G), Serum: 1557 mg/dL (ref 586–1602)
IgM (Immunoglobulin M), Srm: 87 mg/dL (ref 26–217)
Total Protein ELP: 7.4 g/dL (ref 6.0–8.5)

## 2022-08-27 LAB — POCT INR: INR: 3.9 — AB (ref 2.0–3.0)

## 2022-08-27 LAB — MISC LABCORP TEST (SEND OUT): Labcorp test code: 500309

## 2022-08-27 NOTE — Patient Instructions (Signed)
Patient instructed to take medications as defined in the Anti-coagulation Track section of this encounter.  Patient instructed to take today's dose.  Patient instructed to take 1/2 of your 5 milligram strength warfarin tablet on Mondays and Thursdays; ALL OTHER DAYS, take one (1) tablet at 4PM.  Patient verbalized understanding of these instructions.

## 2022-08-27 NOTE — Transitions of Care (Post Inpatient/ED Visit) (Signed)
   08/27/2022  Name: Maria Porter MRN: [id number] DOB: [birth year]  Today's TOC FU Call Status: Today's TOC FU Call Status:: Successful TOC FU Call Competed TOC FU Call Complete Date: 08/27/22  Transition Care Management Follow-up Telephone Call Date of Discharge: 08/24/22 Discharge Facility: [facility]) Type of Discharge: Inpatient Admission How have you been since you were released from the hospital?: Better Any questions or concerns?: No  Items Reviewed: Did you receive and understand the discharge instructions provided?: Yes Medications obtained and verified?: Yes (Medications Reviewed) Any new allergies since your discharge?: Yes Dietary orders reviewed?: Yes Do you have support at home?: Yes People in Home: child(ren), adult  Home Care and Equipment/Supplies: Were Home Health Services Ordered?: Yes Name of Home Health Agency:: unknown Has Agency set up a time to come to your home?: Yes First Home Health Visit Date: 09/01/22 Any new equipment or medical supplies ordered?: NA  Functional Questionnaire: Do you need assistance with bathing/showering or dressing?: No Do you need assistance with meal preparation?: No Do you need assistance with eating?: No Do you have difficulty maintaining continence: No Do you need assistance with getting out of bed/getting out of a chair/moving?: No Do you have difficulty managing or taking your medications?: No  Follow up appointments reviewed: PCP Follow-up appointment confirmed?: Yes Date of PCP follow-up appointment?: 08/29/22 Follow-up Provider: [facility] Follow-up appointment confirmed?: NA Do you need transportation to your follow-up appointment?: No Do you understand care options if your condition(s) worsen?: Yes-patient verbalized understanding    SIGNATURE Karena Addison, LPN [facility] Nurse Health Advisor Direct Dial [phone]

## 2022-08-27 NOTE — Progress Notes (Signed)
Anticoagulation Management Maria Porter is a 47 y.o. female who reports to the clinic for monitoring of warfarin treatment.    Indication: CVA, DVT, and long term current use of oral anticoagulant with target range INR 2.0 - 3.0. History of CVA, recent new acute CVA, History of DVT; Primary hypercoagulable state.    Duration: indefinite Supervising physician: Debe Coder  Anticoagulation Clinic Visit History: Patient does not report signs/symptoms of bleeding or thromboembolism  Other recent changes: No diet, medications, lifestyle except as noted in patient findings.  Anticoagulation Episode Summary     Current INR goal:  2.0-3.0  TTR:  --  Next INR check:    INR from last check:    Most recent INR:   3.9 (08/27/2022)  Weekly max warfarin dose:    Target end date:  Indefinite  INR check location:    Preferred lab:    Send INR reminders to:     Indications   History of CVA with residual deficit [I69.30] Acute deep vein thrombosis of right iliac vein (Resolved) [I82.421] Acute ischemic right MCA stroke (Resolved) [I63.511] Acute CVA (cerebrovascular accident) [I63.9] Primary hypercoagulable state [D68.59] Long term (current) use of anticoagulants [Z79.01]        Comments:           No Known Allergies  Current Outpatient Medications:    acetaminophen (TYLENOL) 500 MG tablet, Take 1,000 mg by mouth daily as needed for moderate pain, fever or headache., Disp: , Rfl:    albuterol (PROVENTIL) (2.5 MG/3ML) 0.083% nebulizer solution, Take 3 mLs (2.5 mg total) by nebulization every 4 (four) hours as needed for wheezing or shortness of breath., Disp: 75 mL, Rfl: 2   atorvastatin (LIPITOR) 80 MG tablet, Take 1 tablet (80 mg total) by mouth daily. (Patient taking differently: Take 80 mg by mouth at bedtime.), Disp: 90 tablet, Rfl: 1   cetirizine (ZYRTEC) 10 MG tablet, TAKE 1 TABLET BY MOUTH EVERY DAY (Patient taking differently: Take 10 mg by mouth at bedtime.), Disp: 30 tablet,  Rfl: 2   levothyroxine (SYNTHROID) 175 MCG tablet, Take 1 tablet (175 mcg total) by mouth daily at 6 (six) AM., Disp: 90 tablet, Rfl: 2   nicotine (NICODERM CQ - DOSED IN MG/24 HR) 7 mg/24hr patch, Place 1 patch (7 mg total) onto the skin daily., Disp: 30 patch, Rfl: 1   nicotine polacrilex (NICORETTE) 2 MG gum, Take 1 each (2 mg total) by mouth as needed for smoking cessation., Disp: 100 tablet, Rfl: 0   pantoprazole (PROTONIX) 40 MG tablet, Take 1 tablet (40 mg total) by mouth daily., Disp: 90 tablet, Rfl: 1   warfarin (COUMADIN) 5 MG tablet, Take 1 tablet (5 mg total) by mouth daily at 4 PM., Disp: 30 tablet, Rfl: 0   albuterol (VENTOLIN HFA) 108 (90 Base) MCG/ACT inhaler, Inhale 1-2 puffs into the lungs every 6 (six) hours as needed for wheezing or shortness of breath. (Patient not taking: Reported on 08/21/2022), Disp: 18 each, Rfl: 3   enoxaparin (LOVENOX) 120 MG/0.8ML injection, Inject 0.74 mLs (110 mg total) into the skin every 12 (twelve) hours for 8 days. (Patient not taking: Reported on 08/27/2022), Disp: 12.8 mL, Rfl: 0   ezetimibe (ZETIA) 10 MG tablet, Take 1 tablet (10 mg total) by mouth daily. (Patient not taking: Reported on 08/27/2022), Disp: 90 tablet, Rfl: 3   fluticasone (FLONASE) 50 MCG/ACT nasal spray, SPRAY 1 SPRAY INTO BOTH NOSTRILS DAILY. (Patient not taking: Reported on 08/21/2022), Disp: 16 mL, Rfl: 2  Past Medical History:  Diagnosis Date   Acute deep vein thrombosis of right iliac vein 09/14/2020   Stroke    Social History   Socioeconomic History   Marital status: Single    Spouse name: Not on file   Number of children: Not on file   Years of education: Not on file   Highest education level: Not on file  Occupational History   Not on file  Tobacco Use   Smoking status: Every Day    Packs/day: .5    Types: Cigarettes    Start date: 03/07/2020   Smokeless tobacco: Never   Tobacco comments:        08/22/2022 patient smokes 5 cigarettes daily  Substance and Sexual  Activity   Alcohol use: Not Currently   Drug use: Not on file   Sexual activity: Not on file  Other Topics Concern   Not on file  Social History Narrative   Lives with sister   Left Handed   Drinks 2-3 cups caffeine daily   Social Determinants of Health   Financial Resource Strain: High Risk (11/15/2021)   Overall Financial Resource Strain (CARDIA)    Difficulty of Paying Living Expenses: Very hard  Food Insecurity: No Food Insecurity (08/21/2022)   Hunger Vital Sign    Worried About Running Out of Food in the Last Year: Never true    Ran Out of Food in the Last Year: Never true  Transportation Needs: No Transportation Needs (08/21/2022)   PRAPARE - Administrator, Civil Service (Medical): No    Lack of Transportation (Non-Medical): No  Physical Activity: Not on file  Stress: Not on file  Social Connections: Not on file   Family History  Problem Relation Age of Onset   Sleep apnea Neg Hx     ASSESSMENT Recent Results: The most recent result is correlated with 35 mg per week: Lab Results  Component Value Date   INR 3.9 (A) 08/27/2022   INR 1.5 (H) 08/24/2022   INR 1.1 08/23/2022    Anticoagulation Dosing:   INR today: Supratherapeutic  PLAN Weekly dose was decreased by 15% to 30 mg per week  Patient Instructions  Patient instructed to take medications as defined in the Anti-coagulation Track section of this encounter.  Patient instructed to take today's dose.  Patient instructed to take 1/2 of your 5 milligram strength warfarin tablet on Mondays and Thursdays; ALL OTHER DAYS, take one (1) tablet at 4PM.  Patient verbalized understanding of these instructions.  Patient advised to contact clinic or seek medical attention if signs/symptoms of bleeding or thromboembolism occur.  Patient verbalized understanding by repeating back information and was advised to contact me if further medication-related questions arise. Patient was also provided an information  handout.  Follow-up Return in 1 week (on 09/03/2022) for Follow up INR.  Elicia Lamp, PharmD, CPP  15 minutes spent face-to-face with the patient during the encounter. 50% of time spent on education, including signs/sx bleeding and clotting, as well as food and drug interactions with warfarin. 50% of time was spent on fingerprick POC INR sample collection,processing, results determination, and documentation in [email or web address].

## 2022-08-27 NOTE — Telephone Encounter (Signed)
Patient called in stated she is moving to [place], [place] and transferring her care to [facility]. Needs her records sent to that office fax number [phone].

## 2022-08-27 NOTE — Telephone Encounter (Signed)
Faxed records for patient, She is aware if she needs a referral we are unable to provide that, but her PCP can do this for her or she can have hospital reroute the referral that was sent to [place]. I let her know I thought it would be easier to go through PCP if this is needed and she can always get the recent hospital records sent to this new office as well. They are also able to see [facility] records through Care Everywhere.

## 2022-08-28 LAB — CUP PACEART REMOTE DEVICE CHECK
Date Time Interrogation Session: 20240420230057
Implantable Pulse Generator Implant Date: 20230501

## 2022-08-28 LAB — HGB FRACTIONATION CASCADE
Hgb A2: 2.5 % (ref 1.8–3.2)
Hgb A: 97.5 % (ref 96.4–98.8)
Hgb F: 0 % (ref 0.0–2.0)
Hgb S: 0 %

## 2022-08-29 ENCOUNTER — Ambulatory Visit (INDEPENDENT_AMBULATORY_CARE_PROVIDER_SITE_OTHER): Payer: Medicare HMO | Admitting: Student

## 2022-08-29 VITALS — BP 131/95 | HR 81 | Temp 97.5°F | Ht 66.0 in | Wt 246.0 lb

## 2022-08-29 DIAGNOSIS — F17210 Nicotine dependence, cigarettes, uncomplicated: Secondary | ICD-10-CM | POA: Diagnosis not present

## 2022-08-29 DIAGNOSIS — I6930 Unspecified sequelae of cerebral infarction: Secondary | ICD-10-CM

## 2022-08-29 DIAGNOSIS — R030 Elevated blood-pressure reading, without diagnosis of hypertension: Secondary | ICD-10-CM | POA: Diagnosis not present

## 2022-08-29 DIAGNOSIS — F17200 Nicotine dependence, unspecified, uncomplicated: Secondary | ICD-10-CM

## 2022-08-29 MED ORDER — LOSARTAN POTASSIUM 25 MG PO TABS: 25.0000 mg | ORAL_TABLET | Freq: Every day | ORAL | 3 refills | Status: DC

## 2022-08-29 NOTE — Patient Instructions (Signed)
I am glad you are doing well since discharge.   Please continue your current medications   I would recommend discussing you blood pressure with your new primary care doctor and if you need medication  I am glad you are not smoking any more. Please keep this up. You may be able to stop the patches after 6 weeks. If you are having cravings you may benefit from nicotine gum which I recommend you speak with you new doctor about.  We will have you sign a release of records and I will call the INR clinic at [facility] to set up follow for your coumadin.   I will call and update you on this once a I know more

## 2022-08-31 ENCOUNTER — Encounter: Payer: Self-pay | Admitting: Student

## 2022-08-31 DIAGNOSIS — R030 Elevated blood-pressure reading, without diagnosis of hypertension: Secondary | ICD-10-CM | POA: Insufficient documentation

## 2022-08-31 NOTE — Progress Notes (Signed)
Established Patient Office Visit  Subjective   Patient ID: Maria Porter, female    DOB: 1975-11-27  Age: 47 y.o. MRN: [id number]  Chief Complaint  Patient presents with  . Hospitalization Follow-up    Maria Porter is a 47 y.o. person living with a history listed below who presents to clinic for follow up of L PCA and MCA infaction. Please refer to problem based charting for further details and assessment and plan of current problem and chronic medical conditions.    Patient Active Problem List   Diagnosis Date Noted  . Elevated blood pressure reading 08/31/2022  . Long term (current) use of anticoagulants 08/27/2022  . Visual field defect 08/22/2022  . Primary hypercoagulable state (HCC) 08/22/2022  . Acute CVA (cerebrovascular accident) (HCC) 08/21/2022  . Blurry vision, bilateral 08/09/2022  . Urinary incontinence 02/22/2022  . Preoperative clearance 02/21/2022  . GERD (gastroesophageal reflux disease) 07/20/2021  . Healthcare maintenance 03/07/2021  . History of DVT (deep vein thrombosis) 10/06/2020  . Family history of premature coronary heart disease 09/14/2020  . OSA (obstructive sleep apnea) 09/14/2020  . Pre-diabetes 02/03/2020  . Subclinical hypothyroidism 02/03/2020  . Tobacco use disorder 01/27/2020  . Hyperlipemia 01/27/2020  . Obesity 01/27/2020  . History of CVA with residual deficit 01/26/2020  . Extrinsic asthma 10/09/2004     Review of Systems  Constitutional:  Negative for chills and fever.  Eyes:  Negative for photophobia and pain.  Cardiovascular:  Negative for chest pain and palpitations.  Gastrointestinal:  Negative for blood in stool and melena.  Genitourinary:  Negative for hematuria.  Neurological:  Positive for speech change (residual from prio CVA) and focal weakness (Residual from prior CVA).  Endo/Heme/Allergies:  Does not bruise/bleed easily.  All other systems reviewed and are negative.     Objective:     BP (!) 131/95 (BP  Location: Right Arm, Patient Position: Sitting, Cuff Size: Normal)   Pulse 81   Temp (!) 97.5 F (36.4 C) (Oral)   Ht 5\' 6"  (1.676 m)   Wt 246 lb (111.6 kg)   SpO2 99%   BMI 39.71 kg/m  BP Readings from Last 3 Encounters:  08/29/22 (!) 131/95  08/24/22 125/78  08/09/22 (!) 150/101      Physical Exam Constitutional:      Comments: Sitting in wheel chair with cane, in no acute distress  HENT:     Mouth/Throat:     Mouth: Mucous membranes are moist.     Pharynx: Oropharynx is clear.  Eyes:     Extraocular Movements: Extraocular movements intact.     Conjunctiva/sclera: Conjunctivae normal.     Pupils: Pupils are equal, round, and reactive to light.  Cardiovascular:     Rate and Rhythm: Normal rate and regular rhythm.     Pulses: Normal pulses.     Heart sounds: No murmur heard. Pulmonary:     Effort: Pulmonary effort is normal.     Breath sounds: No rhonchi or rales.  Abdominal:     General: Abdomen is flat. Bowel sounds are normal. There is no distension.     Palpations: Abdomen is soft.     Tenderness: There is no abdominal tenderness.  Musculoskeletal:        General: Normal range of motion.     Right lower leg: No edema.     Left lower leg: No edema.  Skin:    General: Skin is warm and dry.     Capillary Refill: Capillary refill takes  less than 2 seconds.  Neurological:     Mental Status: She is alert and oriented to person, place, and time.     Comments: Mild left facial droop, tongue deviates to the right, decreased peripheral vision on the R bilaterally . 4+5 weakness of the RUE and RLE  Psychiatric:        Mood and Affect: Mood normal.        Behavior: Behavior normal.     No results found for any visits on 08/29/22.     The ASCVD Risk score (Arnett DK, et al., 2019) failed to calculate for the following reasons:   The patient has a prior MI or stroke diagnosis    Assessment & Plan:   Problem List Items Addressed This Visit       Other    History of CVA with residual deficit - Primary    Patient presents for hospital follow-up of cryptogenic left PCA and MCA infarcts with right homonymous hemianopsia on 08/21/2022.  Patient reports she has been doing well since being home.  Hypercoagulability workup thus far has been unrevealing.  Fractionated hemoglobin and multiple myeloma within normal limits.  Plasminogen activator inhibitor pending repeat TTE negative.  Previously was on aspirin and Eliquis but transition to Coumadin given history of multiple CVAs with compliance on Eliquis.  INR elevated at 3.9 earlier this week.  INR goal of 2.5-3.5.  She plans to move in with daughter and [place], [place]. Plans to establish care at [facility] clinic has appointment with new PCP in June.  Will have INR checked at the anticoagulation clinic at [facility] as well.  She has signed a release form and I have signed the anticoagulation orders that was faxed from the facility.  -Continue aspirin and warfarin current dose is 25 mg weekly -Continue atorvastatin and Zetia -Continue to encourage cessation      Tobacco use disorder    No tobacco use sine recent hospitalization on 08/21/2022 for L PCA infarct. Is using OTC nicotine patches. Previously smoking 5 cigarettes daily. Suspect she getting more nicotine from patches than gum. May be able to transition to gum and taper off. Will have her discuss with new PCP.       Elevated blood pressure reading    BP elevated today  at 131/95, not on antihypertensives. May benefit from antihypertensives in setting of multiple CVAs. Will need to have BP assess with new PCP in [place].        No follow-ups on file.    Quincy Simmonds, MD

## 2022-08-31 NOTE — Assessment & Plan Note (Addendum)
Patient presents for hospital follow-up of cryptogenic left PCA and MCA infarcts with right homonymous hemianopsia on 08/21/2022.  Patient reports she has been doing well since being home.  Hypercoagulability workup thus far has been unrevealing.  Fractionated hemoglobin and multiple myeloma within normal limits.  Plasminogen activator inhibitor pending repeat TTE negative.  Previously was on aspirin and Eliquis but transition to Coumadin given history of multiple CVAs with compliance on Eliquis.  INR elevated at 3.9 earlier this week.  INR goal of 2.5-3.5.  She plans to move in with daughter and [place], [place]. Plans to establish care at [facility] clinic has appointment with new PCP in June.  Will have INR checked at the anticoagulation clinic at [facility] as well.  She has signed a release form and I have signed the anticoagulation orders that was faxed from the facility.  -Continue aspirin and warfarin current dose is 25 mg weekly -Continue atorvastatin and Zetia -Continue to encourage cessation

## 2022-08-31 NOTE — Assessment & Plan Note (Signed)
No tobacco use sine recent hospitalization on 08/21/2022 for L PCA infarct. Is using OTC nicotine patches. Previously smoking 5 cigarettes daily. Suspect she getting more nicotine from patches than gum. May be able to transition to gum and taper off. Will have her discuss with new PCP.

## 2022-08-31 NOTE — Assessment & Plan Note (Signed)
BP elevated today  at 131/95, not on antihypertensives. May benefit from antihypertensives in setting of multiple CVAs. Will need to have BP assess with new PCP in [place].

## 2022-09-05 ENCOUNTER — Other Ambulatory Visit: Payer: Medicare HMO

## 2022-09-11 NOTE — Progress Notes (Signed)
Internal Medicine Clinic Attending ? ?Case discussed with Dr. Liang  At the time of the visit.  We reviewed the resident?s history and exam and pertinent patient test results.  I agree with the assessment, diagnosis, and plan of care documented in the resident?s note. ? ?

## 2022-09-13 ENCOUNTER — Telehealth: Payer: Self-pay | Admitting: Student

## 2022-09-13 NOTE — Telephone Encounter (Signed)
Contacted Novella Olive to schedule their annual wellness visit. Appointment made for 09/26/2022.   [facility] Care Guide [facility] AWV TEAM Direct Dial: [phone]

## 2022-09-26 ENCOUNTER — Ambulatory Visit: Payer: Medicare HMO

## 2022-09-28 LAB — CUP PACEART REMOTE DEVICE CHECK
Date Time Interrogation Session: 20240523230351
Implantable Pulse Generator Implant Date: 20230501

## 2022-10-02 ENCOUNTER — Ambulatory Visit (INDEPENDENT_AMBULATORY_CARE_PROVIDER_SITE_OTHER): Payer: Medicare HMO

## 2022-10-02 DIAGNOSIS — I639 Cerebral infarction, unspecified: Secondary | ICD-10-CM | POA: Diagnosis not present

## 2022-10-03 NOTE — Progress Notes (Signed)
[facility] Summary Report / Loop Recorder 

## 2022-10-29 NOTE — Progress Notes (Signed)
[facility] Summary Report / Loop Recorder 

## 2022-10-31 LAB — CUP PACEART REMOTE DEVICE CHECK
Date Time Interrogation Session: 20240625230902
Implantable Pulse Generator Implant Date: 20230501

## 2022-11-05 ENCOUNTER — Ambulatory Visit (INDEPENDENT_AMBULATORY_CARE_PROVIDER_SITE_OTHER): Payer: Medicare HMO

## 2022-11-05 DIAGNOSIS — I639 Cerebral infarction, unspecified: Secondary | ICD-10-CM | POA: Diagnosis not present

## 2022-11-26 NOTE — Progress Notes (Signed)
[facility] Summary Report / Loop Recorder 

## 2022-12-10 ENCOUNTER — Ambulatory Visit: Payer: Medicare HMO

## 2022-12-10 DIAGNOSIS — I639 Cerebral infarction, unspecified: Secondary | ICD-10-CM

## 2022-12-25 NOTE — Progress Notes (Signed)
[facility] Summary Report / Loop Recorder 

## 2023-01-14 LAB — CUP PACEART REMOTE DEVICE CHECK
Date Time Interrogation Session: 20240906230525
Implantable Pulse Generator Implant Date: 20230501

## 2023-03-27 ENCOUNTER — Other Ambulatory Visit: Payer: Self-pay | Admitting: Student

## 2023-12-18 IMAGING — DX DG THORACIC SPINE 2V
3 series · 3 of 3 positions shown · non-contrast
Comparison: None.

CLINICAL DATA: Back pain

EXAM:
THORACIC SPINE 2 VIEWS

[t-spine ap]
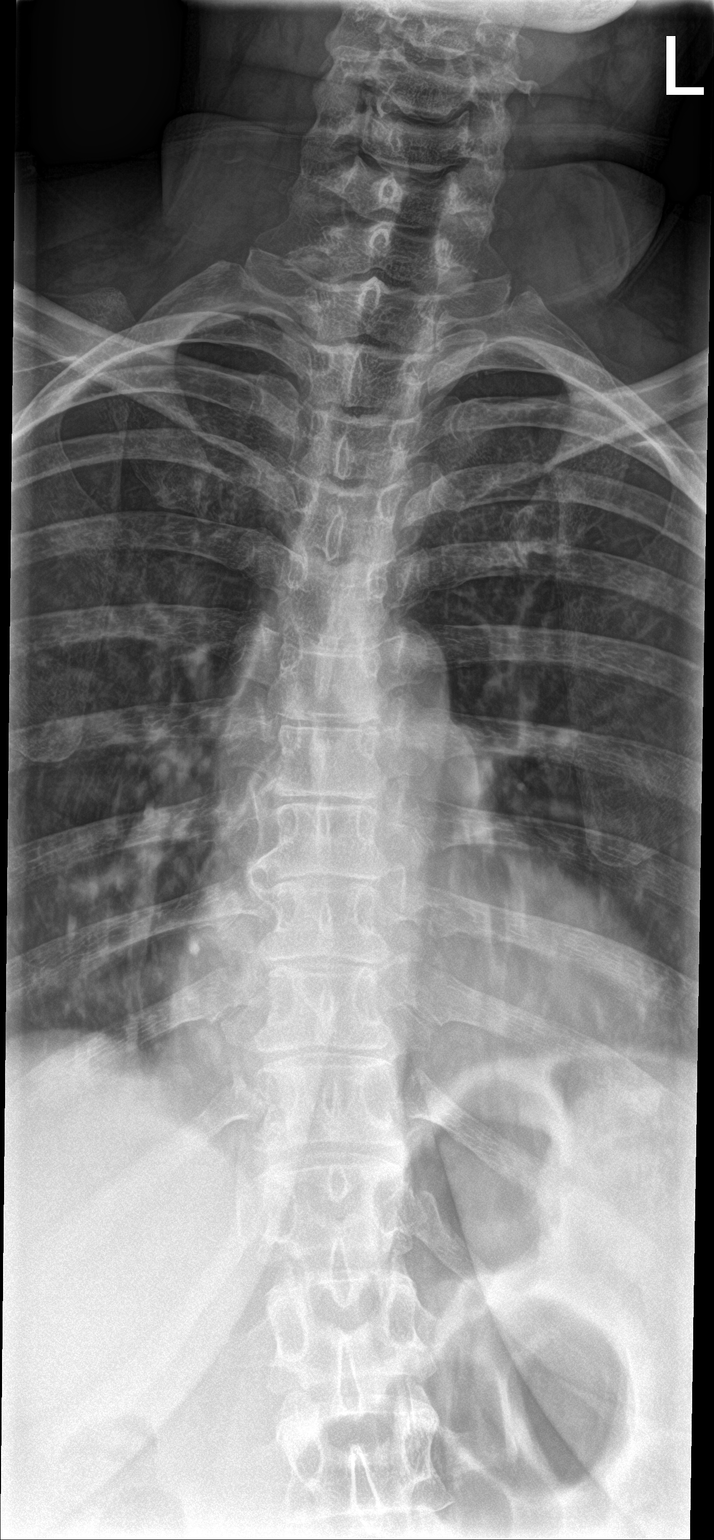

[t-spine lat]
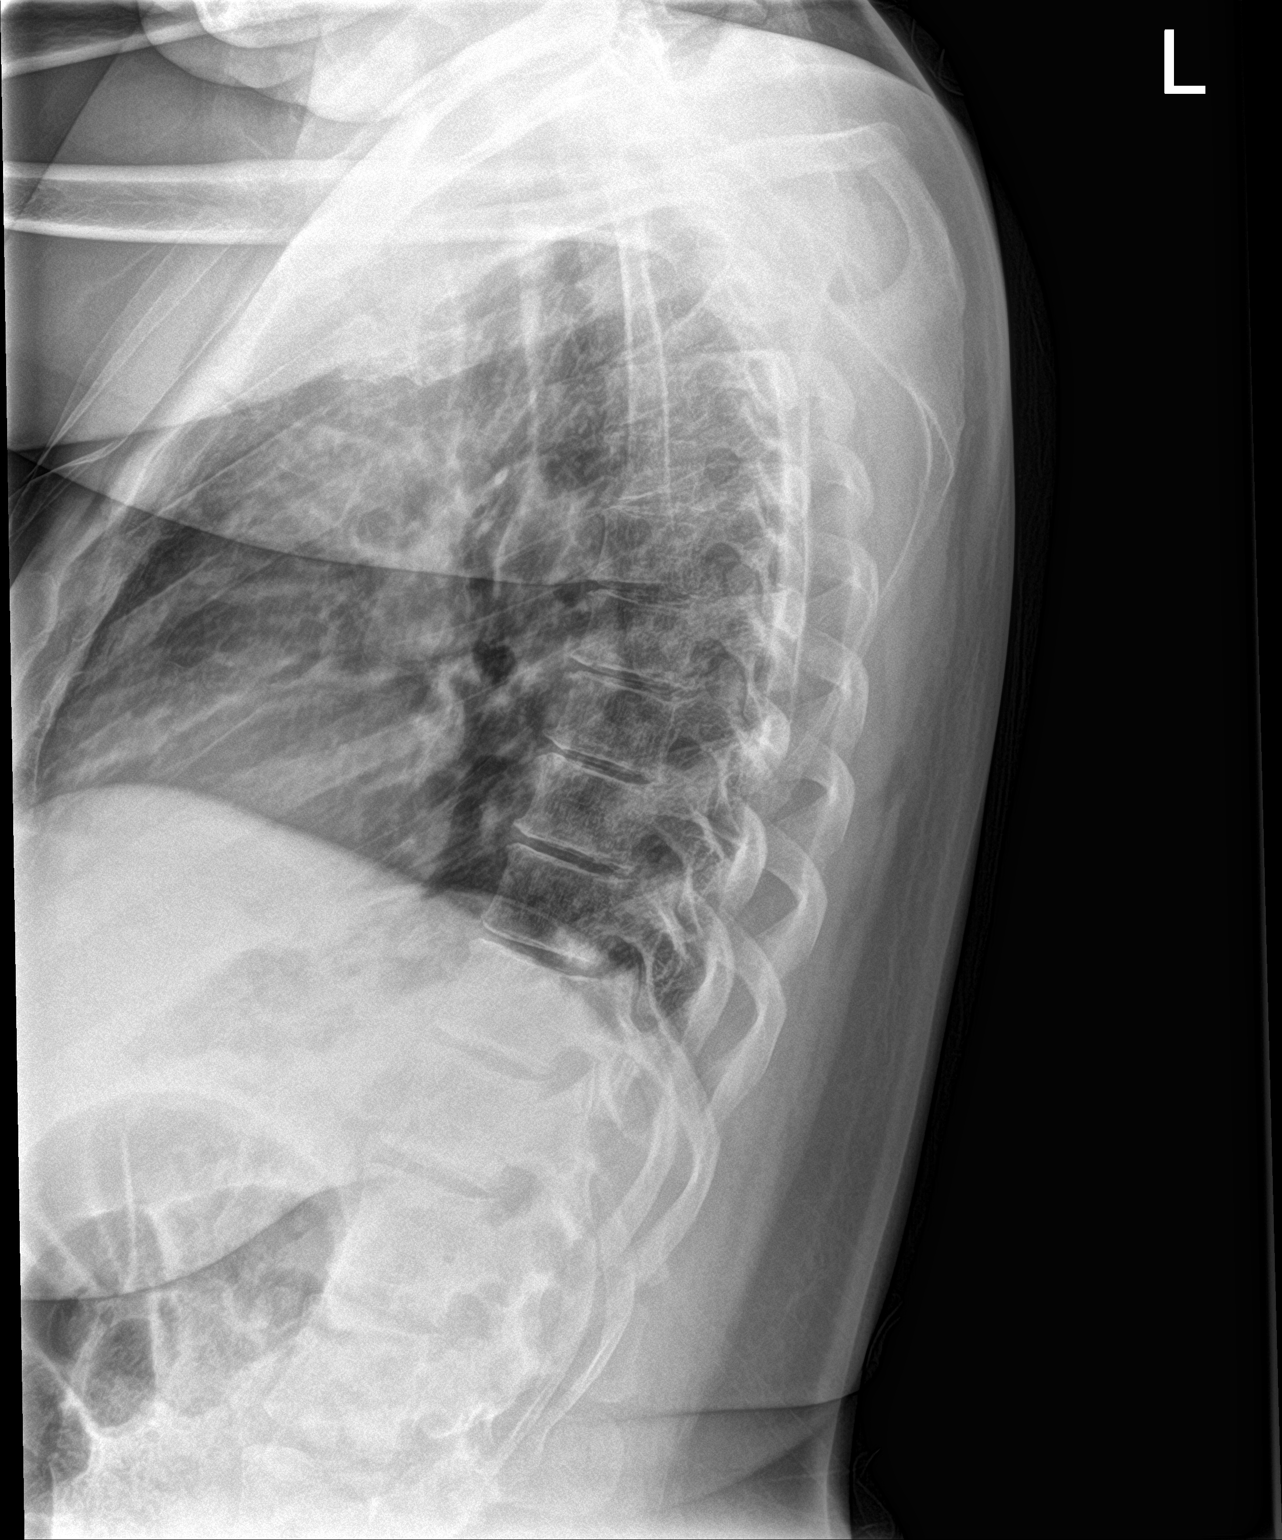

[t-spine swimmers]
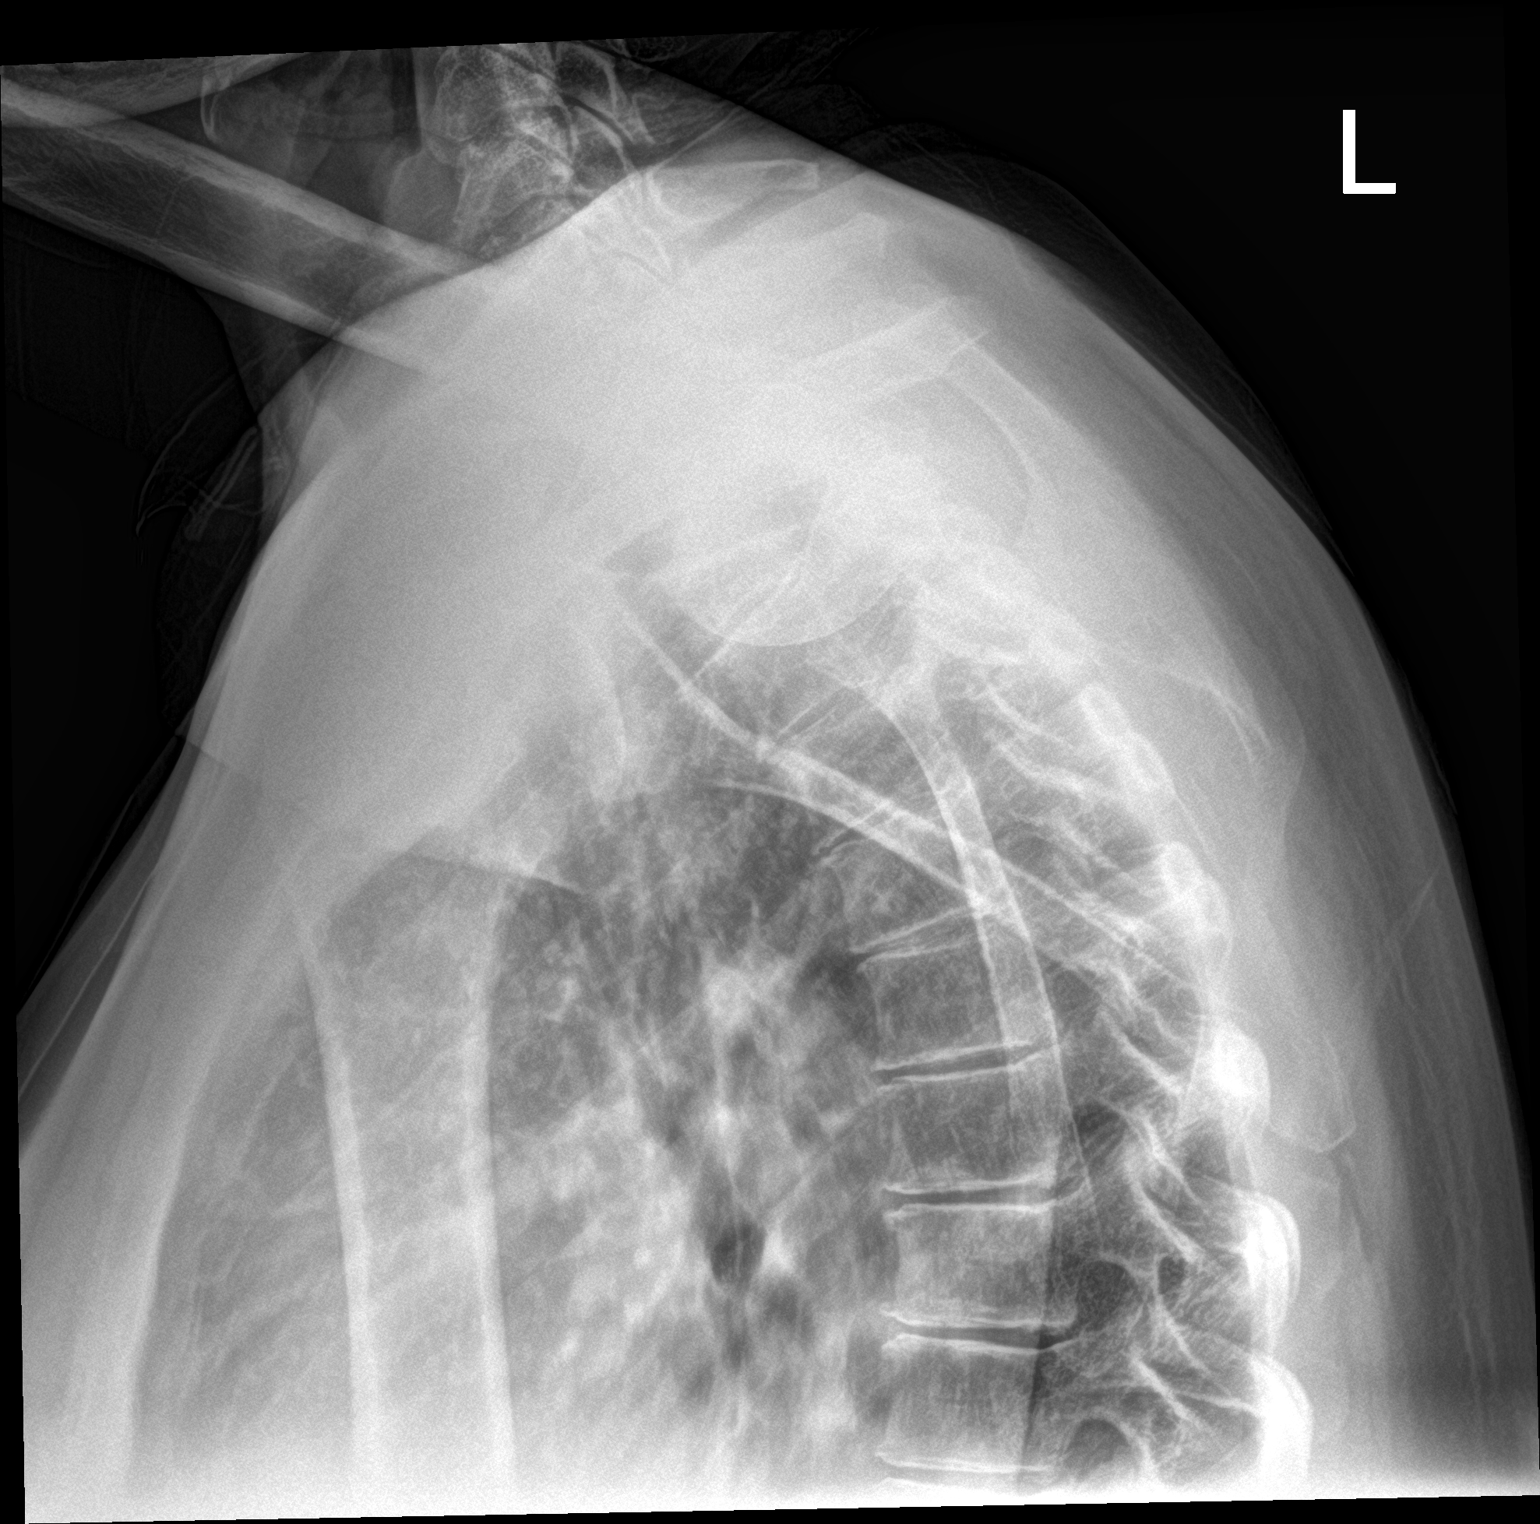

[3 of 3 positions shown; findings below may reference images not displayed]

FINDINGS: There is no evidence of thoracic spine fracture. Alignment is
normal. Minimal multilevel degenerative changes.
IMPRESSION: No evidence of thoracic spine fracture. Minimal multilevel
degenerative changes.

## 2023-12-18 IMAGING — DX DG LUMBAR SPINE COMPLETE 4+V
5 series · 5 of 5 positions shown · non-contrast
Comparison: None.

CLINICAL DATA: Back pain

EXAM:
LUMBAR SPINE - COMPLETE 4+ VIEW

[l-spine ap]
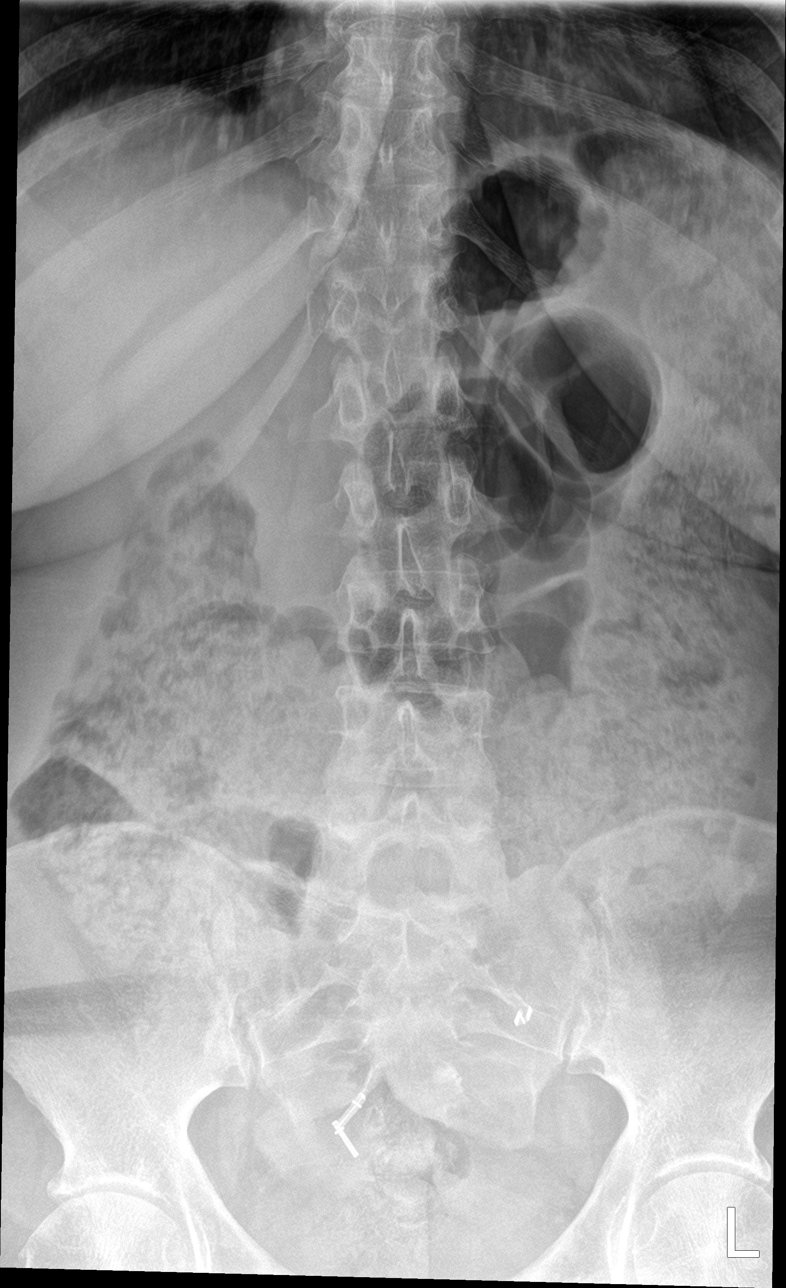

[l-spine obl (1 of 2)]
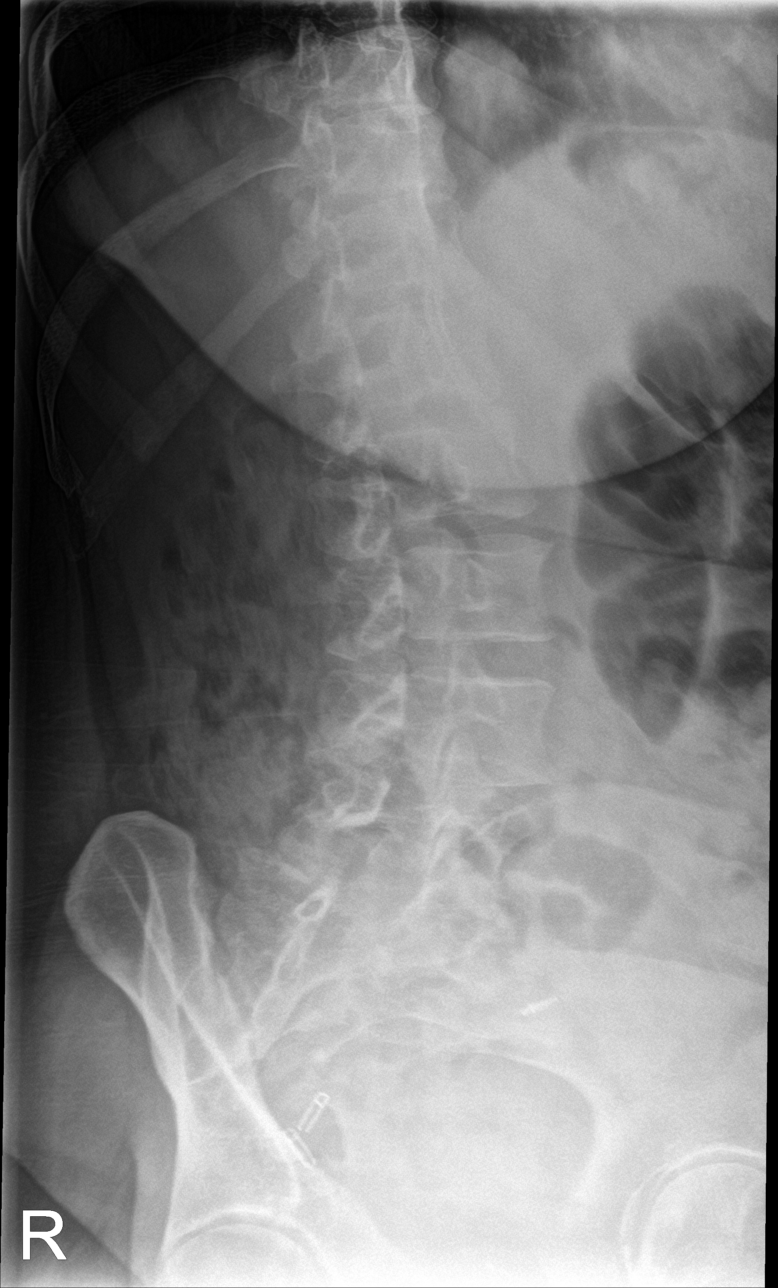

[l-spine obl (2 of 2)]
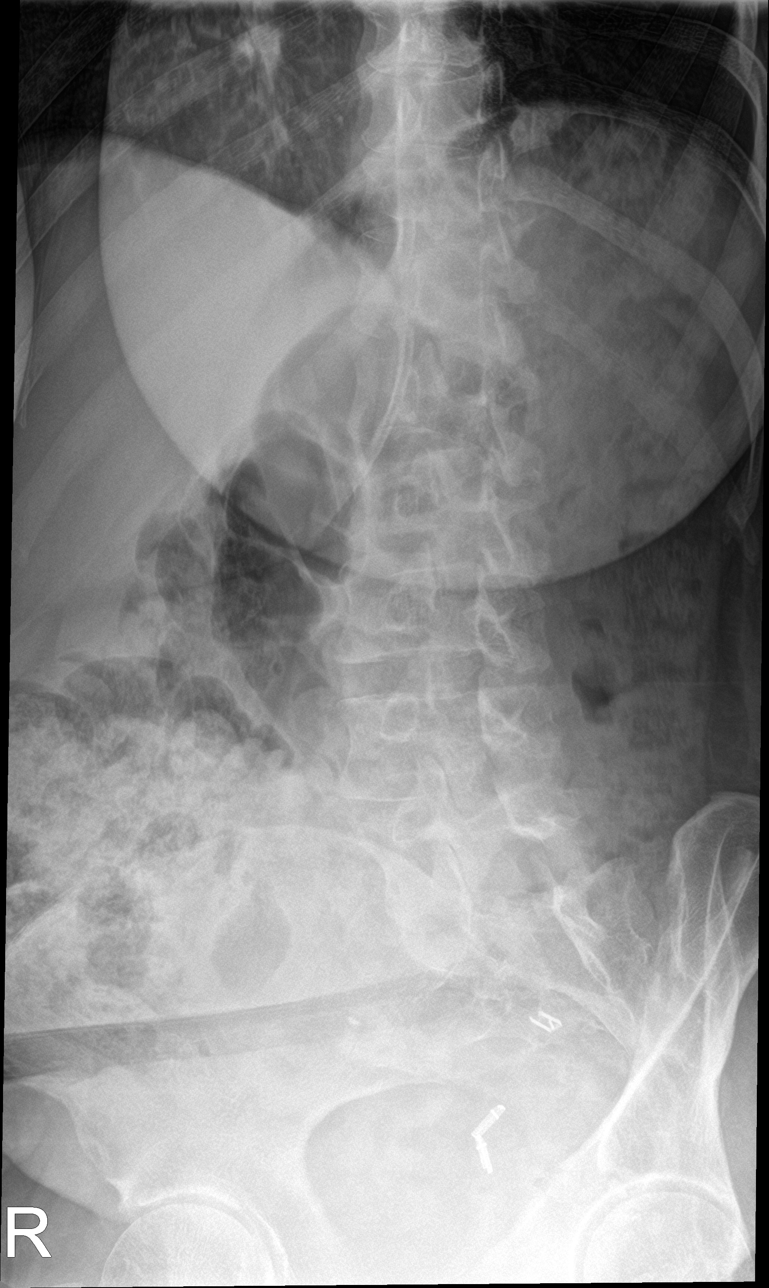

[l-spine lat]
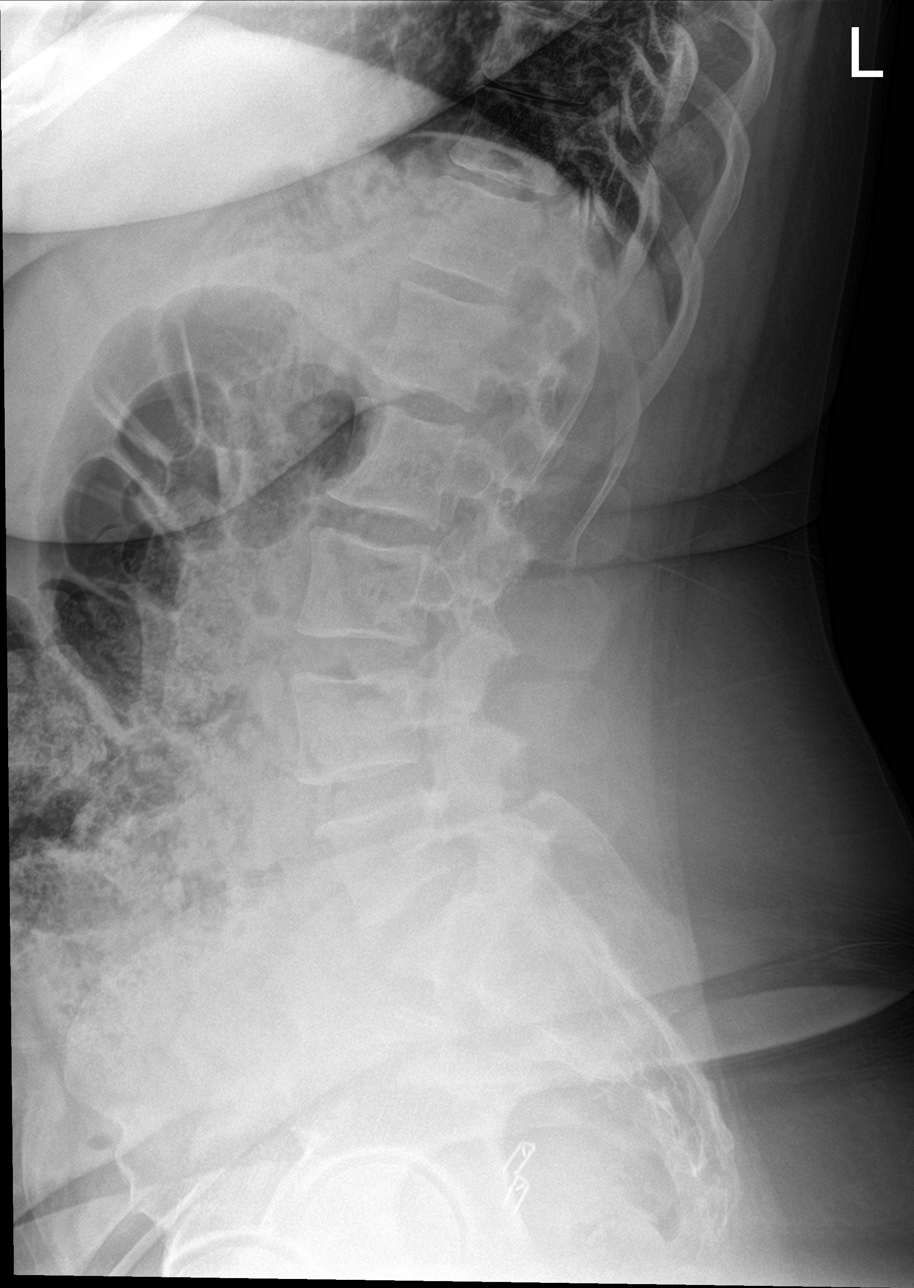

[l-spine spot]
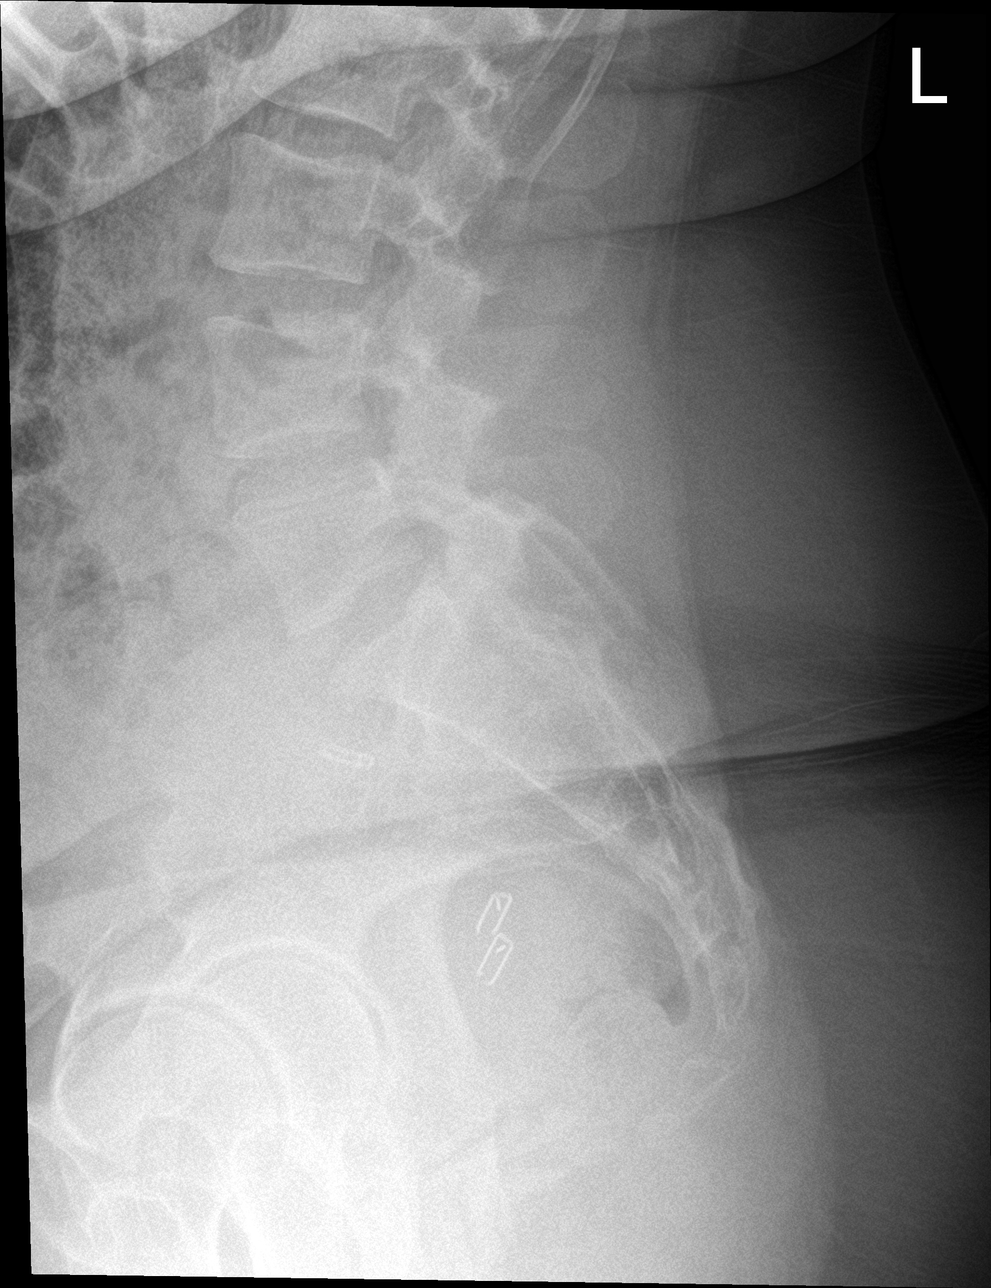

[5 of 5 positions shown; findings below may reference images not displayed]

FINDINGS: There are 5 non-rib-bearing lumbar vertebrae. There is no evidence
of lumbar spine fracture. Relatively preserved disc heights. Mild
lower lumbar predominant facet arthropathy. There are surgical clips
overlying the pelvis. There is mild right hip osteoarthritis.
IMPRESSION: No evidence of lumbar spine fracture. Mild lower lumbar predominant
facet arthropathy. Relatively preserved disc heights.
